# Patient Record
Sex: Female | Born: 1935 | Race: Black or African American | Hispanic: No | Smoking: Former smoker
Health system: Southern US, Community
[De-identification: ages and names within clinical notes are randomized; demographics above are authoritative.]

## PROBLEM LIST (undated history)

## (undated) DIAGNOSIS — K8050 Calculus of bile duct without cholangitis or cholecystitis without obstruction: Secondary | ICD-10-CM

## (undated) DIAGNOSIS — M1990 Unspecified osteoarthritis, unspecified site: Secondary | ICD-10-CM

## (undated) DIAGNOSIS — G8929 Other chronic pain: Secondary | ICD-10-CM

## (undated) DIAGNOSIS — M25561 Pain in right knee: Secondary | ICD-10-CM

## (undated) DIAGNOSIS — N189 Chronic kidney disease, unspecified: Secondary | ICD-10-CM

## (undated) DIAGNOSIS — R252 Cramp and spasm: Secondary | ICD-10-CM

## (undated) DIAGNOSIS — M109 Gout, unspecified: Secondary | ICD-10-CM

## (undated) DIAGNOSIS — M549 Dorsalgia, unspecified: Secondary | ICD-10-CM

## (undated) DIAGNOSIS — K219 Gastro-esophageal reflux disease without esophagitis: Secondary | ICD-10-CM

## (undated) DIAGNOSIS — M4800 Spinal stenosis, site unspecified: Secondary | ICD-10-CM

## (undated) DIAGNOSIS — M25562 Pain in left knee: Secondary | ICD-10-CM

## (undated) DIAGNOSIS — G5603 Carpal tunnel syndrome, bilateral upper limbs: Secondary | ICD-10-CM

## (undated) DIAGNOSIS — I10 Essential (primary) hypertension: Secondary | ICD-10-CM

## (undated) HISTORY — PX: CARPAL TUNNEL RELEASE: SHX101

## (undated) HISTORY — DX: Chronic kidney disease, unspecified: N18.9

## (undated) HISTORY — PX: OTHER SURGICAL HISTORY: SHX169

## (undated) HISTORY — PX: WRIST ARTHROPLASTY: SHX1088

## (undated) HISTORY — PX: CHOLECYSTECTOMY: SHX55

## (undated) HISTORY — DX: Spinal stenosis, site unspecified: M48.00

## (undated) HISTORY — PX: CATARACT EXTRACTION: SUR2

## (undated) HISTORY — DX: Essential (primary) hypertension: I10

## (undated) HISTORY — PX: APPENDECTOMY: SHX54

## (undated) HISTORY — DX: Gastro-esophageal reflux disease without esophagitis: K21.9

## (undated) HISTORY — PX: BREAST BIOPSY: SHX20

---

## 2000-06-14 ENCOUNTER — Inpatient Hospital Stay (HOSPITAL_COMMUNITY): Admission: AD | Admit: 2000-06-14 | Discharge: 2000-06-20 | Payer: Self-pay | Admitting: Family Medicine

## 2000-06-14 ENCOUNTER — Encounter: Payer: Self-pay | Admitting: Family Medicine

## 2000-06-17 ENCOUNTER — Encounter: Payer: Self-pay | Admitting: Family Medicine

## 2000-06-17 ENCOUNTER — Encounter: Payer: Self-pay | Admitting: General Surgery

## 2000-09-29 ENCOUNTER — Inpatient Hospital Stay (HOSPITAL_COMMUNITY): Admission: EM | Admit: 2000-09-29 | Discharge: 2000-10-09 | Payer: Self-pay | Admitting: Emergency Medicine

## 2000-09-29 ENCOUNTER — Encounter: Payer: Self-pay | Admitting: *Deleted

## 2000-10-01 ENCOUNTER — Encounter: Payer: Self-pay | Admitting: Pulmonary Disease

## 2000-10-02 ENCOUNTER — Encounter: Payer: Self-pay | Admitting: *Deleted

## 2000-10-03 ENCOUNTER — Encounter: Payer: Self-pay | Admitting: Orthopedic Surgery

## 2000-10-09 ENCOUNTER — Inpatient Hospital Stay (HOSPITAL_COMMUNITY)
Admission: RE | Admit: 2000-10-09 | Discharge: 2000-10-15 | Payer: Self-pay | Admitting: Physical Medicine & Rehabilitation

## 2002-04-02 ENCOUNTER — Emergency Department (HOSPITAL_COMMUNITY): Admission: EM | Admit: 2002-04-02 | Discharge: 2002-04-03 | Payer: Self-pay | Admitting: Emergency Medicine

## 2002-04-14 ENCOUNTER — Ambulatory Visit (HOSPITAL_COMMUNITY): Admission: RE | Admit: 2002-04-14 | Discharge: 2002-04-14 | Payer: Self-pay | Admitting: Family Medicine

## 2002-04-14 ENCOUNTER — Encounter: Payer: Self-pay | Admitting: Family Medicine

## 2002-09-17 ENCOUNTER — Encounter: Payer: Self-pay | Admitting: Rheumatology

## 2002-09-17 ENCOUNTER — Encounter (HOSPITAL_COMMUNITY): Admission: RE | Admit: 2002-09-17 | Discharge: 2002-10-17 | Payer: Self-pay | Admitting: Rheumatology

## 2002-12-07 ENCOUNTER — Encounter: Payer: Self-pay | Admitting: Family Medicine

## 2002-12-07 ENCOUNTER — Ambulatory Visit (HOSPITAL_COMMUNITY): Admission: RE | Admit: 2002-12-07 | Discharge: 2002-12-07 | Payer: Self-pay | Admitting: Family Medicine

## 2003-06-07 ENCOUNTER — Ambulatory Visit (HOSPITAL_COMMUNITY): Admission: RE | Admit: 2003-06-07 | Discharge: 2003-06-07 | Payer: Self-pay | Admitting: Family Medicine

## 2003-06-22 ENCOUNTER — Ambulatory Visit (HOSPITAL_COMMUNITY): Admission: RE | Admit: 2003-06-22 | Discharge: 2003-06-22 | Payer: Self-pay | Admitting: Obstetrics & Gynecology

## 2003-07-22 ENCOUNTER — Ambulatory Visit (HOSPITAL_COMMUNITY): Admission: RE | Admit: 2003-07-22 | Discharge: 2003-07-22 | Payer: Self-pay | Admitting: General Surgery

## 2003-12-14 ENCOUNTER — Ambulatory Visit (HOSPITAL_COMMUNITY): Admission: RE | Admit: 2003-12-14 | Discharge: 2003-12-14 | Payer: Self-pay | Admitting: Ophthalmology

## 2004-03-13 ENCOUNTER — Ambulatory Visit (HOSPITAL_COMMUNITY): Admission: RE | Admit: 2004-03-13 | Discharge: 2004-03-13 | Payer: Self-pay | Admitting: Family Medicine

## 2005-02-02 ENCOUNTER — Ambulatory Visit (HOSPITAL_COMMUNITY): Admission: RE | Admit: 2005-02-02 | Discharge: 2005-02-02 | Payer: Self-pay | Admitting: Family Medicine

## 2005-08-28 ENCOUNTER — Encounter (INDEPENDENT_AMBULATORY_CARE_PROVIDER_SITE_OTHER): Payer: Self-pay | Admitting: Internal Medicine

## 2005-08-29 ENCOUNTER — Encounter (INDEPENDENT_AMBULATORY_CARE_PROVIDER_SITE_OTHER): Payer: Self-pay | Admitting: Internal Medicine

## 2005-10-29 ENCOUNTER — Ambulatory Visit: Payer: Self-pay | Admitting: Internal Medicine

## 2005-10-31 ENCOUNTER — Ambulatory Visit (HOSPITAL_COMMUNITY): Admission: RE | Admit: 2005-10-31 | Discharge: 2005-10-31 | Payer: Self-pay | Admitting: Internal Medicine

## 2005-10-31 ENCOUNTER — Ambulatory Visit (HOSPITAL_COMMUNITY): Admission: RE | Admit: 2005-10-31 | Discharge: 2005-10-31 | Payer: Self-pay | Admitting: Family Medicine

## 2005-11-07 ENCOUNTER — Ambulatory Visit: Payer: Self-pay | Admitting: Internal Medicine

## 2005-11-21 ENCOUNTER — Ambulatory Visit: Payer: Self-pay | Admitting: Internal Medicine

## 2005-12-04 ENCOUNTER — Ambulatory Visit: Payer: Self-pay | Admitting: Internal Medicine

## 2005-12-06 ENCOUNTER — Encounter (INDEPENDENT_AMBULATORY_CARE_PROVIDER_SITE_OTHER): Payer: Self-pay | Admitting: Internal Medicine

## 2005-12-10 ENCOUNTER — Ambulatory Visit: Payer: Self-pay | Admitting: Internal Medicine

## 2005-12-18 ENCOUNTER — Ambulatory Visit: Payer: Self-pay | Admitting: Internal Medicine

## 2006-01-04 ENCOUNTER — Encounter: Payer: Self-pay | Admitting: Internal Medicine

## 2006-01-04 DIAGNOSIS — M4800 Spinal stenosis, site unspecified: Secondary | ICD-10-CM

## 2006-01-04 DIAGNOSIS — M129 Arthropathy, unspecified: Secondary | ICD-10-CM

## 2006-01-04 DIAGNOSIS — E785 Hyperlipidemia, unspecified: Secondary | ICD-10-CM

## 2006-01-04 DIAGNOSIS — M810 Age-related osteoporosis without current pathological fracture: Secondary | ICD-10-CM | POA: Insufficient documentation

## 2006-01-04 DIAGNOSIS — M109 Gout, unspecified: Secondary | ICD-10-CM | POA: Insufficient documentation

## 2006-01-04 DIAGNOSIS — J40 Bronchitis, not specified as acute or chronic: Secondary | ICD-10-CM | POA: Insufficient documentation

## 2006-01-04 DIAGNOSIS — I10 Essential (primary) hypertension: Secondary | ICD-10-CM | POA: Insufficient documentation

## 2006-01-04 DIAGNOSIS — G5600 Carpal tunnel syndrome, unspecified upper limb: Secondary | ICD-10-CM

## 2006-01-04 DIAGNOSIS — H269 Unspecified cataract: Secondary | ICD-10-CM

## 2006-01-17 ENCOUNTER — Ambulatory Visit: Payer: Self-pay | Admitting: Internal Medicine

## 2006-02-12 ENCOUNTER — Ambulatory Visit (HOSPITAL_COMMUNITY): Admission: RE | Admit: 2006-02-12 | Discharge: 2006-02-12 | Payer: Self-pay | Admitting: Internal Medicine

## 2006-02-12 ENCOUNTER — Ambulatory Visit: Payer: Self-pay | Admitting: Internal Medicine

## 2006-03-20 ENCOUNTER — Ambulatory Visit: Payer: Self-pay | Admitting: Internal Medicine

## 2006-04-17 ENCOUNTER — Ambulatory Visit: Payer: Self-pay | Admitting: Internal Medicine

## 2006-04-17 DIAGNOSIS — R0902 Hypoxemia: Secondary | ICD-10-CM

## 2006-04-22 LAB — CONVERTED CEMR LAB
Albumin: 4.1 g/dL (ref 3.5–5.2)
Alkaline Phosphatase: 126 units/L — ABNORMAL HIGH (ref 39–117)
BUN: 11 mg/dL (ref 6–23)
CO2: 31 meq/L (ref 19–32)
Calcium: 9.2 mg/dL (ref 8.4–10.5)
Cholesterol: 196 mg/dL (ref 0–200)
Glucose, Bld: 98 mg/dL (ref 70–99)
HDL: 73 mg/dL (ref >39)
Sodium: 145 meq/L (ref 135–145)
Total CHOL/HDL Ratio: 2.7
Total Protein: 7.3 g/dL (ref 6.0–8.3)
Triglycerides: 139 mg/dL (ref <150)
VLDL: 28 mg/dL (ref 0–40)

## 2006-05-16 ENCOUNTER — Ambulatory Visit: Payer: Self-pay | Admitting: Internal Medicine

## 2006-05-16 DIAGNOSIS — R209 Unspecified disturbances of skin sensation: Secondary | ICD-10-CM

## 2006-05-16 DIAGNOSIS — K5900 Constipation, unspecified: Secondary | ICD-10-CM | POA: Insufficient documentation

## 2006-05-20 ENCOUNTER — Encounter (INDEPENDENT_AMBULATORY_CARE_PROVIDER_SITE_OTHER): Payer: Self-pay | Admitting: Internal Medicine

## 2006-05-20 ENCOUNTER — Ambulatory Visit (HOSPITAL_COMMUNITY): Admission: RE | Admit: 2006-05-20 | Discharge: 2006-05-20 | Payer: Self-pay | Admitting: Internal Medicine

## 2006-05-30 ENCOUNTER — Encounter (INDEPENDENT_AMBULATORY_CARE_PROVIDER_SITE_OTHER): Payer: Self-pay | Admitting: Internal Medicine

## 2006-06-14 ENCOUNTER — Ambulatory Visit: Payer: Self-pay | Admitting: Internal Medicine

## 2006-06-14 DIAGNOSIS — E876 Hypokalemia: Secondary | ICD-10-CM

## 2006-06-18 ENCOUNTER — Encounter (INDEPENDENT_AMBULATORY_CARE_PROVIDER_SITE_OTHER): Payer: Self-pay | Admitting: Internal Medicine

## 2006-06-18 LAB — CONVERTED CEMR LAB
CO2: 30 meq/L (ref 19–32)
Chloride: 97 meq/L (ref 96–112)
Creatinine, Ser: 1.15 mg/dL (ref 0.40–1.20)
TSH: 3.744 microintl units/mL (ref 0.350–5.50)

## 2006-06-25 ENCOUNTER — Encounter (INDEPENDENT_AMBULATORY_CARE_PROVIDER_SITE_OTHER): Payer: Self-pay | Admitting: Specialist

## 2006-06-25 ENCOUNTER — Ambulatory Visit (HOSPITAL_COMMUNITY): Admission: RE | Admit: 2006-06-25 | Discharge: 2006-06-25 | Payer: Self-pay | Admitting: Orthopaedic Surgery

## 2006-07-12 ENCOUNTER — Ambulatory Visit: Payer: Self-pay | Admitting: Internal Medicine

## 2006-08-12 ENCOUNTER — Ambulatory Visit: Payer: Self-pay | Admitting: Internal Medicine

## 2006-09-03 ENCOUNTER — Ambulatory Visit: Payer: Self-pay | Admitting: Internal Medicine

## 2006-09-03 ENCOUNTER — Ambulatory Visit (HOSPITAL_COMMUNITY): Admission: RE | Admit: 2006-09-03 | Discharge: 2006-09-03 | Payer: Self-pay | Admitting: Internal Medicine

## 2006-09-03 DIAGNOSIS — R945 Abnormal results of liver function studies: Secondary | ICD-10-CM

## 2006-09-03 DIAGNOSIS — R091 Pleurisy: Secondary | ICD-10-CM | POA: Insufficient documentation

## 2006-09-03 LAB — CONVERTED CEMR LAB
ALT: 89 units/L — ABNORMAL HIGH (ref 0–35)
AST: 56 units/L — ABNORMAL HIGH (ref 0–37)
Alkaline Phosphatase: 131 units/L — ABNORMAL HIGH (ref 39–117)
BUN: 7 mg/dL (ref 6–23)
Basophils Absolute: 0 10*3/uL (ref 0.0–0.1)
Bilirubin Urine: NEGATIVE
Blood in Urine, dipstick: NEGATIVE
Eosinophils Absolute: 0.1 10*3/uL (ref 0.0–0.7)
Eosinophils Relative: 1 % (ref 0–5)
Glucose, Bld: 101 mg/dL — ABNORMAL HIGH (ref 70–99)
HCT: 43.7 % (ref 36.0–46.0)
Lymphocytes Relative: 17 % (ref 12–46)
MCHC: 33.6 g/dL (ref 30.0–36.0)
MCV: 82.3 fL (ref 78.0–100.0)
Monocytes Absolute: 1 10*3/uL — ABNORMAL HIGH (ref 0.2–0.7)
Nitrite: NEGATIVE
Platelets: 246 10*3/uL (ref 150–400)
Protein, U semiquant: NEGATIVE
RBC: 5.32 M/uL — ABNORMAL HIGH (ref 3.87–5.11)
RDW: 16.1 % — ABNORMAL HIGH (ref 11.5–14.0)
Sodium: 140 meq/L (ref 135–145)
Specific Gravity, Urine: 1.01
Total Protein: 7.6 g/dL (ref 6.0–8.3)
Troponin I: 0.02 ng/mL (ref <0.06)
Urobilinogen, UA: 0.2

## 2006-09-04 ENCOUNTER — Encounter (INDEPENDENT_AMBULATORY_CARE_PROVIDER_SITE_OTHER): Payer: Self-pay | Admitting: Internal Medicine

## 2006-09-11 ENCOUNTER — Telehealth (INDEPENDENT_AMBULATORY_CARE_PROVIDER_SITE_OTHER): Payer: Self-pay | Admitting: *Deleted

## 2006-10-08 ENCOUNTER — Ambulatory Visit: Payer: Self-pay | Admitting: Internal Medicine

## 2006-11-12 ENCOUNTER — Telehealth (INDEPENDENT_AMBULATORY_CARE_PROVIDER_SITE_OTHER): Payer: Self-pay | Admitting: *Deleted

## 2006-11-21 ENCOUNTER — Encounter (INDEPENDENT_AMBULATORY_CARE_PROVIDER_SITE_OTHER): Payer: Self-pay | Admitting: Internal Medicine

## 2006-12-10 ENCOUNTER — Ambulatory Visit: Payer: Self-pay | Admitting: Internal Medicine

## 2006-12-11 ENCOUNTER — Telehealth (INDEPENDENT_AMBULATORY_CARE_PROVIDER_SITE_OTHER): Payer: Self-pay | Admitting: *Deleted

## 2006-12-11 LAB — CONVERTED CEMR LAB
AST: 22 units/L (ref 0–37)
Albumin: 4.1 g/dL (ref 3.5–5.2)
Alkaline Phosphatase: 110 units/L (ref 39–117)
Total Protein: 7.3 g/dL (ref 6.0–8.3)

## 2006-12-17 ENCOUNTER — Telehealth (INDEPENDENT_AMBULATORY_CARE_PROVIDER_SITE_OTHER): Payer: Self-pay | Admitting: Internal Medicine

## 2007-01-13 ENCOUNTER — Telehealth (INDEPENDENT_AMBULATORY_CARE_PROVIDER_SITE_OTHER): Payer: Self-pay | Admitting: Internal Medicine

## 2007-02-10 ENCOUNTER — Ambulatory Visit: Payer: Self-pay | Admitting: Internal Medicine

## 2007-03-13 ENCOUNTER — Telehealth (INDEPENDENT_AMBULATORY_CARE_PROVIDER_SITE_OTHER): Payer: Self-pay | Admitting: Internal Medicine

## 2007-04-14 ENCOUNTER — Ambulatory Visit: Payer: Self-pay | Admitting: Internal Medicine

## 2007-04-15 ENCOUNTER — Telehealth (INDEPENDENT_AMBULATORY_CARE_PROVIDER_SITE_OTHER): Payer: Self-pay | Admitting: *Deleted

## 2007-04-15 ENCOUNTER — Telehealth (INDEPENDENT_AMBULATORY_CARE_PROVIDER_SITE_OTHER): Payer: Self-pay | Admitting: Internal Medicine

## 2007-04-15 ENCOUNTER — Encounter (INDEPENDENT_AMBULATORY_CARE_PROVIDER_SITE_OTHER): Payer: Self-pay | Admitting: Internal Medicine

## 2007-04-15 LAB — CONVERTED CEMR LAB
ALT: 11 units/L (ref 0–35)
AST: 20 units/L (ref 0–37)
Albumin: 3.9 g/dL (ref 3.5–5.2)
CO2: 28 meq/L (ref 19–32)
Chloride: 102 meq/L (ref 96–112)
Cholesterol: 187 mg/dL (ref 0–200)
Glucose, Bld: 96 mg/dL (ref 70–99)
Sodium: 145 meq/L (ref 135–145)
Triglycerides: 181 mg/dL — ABNORMAL HIGH (ref <150)

## 2007-05-12 ENCOUNTER — Telehealth (INDEPENDENT_AMBULATORY_CARE_PROVIDER_SITE_OTHER): Payer: Self-pay | Admitting: Internal Medicine

## 2007-06-04 ENCOUNTER — Encounter (INDEPENDENT_AMBULATORY_CARE_PROVIDER_SITE_OTHER): Payer: Self-pay | Admitting: Internal Medicine

## 2007-06-09 ENCOUNTER — Ambulatory Visit: Payer: Self-pay | Admitting: Internal Medicine

## 2007-06-09 DIAGNOSIS — G8929 Other chronic pain: Secondary | ICD-10-CM

## 2007-06-09 DIAGNOSIS — M25562 Pain in left knee: Secondary | ICD-10-CM

## 2007-07-07 ENCOUNTER — Ambulatory Visit: Payer: Self-pay | Admitting: Internal Medicine

## 2007-08-04 ENCOUNTER — Ambulatory Visit: Payer: Self-pay | Admitting: Internal Medicine

## 2007-08-04 LAB — CONVERTED CEMR LAB
Blood in Urine, dipstick: NEGATIVE
Ketones, urine, test strip: NEGATIVE
Nitrite: NEGATIVE
Urobilinogen, UA: 0.2
WBC Urine, dipstick: NEGATIVE

## 2007-08-05 ENCOUNTER — Telehealth (INDEPENDENT_AMBULATORY_CARE_PROVIDER_SITE_OTHER): Payer: Self-pay | Admitting: Internal Medicine

## 2007-08-07 ENCOUNTER — Emergency Department (HOSPITAL_COMMUNITY): Admission: EM | Admit: 2007-08-07 | Discharge: 2007-08-07 | Payer: Self-pay | Admitting: Emergency Medicine

## 2007-08-07 ENCOUNTER — Telehealth (INDEPENDENT_AMBULATORY_CARE_PROVIDER_SITE_OTHER): Payer: Self-pay | Admitting: Internal Medicine

## 2007-09-02 ENCOUNTER — Ambulatory Visit: Payer: Self-pay | Admitting: Internal Medicine

## 2007-09-16 ENCOUNTER — Encounter (INDEPENDENT_AMBULATORY_CARE_PROVIDER_SITE_OTHER): Payer: Self-pay | Admitting: Internal Medicine

## 2007-09-26 ENCOUNTER — Emergency Department (HOSPITAL_COMMUNITY): Admission: EM | Admit: 2007-09-26 | Discharge: 2007-09-26 | Payer: Self-pay | Admitting: Emergency Medicine

## 2007-10-01 ENCOUNTER — Ambulatory Visit: Payer: Self-pay | Admitting: Internal Medicine

## 2007-10-03 ENCOUNTER — Telehealth (INDEPENDENT_AMBULATORY_CARE_PROVIDER_SITE_OTHER): Payer: Self-pay | Admitting: Internal Medicine

## 2007-10-20 ENCOUNTER — Ambulatory Visit: Payer: Self-pay | Admitting: Internal Medicine

## 2007-11-03 ENCOUNTER — Ambulatory Visit: Payer: Self-pay | Admitting: Internal Medicine

## 2007-11-25 ENCOUNTER — Ambulatory Visit: Payer: Self-pay | Admitting: Internal Medicine

## 2007-12-09 ENCOUNTER — Ambulatory Visit: Payer: Self-pay | Admitting: Internal Medicine

## 2007-12-30 ENCOUNTER — Ambulatory Visit: Payer: Self-pay | Admitting: Internal Medicine

## 2007-12-31 ENCOUNTER — Encounter (INDEPENDENT_AMBULATORY_CARE_PROVIDER_SITE_OTHER): Payer: Self-pay | Admitting: Internal Medicine

## 2007-12-31 ENCOUNTER — Ambulatory Visit (HOSPITAL_COMMUNITY): Admission: RE | Admit: 2007-12-31 | Discharge: 2007-12-31 | Payer: Self-pay | Admitting: Internal Medicine

## 2008-01-13 ENCOUNTER — Ambulatory Visit: Payer: Self-pay | Admitting: Internal Medicine

## 2008-02-03 ENCOUNTER — Ambulatory Visit: Payer: Self-pay | Admitting: Internal Medicine

## 2008-02-17 ENCOUNTER — Ambulatory Visit: Payer: Self-pay | Admitting: Internal Medicine

## 2008-02-17 DIAGNOSIS — J069 Acute upper respiratory infection, unspecified: Secondary | ICD-10-CM | POA: Insufficient documentation

## 2008-03-09 ENCOUNTER — Ambulatory Visit: Payer: Self-pay | Admitting: Internal Medicine

## 2008-04-12 ENCOUNTER — Telehealth (INDEPENDENT_AMBULATORY_CARE_PROVIDER_SITE_OTHER): Payer: Self-pay | Admitting: Internal Medicine

## 2008-04-28 ENCOUNTER — Ambulatory Visit: Payer: Self-pay | Admitting: Internal Medicine

## 2008-04-29 ENCOUNTER — Encounter (INDEPENDENT_AMBULATORY_CARE_PROVIDER_SITE_OTHER): Payer: Self-pay | Admitting: Internal Medicine

## 2008-04-29 LAB — CONVERTED CEMR LAB
Alkaline Phosphatase: 96 units/L (ref 39–117)
Basophils Absolute: 0 10*3/uL (ref 0.0–0.1)
Basophils Relative: 0 % (ref 0–1)
CO2: 24 meq/L (ref 19–32)
Calcium: 9.2 mg/dL (ref 8.4–10.5)
Chloride: 101 meq/L (ref 96–112)
Cholesterol: 211 mg/dL — ABNORMAL HIGH (ref 0–200)
Creatinine,U: 94.1 mg/dL
Eosinophils Absolute: 0.2 10*3/uL (ref 0.0–0.7)
Eosinophils Relative: 3 % (ref 0–5)
Ethyl Alcohol: <10 mg/dL (ref <10)
HCT: 42 % (ref 36.0–46.0)
Hemoglobin: 14 g/dL (ref 12.0–15.0)
LDL Cholesterol: 110 mg/dL — ABNORMAL HIGH (ref 0–99)
Lymphocytes Relative: 21 % (ref 12–46)
MCHC: 33.3 g/dL (ref 30.0–36.0)
MCV: 82.8 fL (ref 78.0–100.0)
Marijuana Metabolite: NEGATIVE
Monocytes Absolute: 1.1 10*3/uL — ABNORMAL HIGH (ref 0.1–1.0)
Neutro Abs: 6.1 10*3/uL (ref 1.7–7.7)
Opiate Screen, Urine: POSITIVE — AB
Potassium: 3.5 meq/L (ref 3.5–5.3)
Propoxyphene: NEGATIVE
RBC: 5.07 M/uL (ref 3.87–5.11)
Total Bilirubin: 0.4 mg/dL (ref 0.3–1.2)
Triglycerides: 133 mg/dL (ref <150)
VLDL: 27 mg/dL (ref 0–40)

## 2008-05-07 ENCOUNTER — Telehealth (INDEPENDENT_AMBULATORY_CARE_PROVIDER_SITE_OTHER): Payer: Self-pay | Admitting: Internal Medicine

## 2008-06-10 ENCOUNTER — Ambulatory Visit: Payer: Self-pay | Admitting: Internal Medicine

## 2008-08-12 ENCOUNTER — Ambulatory Visit: Payer: Self-pay | Admitting: Internal Medicine

## 2008-09-10 ENCOUNTER — Telehealth (INDEPENDENT_AMBULATORY_CARE_PROVIDER_SITE_OTHER): Payer: Self-pay | Admitting: *Deleted

## 2008-10-12 ENCOUNTER — Telehealth (INDEPENDENT_AMBULATORY_CARE_PROVIDER_SITE_OTHER): Payer: Self-pay | Admitting: Internal Medicine

## 2008-11-01 ENCOUNTER — Ambulatory Visit: Payer: Self-pay | Admitting: Internal Medicine

## 2008-11-01 LAB — CONVERTED CEMR LAB
Amphetamine Screen, Ur: NEGATIVE
Benzodiazepines.: NEGATIVE
Methadone: NEGATIVE
Opiates: NEGATIVE
Propoxyphene: NEGATIVE

## 2008-11-02 ENCOUNTER — Encounter (INDEPENDENT_AMBULATORY_CARE_PROVIDER_SITE_OTHER): Payer: Self-pay | Admitting: Internal Medicine

## 2008-11-02 LAB — CONVERTED CEMR LAB
ALT: 15 units/L (ref 0–35)
BUN: 17 mg/dL (ref 6–23)
Cholesterol: 239 mg/dL — ABNORMAL HIGH (ref 0–200)
Creatinine, Ser: 1.18 mg/dL (ref 0.40–1.20)
Potassium: 4 meq/L (ref 3.5–5.3)
Total Protein: 7.8 g/dL (ref 6.0–8.3)
Triglycerides: 122 mg/dL (ref <150)

## 2008-12-06 ENCOUNTER — Encounter (INDEPENDENT_AMBULATORY_CARE_PROVIDER_SITE_OTHER): Payer: Self-pay | Admitting: Internal Medicine

## 2008-12-31 ENCOUNTER — Ambulatory Visit (HOSPITAL_COMMUNITY): Admission: RE | Admit: 2008-12-31 | Discharge: 2008-12-31 | Payer: Self-pay | Admitting: Internal Medicine

## 2009-04-20 ENCOUNTER — Ambulatory Visit (HOSPITAL_COMMUNITY): Admission: RE | Admit: 2009-04-20 | Discharge: 2009-04-20 | Payer: Self-pay | Admitting: Internal Medicine

## 2009-08-09 ENCOUNTER — Ambulatory Visit (HOSPITAL_COMMUNITY): Admission: RE | Admit: 2009-08-09 | Discharge: 2009-08-09 | Payer: Self-pay | Admitting: Internal Medicine

## 2010-02-10 ENCOUNTER — Encounter: Payer: Self-pay | Admitting: Family Medicine

## 2010-03-26 ENCOUNTER — Encounter: Payer: Self-pay | Admitting: Orthopaedic Surgery

## 2010-04-02 ENCOUNTER — Emergency Department (HOSPITAL_COMMUNITY)
Admission: EM | Admit: 2010-04-02 | Discharge: 2010-04-02 | Payer: Self-pay | Source: Home / Self Care | Admitting: Emergency Medicine

## 2010-04-04 NOTE — Letter (Signed)
Summary: chart review  chart review   Imported By: Lind Guest 02/10/2010 14:43:34  _____________________________________________________________________  External Attachment:    Type:   Image     Comment:   External Document

## 2010-05-22 LAB — BLOOD GAS, ARTERIAL
Acid-Base Excess: 5.1 mmol/L — ABNORMAL HIGH (ref 0.0–2.0)
Bicarbonate: 29.6 mEq/L — ABNORMAL HIGH (ref 20.0–24.0)
FIO2: 21 %
O2 Saturation: 90.9 %
Patient temperature: 37
TCO2: 25.9 mmol/L (ref 0–100)
pCO2 arterial: 47.8 mmHg — ABNORMAL HIGH (ref 35.0–45.0)
pH, Arterial: 7.408 — ABNORMAL HIGH (ref 7.350–7.400)
pO2, Arterial: 61 mmHg — ABNORMAL LOW (ref 80.0–100.0)

## 2010-06-08 ENCOUNTER — Inpatient Hospital Stay (HOSPITAL_COMMUNITY)
Admission: EM | Admit: 2010-06-08 | Discharge: 2010-06-14 | DRG: 552 | Disposition: A | Discharge status: Home Health | Payer: Medicare Other | Source: Ambulatory Visit | Attending: Internal Medicine | Admitting: Internal Medicine

## 2010-06-08 ENCOUNTER — Emergency Department (HOSPITAL_COMMUNITY): Payer: Medicare Other

## 2010-06-08 DIAGNOSIS — M109 Gout, unspecified: Secondary | ICD-10-CM | POA: Diagnosis present

## 2010-06-08 DIAGNOSIS — S32009A Unspecified fracture of unspecified lumbar vertebra, initial encounter for closed fracture: Principal | ICD-10-CM | POA: Diagnosis present

## 2010-06-08 DIAGNOSIS — M810 Age-related osteoporosis without current pathological fracture: Secondary | ICD-10-CM | POA: Diagnosis present

## 2010-06-08 DIAGNOSIS — W19XXXA Unspecified fall, initial encounter: Secondary | ICD-10-CM | POA: Diagnosis present

## 2010-06-08 DIAGNOSIS — I10 Essential (primary) hypertension: Secondary | ICD-10-CM | POA: Diagnosis present

## 2010-06-08 DIAGNOSIS — G894 Chronic pain syndrome: Secondary | ICD-10-CM | POA: Diagnosis present

## 2010-06-08 LAB — BLOOD GAS, ARTERIAL
FIO2: 0.21 %
O2 Saturation: 92.6 %
pCO2 arterial: 45.8 mmHg — ABNORMAL HIGH (ref 35.0–45.0)

## 2010-06-08 LAB — POCT I-STAT, CHEM 8
Chloride: 104 mEq/L (ref 96–112)
Potassium: 4.4 mEq/L (ref 3.5–5.1)
TCO2: 28 mmol/L (ref 0–100)

## 2010-06-08 MED ORDER — IOHEXOL 300 MG/ML  SOLN
100.0000 mL | Freq: Once | INTRAMUSCULAR | Status: AC | PRN
  Administered 2010-06-08: 100 mL via INTRAVENOUS

## 2010-06-09 LAB — DIFFERENTIAL
Basophils Relative: 0 % (ref 0–1)
Eosinophils Absolute: 0.3 10*3/uL (ref 0.0–0.7)
Eosinophils Relative: 3 % (ref 0–5)
Lymphocytes Relative: 18 % (ref 12–46)
Lymphs Abs: 1.8 10*3/uL (ref 0.7–4.0)
Monocytes Relative: 14 % — ABNORMAL HIGH (ref 3–12)
Neutro Abs: 6.2 10*3/uL (ref 1.7–7.7)

## 2010-06-09 LAB — COMPREHENSIVE METABOLIC PANEL
Albumin: 3.2 g/dL — ABNORMAL LOW (ref 3.5–5.2)
Alkaline Phosphatase: 74 U/L (ref 39–117)
BUN: 5 mg/dL — ABNORMAL LOW (ref 6–23)
Calcium: 8.7 mg/dL (ref 8.4–10.5)
Potassium: 4.1 mEq/L (ref 3.5–5.1)
Sodium: 143 mEq/L (ref 135–145)
Total Bilirubin: 0.4 mg/dL (ref 0.3–1.2)
Total Protein: 6 g/dL (ref 6.0–8.3)

## 2010-06-09 LAB — CBC
Hemoglobin: 12.4 g/dL (ref 12.0–15.0)
MCH: 27.9 pg (ref 26.0–34.0)
MCHC: 33 g/dL (ref 30.0–36.0)
MCV: 84.7 fL (ref 78.0–100.0)
Platelets: 222 10*3/uL (ref 150–400)

## 2010-06-11 NOTE — Progress Notes (Signed)
  NAME:  Lori Bishop, Lori Bishop                ACCOUNT NO.:  [id number]  MEDICAL RECORD NO.:  [id number]           PATIENT TYPE:  I  LOCATION:  A315                          FACILITY:  [facility]  PHYSICIAN:  Kingsley Callander. Ouida Sills, MD       DATE OF BIRTH:  12-10-1920  DATE OF PROCEDURE:  06/10/2010 DATE OF DISCHARGE:                                PROGRESS NOTE   HISTORY OF PRESENT ILLNESS:  Lori Bishop was admitted to Dr. Letitia Neri service on the 5th after falling at home in her kitchen.  She fell and hit a paint can.  She suffered fractures of the left L1, L2, and L3 transverse processes.  She also had an abnormal appearance of her T11 vertebra.  She has been seen by Dr. Hilda Lias, an orthopedic consultation. The cast brace has been ordered.  Physical therapist has arrived this morning to assist her with this.  She rates her pain at a 7-8 now.  It has been a 9 earlier.  She has a Duragesic patch and has received morphine p.r.n.  PHYSICAL EXAMINATION:  VITAL SIGNS:  Her vital signs are normal.  Her temperature is 97.6, pulse 60, respirations 16, blood pressure 128/57, and oxygen saturation 92%. GENERAL:  She is alert and comfortable appearing. LUNGS:  Clear. HEART:  Regular with no murmurs. ABDOMEN:  Nontender. BACK:  A large bruise on the left flank. EXTREMITIES:  She has good movement and strength in her legs and feet.  IMPRESSION/PLAN:  Lumbar fractures.  Continue physical therapy.  She will use air cast brace.  Continue fentanyl and morphine.  She is on DVT prophylaxis with Lovenox.     Kingsley Callander. Ouida Sills, MD     ROF/MEDQ  D:  06/10/2010  T:  06/10/2010  Job:  [id number]  Electronically Signed by Carylon Perches MD on 06/11/2010 09:35:41 AM

## 2010-06-15 NOTE — Progress Notes (Signed)
  NAME:  Lori Bishop, WAYNE NO.:  [id number]  MEDICAL RECORD NO.:  [id number]           PATIENT TYPE:  LOCATION:                                 FACILITY:  PHYSICIAN:  Kingsley Callander. Ouida Sills, MD       DATE OF BIRTH:  [birth year]  DATE OF PROCEDURE: DATE OF DISCHARGE:                                PROGRESS NOTE   HISTORY OF PRESENT ILLNESS:  [name] complains of her pain medication not lasting for 6 hours.  She would like to receive it approximately every 4 hours.  She was able to walk with some assistance yesterday. She has had some discomfort overnight.  Fentanyl patches in place.  PHYSICAL EXAMINATION:  VITAL SIGNS:  Temperature 98.3, pulse 59, and blood pressure 166/74 last night. LUNGS:  Clear. HEART:  Regular with no murmurs. ABDOMEN:  Nontender.  No hepatosplenomegaly. EXTREMITIES:  Good strength in her legs.  IMPRESSION/PLAN: 1. L1, L2, and L3 transverse process fractures.  Continue physical     therapy.  Increase Vicodin every 4 hours as needed. 2. Abnormal appearance of T11.  Further workup per Dr. Felecia Shelling. 3. Hypertension. 4. History of gout.     Kingsley Callander. Ouida Sills, MD     ROF/MEDQ  D:  06/12/2010  T:  06/12/2010  Job:  [id number]  Electronically Signed by Carylon Perches MD on 06/15/2010 07:48:48 AM

## 2010-06-15 NOTE — Progress Notes (Signed)
  NAME:  AMYRIE, ILLINGWORTH NO.:  [id number]  MEDICAL RECORD NO.:  [id number]          PATIENT TYPE:  LOCATION:                                 FACILITY:  PHYSICIAN:  Kingsley Callander. Ouida Sills, MD       DATE OF BIRTH:  Jun 24, 1920  DATE OF PROCEDURE:  06/11/2010 DATE OF DISCHARGE:                                PROGRESS NOTE   Ms. Nesbitt is beginning to feel more comfortable.  She was able to move much better with physical therapy yesterday.  She has had a dose of Vicodin this morning.  Vital signs revealed temperature of 98.0, pulse 65, blood pressure 154/50, respirations of 20. Lungs clear.  Heart regular with no murmurs.  She has good movement and good strength in her legs.  IMPRESSION/PLAN:  Lumbar fractures.  Continue physical therapy. Continue cast brace.  Continue p.r.n. Vicodin.     Kingsley Callander. Ouida Sills, MD     ROF/MEDQ  D:  06/11/2010  T:  06/11/2010  Job:  [id number]  Electronically Signed by Carylon Perches MD on 06/15/2010 07:48:47 AM

## 2010-06-21 NOTE — H&P (Signed)
Lori Bishop, Lori Bishop                ACCOUNT NO.:  [id number]  MEDICAL RECORD NO.:  [id number]           PATIENT TYPE:  I  LOCATION:  A315                          FACILITY:  [facility]  PHYSICIAN:  Zaiya Annunziato, MD DATE OF BIRTH:  Apr 07, 1920  DATE OF ADMISSION:  06/08/2010 DATE OF DISCHARGE:  LH                             HISTORY & PHYSICAL   CHIEF COMPLAINT:  Fall.  HISTORY OF PRESENT ILLNESS:  This is an 77 year old African American female with a medical history of significant for hypertension, ascites, chronic back pain, gouty arthritis, and osteoarthritis, admitted secondary to a fall at her kitchen today at 3:30 p.m., which resulted in fracture of vertebral processes of L1, L2, and L3 based on imaging study obtained at ER with CT of pelvis and abdomen.  The patient was trying to get some water in her kitchen.  At which time, she says her knees gave up and she fell on her left side, which started to hurt immediately.Denied any head injury.  No consciousness.  No open wound.  No bleeding. Now, she complained some bruise and abrasion at the area.  The patient uses normally a walker to get around.  Today, she was not using her walker at home.  There were no rugs or threatening objects and she says there was adequate light at home.  Her CT scan was reportedly reviewed by Orthopedic surgeon, who recommended according to the ER physician nonsurgical therapy at this point.  The patient is being admitted for pain control and stabilization.  PAST MEDICAL HISTORY:  As above, hypertension, gouty arthritis, chronic back pain.  PAST SURGICAL HISTORY:  Appendectomy, cholecystectomy, repair of right hip fracture.  SOCIAL HISTORY:  Negative for smoking, alcohol, or drug use.  HOME MEDICATIONS:  Include, allopurinol, Lasix, and fentanyl patch.  FAMILY HISTORY:  Noncontributory.  REVIEW OF SYMPTOMS:  No chest pain.  No dizziness.  No headache.  No loss of consciousness.  All the rest of  systems have been reviewed and negative.  PHYSICAL EXAMINATION:  GENERAL:  She is alert and oriented x3, in no acute distress. VITAL SIGNS:  Showed blood pressure 134/72, pulse rate 81, respiratory rate 18, SPO2 95% on room air. HEENT:  No pallor.  No icterus.  No head injury. NECK:  Negative for goiter or thyromegaly. CHEST:  Clear to auscultation bilaterally. CARDIOVASCULAR:  Normal S1 and S2.  No murmur.  No gallop. ABDOMEN:  Soft and nontender.  Bruits over left flank area, which is slightly swollen.  No open wound.  Moderately tender on reported vertebral transverse process fracture area. EXTREMITIES:  She moves extremities very well. SKIN:  Normal warms and texture.  Her imaging study involving CT of abdomen and pelvis showed fracture of left L1, L2, and L3 transverse process, abnormal transverse T11 vertebra.  Incidental finding of several common bile duct stones and mild prominence of pancreatic duct with dilated intrahepatic and extrahepatic bile ducts.  ASSESSMENT AND PLAN:  Fracture of left vertebral L1, L2, and L3 transverse process.  Plan is to admit patient for pain control and stabilization.  Fall precaution.  Continue home medications.  Consult Orthopedics.  DVT prophylaxis with Lovenox 40 mg subcu daily.  Obtain CBC and CMP in a.m.  Further care to be resumed by Dr. Felecia Shelling on June 09, 2010.                                           ______________________________ Purcell Nails, MD     GN/MEDQ  D:  06/08/2010  T:  06/09/2010  Job:  [id number]  Electronically Signed by Purcell Nails MD on 06/21/2010 11:21:01 AM

## 2010-06-28 NOTE — Discharge Summary (Signed)
  NAME:  Lori Bishop, Lori Bishop                ACCOUNT NO.:  [id number]  MEDICAL RECORD NO.:  [id number]           PATIENT TYPE:  I  LOCATION:  A315                          FACILITY:  [facility]  PHYSICIAN:  Aundray Cartlidge D. Felecia Shelling, MD   DATE OF BIRTH:  15-Dec-1920  DATE OF ADMISSION:  06/08/2010 DATE OF DISCHARGE:  04/11/2012LH                              DISCHARGE SUMMARY   DISCHARGE DIAGNOSES: 1. Accidental fall. 2. Fracture of L1, L2, L3 transverse vertebrae. 3. History of osteoporosis. 4. Hypertension. 5. Chronic pain syndrome. 6. Gouty arthritis.  DISCHARGE MEDICATIONS: 1. Vicodin 5/325 1 tablet p.o. q.6 h. p.r.n. 2. Allopurinol 100 mg p.o. daily. 3. Allegra 10 mg daily. 4. Demadex 20 mg p.o. daily. 5. Fentanyl patch 100 mcg per hour, change q.72 h.  DISPOSITION:  The patient will be discharged to home in stable condition.  DISCHARGE INSTRUCTIONS:  The patient is advised to continue her medications.  Home health will monitor the patient and sees her daily living activity.  The patient will be followed in the office in 1 week.  HOSPITAL COURSE:  This is an 77 year old female patient with history of hypertension, chronic back pain, and gouty arthritis, came to emergency room after she had an accidental fall.  The patient sustained a L1, L2, and L3 transverse process fracture.  She was admitted for pain control and further evaluation.  The patient was seen by orthopedist.  She continued on pain management.  Physical therapy also evaluated and treated the patient.  Currently, the patient has improved.  She is able to ambulate with walker.  Home health is arranged and the patient will be discharged to home in stable condition.    Airyana Sprunger D. Felecia Shelling, MD    TDF/MEDQ  D:  06/14/2010  T:  06/14/2010  Job:  [id number]  Electronically Signed by Avon Gully MD on 06/28/2010 08:33:34 AM

## 2010-07-13 NOTE — Consult Note (Signed)
  NAME:  Lori Bishop, Lori Bishop                ACCOUNT NO.:  [id number]  MEDICAL RECORD NO.:  [id number]          PATIENT TYPE:  LOCATION:                                 FACILITY:  PHYSICIAN:  J. Darreld Mclean, M.D. DATE OF BIRTH:  December 08, 1920  DATE OF CONSULTATION: DATE OF DISCHARGE:                                CONSULTATION   The patient is requested by Dr. Felecia Shelling.  The patient is an 77 year old female who fell and injured her lower back.  X-rays revealed transverse fractures on the left side of lumbovertebral L1, L2, and L3.  The patient has contusion on that side of her flank posterolateral.  She is having difficulty passing on water. She is in pain and has difficulty standing.  Neurovascular is intact. Reflexes are normal.  IMPRESSION:  Multiple transverse process fractures at L1, L2, L3, on the left.  PLAN: 1. I will have Physical Therapy see her.  Obtain a cast brace.  Wear     the cast brace when out of bed and ambulate as tolerated. 2. She will need to continue analgesia and I will follow with you.          ______________________________ J. Darreld Mclean, M.D.     JWK/MEDQ  D:  06/09/2010  T:  06/10/2010  Job:  [id number]  Electronically Signed by Darreld Mclean M.D. on 07/13/2010 08:18:03 AM

## 2010-07-21 NOTE — Discharge Summary (Signed)
[place]  Patient:    Lori Bishop, Lori Bishop Visit Number: [id number] MRN: [id number]          Service Type: [facility] Location: [id number] Attending Physician:  Herold Harms Dictated by:   Dian Situ, PA Admit Date:  10/09/2000 Discharge Date: 10/15/2000   CC:         Dr. Mirna Mires   Discharge Summary  DISCHARGE DIAGNOSES: 1. Status post right hip hemiarthroplasty secondary to fracture. 2. Postoperative anemia, improving. 3. Proteus urinary tract infection. 4. Pain control improved.  HISTORY OF PRESENT ILLNESS:  Lori Bishop is a 77 year old female with history of OA and hypertension who fell at home on July 27.  On a.m. of July 28 patient was unable to walk and presented to [facility] ED where x-rays done revealed subcapital fracture of right hip.  Cardiac enzymes also noted to be elevated and [facility] was consulted for workup.  She was cleared for surgery and underwent right hip bipolar hemiarthroplasty August 1 by ______. Postoperative, pain control has been an issue as well as leukocytosis with some elevated temperatures.  This has been improving and patient has been able to participate therapy.  Currently she is at minimum assist for transfers, standby to minimum assistance ambulatory to 40 feet with a rolling walker.  PAST MEDICAL HISTORY:  Significant for hypertension, OA, cholecystectomy, scoliosis of lumbar spine, appendectomy, and excision of left cataract with intraocular implants.  ALLERGIES:  No known drug allergies.  SOCIAL HISTORY:  Patient is married, lives in a one-level home with one step into entry.  Was independent prior to admission.  Does not use any tobacco or alcohol.  HOSPITAL COURSE:  Lori Bishop was admitted to rehab on October 09, 2000 for inpatient therapies to consist of PT/OT daily.  Past admission she was maintained on subcu Lovenox for DVT prophylaxis.  Bilateral lower  extremity duplex was done showing no evidence of DVT.  She was maintained on this throughout her stay.  She had multiple complaints regarding pain control and was started on OxyContin CR at 20 mg b.i.d. with scheduled Ultram and p.r.n. Tylenol to be used to provide relief.  This has worked very well and she was OxyContin CR was decreased to 10 mg b.i.d. home dose prior to discharge.  Her hip incision was noted to be healing well without any signs or symptoms of infection, no drainage, no erythema noted.  Labs done at admission showed hemoglobin 11.6, hematocrit 33.6, white count 15.4, platelets 485.  Sodium 142, potassium 3.9, chloride 105, CO2 29, BUN 8, creatinine 1.0.  LFTs slightly elevated with SGOT 63, SGPT 57.  Her urine culture grew out 20 per 1000 colonies of Proteus and she was started on amoxicillin for this. Follow-up labs showed improvement near resolution of her leukocytosis with white count at 10.8, hemoglobin 11.1, hematocrit 32.6.  Check of LFTs of August 13 shows abnormal LFTs to have resolved with AST 27, ALT 34, alkaline phos 114, T bili 0.3, direct bili 0.2.  Patient was well motivated and has participated well with her therapies.  She progressed to being modified independent for transfers, modified independent for ambulating 100 feet with a rolling walker, able to navigate one step with supervision.  She was independent on the unit without difficulty prior to discharge home.  She was modified independent for ADL needs including functional living skills. Further follow-up therapies to include home PT/OT which was set up through [facility].  On October 15, 2000 patient was discharged to home.  DISCHARGE MEDICATIONS: 1. Ultram 50 mg q.i.d. p.r.n. 2. Amoxicillin 250 mg t.i.d. x 4 days. 3. Os-Cal one p.o. b.i.d. 4. Zestoretic 20/25 one per day. 5. Oxycodone 5 or 10 mg q.4h. p.r.n. pain.  ACTIVITY:  Right total hip precautions.  To use walker.  DIET:   Regular.  WOUND CARE:  Keep area clean and dry.  SPECIAL INSTRUCTIONS:  No alcohol, no driving.  FOLLOW-UP:  Patient is to follow up with ______ in two weeks.  Follow-up with Dr. Lamar Benes as needed. Dictated by:   Dian Situ, PA Attending Physician:  Herold Harms DD:  11/20/00 TD:  11/20/00 Job: [id number]

## 2010-07-21 NOTE — Consult Note (Signed)
NAME:  Lori Bishop, Lori Bishop                          ACCOUNT NO.:  [id number]   MEDICAL RECORD NO.:  [id number]                   PATIENT TYPE:  OUT   LOCATION:  RAD                                  FACILITY:  [facility]   PHYSICIAN:  Aundra Dubin, M.D.            DATE OF BIRTH:  01/06/1921   DATE OF CONSULTATION:  09/17/2002  DATE OF DISCHARGE:                                   CONSULTATION   CHIEF COMPLAINT:  Back and joint pain, fingers numb.   HISTORY OF PRESENT ILLNESS:  She is a 76 year old black female who has had  many months of pain and aching in her hands.  She feels that the fingers are  numb out on the tip.  She fells that the numbness is more to the third,  fourth and fifth fingers.  There is mild swelling to the MCPs.  She also  aches and hurts in her knees.  Her feet have been swollen, but she feels  that this is fluid.  Her ankles hurt.  She says her elbows and shoulders do  not bother her.   Another problem is her hack hurts in a discrete area to the right side of  her low back.  This is a continuous pain and standing makes this  significantly worse.  She is limited to about 10-15 minutes of standing  before this worsens.  There can be occasional radiating pain that is sharp,  but usually, the pain is just a bad ache.  In the past, she has been on  Celebrex and Vioxx and she says these have not helped very much.   REVIEW OF SYSTEMS:  On review of systems, she says that her energy level if  low and not good.  This is because of the pain in her back and knees.  She  is stiff in the mornings for about an hour.  She denies fevers, insomnia,  rashes, chest pain, shortness of breath, headaches, acute vision changes or  jaw claudication.  The right eye has a known cataract and is somewhat  blurry.  There has been no pleurisy or Raynaud's, diarrhea, blood or mucous  to the bowel movement.  She has constipation.  Further review of systems was  negative.   PAST MEDICAL  HISTORY:  1. Hypertension.  2. Hysterectomy of kidney infection.  No history of kidney stones.  3. History of increased uric acid, and she is on Allopurinol.  4. Appendectomy 1948.  5. Cholecystectomy.  6. Right breast biopsy.  7. Right total hip replacement 2002.  8. Osteoporosis.   MEDICATIONS:  1. Miacalcin spray daily.  2. Actonel 35 mg p.o. q.weekly.  3. OxyContin 40 mg b.i.d.  4. Ultram 50 mg b.i.d.  5. Lasix 20 mg daily.  6. Sular 10 mg daily.  7. Allopurinol 100 mg daily.  8. Colace.  9. Calcium with vitamin D b.i.d.   ALLERGIES:  Drug intolerance is none.   FAMILY HISTORY:  Her father died after a stroke, and her mother had heart  disease.   SOCIAL HISTORY:  She is originally from [place] and lived in [place]  since the late 1940s.  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  She has one son  living in [place].  She is married and lives with her husband.  She  smoked for many years about two packs over a weeks time and stopped five  years ago.  Rare alcohol.   PHYSICAL EXAMINATION:  VITAL SIGNS:  Weight 175 pounds, blood pressure  130/70, respirations 16, pulse 68.  GENERAL APPEARANCE:  She is moderately overweight and is in no distress.  SKIN:  She has some hyperpigmented patched under the eyes on the cheek.  HEENT:  Fan hair pattern.  Eyes:  EOMI.  The right lens was moderately  dense.  The left lens was clear.  Mouth:  No obvious ulcers were petechiae.  NECK:  Negative JVD, normal thyroid.  LUNGS:  Clear.  HEART:  Regular, no murmur.  ABDOMEN:  Negative HSM, nontender.  MUSCULOSKELETAL:  The hands were quite tender with palpation across several  DIP's, PIP's and also the MCP's.  The MCP's appeared to have mild swelling.  The PIP's were the most tender.  Wrists were nonswollen and nontender.  Elbows and shoulders had good range of motion.  Neck was mildly stiff but  nontender.  Trigger points around the shoulder, neck, occiput, anterior  chest and upper  paraspinous muscles were nontender.  Low back had tenderness  over the sacrum, and I could not distinguish that this worsened more to the  right or left.  The right hip had a good range of motion and was nontender.  The left hip had a nontender range of motion.  The knees were cool and had  slight tenderness with flexion to 130 degrees.  The ankles had trace edema  and were nontender.  She had mild to moderate bunion formation bilaterally,  but the big toes were cool and nontender.  The entire foot was nontender.  NEUROLOGICAL:  Strength was 5/5, DTR's 2+ throughout, negative SR's.   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  She has tenderness to the  fingers including some to the MCP's.  We will have the hands x-rayed.  In  addition, her knees are quite achy, but clinically they are cool and were  not greatly tender.  I think it is best to x-ray these to evaluate for the  degree of arthritis.  Dr. Adaline Sill notes indicate that she has been on short  courses of prednisone.  [name] tells me that this breaks the cycle and  makes her feel better.  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  I will check an RF, ESR, CBC and C-MET.  We will place her on fairly low  dose prednisone to begin with 30 mg daily for three days then lowering to 20  mg and 15 mg each for one week intervals, and she will continue on 10 mg  daily.  She is cautioned that prednisone can increase her appetite and be  associated with weight gain.  She will snack on whole fruits.  She will  avoid weight gain.  1. Back pain.  I believe this is a separate issue.  The pain seems to be     reproducible over the sacrum.  We will have the low back x-rayed.  I  could not get a clear history that was truly radiating pain.  However,     when she stands, she worsens with the pain.  It could be some degree of     spinal stenosis to her back. 2. Osteoporosis.  She is on good medications to protect her bones.  3.  Questionable history of gout.  She remembers no acutely swollen joints.     She will continue on Allopurinol.  4. Right total hip replacement.   Thank you for allowing me to help in Ms. Kettering's care.  I await the results  of the x-rays and lab work.  Also, I await the results of her trial of  prednisone.  This may be degenerative type arthritis, but I will need to  work with her over  some months to feel comfortable as to what is going on.  Clearly the back is  a separate issue.  I suspect there is some degenerative arthritis to the  back that we will find.  I may want to consider having her go for a lumbar  MRI in the future.  She will return in about 4-5 weeks.                                               Aundra Dubin, M.D.    WWT/MEDQ  D:  09/17/2002  T:  09/18/2002  Job:  [id number]

## 2010-07-21 NOTE — Op Note (Signed)
NAME:  Lori Bishop, PULSE                          ACCOUNT NO.:  [id number]   MEDICAL RECORD NO.:  [id number]                   PATIENT TYPE:  AMB   LOCATION:  DAY                                  FACILITY:  [facility]   PHYSICIAN:  [name], M.D.                DATE OF BIRTH:  [birth year]   DATE OF PROCEDURE:  06/22/2003  DATE OF DISCHARGE:                                 OPERATIVE REPORT   PREOPERATIVE DIAGNOSES:  1. Postmenopausal bleeding.  2. Wide endometrial stripe by ultrasound.   POSTOPERATIVE DIAGNOSES:  1. Postmenopausal bleeding.  2. Wide endometrial stripe by ultrasound.  3. Endometrial polyp, appears benign.   PROCEDURE:  Hysteroscopy dilatation and curettage with curettage and removal  of endometrial polyp.   SURGEON:  [name], M.D.   ANESTHESIA:  Spinal.   BLOOD LOSS:  Minimal.   SPECIMEN:  Went to lab.   FINDINGS:  The patient was referred because of postmenopausal bleeding at 77  years old. I did an ultrasound at the office which revealed a thickened  endometrial stripe of 14 mm.  Hysteroscopy, today, revealed a benign  appearing polyp and endometrium and the rest of the endometrium was  completely benign.   DESCRIPTION OF OPERATION:  The patient was taken to the operating room and  placed in the supine position where she underwent spinal anesthetic.  She  was then placed in the dorsal lithotomy position and prepped and draped in  the usual sterile fashion.  Her bladder was drained.  A Graves speculum was  placed.  The cervix was grasped with a single-tooth tenaculum and a  paracervical block using a total of 10 cc of 1/2% Marcaine with 1:200,000  epinephrine was placed.   The cervix was dilated serially to allow passage of the hysteroscope  Hysteroscopy was performed and normal saline was used as distending media.  There was an endometrial polyp that was visible.  The rest of the  endometrium was normal.  Endometrial curettage was performed.  The  endometrial polyp was removed separately.  The rest of the  curettings were sent to the lab as well.  There was good hemostasis.  Monsel's was placed on the cervix where the single-tooth tenaculum was. The  patient tolerated the procedure well.  She was taken to the recovery room in  good stable condition doing well.  We will see her back in the office in a  week.      ___________________________________________                                            [name], M.D.   LHE/MEDQ  D:  06/22/2003  T:  06/22/2003  Job:  [id number]   cc:   Annia Friendly. Loleta Chance, M.D.  [id number]  [place]  [place] [address]  Fax: [phone]

## 2010-07-21 NOTE — Consult Note (Signed)
NAME:  Ashe, Graybeal A.                         ACCOUNT NO.:  [id number]   MEDICAL RECORD NO.:  [id number]                   PATIENT TYPE:   LOCATION:                                       FACILITY:   PHYSICIAN:  Aundra Dubin, M.D.            DATE OF BIRTH:   DATE OF CONSULTATION:  10/15/2002  DATE OF DISCHARGE:                                   CONSULTATION   CHIEF COMPLAINT:  Back and fingers numb.   Ms. Lori Bishop is a 77 year old woman who returns for follow-up concerning a set  of arthralgias.  She has been using the prednisone and her weight is up six  pounds.  She says that her back pain is some better but this has not helped  her hands, knees, and feet.  I have reviewed her hand x-rays from September 17, 2002 which show essentially normal hands with minor degenerative arthritis.  Her knee x-rays also are essentially normal with very minor degenerative-  type changes.  Her lumbar spine, however, is much worse showing severe  scoliosis, osteophyte formation throughout the right side of the vertebral  column, and areas of joint space narrowing which are moderately severe.  I  am sure that this is related to why her back is hurting.  There were no  compression fractures.  There has been no polyuria or polydipsia.  She  continues to have pain in her hands that she says is numbness.  Her feet  hurt more with some swelling.  She also aches above her knees and in the  thigh areas.   PAST MEDICAL HISTORY:  Is reviewed from the September 17, 2002 note.   MEDICINES:  Prednisone 10 mg daily.  Other medicines are unchanged from September 17, 2002.   PHYSICAL EXAMINATION:  VITAL SIGNS:  Weight 181 pounds, blood pressure  158/76, respirations 16.  GENERAL:  She walks with a cane and there is some waddling and slight  limping.  As I had her walk again during the interview this seemed to smooth  out and there was no significant aching.  She says she hurt in her back.  LUNGS:  Clear.  HEART:   Regular.  NECK:  Negative JVD.  BACK:  Mild tenderness to the low back.  MUSCULOSKELETAL:  The hands and wrists do not have active swelling and were  nontender.  She had a negative Tinel's sign bilaterally.  Wrists, elbows,  shoulders, and neck good range of motion with mild stiffness.  The knees are  cool.  They flexed easily to 130 degrees and there was no wincing.  She had  no joint line tenderness.  The ankles had trace edema and were slightly  tender.  The feet were nontender.   ASSESSMENT AND PLAN:  1. Back pain.  This is clearly related to the lumbar degenerative changes     seen on the x-ray.  She is on  OxyContin and Ultram which are good strong     medicines for this.  2. Hand pain and numbness.  This is clearly not arthritis.  The labs     obtained from September 18, 2002 did show a RF of 22 (0-20).  However,     clinically this does not match up to rheumatoid arthritis.  Her ESR was     slightly elevated at 35.  She had no anemia.  Her platelets were 264 and     wbc 9.6.  She is on 10 mg of prednisone a day.  She believes she has     about 10 tablets left.  I have asked her to lower to 5 mg a day and use     these until they run out.  3. Elevated AST.  On the labs checked her AST was 48 and ALT was 37.  Her     albumin was 3.3.   With the numbness in her hands, this is clearly not arthritis.  She tells me  that she has had a normal nerve conduction study.  Possibly she might  benefit from a trial of Neurontin but at this point I will return her back  to Dr. Loleta Chance.  I will be glad to reevaluate her on a p.r.n. basis.                                               Aundra Dubin, M.D.    WWT/MEDQ  D:  10/15/2002  T:  10/15/2002  Job:  [id number]   cc:   Annia Friendly. Loleta Chance, M.D.  [address]  [place]  [place] [address]  Fax: [phone]

## 2010-07-21 NOTE — Op Note (Signed)
NAME:  Lori Bishop, Lori Bishop                ACCOUNT NO.:  [id number]   MEDICAL RECORD NO.:  [id number]          PATIENT TYPE:  AMB   LOCATION:  DAY                           FACILITY:  [facility]   PHYSICIAN:  J. Darreld Mclean, M.D. DATE OF BIRTH:  10/26/1920   DATE OF PROCEDURE:  06/25/2006  DATE OF DISCHARGE:                               OPERATIVE REPORT   PREOPERATIVE DIAGNOSIS:  Carpal tunnel syndrome, right.   POSTOPERATIVE DIAGNOSIS:  Carpal tunnel syndrome, right.   OPERATIONS:  Release volar carpal ligament, saline neurolysis,  epineurotomy right median nerve.   SURGEON:  J. Darreld Mclean, M.D.   ANESTHESIA:  Bier block.   DRAINS:  None.  Volar plaster applied at the end of the procedure.   INDICATIONS FOR PROCEDURE:  The patient is an 77 year old female with  pain and tenderness in the right and left hands.  She has carpal tunnel  syndrome clinically with significant and severe muscle wasting over the  thenar eminence in the hand.  This is present on both sides.  Carpal  tunnel nerve conduction velocity studies were done by EMGs.  These  showed significant and severe carpal tunnel syndrome bilaterally.  She  complains of more pain on the right hand.  She has more atrophy on the  left hand.  I elected to do surgery on the right hand since that bothers  her the most.  She is right hand dominant.  I have explained with her  the risks and imponderables of the procedure.  This has been present for  some time and she may not have complete return of all nerve function.  She understands.  This should help with her pain.  She has failed  conservative treatment.   DESCRIPTION OF PROCEDURE:  The patient was seen in the holding area and  she identified the right hand as the correct surgical site and I placed  a mark on the right hand.  I talked to her family and then she was  brought back to the operating room.  She was given Bier block anesthesia  with spinal on the operating room table.   She was prepped and draped in  the usual manner.  We had a time out identifying [name] as the  patient and the right hand as the correct surgical site.  With careful  dissection the median nerve was identified proximally.  Vessel loop  placed around the nerve.  A grooved direct placed in the carpal tunnel  space and the volar carpal ligament was incised.  The nerve was markedly  compressed, really, really badly compressed.  Specimen of volar carpal  ligament sent to pathology.  Saline neurolysis and epineurotomy carried  out.  Retinaculum cut proximally.  The wounds were reapproximated with 3-  0 nylon interrupted vertical mattress manner.  Sterile dressing applied.  Bulky dressing applied and sheet cut and applied.  Sheet cut and cut  dorsally.  Volar plaster splint applied.  Ace bandage applied loosely.  The patient tolerated the procedure well and went to recovery in good  condition.  Prescription for  Darvocet-N 100 given for pain.  She is to resume all of her other  medicines that are on the medical record reconciliation sheet.  I will  see her back in the office in approximately 10 days to 2 weeks.  If she  has any difficulty, she is to contact me through the office or hospital.  These numbers have been provided.           ______________________________  Shela Commons. Darreld Mclean, M.D.     JWK/MEDQ  D:  06/25/2006  T:  06/25/2006  Job:  [id number]

## 2010-07-21 NOTE — H&P (Signed)
NAME:  Lori Bishop, Lori Bishop                ACCOUNT NO.:  [id number]   MEDICAL RECORD NO.:  [id number]          PATIENT TYPE:  AMB   LOCATION:  DAY                           FACILITY:  [facility]   PHYSICIAN:  J. Darreld Mclean, M.D. DATE OF BIRTH:  1920/08/13   DATE OF ADMISSION:  DATE OF DISCHARGE:  LH                              HISTORY & PHYSICAL   CHIEF COMPLAINT:  My hands hurt.   The patient is an 77 year old female with pain and tenderness in both  hands, more on the right than on the left.  She has had carpal tunnel  syndrome for several years.  She had nerve conduction velocities done  recently by neurological services at the hospital showing severe  bilateral carpal tunnel syndrome.  The patient has muscle wasting and  atrophy.  She has pain and numbness in her fingers that are getting  progressively worse.  She hardly has any feeling at all in the tips of  the fingers.  She denies any trauma.  She has been counseled by Dr. Jen Mow  and myself about carpal tunnel surgery.  I have gone over the risk and  imponderables, she appears to understand and agreed with the procedure  as outlined.   PREVIOUS SURGERY:  1. Cholecystectomy.  2. Appendectomy.  3. Right bipolar hip.   CURRENT MEDICATIONS:  1. Naprosyn 500.  2. Darvocet-N 100.  3. Actonel 35 mg once a week.  4. Ultram occasionally.   THE PATIENT DENIES ANY ALLERGIES.   She has a history of:  1. Bronchitis.  2. Hypertension.  3. Osteoarthritis.  4. Degenerative type scoliosis.  5. Chronic back pain.   Dr. Jen Mow is her family physician.  Previously, it had been Dr. Loleta Chance.   The patient is married and lives here in [place], and is retired.   PHYSICAL EXAMINATION:  VITAL SIGNS:  Within normal limits.  CNS:  Intact.  SKIN:  Intact.  MENTAL STATUS:  The patient is alert, cooperative, oriented.  NECK:  Supple.  LUNGS:  Clear to P&A.  HEART:  Regular without murmur heard.  ABDOMEN:  Soft and without masses.  EXTREMITIES:   The patient has marked muscle wasting in both thenar  eminences, much worse on the __________.  Both hands have marked  decreased sensation in the median nerve distribution.  Grip strength are  bilaterally decreased.  She has mild DJD of the fingers.  CNS:  Otherwise intact.   IMPRESSION:  Significant severe carpal tunnel syndrome bilaterally with  thenar muscle wasting, worse on the right than the left with history of  pain.   PLAN:  Open carpal tunnel release on the right.  I discussed with her  the planned procedure, risks, and imponderables.  She appears to  understand and agrees to proceed as previously outlined.  Labs are  pending.  She understands she will probably need the other side done.  She understands she may not get complete motor return.  ______________________________  Shela Commons. Darreld Mclean, M.D.     JWK/MEDQ  D:  06/24/2006  T:  06/24/2006  Job:  [id number]

## 2010-07-21 NOTE — H&P (Signed)
NAME:  Lori Bishop, Lori Bishop                          ACCOUNT NO.:  [id number]   MEDICAL RECORD NO.:  [id number]                   PATIENT TYPE:  AMB   LOCATION:  DAY                                  FACILITY:  [facility]   PHYSICIAN:  Jerolyn Shin C. Katrinka Blazing, M.D.                DATE OF BIRTH:  1920-10-12   DATE OF ADMISSION:  DATE OF DISCHARGE:                                HISTORY & PHYSICAL   HISTORY OF PRESENT ILLNESS:  [age]-year-old female referred for a  colonoscopy.  The patient has had problems with progressive constipation.  There has not been any rectal bleeding or family history of colon cancer or  polyp.  She will have a screening colonoscopy.  She has not ever had a  colonoscopy.   PAST HISTORY:  Past history is positive for:  1. Osteoarthritis.  2. Hypertension.  3. Gout.   MEDICATIONS:  1. Duragesic 50 mcg every 3 days.  2. Demadex 20 mg b.i.d.  3. Zyloprim 100 mg daily.  4. Sular 80 mg daily.  5. Kay Ciel 10 mEq b.i.d.  6. Ultram 50 mg b.i.d.   SURGERY:  1. Appendectomy.  2. Cholecystectomy.  3. Left breast biopsy.  4. Right hip arthroplasty.  5. Cataract extraction, OS.  6. D&C.   PHYSICAL EXAMINATION:  GENERAL:  On examination, blood pressure 140/80,  pulse 88, respirations 20, weight 176 pounds.  HEENT:  Unremarkable.  NECK:  Neck is supple without JVD or bruit.  CHEST:  Chest clear to auscultation.  HEART:  Regular rate and rhythm without murmur, gallop or rub.  ABDOMEN:  Abdomen soft, nontender.  No masses.  RECTAL:  Normal.  Stool guaiac negative.  EXTREMITIES:  No cyanosis, clubbing or edema.  NEUROLOGIC:  No focal motor, sensory or cerebellar deficit.   IMPRESSION:  1. Chronic constipation with need for screening colonoscopy.  2. Hypertension.  3. Osteoarthritis.   PLAN:  Screening colonoscopy.     ___________________________________________                                         Dirk Dress. Katrinka Blazing, M.D.   LCS/MEDQ  D:  07/21/2003  T:  07/22/2003   Job:  [id number]

## 2010-11-30 LAB — POCT CARDIAC MARKERS
CKMB, poc: 2.6
CKMB, poc: 2.9
Myoglobin, poc: 95.2
Troponin i, poc: <0.05

## 2010-11-30 LAB — DIFFERENTIAL
Basophils Relative: 0
Eosinophils Relative: 0
Lymphocytes Relative: 15
Lymphs Abs: 2.4
Monocytes Absolute: 0.7
Monocytes Relative: 4
Neutro Abs: 12.5 — ABNORMAL HIGH

## 2010-11-30 LAB — BASIC METABOLIC PANEL
BUN: 12
CO2: 35 — ABNORMAL HIGH
Calcium: 9.3
Glucose, Bld: 141 — ABNORMAL HIGH
Potassium: 2.5 — CL

## 2010-11-30 LAB — CBC
Hemoglobin: 14.8
Platelets: 223
RDW: 15.6 — ABNORMAL HIGH
WBC: 15.7 — ABNORMAL HIGH

## 2010-12-01 LAB — DIFFERENTIAL
Basophils Absolute: 0
Basophils Relative: 0
Eosinophils Relative: 0
Lymphocytes Relative: 5 — ABNORMAL LOW
Monocytes Absolute: 0.1

## 2010-12-01 LAB — CBC
MCHC: 33.1
RDW: 15.1

## 2010-12-01 LAB — URINALYSIS, ROUTINE W REFLEX MICROSCOPIC
Bilirubin Urine: NEGATIVE
Hgb urine dipstick: NEGATIVE
Protein, ur: NEGATIVE
Urobilinogen, UA: 0.2

## 2010-12-01 LAB — BASIC METABOLIC PANEL
CO2: 32
Calcium: 8.9
Creatinine, Ser: 0.99
Glucose, Bld: 196 — ABNORMAL HIGH

## 2010-12-01 LAB — CK TOTAL AND CKMB (NOT AT ARMC)
Relative Index: INVALID
Total CK: 60

## 2011-06-27 ENCOUNTER — Other Ambulatory Visit (HOSPITAL_COMMUNITY): Payer: Self-pay | Admitting: Internal Medicine

## 2011-06-27 ENCOUNTER — Ambulatory Visit (HOSPITAL_COMMUNITY)
Admission: RE | Admit: 2011-06-27 | Discharge: 2011-06-27 | Disposition: A | Discharge status: Home / Self Care | Payer: Medicare Other | Source: Ambulatory Visit | Attending: Internal Medicine | Admitting: Internal Medicine

## 2011-06-27 DIAGNOSIS — M545 Low back pain, unspecified: Secondary | ICD-10-CM | POA: Insufficient documentation

## 2011-06-27 DIAGNOSIS — M4120 Other idiopathic scoliosis, site unspecified: Secondary | ICD-10-CM | POA: Insufficient documentation

## 2011-06-27 DIAGNOSIS — M549 Dorsalgia, unspecified: Secondary | ICD-10-CM

## 2011-06-27 DIAGNOSIS — M51379 Other intervertebral disc degeneration, lumbosacral region without mention of lumbar back pain or lower extremity pain: Secondary | ICD-10-CM | POA: Insufficient documentation

## 2011-06-27 DIAGNOSIS — M5137 Other intervertebral disc degeneration, lumbosacral region: Secondary | ICD-10-CM | POA: Insufficient documentation

## 2012-05-23 ENCOUNTER — Encounter (HOSPITAL_COMMUNITY)
Admission: EM | Disposition: A | Discharge status: Home / Self Care | Payer: Self-pay | Source: Home / Self Care | Attending: Internal Medicine

## 2012-05-23 ENCOUNTER — Encounter (HOSPITAL_COMMUNITY): Payer: Self-pay | Admitting: Anesthesiology

## 2012-05-23 ENCOUNTER — Inpatient Hospital Stay (HOSPITAL_COMMUNITY)
Admission: EM | Admit: 2012-05-23 | Discharge: 2012-05-27 | DRG: 445 | Disposition: A | Discharge status: Home / Self Care | Payer: Medicare Other | Attending: Internal Medicine | Admitting: Internal Medicine

## 2012-05-23 ENCOUNTER — Inpatient Hospital Stay (HOSPITAL_COMMUNITY): Payer: Medicare Other

## 2012-05-23 ENCOUNTER — Emergency Department (HOSPITAL_COMMUNITY): Payer: Medicare Other

## 2012-05-23 ENCOUNTER — Encounter (HOSPITAL_COMMUNITY): Payer: Self-pay

## 2012-05-23 ENCOUNTER — Inpatient Hospital Stay (HOSPITAL_COMMUNITY): Payer: Medicare Other | Admitting: Anesthesiology

## 2012-05-23 DIAGNOSIS — K8051 Calculus of bile duct without cholangitis or cholecystitis with obstruction: Principal | ICD-10-CM | POA: Diagnosis present

## 2012-05-23 DIAGNOSIS — M1990 Unspecified osteoarthritis, unspecified site: Secondary | ICD-10-CM | POA: Diagnosis present

## 2012-05-23 DIAGNOSIS — Z8249 Family history of ischemic heart disease and other diseases of the circulatory system: Secondary | ICD-10-CM

## 2012-05-23 DIAGNOSIS — Z823 Family history of stroke: Secondary | ICD-10-CM

## 2012-05-23 DIAGNOSIS — R252 Cramp and spasm: Secondary | ICD-10-CM | POA: Diagnosis present

## 2012-05-23 DIAGNOSIS — M109 Gout, unspecified: Secondary | ICD-10-CM | POA: Diagnosis present

## 2012-05-23 DIAGNOSIS — K8050 Calculus of bile duct without cholangitis or cholecystitis without obstruction: Secondary | ICD-10-CM

## 2012-05-23 DIAGNOSIS — K5710 Diverticulosis of small intestine without perforation or abscess without bleeding: Secondary | ICD-10-CM

## 2012-05-23 DIAGNOSIS — Z809 Family history of malignant neoplasm, unspecified: Secondary | ICD-10-CM

## 2012-05-23 DIAGNOSIS — K8309 Other cholangitis: Secondary | ICD-10-CM | POA: Diagnosis present

## 2012-05-23 DIAGNOSIS — K5900 Constipation, unspecified: Secondary | ICD-10-CM | POA: Diagnosis present

## 2012-05-23 DIAGNOSIS — Z87891 Personal history of nicotine dependence: Secondary | ICD-10-CM

## 2012-05-23 HISTORY — DX: Gout, unspecified: M10.9

## 2012-05-23 HISTORY — DX: Unspecified osteoarthritis, unspecified site: M19.90

## 2012-05-23 HISTORY — PX: ERCP: SHX5425

## 2012-05-23 HISTORY — PX: REMOVAL OF STONES: SHX5545

## 2012-05-23 HISTORY — DX: Cramp and spasm: R25.2

## 2012-05-23 HISTORY — PX: SPHINCTEROTOMY: SHX5544

## 2012-05-23 HISTORY — PX: BALLOON DILATION: SHX5330

## 2012-05-23 HISTORY — PX: BILIARY STENT PLACEMENT: SHX5538

## 2012-05-23 LAB — CBC WITH DIFFERENTIAL/PLATELET
Basophils Relative: 0 % (ref 0–1)
Eosinophils Absolute: 0 10*3/uL (ref 0.0–0.7)
Eosinophils Relative: 0 % (ref 0–5)
Hemoglobin: 14.4 g/dL (ref 12.0–15.0)
MCH: 29 pg (ref 26.0–34.0)
MCHC: 34.9 g/dL (ref 30.0–36.0)
Monocytes Relative: 4 % (ref 3–12)
Neutrophils Relative %: 93 % — ABNORMAL HIGH (ref 43–77)

## 2012-05-23 LAB — TROPONIN I: Troponin I: <0.3 ng/mL (ref <0.30)

## 2012-05-23 LAB — COMPREHENSIVE METABOLIC PANEL
Albumin: 3.9 g/dL (ref 3.5–5.2)
BUN: 9 mg/dL (ref 6–23)
Calcium: 9.7 mg/dL (ref 8.4–10.5)
Creatinine, Ser: 0.81 mg/dL (ref 0.50–1.10)
Potassium: 3.5 mEq/L (ref 3.5–5.1)
Total Protein: 7.9 g/dL (ref 6.0–8.3)

## 2012-05-23 LAB — URINALYSIS, ROUTINE W REFLEX MICROSCOPIC
Nitrite: NEGATIVE
Protein, ur: NEGATIVE mg/dL
Specific Gravity, Urine: 1.015 (ref 1.005–1.030)
Urobilinogen, UA: 0.2 mg/dL (ref 0.0–1.0)

## 2012-05-23 LAB — LIPASE, BLOOD: Lipase: 24 U/L (ref 11–59)

## 2012-05-23 SURGERY — ERCP, WITH INTERVENTION IF INDICATED
Anesthesia: General

## 2012-05-23 MED ORDER — ONDANSETRON HCL 4 MG/2ML IJ SOLN
4.0000 mg | Freq: Once | INTRAMUSCULAR | Status: AC
  Administered 2012-05-23: 4 mg via INTRAVENOUS
  Filled 2012-05-23: qty 2

## 2012-05-23 MED ORDER — SODIUM CHLORIDE 0.9 % IV SOLN
INTRAVENOUS | Status: DC
  Administered 2012-05-23 – 2012-05-25 (×5): via INTRAVENOUS

## 2012-05-23 MED ORDER — GLYCOPYRROLATE 0.2 MG/ML IJ SOLN
INTRAMUSCULAR | Status: DC | PRN
  Administered 2012-05-23: 0.2 mg via INTRAVENOUS
  Administered 2012-05-23: 0.4 mg via INTRAVENOUS

## 2012-05-23 MED ORDER — HYDROMORPHONE HCL PF 1 MG/ML IJ SOLN
0.5000 mg | Freq: Once | INTRAMUSCULAR | Status: AC
  Administered 2012-05-23: 0.5 mg via INTRAVENOUS
  Filled 2012-05-23: qty 1

## 2012-05-23 MED ORDER — ROCURONIUM BROMIDE 50 MG/5ML IV SOLN
INTRAVENOUS | Status: AC
  Filled 2012-05-23: qty 1

## 2012-05-23 MED ORDER — ALLOPURINOL 100 MG PO TABS
100.0000 mg | ORAL_TABLET | Freq: Every day | ORAL | Status: DC
  Administered 2012-05-24 – 2012-05-27 (×4): 100 mg via ORAL
  Filled 2012-05-23 (×4): qty 1

## 2012-05-23 MED ORDER — SODIUM CHLORIDE 0.9 % IV SOLN
3.0000 g | Freq: Once | INTRAVENOUS | Status: AC
  Administered 2012-05-23: 3 g via INTRAVENOUS
  Filled 2012-05-23: qty 3

## 2012-05-23 MED ORDER — NEOSTIGMINE METHYLSULFATE 1 MG/ML IJ SOLN
INTRAMUSCULAR | Status: DC | PRN
  Administered 2012-05-23: 2 mg via INTRAVENOUS

## 2012-05-23 MED ORDER — CALCIUM CARBONATE-VITAMIN D 500-200 MG-UNIT PO TABS
1.0000 | ORAL_TABLET | Freq: Two times a day (BID) | ORAL | Status: DC
  Administered 2012-05-23 – 2012-05-27 (×8): 1 via ORAL
  Filled 2012-05-23 (×8): qty 1

## 2012-05-23 MED ORDER — ROCURONIUM BROMIDE 100 MG/10ML IV SOLN
INTRAVENOUS | Status: DC | PRN
  Administered 2012-05-23: 5 mg via INTRAVENOUS
  Administered 2012-05-23: 25 mg via INTRAVENOUS

## 2012-05-23 MED ORDER — FENTANYL CITRATE 0.05 MG/ML IJ SOLN
50.0000 ug | Freq: Once | INTRAMUSCULAR | Status: AC
  Administered 2012-05-23: 50 ug via INTRAVENOUS
  Filled 2012-05-23: qty 2

## 2012-05-23 MED ORDER — EPHEDRINE SULFATE 50 MG/ML IJ SOLN
INTRAMUSCULAR | Status: DC | PRN
  Administered 2012-05-23: 5 mg via INTRAVENOUS

## 2012-05-23 MED ORDER — FENTANYL 100 MCG/HR TD PT72
100.0000 ug | MEDICATED_PATCH | TRANSDERMAL | Status: DC
  Administered 2012-05-23 – 2012-05-26 (×2): 100 ug via TRANSDERMAL
  Filled 2012-05-23 (×2): qty 1

## 2012-05-23 MED ORDER — SODIUM CHLORIDE 0.9 % IV SOLN: 3.0000 g | Freq: Four times a day (QID) | INTRAVENOUS | Status: DC

## 2012-05-23 MED ORDER — DEXTROSE-NACL 5-0.45 % IV SOLN
INTRAVENOUS | Status: AC
  Administered 2012-05-23: 19:00:00 via INTRAVENOUS

## 2012-05-23 MED ORDER — GLYCOPYRROLATE 0.2 MG/ML IJ SOLN
INTRAMUSCULAR | Status: AC
  Filled 2012-05-23: qty 2

## 2012-05-23 MED ORDER — GLYCOPYRROLATE 0.2 MG/ML IJ SOLN
INTRAMUSCULAR | Status: AC
  Filled 2012-05-23: qty 1

## 2012-05-23 MED ORDER — FENTANYL CITRATE 0.05 MG/ML IJ SOLN
INTRAMUSCULAR | Status: AC
  Filled 2012-05-23: qty 2

## 2012-05-23 MED ORDER — SODIUM CHLORIDE 0.9 % IV SOLN
3.0000 g | Freq: Four times a day (QID) | INTRAVENOUS | Status: DC
  Administered 2012-05-23 – 2012-05-27 (×15): 3 g via INTRAVENOUS
  Filled 2012-05-23 (×20): qty 3

## 2012-05-23 MED ORDER — STERILE WATER FOR IRRIGATION IR SOLN
Status: DC | PRN
  Administered 2012-05-23: 15:00:00

## 2012-05-23 MED ORDER — SODIUM CHLORIDE 0.9 % IV SOLN
INTRAVENOUS | Status: DC | PRN
  Administered 2012-05-23: 15:00:00

## 2012-05-23 MED ORDER — HYDROMORPHONE HCL PF 1 MG/ML IJ SOLN
0.5000 mg | INTRAMUSCULAR | Status: AC | PRN
  Administered 2012-05-23 – 2012-05-24 (×3): 0.5 mg via INTRAVENOUS
  Filled 2012-05-23 (×3): qty 1

## 2012-05-23 MED ORDER — ONDANSETRON HCL 4 MG/2ML IJ SOLN: 4.0000 mg | Freq: Three times a day (TID) | INTRAMUSCULAR | Status: AC | PRN

## 2012-05-23 MED ORDER — LACTATED RINGERS IV SOLN
INTRAVENOUS | Status: DC | PRN
  Administered 2012-05-23: 15:00:00 via INTRAVENOUS

## 2012-05-23 MED ORDER — ETOMIDATE 2 MG/ML IV SOLN
INTRAVENOUS | Status: AC
  Filled 2012-05-23: qty 10

## 2012-05-23 MED ORDER — EPHEDRINE SULFATE 50 MG/ML IJ SOLN
INTRAMUSCULAR | Status: AC
  Filled 2012-05-23: qty 1

## 2012-05-23 MED ORDER — ETOMIDATE 2 MG/ML IV SOLN
INTRAVENOUS | Status: DC | PRN
  Administered 2012-05-23: 14 mg via INTRAVENOUS

## 2012-05-23 MED ORDER — POTASSIUM CHLORIDE CRYS ER 20 MEQ PO TBCR
40.0000 meq | EXTENDED_RELEASE_TABLET | Freq: Two times a day (BID) | ORAL | Status: DC
  Administered 2012-05-23 – 2012-05-27 (×8): 40 meq via ORAL
  Filled 2012-05-23 (×2): qty 2
  Filled 2012-05-23: qty 1
  Filled 2012-05-23 (×5): qty 2

## 2012-05-23 MED ORDER — MIDAZOLAM HCL 2 MG/2ML IJ SOLN
INTRAMUSCULAR | Status: AC
  Filled 2012-05-23: qty 2

## 2012-05-23 MED ORDER — FELODIPINE ER 5 MG PO TB24
10.0000 mg | ORAL_TABLET | Freq: Every day | ORAL | Status: DC
  Administered 2012-05-24 – 2012-05-27 (×4): 10 mg via ORAL
  Filled 2012-05-23 (×3): qty 2

## 2012-05-23 MED ORDER — TORSEMIDE 20 MG PO TABS
20.0000 mg | ORAL_TABLET | Freq: Every day | ORAL | Status: DC
  Administered 2012-05-24 – 2012-05-27 (×4): 20 mg via ORAL
  Filled 2012-05-23 (×4): qty 1

## 2012-05-23 MED ORDER — MIDAZOLAM HCL 5 MG/5ML IJ SOLN
INTRAMUSCULAR | Status: DC | PRN
  Administered 2012-05-23: 1 mg via INTRAVENOUS

## 2012-05-23 MED ORDER — CALTRATE 600+D PLUS MINERALS 600-800 MG-UNIT PO TABS: 1.0000 | ORAL_TABLET | Freq: Two times a day (BID) | ORAL | Status: DC

## 2012-05-23 MED ORDER — FENTANYL CITRATE 0.05 MG/ML IJ SOLN
INTRAMUSCULAR | Status: DC | PRN
  Administered 2012-05-23 (×2): 25 ug via INTRAVENOUS

## 2012-05-23 SURGICAL SUPPLY — 29 items
BALLN DILATOR CRE WIREGUIDE (BALLOONS) ×2
BALLN RETRIEVAL 12X15 (BALLOONS) ×1 IMPLANT
BALLOON DILATOR CRE WIREGUIDE (BALLOONS) IMPLANT
BALN RTRVL 200 6-7FR 12-15 (BALLOONS) ×1
BASKET TRAPEZOID 3X6 (MISCELLANEOUS) ×1 IMPLANT
BASKET TRAPEZOID LITHO 2.5X5 (MISCELLANEOUS) IMPLANT
BLOCK BITE 60FR ADLT L/F BLUE (MISCELLANEOUS) IMPLANT
BSKT STON RTRVL TRAPEZOID 3X6 (MISCELLANEOUS) ×1
DEVICE INFLATION ENCORE 26 (MISCELLANEOUS) ×1 IMPLANT
DEVICE LOCKING W-BIOPSY CAP (MISCELLANEOUS) ×2 IMPLANT
FLOOR PAD 36X40 (MISCELLANEOUS)
GUIDEWIRE HYDRA JAGWIRE .35 (WIRE) IMPLANT
GUIDEWIRE JAG HINI 025X260CM (WIRE) IMPLANT
NDL HYPO 18GX1.5 BLUNT FILL (NEEDLE) IMPLANT
NEEDLE HYPO 18GX1.5 BLUNT FILL (NEEDLE) IMPLANT
PAD FLOOR 36X40 (MISCELLANEOUS) IMPLANT
SNARE ROTATE MED OVAL 20MM (MISCELLANEOUS) IMPLANT
SNARE SHORT THROW 27M MED OVAL (MISCELLANEOUS) IMPLANT
SPHINCTEROTOME AUTOTOME .25 (MISCELLANEOUS) IMPLANT
SPHINCTEROTOME HYDRATOME 44 (MISCELLANEOUS) ×3 IMPLANT
SPONGE GAUZE 4X4 12PLY (GAUZE/BANDAGES/DRESSINGS) IMPLANT
SYR 20CC LL (SYRINGE) IMPLANT
SYR 3ML LL SCALE MARK (SYRINGE) IMPLANT
SYR 50ML LL SCALE MARK (SYRINGE) ×1 IMPLANT
SYSTEM CONTINUOUS INJECTION (MISCELLANEOUS) ×2 IMPLANT
TUBING ENDO SMARTCAP PENTAX (MISCELLANEOUS) IMPLANT
WALLSTENT METAL COVERED 10X60 (STENTS) IMPLANT
WALLSTENT METAL COVERED 10X80 (STENTS) IMPLANT
WATER STERILE IRR 1000ML POUR (IV SOLUTION) ×2 IMPLANT

## 2012-05-23 NOTE — Transfer of Care (Addendum)
Immediate Anesthesia Transfer of Care Note  Patient: Lori Bishop  Procedure(s) Performed: Procedure(s): ENDOSCOPIC RETROGRADE CHOLANGIOPANCREATOGRAPHY (ERCP) BALLOON AND BASKET REMOVAL OF STONES SPHINCTEROTOMY BILIARY STENT PLACEMENT AMPULLARY BALLOON DILATION  Patient Location: PACU  Anesthesia Type: General  Level of Consciousness: awake  Airway & Oxygen Therapy: Patient Spontanous Breathing and non-rebreather face mask  Post-op Assessment: Report given to PACU RN, Post -op Vital signs reviewed and stable and Patient moving all extremities  Post vital signs: Reviewed and stable  Complications: No apparent anesthesia complications

## 2012-05-23 NOTE — ED Notes (Signed)
Her for eval of n/v and chills since Wednesday. Pt also complain of pain under right breast that goes into back. Pt was given zofran 4 mg IV prior to arrival

## 2012-05-23 NOTE — ED Notes (Signed)
Patient is resting comfortably. 

## 2012-05-23 NOTE — Anesthesia Preprocedure Evaluation (Signed)
Anesthesia Evaluation  Patient identified by MRN, date of birth, ID band Patient awake    Reviewed: Allergy & Precautions, H&P , NPO status , Patient's Chart, lab work & pertinent test results  Airway Mallampati: II TM Distance: >3 FB Neck ROM: Full    Dental  (+) Edentulous Upper and Edentulous Lower   Pulmonary    Pulmonary exam normal       Cardiovascular Exercise Tolerance: Poor hypertension, Pt. on medications Rate:Normal     Neuro/Psych Carpal tunnel numbness right hand; post plating right femur  Neuromuscular disease    GI/Hepatic Enzymes elevated   Endo/Other    Renal/GU      Musculoskeletal  (+) Arthritis -, Osteoarthritis,    Abdominal Normal abdominal exam  (+)   Peds  Hematology   Anesthesia Other Findings   Reproductive/Obstetrics                           Anesthesia Physical Anesthesia Plan  ASA: II  Anesthesia Plan: General   Post-op Pain Management:    Induction: Rapid sequence and Cricoid pressure planned  Airway Management Planned: Oral ETT  Additional Equipment:   Intra-op Plan:   Post-operative Plan: Extubation in OR  Informed Consent: I have reviewed the patients History and Physical, chart, labs and discussed the procedure including the risks, benefits and alternatives for the proposed anesthesia with the patient or authorized representative who has indicated his/her understanding and acceptance.     Plan Discussed with: Anesthesiologist  Anesthesia Plan Comments:         Anesthesia Quick Evaluation

## 2012-05-23 NOTE — Anesthesia Postprocedure Evaluation (Signed)
Anesthesia Post Note  Patient: Lori Bishop  Procedure(s) Performed: Procedure(s): ENDOSCOPIC RETROGRADE CHOLANGIOPANCREATOGRAPHY (ERCP) BALLOON AND BASKET REMOVAL OF STONES SPHINCTEROTOMY BILIARY STENT PLACEMENT AMPULLARY BALLOON DILATION  Anesthesia type: General  Patient location: PACU  Post pain: Pain level controlled  Post assessment: Post-op Vital signs reviewed, Patient's Cardiovascular Status Stable, Respiratory Function Stable, Patent Airway, No signs of Nausea or vomiting and Pain level controlled  Last Vitals:  Filed Vitals:   05/23/12 1557  BP: 134/57  Pulse: 88  Temp: 36.9 C  Resp: 20    Post vital signs: Reviewed and stable  Level of consciousness: awake and alert   Complications: No apparent anesthesia complications

## 2012-05-23 NOTE — ED Provider Notes (Signed)
History    This chart was scribed for Lori Horn, MD by Charolett Bumpers, ED Scribe. The patient was seen in room [facility]11[facility]. Patient's care was started at 0850.   CSN: [id number]  Arrival date & time 05/23/12  9562   First MD Initiated Contact with Patient 05/23/12 [phone]      Chief Complaint  Patient presents with  . Abdominal Pain  . Nausea  . Emesis    The history is provided by the patient. No language interpreter was used.   Lori Bishop is a 79 y.o. female who has a h/o chronic back pain and HTN presents to the Emergency Department via EMS complaining of abdominal pain that started earlier today. Family reports the pt had chills 2 days ago for a couple of hours but was normal yesterday. She states that around 5 am this morning, she woke up with RUQ abdominal pain that goes into her right flank, nausea and chills. She had one episode of non-bloody emesis at 7 am. The abdominal pain has been constant since waking, is unchanged and is aggravated with movement and palpation. She describes the pain as a squeezing pain. She denies any hematemesis, diarrhea, chest pain, fevers, cough, confusion, SOB, rashes. She believes she has had a cholecystectomy and appendectomy but is not sure. She denies taking an anticoagulants. She is on Fentanyl patches for her chronic back pain. She lives alone and ambulates with a walker at baseline.    Past Medical History  Diagnosis Date  . Gout   . Osteoarthritis   . Leg cramps     Past Surgical History  Procedure Laterality Date  . Cholecystectomy    . Appendectomy    . Cataract extraction Bilateral   . Wrist arthroplasty Right   . Right femur      metal plate  . Breast biopsy Left     Family History  Problem Relation Age of Onset  . Stroke Father   . CAD Mother   . Cancer Son     ?metastatic    History  Substance Use Topics  . Smoking status: Former Games developer  . Smokeless tobacco: Not on file  . Alcohol Use: No    OB  History   Grav Para Term Preterm Abortions TAB SAB Ect Mult Living                  Review of Systems A complete 10 system review of systems was obtained and all systems are negative except as noted in the HPI and PMH.   Allergies  Review of patient's allergies indicates no known allergies.  Home Medications  No current outpatient prescriptions on file.  BP 123/57  Pulse 80  Temp(Src) 98.3 F (36.8 C) (Oral)  Resp 16  Ht 5' (1.524 m)  Wt 142 lb (64.411 kg)  BMI 27.73 kg/m2  SpO2 97%  Physical Exam  Nursing note and vitals reviewed. Constitutional:  Awake, alert, nontoxic appearance.  HENT:  Head: Atraumatic.  Eyes: Right eye exhibits no discharge. Left eye exhibits no discharge.  Neck: Neck supple.  Cardiovascular: Normal rate and regular rhythm.   Murmur heard.  Systolic murmur is present  Soft systolic murmur.   Pulmonary/Chest: Effort normal and breath sounds normal. No respiratory distress. She has no wheezes. She has no rales. She exhibits no tenderness.  Abdominal: Soft. Bowel sounds are normal. There is tenderness. There is no rebound and no CVA tenderness.  Moderate RUQ abdominal tenderness, rest of abdomen  is non-tender. No CVA tenderness.   Musculoskeletal: She exhibits no tenderness.  Baseline ROM, no obvious new focal weakness.  Neurological:  Mental status and motor strength appears baseline for patient and situation.  Skin: No rash noted.  Psychiatric: She has a normal mood and affect.    ED Course  Procedures (including critical care time) ECG: Sinus rhythm, premature atrial complexes, artifact, left axis deviation, ventricular rate 95, no acute ischemic changes noted, no significant change noted compared with June 2009 DIAGNOSTIC STUDIES: Oxygen Saturation is 98% on 2 L [place], adequate by my interpretation.    COORDINATION OF CARE:  9:02 AM-Patient / Family / Caregiver understand and agree with initial ED impression and plan with expectations set  for ED visit. Will start with ultrasound of RUQ, UA and blood work. Will start IV fluids, pain and nausea medication, NPO.   09:15-Medication Orders: Fentanyl (Sublimaze) injection 50 mcg-once; Ondansetron (Zofran) injection 4 mg-once; 0.9% sodium chloride infusion-continuous.   11:38 AM-Pt stable in ED with no significant deterioration in condition. She reports ongoing pain, repeat exam is unchanged. Will consult with Dr. Felecia Shelling.   11:57 AM-Consult complete with Dr. Felecia Shelling, Internal medicine. Patient case explained and discussed. [facility] agrees to admit patient for further evaluation and treatment as inpatient. Holding orders to be placed and GI to be paged. Call ended at 12:02 PM.  12:07 PM-Consult complete with Dr. Jena Gauss, GI. Patient case explained and discussed. Call ended at 12:08 PM   Results for orders placed during the hospital encounter of 05/23/12  CBC WITH DIFFERENTIAL      Result Value Range   WBC 16.4 (*) 4.0 - 10.5 K/uL   RBC 4.97  3.87 - 5.11 MIL/uL   Hemoglobin 14.4  12.0 - 15.0 g/dL   HCT 13.0  86.5 - 78.4 %   MCV 83.1  78.0 - 100.0 fL   MCH 29.0  26.0 - 34.0 pg   MCHC 34.9  30.0 - 36.0 g/dL   RDW 69.6  29.5 - 28.4 %   Platelets 195  150 - 400 K/uL   Neutrophils Relative 93 (*) 43 - 77 %   Neutro Abs 15.2 (*) 1.7 - 7.7 K/uL   Lymphocytes Relative 3 (*) 12 - 46 %   Lymphs Abs 0.5 (*) 0.7 - 4.0 K/uL   Monocytes Relative 4  3 - 12 %   Monocytes Absolute 0.7  0.1 - 1.0 K/uL   Eosinophils Relative 0  0 - 5 %   Eosinophils Absolute 0.0  0.0 - 0.7 K/uL   Basophils Relative 0  0 - 1 %   Basophils Absolute 0.0  0.0 - 0.1 K/uL  COMPREHENSIVE METABOLIC PANEL      Result Value Range   Sodium 138  135 - 145 mEq/L   Potassium 3.5  3.5 - 5.1 mEq/L   Chloride 100  96 - 112 mEq/L   CO2 29  19 - 32 mEq/L   Glucose, Bld 115 (*) 70 - 99 mg/dL   BUN 9  6 - 23 mg/dL   Creatinine, Ser 1.32  0.50 - 1.10 mg/dL   Calcium 9.7  8.4 - 44.0 mg/dL   Total Protein 7.9  6.0 - 8.3 g/dL    Albumin 3.9  3.5 - 5.2 g/dL   AST 102 (*) 0 - 37 U/L   ALT 447 (*) 0 - 35 U/L   Alkaline Phosphatase 193 (*) 39 - 117 U/L   Total Bilirubin 2.2 (*) 0.3 - 1.2 mg/dL  GFR calc non Af Amer 63 (*) >90 mL/min   GFR calc Af Amer 72 (*) >90 mL/min  LIPASE, BLOOD      Result Value Range   Lipase 24  11 - 59 U/L  TROPONIN I      Result Value Range   Troponin I <0.30  <0.30 ng/mL  URINALYSIS, ROUTINE W REFLEX MICROSCOPIC      Result Value Range   Color, Urine YELLOW  YELLOW   APPearance CLEAR  CLEAR   Specific Gravity, Urine 1.015  1.005 - 1.030   pH 7.5  5.0 - 8.0   Glucose, UA NEGATIVE  NEGATIVE mg/dL   Hgb urine dipstick NEGATIVE  NEGATIVE   Bilirubin Urine NEGATIVE  NEGATIVE   Ketones, ur NEGATIVE  NEGATIVE mg/dL   Protein, ur NEGATIVE  NEGATIVE mg/dL   Urobilinogen, UA 0.2  0.0 - 1.0 mg/dL   Nitrite NEGATIVE  NEGATIVE   Leukocytes, UA NEGATIVE  NEGATIVE    Dg Ercp Biliary & Pancreatic Ducts  05/23/2012  *RADIOLOGY REPORT*  Clinical Data: Common bile duct stones  ERCP with sphincterotomy and stent placement  Comparison:  CT 07/07/2010  Technique:  Multiple spot images obtained with the fluoroscopic device and submitted for interpretation post-procedure.  ERCP was performed by Dr. Jena Gauss.  Fluoroscopy time equal 5 minutes 55 seconds  Findings: Nine fluoroscopic spot images are provided.  Initial image demonstrates endoscope in the duodenum.  There is sphincterotomy and cannulation of the common bile duct which is dilated.  Balloon extraction produces stones.  Common bile duct stent placed.  IMPRESSION: ERCP with sphincterotomy, balloon dilatation, stone extraction, and stent placement.  These images were submitted for radiologic interpretation only. Please see the procedural report for the amount of contrast and the fluoroscopy time utilized.   Original Report Authenticated By: Genevive Bi, M.D.    US Abdomen Limited Ruq  05/23/2012  *RADIOLOGY REPORT*  Clinical Data:  79 year old  female with right upper quadrant pain. Unknown surgical history.  LIMITED ABDOMINAL ULTRASOUND - RIGHT UPPER QUADRANT  Comparison:  CT abdomen and pelvis 06/08/2010.  Findings:  Gallbladder:  Appear to be surgically absent as on the 2012 comparison.  Common bile duct:  Dilated and irregular.  Up to 16 mm diameter. Bulky internal echogenic stones or debris (image 67) up to 8 mm diameter individually.  Liver:  Chronic intrahepatic moderate to severe biliary ductal dilatation, similar to the prior study.  Parenchymal echotexture at the upper limits of normal to mildly increased.  No discrete parenchymal liver lesion identified.  IMPRESSION: Gallbladder appears to be surgically absent and there is chronic severe intra- and extrahepatic biliary ductal dilatation (CBD up to 16 mm diameter) with bulky CBD stones and/or debris. See also CT abdomen and pelvis report 06/08/2010.                    Original Report Authenticated By: Erskine Speed, M.D.      1. Choledochitis   2. Choledocholithiasis with obstruction       MDM  I personally performed the services described in this documentation, which was scribed in my presence. The recorded information has been reviewed and is accurate.  The patient appears reasonably stabilized for admission considering the current resources, flow, and capabilities available in the ED at this time, and I doubt any other [facility] requiring further screening and/or treatment in the ED prior to admission.     Lori Horn, MD 05/23/12 2150

## 2012-05-23 NOTE — Op Note (Signed)
NAME:  Lori Bishop, Lori Bishop                ACCOUNT NO.:  [id number]  MEDICAL RECORD NO.:  [id number]  LOCATION:  [facility]                          FACILITY:  [facility]  PHYSICIAN:  R. Roetta Sessions, MD FACP FACGDATE OF BIRTH:  [birth year]  DATE OF PROCEDURE:  05/23/2012 DATE OF DISCHARGE:                              OPERATIVE REPORT   PROCEDURE:  ERCP with biliary sphincterotomy, sphincterotomy balloon dilation, balloon and basket stone extraction, followed by biliary stent placement.  INDICATIONS FOR PROCEDURE:  An 79 year old lady admitted to hospital with epigastric right upper quadrant abdominal pain.  Ultrasound demonstrated marked dilation of the biliary tree.  It was also suspicious for choledocholithiasis.  Aminotransferases and bilirubin notably elevated.  CT done for trauma 2 years ago also suggested dilated biliary tree and common duct stones.  She is status post cholecystectomy in the very distant past.  ERCP now being done urgently to decompress the biliary tree.  This approach has discussed with the patient, patient's caregiver/family member at length.  Risks, benefits, limitations, alternatives, imponderables have been discussed.  Potential for stent placement which would necessitate subsequent procedure also reviewed.  One in ten chance of pancreatitis also reviewed.  Risk of perforation, bleeding also discussed.  PROCEDURE NOTE:  General endotracheal anesthesia was induced by Dennison Mascot, CRNA.  The patient was placed in the semiprone position on the OR table.  INSTRUMENTATION:  Pentax video chip system.  FINDINGS:  Cursory examination of the distal esophagus and stomach revealed some retained food debris in the stomach.  Otherwise, gastric mucosa appeared grossly normal as did the distal esophageal mucosa .  Pylorus was patent and easily traversed.  Examination of the bulb and second portion revealed a bulging ampulla of Vater straddling on the rim of the duodenum  diverticula.  Please see photos.  I attempted to pull the scope back to the short position about 55 cm from incisors, but I kept falling back.  Subsequently had to stay in the semi-long position for the procedure.  Scout film was taken.  I utilized the [employer].  Ampullary orifice was approached.  Using guidewire palpation,I almost immediately achieved deep biliary cannulation.  Sphincterotome followed up.  A cholangiogram was performed.  There was marked dilation of the intra and extrahepatic segments with multiple filling defects seen.  I did not see a stricture. The pancreatic duct was not injected.  I placed a safety wire deep in the biliary tree and pulled the sphincterotome across the ampullary orifice at the 12 o'clock position.  I performed a 10 mm sphincterotomy.  With this maneuver, there were small cholesterol and pigment stones delivered through the ampullary orifice spontaneously.  I railed a pyloric channel dilating balloon across the ampullary orifice and inflated it to 10 mm and kept there for 1 minute and then took it down.  This was associated with recovery of additional stones spontaneously.  Subsequently, I railed the graduated extractor balloon to the confluence and performed cholangiogram 2 times with recovery of multiple pigment and cholesterol-appearing stones.  I then took the balloon off and placed a 3 wire basket up to the confluence trawled 3 times with recovery of additional stones; then went  back to the balloon and passed it 2 more times.  Each time, multiple stone fragments and sludge recovered through the ampullary orifice.  Stone material kept coming out.  Finally, I placed a 10-French, 5 cm plastic stent into the bile duct. Over the guiding catheter over the safety wire under fluoroscopic Control; it was deployed in good position.  Both the duodenal side and distal ports drained very well after stent deployment.  The patient tolerated the  procedure well and was taken to PACU in stable condition.  IMPRESSION: 1. Juxta-ampullary duodenal diverticulum. 2. Markedly dilated biliary tree with choledocholithiasis, status post     biliary sphincterotomy with biliary sphincterotomy balloon     dilation, balloon and basket stone extraction, and stent placement.  RECOMMENDATIONS: 1. Clear liquid diet.  Overnight recheck LFTs and serum lipase     tomorrow morning. 2. The patient will need a subsequent ERCP with stent change/removal     about 8 weeks from now.  Dr. Karilyn Cota will round on her as needed over the weekend.     Jonathon Bellows, MD Cale[facility] Essex     RMR/MEDQ  D:  05/23/2012  T:  05/23/2012  Job:  [id number]

## 2012-05-23 NOTE — Consult Note (Signed)
Referring Provider: Felecia Shelling Primary Care Physician:  Avon Gully, MD Primary Gastroenterologist:  Dr. Jena Gauss  Reason for Consultation:  Choledocholithiasis  HPI: Lori Bishop is a 79 y.o. female who presented to ER with severe RUQ pain that started at 5am.  C/o nausea & vomiting.  Denies fever.  +chills.  Denies jaundice.  +pruritis.  Weight stable, appetite fair.  Occasionally takes OTC TUMS for heartburn.  C/o chronic constipation & takes PRN Exlax.  Occasional small enemas PRN.  1st episode of pain 1 month ago, lasted 1 hour.  Hx remote cholecystectomy (Cannot remember date).  Bilirubin, AST, ALT & ALK Phos all elevated as below.  Lipase 24.  Gallbladder appears to be surgically absent and there is chronic severe intra- and extrahepatic biliary ductal dilatation (CBD up to 16 mm diameter) with bulky CBD stones and/or debris.  On fentanyl patch for osteoarthritis.    AST 371*  ALT 447*  ALKPHOS 193*  BILITOT 2.2*   Past Medical History  Diagnosis Date  . Gout   . Osteoarthritis   . Leg cramps     Past Surgical History  Procedure Laterality Date  . Cholecystectomy    . Appendectomy    . Cataract extraction Bilateral   . Wrist arthroplasty Right   . Right femur      metal plate  . Breast biopsy Left     Prior to Admission medications   Medication Sig Start Date End Date Taking? Authorizing Provider  allopurinol (ZYLOPRIM) 100 MG tablet Take 100 mg by mouth daily.   Yes Historical Provider, MD  Calcium Carbonate-Vit D-Min (CALTRATE 600+D PLUS MINERALS) 600-800 MG-UNIT TABS Take 1 tablet by mouth 2 (two) times daily.   Yes Historical Provider, MD  felodipine (PLENDIL) 10 MG 24 hr tablet Take 10 mg by mouth daily.   Yes Historical Provider, MD  fentaNYL (DURAGESIC - DOSED MCG/HR) 100 MCG/HR Place 1 patch onto the skin every 3 (three) days.   Yes Historical Provider, MD  Ibuprofen-Diphenhydramine Cit (ADVIL PM) 200-38 MG TABS Take 1 tablet by mouth at bedtime as needed (sleep/pain).    Yes Historical Provider, MD  potassium chloride SA (K-DUR,KLOR-CON) 20 MEQ tablet Take 40 mEq by mouth 2 (two) times daily.   Yes Historical Provider, MD  torsemide (DEMADEX) 20 MG tablet Take 20 mg by mouth daily.   Yes Historical Provider, MD  traMADol-acetaminophen (ULTRACET) 37.5-325 MG per tablet Take 1 tablet by mouth 3 (three) times daily as needed for pain.   Yes Historical Provider, MD    Current Facility-Administered Medications  Medication Dose Route Frequency Provider Last Rate Last Dose  . 0.9 %  sodium chloride infusion   Intravenous Continuous [name], MD 125 mL/hr at 05/23/12 2130     Current Outpatient Prescriptions  Medication Sig Dispense Refill  . allopurinol (ZYLOPRIM) 100 MG tablet Take 100 mg by mouth daily.      . Calcium Carbonate-Vit D-Min (CALTRATE 600+D PLUS MINERALS) 600-800 MG-UNIT TABS Take 1 tablet by mouth 2 (two) times daily.      . felodipine (PLENDIL) 10 MG 24 hr tablet Take 10 mg by mouth daily.      . fentaNYL (DURAGESIC - DOSED MCG/HR) 100 MCG/HR Place 1 patch onto the skin every 3 (three) days.      . Ibuprofen-Diphenhydramine Cit (ADVIL PM) 200-38 MG TABS Take 1 tablet by mouth at bedtime as needed (sleep/pain).      . potassium chloride SA (K-DUR,KLOR-CON) 20 MEQ tablet Take 40 mEq  by mouth 2 (two) times daily.      Marland Kitchen torsemide (DEMADEX) 20 MG tablet Take 20 mg by mouth daily.      . traMADol-acetaminophen (ULTRACET) 37.5-325 MG per tablet Take 1 tablet by mouth 3 (three) times daily as needed for pain.        Allergies as of 05/23/2012  . (No Known Allergies)    Family History:There is no known family history of colorectal carcinoma , liver disease, or inflammatory bowel disease.   Problem Relation Age of Onset  . Stroke Father   . CAD Mother   . Cancer Son     ?metastatic    History   Social History  . Marital Status: Widowed    Spouse Name: N/A    Number of Children: 1  . Years of Education: N/A   Occupational History  .  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 Smoking status: Former Games developer  . Smokeless tobacco: Not on file  . Alcohol Use: No  . Drug Use: No  . Sexually Active: Not on file   Other Topics Concern  . Not on file   Social History Narrative   Lives alone   Lori Bishop, niece & caregiver, as well as POA          Review of Systems: Gen: see HPI CV: Denies chest pain, angina, palpitations, syncope, orthopnea, PND, peripheral edema, and claudication. Resp: Denies dyspnea at rest, dyspnea with exercise, cough, sputum, wheezing, coughing up blood, and pleurisy. GI: SEE HPI GU : Denies urinary burning, blood in urine, urinary frequency, urinary hesitancy, nocturnal urination, and urinary incontinence. MS: Chronic joint pain with OA Derm: Denies rash Psych: Denies depression, anxiety, memory loss, suicidal ideation, hallucinations, paranoia, and confusion. Heme: Denies bruising, bleeding, and enlarged lymph nodes. Neuro:  Denies any headaches, dizziness, paresthesias. Endo:  Denies any problems with DM, thyroid, adrenal function.  Physical Exam: Vital signs in last 24 hours: Temp:  [98.4 F (36.9 C)-99 F (37.2 C)] 99 F (37.2 C) (03/21 1316) Pulse Rate:  [85-98] 95 (03/21 1316) Resp:  [18-22] 20 (03/21 1316) BP: (103-127)/(50-63) 123/63 mmHg (03/21 1316) SpO2:  [98 %-99 %] 98 % (03/21 1316)   No LMP recorded. General:   Alert,  Well-developed, well-nourished, pleasant and cooperative in NAD.  Niece (POA) at bedside. Head:  Normocephalic and atraumatic. Eyes:  Sclera clear, no icterus.   Conjunctiva pink. Ears:  Normal auditory acuity. Nose:  No deformity, discharge, or lesions. Mouth:  Oropharynx pink & moist. Neck:  Supple; no masses or thyromegaly. Lungs:  Clear throughout to auscultation.   No wheezes, crackles, or rhonchi. No acute distress. Heart:  Regular rate and rhythm; no murmurs, clicks, rubs,  or gallops. Abdomen:  Normal bowel sounds.  No bruits.  Soft,  non-distended.  +TTP RUQ/epigastric area.  No masses, hepatosplenomegaly or hernias noted.   Rectal:  Deferred. Msk:  Symmetrical without gross deformities. Pulses:  Normal pulses noted. Extremities:  No clubbing or edema. Neurologic:  Alert and oriented x4;  grossly normal neurologically. Skin:  Intact without significant lesions or rashes. Lymph Nodes:  No significant cervical adenopathy. Psych:  Alert and cooperative. Normal mood and affect.  Intake/Output from previous day:   Intake/Output this shift:    Lab Results:  Recent Labs  05/23/12 0904  WBC 16.4*  HGB 14.4  HCT 41.3  PLT 195   BMET  Recent Labs  05/23/12 0904  NA 138  K 3.5  CL 100  CO2 29  GLUCOSE 115*  BUN 9  CREATININE 0.81  CALCIUM 9.7   LFT  Recent Labs  05/23/12 0904  PROT 7.9  ALBUMIN 3.9  AST 371*  ALT 447*  ALKPHOS 193*  BILITOT 2.2*  LIPASE 24    Studies/Results: US Abdomen Limited Ruq  05/23/2012  *RADIOLOGY REPORT*  Clinical Data:  79 year old female with right upper quadrant pain. Unknown surgical history.  LIMITED ABDOMINAL ULTRASOUND - RIGHT UPPER QUADRANT  Comparison:  CT abdomen and pelvis 06/08/2010.  Findings:  Gallbladder:  Appear to be surgically absent as on the 2012 comparison.  Common bile duct:  Dilated and irregular.  Up to 16 mm diameter. Bulky internal echogenic stones or debris (image 67) up to 8 mm diameter individually.  Liver:  Chronic intrahepatic moderate to severe biliary ductal dilatation, similar to the prior study.  Parenchymal echotexture at the upper limits of normal to mildly increased.  No discrete parenchymal liver lesion identified.  IMPRESSION: Gallbladder appears to be surgically absent and there is chronic severe intra- and extrahepatic biliary ductal dilatation (CBD up to 16 mm diameter) with bulky CBD stones and/or debris. See also CT abdomen and pelvis report 06/08/2010.                    Original Report Authenticated By: Erskine Speed, M.D.      Impression: Lori Bishop is a pleasant 79 y.o. female with acute RUQ pain, nausea & vomiting.  Ultrasound shows severe intra &extra-hepatic biliary dilaiton (CBD 16mm).  Choledocholithiasis &/or debris seen on ultrasound.  LFTS elevated in obstructive pattern.  No evidence of pancreatitis.  Pt has been NPO.  She will need ERCP with stone extraction, possible sphincterotomy or stent placement.  The risks, benefits, limitations, alternatives, and imponderables have been reviewed with the patient & her niece. I specifically discussed a1 in 10 chance of pancreatitis, reaction to medications, bleeding, perforation and the possibility of a failed ERCP. Potential for sphincterotomy and stent placement also reviewed. Questions have been answered. All parties agreeable.  Plan: 1. Agree with Unasyn, received 1st dose.  2. ERCP as above today w/ Dr Jena Gauss 3. Pt is NPO 4. Continue supportive measures for nausea & pain control  LOS: 0 days   Lorenza Burton  05/23/2012, 1:23 PM [facility]  Patient seen in short stay. Discussed ERCP with sphincterotomy stent placement and potential for subsequent procedure. We talked about the risk of procedure including pancreatitis. We talked about the alternatives including biliary bypass. Questions have been answered. All parties agreeable.

## 2012-05-23 NOTE — Anesthesia Procedure Notes (Signed)
Procedure Name: Intubation Date/Time: 05/23/2012 2:34 PM Performed by: Franco Nones Pre-anesthesia Checklist: Patient identified, Patient being monitored, Timeout performed, Emergency Drugs available and Suction available Patient Re-evaluated:Patient Re-evaluated prior to inductionOxygen Delivery Method: Circle System Utilized Preoxygenation: Pre-oxygenation with 100% oxygen Intubation Type: IV induction, Rapid sequence and Cricoid Pressure applied Laryngoscope Size: Miller and 2 Grade View: Grade I Tube type: Oral Tube size: 7.0 mm Number of attempts: 1 Airway Equipment and Method: stylet Placement Confirmation: ETT inserted through vocal cords under direct vision,  positive ETCO2 and breath sounds checked- equal and bilateral Secured at: 21 cm Tube secured with: Tape Dental Injury: Teeth and Oropharynx as per pre-operative assessment

## 2012-05-23 NOTE — ED Notes (Signed)
Patient being transported to OR. Caregiver with patient.

## 2012-05-24 DIAGNOSIS — K8001 Calculus of gallbladder with acute cholecystitis with obstruction: Secondary | ICD-10-CM

## 2012-05-24 DIAGNOSIS — K8309 Other cholangitis: Secondary | ICD-10-CM

## 2012-05-24 LAB — HEPATIC FUNCTION PANEL
AST: 148 U/L — ABNORMAL HIGH (ref 0–37)
Albumin: 2.9 g/dL — ABNORMAL LOW (ref 3.5–5.2)
Total Bilirubin: 1.9 mg/dL — ABNORMAL HIGH (ref 0.3–1.2)
Total Protein: 6.7 g/dL (ref 6.0–8.3)

## 2012-05-24 MED ORDER — POLYETHYLENE GLYCOL 3350 17 G PO PACK
17.0000 g | PACK | Freq: Every day | ORAL | Status: AC
  Administered 2012-05-24 – 2012-05-25 (×2): 17 g via ORAL
  Filled 2012-05-24 (×2): qty 1

## 2012-05-24 MED ORDER — HYDROMORPHONE HCL PF 1 MG/ML IJ SOLN
0.5000 mg | INTRAMUSCULAR | Status: DC | PRN
  Administered 2012-05-24 – 2012-05-27 (×20): 0.5 mg via INTRAVENOUS
  Filled 2012-05-24 (×20): qty 1

## 2012-05-24 MED ORDER — MENTHOL 3 MG MT LOZG
1.0000 | LOZENGE | OROMUCOSAL | Status: DC | PRN
Start: 2012-05-24 — End: 2012-05-27
  Administered 2012-05-24 (×2): 3 mg via ORAL
  Filled 2012-05-24 (×2): qty 9

## 2012-05-24 NOTE — Progress Notes (Signed)
Subjective: Patient was admitted yesterday due nausea, vomiting and abdominal pain. She was found to have dilated CBD with multiple stones. Patient underwent ERCP with biliary sphincterotomy, stone extraction and stent placement.   Objective: Vital signs in last 24 hours: Temp:  [97.7 F (36.5 C)-99.8 F (37.7 C)] 98.1 F (36.7 C) (03/22 0500) Pulse Rate:  [67-98] 67 (03/22 0500) Resp:  [13-25] 15 (03/22 0500) BP: (111-149)/(56-80) 121/56 mmHg (03/22 0500) SpO2:  [93 %-100 %] 98 % (03/22 0500) Weight:  [64.411 kg (142 lb)] 64.411 kg (142 lb) (03/21 1356) Weight change:  Last BM Date: 05/21/12  Intake/Output from previous day: 03/21 0701 - 03/22 0700 In: 1300 [I.V.:1300] Out: 650 [Urine:650]  PHYSICAL EXAM General appearance: alert and no distress Resp: clear to auscultation bilaterally Cardio: S1, S2 normal GI: tenderness of RUQ, bowel sound + Extremities: extremities normal, atraumatic, no cyanosis or edema  Lab Results:    @labtest @ [facility] No results found for this basename: PHART, PCO2, PO2ART, TCO2, HCO3,  in the last 72 hours CULTURES No results found for this or any previous visit (from the past 240 hour(s)). Studies/Results: Dg Ercp Biliary & Pancreatic Ducts  05/23/2012  *RADIOLOGY REPORT*  Clinical Data: Common bile duct stones  ERCP with sphincterotomy and stent placement  Comparison:  CT 07/07/2010  Technique:  Multiple spot images obtained with the fluoroscopic device and submitted for interpretation post-procedure.  ERCP was performed by Dr. Jena Gauss.  Fluoroscopy time equal 5 minutes 55 seconds  Findings: Nine fluoroscopic spot images are provided.  Initial image demonstrates endoscope in the duodenum.  There is sphincterotomy and cannulation of the common bile duct which is dilated.  Balloon extraction produces stones.  Common bile duct stent placed.  IMPRESSION: ERCP with sphincterotomy, balloon dilatation, stone extraction, and stent placement.  These images were  submitted for radiologic interpretation only. Please see the procedural report for the amount of contrast and the fluoroscopy time utilized.   Original Report Authenticated By: Genevive Bi, M.D.    US Abdomen Limited Ruq  05/23/2012  *RADIOLOGY REPORT*  Clinical Data:  79 year old female with right upper quadrant pain. Unknown surgical history.  LIMITED ABDOMINAL ULTRASOUND - RIGHT UPPER QUADRANT  Comparison:  CT abdomen and pelvis 06/08/2010.  Findings:  Gallbladder:  Appear to be surgically absent as on the 2012 comparison.  Common bile duct:  Dilated and irregular.  Up to 16 mm diameter. Bulky internal echogenic stones or debris (image 67) up to 8 mm diameter individually.  Liver:  Chronic intrahepatic moderate to severe biliary ductal dilatation, similar to the prior study.  Parenchymal echotexture at the upper limits of normal to mildly increased.  No discrete parenchymal liver lesion identified.  IMPRESSION: Gallbladder appears to be surgically absent and there is chronic severe intra- and extrahepatic biliary ductal dilatation (CBD up to 16 mm diameter) with bulky CBD stones and/or debris. See also CT abdomen and pelvis report 06/08/2010.                    Original Report Authenticated By: Erskine Speed, M.D.     Medications: I have reviewed the patient's current medications.  Assesment: 1. Choledocholithiasis 2.elevated LFT secondary to the above 3. S/P ERCP with biliary sphincterotomy, stone extraction and stent palcement Active Problems:   * No active hospital problems. *    Plan: Continue Iv antibiotics As per GI recommendations.    LOS: 1 day   Lori Bishop 05/24/2012, 10:47 AM

## 2012-05-24 NOTE — Progress Notes (Signed)
Subjective; Patient continues to complain of right upper quadrant pain radiating posteriorly. She states pain is not as intense as it was yesterday. Pain medications helping. Last dose was about 3 hours ago. She denies shortness of breath chest pain nausea or vomiting. She feels constipated. Objective; BP 121/56  Pulse 67  Temp(Src) 98.1 F (36.7 C) (Oral)  Resp 15  Ht 5' (1.524 m)  Wt 142 lb (64.411 kg)  BMI 27.73 kg/m2  SpO2 98% Patient is alert and appears comfortable. Cardiac exam with regular rhythm normal S1 and S2. No murmur noted. Lungs clear to auscultation. Abdomen is full. Bowel sounds are normal. Soft abdomen with mild to moderate tenderness below right costal margin. No organomegaly or masses. No LE edema. Lab data; Bilirubin 1.9, AP 156, AST 148, ALT 265 and albumin 2.9 Assessment; Acute cholangitis secondary to choledocholithiasis. Status post ERCP with sphincterotomy stone extraction and biliary stent placement by Dr. Jena Gauss yesterday. Significant drop in transaminases. Bilirubin and AP are also down. Recommendations. Will check serum amylase. Clear liquids. CBC and LFTs in a.m. MiraLax 2 doses for constipation

## 2012-05-24 NOTE — H&P (Signed)
Lori Bishop MRN: [id number] DOB/AGE: 1920/07/23 79 y.o. Primary Care Physician:Coren Sagan, MD Admit date: 05/23/2012 Chief Complaint:  Abdominal pain, nausea and vomiting HPI:  This is an 79 years old female patient with history of multiple medical illnesses came with the above complaint. She had cholecystectomy several ago. Patient had occasional nausea and vomiting. However, she had severe pain, nausea and vomiting on the day of admission. She had CT Scan of the abdomen which showed dilatation of CBD with multiple stone. Patient was admitted and under went ERCP. Biliary sphincterotomy, stone extraction and stent placement was done.  No fever, chills, cough, chest pain, SOB, dysuria, urgency or frequency.  Past Medical History  Diagnosis Date  . Gout   . Osteoarthritis   . Leg cramps    Past Surgical History  Procedure Laterality Date  . Cholecystectomy    . Appendectomy    . Cataract extraction Bilateral   . Wrist arthroplasty Right   . Right femur      metal plate  . Breast biopsy Left         Family History  Problem Relation Age of Onset  . Stroke Father   . CAD Mother   . Cancer Son     ?metastatic    Social History:  reports that she has quit smoking. She does not have any smokeless tobacco history on file. She reports that she does not drink alcohol or use illicit drugs.   Allergies: No Known Allergies  Medications Prior to Admission  Medication Sig Dispense Refill  . allopurinol (ZYLOPRIM) 100 MG tablet Take 100 mg by mouth daily.      . Calcium Carbonate-Vit D-Min (CALTRATE 600+D PLUS MINERALS) 600-800 MG-UNIT TABS Take 1 tablet by mouth 2 (two) times daily.      . felodipine (PLENDIL) 10 MG 24 hr tablet Take 10 mg by mouth daily.      . fentaNYL (DURAGESIC - DOSED MCG/HR) 100 MCG/HR Place 1 patch onto the skin every 3 (three) days.      . potassium chloride SA (K-DUR,KLOR-CON) 20 MEQ tablet Take 40 mEq by mouth 2 (two) times daily.      Marland Kitchen torsemide  (DEMADEX) 20 MG tablet Take 20 mg by mouth daily.      . traMADol-acetaminophen (ULTRACET) 37.5-325 MG per tablet Take 1 tablet by mouth 3 (three) times daily as needed for pain.      . Ibuprofen-Diphenhydramine Cit (ADVIL PM) 200-38 MG TABS Take 1 tablet by mouth at bedtime as needed (sleep/pain).           WUJ:WJXBJ from the symptoms mentioned above,there are no other symptoms referable to all systems reviewed.  Physical Exam: Blood pressure 113/48, pulse 72, temperature 98.8 F (37.1 C), temperature source Oral, resp. rate 16, height 5' (1.524 m), weight 64.411 kg (142 lb), SpO2 96.00%. General Condition- alert,awake but acutely sicklooking HE ENT- pupils equal and reactive, neck supple Respiratory- clear lung field CVS- s1 and s2 heard, regular ABD- RUQ moderate tenderness, bowel sound++ EXT- no leg edema     Recent Labs  05/23/12 0904  WBC 16.4*  NEUTROABS 15.2*  HGB 14.4  HCT 41.3  MCV 83.1  PLT 195    Recent Labs  05/23/12 0904  NA 138  K 3.5  CL 100  CO2 29  GLUCOSE 115*  BUN 9  CREATININE 0.81  CALCIUM 9.7  lablast2(ast:2,ALT:2,alkphos:2,bilitot:2,prot:2,albumin:2)@    No results found for this or any previous visit (from the past 240 hour(s)).  Dg Ercp Biliary & Pancreatic Ducts  05/23/2012  *RADIOLOGY REPORT*  Clinical Data: Common bile duct stones  ERCP with sphincterotomy and stent placement  Comparison:  CT 07/07/2010  Technique:  Multiple spot images obtained with the fluoroscopic device and submitted for interpretation post-procedure.  ERCP was performed by Dr. Jena Gauss.  Fluoroscopy time equal 5 minutes 55 seconds  Findings: Nine fluoroscopic spot images are provided.  Initial image demonstrates endoscope in the duodenum.  There is sphincterotomy and cannulation of the common bile duct which is dilated.  Balloon extraction produces stones.  Common bile duct stent placed.  IMPRESSION: ERCP with sphincterotomy, balloon dilatation, stone extraction, and  stent placement.  These images were submitted for radiologic interpretation only. Please see the procedural report for the amount of contrast and the fluoroscopy time utilized.   Original Report Authenticated By: Genevive Bi, M.D.    US Abdomen Limited Ruq  05/23/2012  *RADIOLOGY REPORT*  Clinical Data:  79 year old female with right upper quadrant pain. Unknown surgical history.  LIMITED ABDOMINAL ULTRASOUND - RIGHT UPPER QUADRANT  Comparison:  CT abdomen and pelvis 06/08/2010.  Findings:  Gallbladder:  Appear to be surgically absent as on the 2012 comparison.  Common bile duct:  Dilated and irregular.  Up to 16 mm diameter. Bulky internal echogenic stones or debris (image 67) up to 8 mm diameter individually.  Liver:  Chronic intrahepatic moderate to severe biliary ductal dilatation, similar to the prior study.  Parenchymal echotexture at the upper limits of normal to mildly increased.  No discrete parenchymal liver lesion identified.  IMPRESSION: Gallbladder appears to be surgically absent and there is chronic severe intra- and extrahepatic biliary ductal dilatation (CBD up to 16 mm diameter) with bulky CBD stones and/or debris. See also CT abdomen and pelvis report 06/08/2010.                    Original Report Authenticated By: Erskine Speed, M.D.    Impression: 1.Acute Cholangitis with choledocholithiasis  2.elevated LFT secondary to the above  3. S/P ERCP with biliary sphincterotomy, stone extraction and stent palcement   Active Problems:   * No active hospital problems. *     Plan: GI cpnsult appreciated Continue iv antibiotics Continue pain management Continue supportive care      [facility] Pager [phone]  05/24/2012, 10:05 PM

## 2012-05-25 LAB — COMPREHENSIVE METABOLIC PANEL
AST: 64 U/L — ABNORMAL HIGH (ref 0–37)
BUN: 5 mg/dL — ABNORMAL LOW (ref 6–23)
CO2: 32 mEq/L (ref 19–32)
Calcium: 8.8 mg/dL (ref 8.4–10.5)
Chloride: 102 mEq/L (ref 96–112)
Creatinine, Ser: 0.87 mg/dL (ref 0.50–1.10)
GFR calc Af Amer: 66 mL/min — ABNORMAL LOW (ref >90)
GFR calc non Af Amer: 57 mL/min — ABNORMAL LOW (ref >90)
Glucose, Bld: 96 mg/dL (ref 70–99)
Total Bilirubin: 0.8 mg/dL (ref 0.3–1.2)

## 2012-05-25 LAB — CBC
Hemoglobin: 13 g/dL (ref 12.0–15.0)
MCH: 28.3 pg (ref 26.0–34.0)
Platelets: 208 10*3/uL (ref 150–400)
RBC: 4.59 MIL/uL (ref 3.87–5.11)

## 2012-05-25 MED ORDER — FLEET ENEMA 7-19 GM/118ML RE ENEM
1.0000 | ENEMA | Freq: Once | RECTAL | Status: AC
  Administered 2012-05-25: 1 via RECTAL

## 2012-05-25 MED ORDER — BISACODYL 10 MG RE SUPP
10.0000 mg | Freq: Once | RECTAL | Status: AC
  Administered 2012-05-25: 10 mg via RECTAL
  Filled 2012-05-25: qty 1

## 2012-05-25 NOTE — Progress Notes (Signed)
Subjective; Patient continues to complain of pain under the right rib cage. She now rates his pain as 8. She says is slightly decreased since yesterday. She denies nausea vomiting or epigastric pain. She still hasn't had a bowel movement. She is drinking MiraLax. Objective; BP 146/66  Pulse 71  Temp(Src) 98 F (36.7 C) (Oral)  Resp 18  Ht 5' (1.524 m)  Wt 142 lb (64.411 kg)  BMI 27.73 kg/m2  SpO2 92% Patient is alert and in no acute distress. She is sitting in a reclining chair. Cardiac exam with regular rhythm normal S1 and S2. No murmur or gallop noted. Lungs are clear to auscultation. Abdomen with normal bowel sounds. Soft abdomen with mild tenderness below right costal margin. No organomegaly or masses. No LE edema noted. Lab data; Serum amylase 51 WBC 15.9, H&H 13 and 38.8 and platelet count 208 K. Electrolytes within normal limits. Glucose 96, BUN 5, creatinine oh 0.87. Bilirubin oh 0.8, AP 140, AST 64, ALT 170, albumin 2.8 and calcium 8.8. Assessment; #1. Acute cholangitis secondary to multiple stones in bile duct. Status post ERCP with sphincterotomy stone extraction and stent placement 2 days ago. Continued improvement in transaminases. Leukocytosis improving. #2. Right upper quadrant pain appears to be referred pain from her back rather than biliary colic. #3. Constipation. Recommendations; Decrease IV fluid rate. Advance diet. CBC and LFTs in a.m.. MiraLax 17 g by mouth daily. If she has no difficulty with diet will switch her to by mouth antibiotic later today.

## 2012-05-25 NOTE — Progress Notes (Signed)
Subjective: Patient feels better. She is started on solid food. Her abdominal pain is less. No nausea or vomiting.  Objective: Vital signs in last 24 hours: Temp:  [97.9 F (36.6 C)-98.8 F (37.1 C)] 97.9 F (36.6 C) (03/23 1300) Pulse Rate:  [70-72] 72 (03/23 1300) Resp:  [16-18] 18 (03/23 1300) BP: (113-146)/(48-66) 125/65 mmHg (03/23 1300) SpO2:  [92 %-100 %] 97 % (03/23 1300) Weight change:  Last BM Date: 05/21/12  Intake/Output from previous day:    PHYSICAL EXAM General appearance: alert and no distress Resp: clear to auscultation bilaterally Cardio: S1, S2 normal GI: tenderness of RUQ, bowel sound + Extremities: extremities normal, atraumatic, no cyanosis or edema  Lab Results:    @labtest @ [facility] No results found for this basename: PHART, PCO2, PO2ART, TCO2, HCO3,  in the last 72 hours CULTURES No results found for this or any previous visit (from the past 240 hour(s)). Studies/Results: Dg Ercp Biliary & Pancreatic Ducts  05/23/2012  *RADIOLOGY REPORT*  Clinical Data: Common bile duct stones  ERCP with sphincterotomy and stent placement  Comparison:  CT 07/07/2010  Technique:  Multiple spot images obtained with the fluoroscopic device and submitted for interpretation post-procedure.  ERCP was performed by Dr. Jena Gauss.  Fluoroscopy time equal 5 minutes 55 seconds  Findings: Nine fluoroscopic spot images are provided.  Initial image demonstrates endoscope in the duodenum.  There is sphincterotomy and cannulation of the common bile duct which is dilated.  Balloon extraction produces stones.  Common bile duct stent placed.  IMPRESSION: ERCP with sphincterotomy, balloon dilatation, stone extraction, and stent placement.  These images were submitted for radiologic interpretation only. Please see the procedural report for the amount of contrast and the fluoroscopy time utilized.   Original Report Authenticated By: Genevive Bi, M.D.     Medications: I have reviewed the  patient's current medications.  Assesment: 1.Acule Cholangitis with Choledocholithiasis 2.elevated LFT secondary to the above 3. S/P ERCP with biliary sphincterotomy, stone extraction and stent palcement Active Problems:   * No active hospital problems. *    Plan: Continue Iv antibiotics As per GI recommendations.    LOS: 2 days   Cerrone Debold 05/25/2012, 2:12 PM

## 2012-05-25 NOTE — Progress Notes (Signed)
Gave patient ordered fleets enema with no results. Informed patient that it could be because she hasn't been eating solid foods for the last few days. Today is her first day on solid foods since procedure so will continue to monitor bm status.

## 2012-05-25 NOTE — Anesthesia Postprocedure Evaluation (Signed)
  Anesthesia Post-op Note  Patient: Lori Bishop  Procedure(s) Performed: Procedure(s): ENDOSCOPIC RETROGRADE CHOLANGIOPANCREATOGRAPHY (ERCP) BALLOON AND BASKET REMOVAL OF STONES SPHINCTEROTOMY BILIARY STENT PLACEMENT AMPULLARY BALLOON DILATION  Patient Location: Nursing Unit  Anesthesia Type:General  Level of Consciousness: awake, alert , oriented and patient cooperative  Airway and Oxygen Therapy: Patient Spontanous Breathing  Post-op Pain: moderate  Post-op Assessment: Post-op Vital signs reviewed, Patient's Cardiovascular Status Stable, Respiratory Function Stable and No signs of Nausea or vomiting  Post-op Vital Signs: Reviewed and stable  Complications: No apparent anesthesia complications C/O back pain. Increasing po intake today.

## 2012-05-26 ENCOUNTER — Encounter (HOSPITAL_COMMUNITY): Payer: Self-pay | Admitting: Internal Medicine

## 2012-05-26 DIAGNOSIS — K8050 Calculus of bile duct without cholangitis or cholecystitis without obstruction: Secondary | ICD-10-CM

## 2012-05-26 LAB — HEPATIC FUNCTION PANEL
ALT: 115 U/L — ABNORMAL HIGH (ref 0–35)
AST: 36 U/L (ref 0–37)
Albumin: 2.8 g/dL — ABNORMAL LOW (ref 3.5–5.2)
Total Bilirubin: 0.7 mg/dL (ref 0.3–1.2)

## 2012-05-26 LAB — CBC
HCT: 38 % (ref 36.0–46.0)
MCV: 83.7 fL (ref 78.0–100.0)
Platelets: 212 10*3/uL (ref 150–400)
RBC: 4.54 MIL/uL (ref 3.87–5.11)
RDW: 15.1 % (ref 11.5–15.5)
WBC: 12.3 10*3/uL — ABNORMAL HIGH (ref 4.0–10.5)

## 2012-05-26 MED ORDER — POLYETHYLENE GLYCOL 3350 17 G PO PACK
17.0000 g | PACK | Freq: Every day | ORAL | Status: DC
  Administered 2012-05-26 – 2012-05-27 (×2): 17 g via ORAL
  Filled 2012-05-26 (×2): qty 1

## 2012-05-26 NOTE — Progress Notes (Addendum)
Subjective: Continued RUQ discomfort but improving. Tolerating breakfast. Had large BM this morning. States still has decreased appetite.   Objective: Vital signs in last 24 hours: Temp:  [97.5 F (36.4 C)-98 F (36.7 C)] 97.5 F (36.4 C) (03/24 0500) Pulse Rate:  [61-73] 61 (03/24 0500) Resp:  [16-20] 16 (03/24 0500) BP: (110-125)/(44-65) 121/54 mmHg (03/24 0500) SpO2:  [92 %-97 %] 97 % (03/24 0729) Last BM Date: 05/25/12 General:   Alert and oriented, pleasant Head:  Normocephalic and atraumatic. Eyes:  No icterus, sclera clear. Conjuctiva pink.  Mouth:  Without lesions, mucosa pink and moist.  Heart:  S1, S2 present, no murmurs noted.  Lungs: Clear to auscultation bilaterally, without wheezing, rales, or rhonchi.  Abdomen:  Bowel sounds present, soft, TTP RUQ, non-distended. No HSM or hernias noted. No rebound or guarding. No masses appreciated  Extremities:  Without clubbing or edema. Neurologic:  Alert and  oriented x4;  grossly normal neurologically. Skin:  Warm and dry, intact without significant lesions.  Psych:  Alert and cooperative. Normal mood and affect.  Intake/Output from previous day: 03/23 0701 - 03/24 0700 In: 720 [P.O.:720] Out: -  Intake/Output this shift:    Lab Results:  Recent Labs  05/23/12 0904 05/25/12 0630 05/26/12 0453  WBC 16.4* 15.9* 12.3*  HGB 14.4 13.0 12.8  HCT 41.3 38.8 38.0  PLT 195 208 212   BMET  Recent Labs  05/23/12 0904 05/25/12 0630  NA 138 140  K 3.5 4.2  CL 100 102  CO2 29 32  GLUCOSE 115* 96  BUN 9 5*  CREATININE 0.81 0.87  CALCIUM 9.7 8.8   LFT  Recent Labs  05/24/12 0615 05/25/12 0630 05/26/12 0453  PROT 6.7 6.4 6.5  ALBUMIN 2.9* 2.8* 2.8*  AST 148* 64* 36  ALT 265* 170* 115*  ALKPHOS 156* 140* 127*  BILITOT 1.9* 0.8 0.7  BILIDIR 1.2*  --  0.3  IBILI 0.7  --  0.4    Assessment: 79 year old female admitted with cholangitis secondary to choledocholithiasis, s/p ERCP with sphincterotomy, stone  extraction, stent placement. LFTs continue to improve, leukocytosis improving. Patient with continued but lessened RUQ discomfort but tolerating diet. BM yesterday.     Plan: Outpatient follow-up in our office in 6 weeks ERCP with stent change/removal in about 8 weeks Miralax prn daily for constipation Supportive measures Hopeful d/c in near future   LOS: 3 days    05/26/2012, 7:52 AM  Attending note  She looks much better than when I saw her 3 days ago. Her RUQ pain has steadily improved (I agree with Dr. Karilyn Cota, there is a musculoskeletal component ); LFT's normalizing.  I anticipate hospital discharge within the next 24 hours per Dr. Felecia Shelling. I would also recommend antibiotics be discontinued by discharge. I'll arrange a followup appointment with [place] in 6 weeks.

## 2012-05-26 NOTE — Progress Notes (Signed)
Subjective: Patient complains of constipation. Her abdominal pain is improving. No fever  Or chills. She is tolerating regular diet.  Objective: Vital signs in last 24 hours: Temp:  [97.5 F (36.4 C)-98 F (36.7 C)] 97.5 F (36.4 C) (03/24 0500) Pulse Rate:  [61-73] 61 (03/24 0500) Resp:  [16-20] 16 (03/24 0500) BP: (110-125)/(44-65) 121/54 mmHg (03/24 0500) SpO2:  [92 %-97 %] 97 % (03/24 0729) Weight change:  Last BM Date: 05/25/12  Intake/Output from previous day: 03/23 0701 - 03/24 0700 In: 720 [P.O.:720] Out: -   PHYSICAL EXAM General appearance: alert and no distress Resp: clear to auscultation bilaterally Cardio: S1, S2 normal GI: tenderness of RUQ, bowel sound + Extremities: extremities normal, atraumatic, no cyanosis or edema  Lab Results:    @labtest @ [facility] No results found for this basename: PHART, PCO2, PO2ART, TCO2, HCO3,  in the last 72 hours CULTURES No results found for this or any previous visit (from the past 240 hour(s)). Studies/Results: No results found.  Medications: I have reviewed the patient's current medications.  Assesment: 1.Acule Cholangitis with Choledocholithiasis 2.elevated LFT secondary to the above 3. S/P ERCP with biliary sphincterotomy, stone extraction and stent palcement Active Problems:   * No active hospital problems. *    Plan: Continue Iv antibiotics Will start on laxatives As per GI recommendations.    LOS: 3 days   Raychell Holcomb 05/26/2012, 7:55 AM

## 2012-05-26 NOTE — Care Management Note (Signed)
    Page 1 of 1   05/27/2012     10:28:18 AM   CARE MANAGEMENT NOTE 05/27/2012  Patient:  Lori Bishop, Lori Bishop   Account Number:  [id number]  Date Initiated:  05/26/2012  Documentation initiated by:  Sharrie Rothman  Subjective/Objective Assessment:   Pt admitted from home with abd pain and nausea and vomiting. Pt currently lives alone but has Bishop niece that is very active in the care of the pt. Pt has Bishop walker for home use.     Action/Plan:   No CM needs noted. Pts niece is going to come stay with pt for Bishop while at discharge.   Anticipated DC Date:  05/28/2012   Anticipated DC Plan:  HOME/SELF CARE      DC Planning Services  CM consult      Choice offered to / List presented to:             Status of service:  Completed, signed off Medicare Important Message given?  YES (If response is "NO", the following Medicare IM given date fields will be blank) Date Medicare IM given:  05/27/2012 Date Additional Medicare IM given:    Discharge Disposition:  HOME/SELF CARE  Per UR Regulation:    If discussed at Long Length of Stay Meetings, dates discussed:    Comments:  05/27/12 1030 Arlyss Queen, RN BSN CM Pt discharged home today. No CM needs noted.  05/26/12 1515 Arlyss Queen, RN BSN CM

## 2012-05-26 NOTE — Progress Notes (Signed)
Patient does not have VTE, Dr. Felecia Shelling paged x2, no call back. Oncoming nurse made aware.

## 2012-05-27 ENCOUNTER — Telehealth: Payer: Self-pay | Admitting: Gastroenterology

## 2012-05-27 LAB — BASIC METABOLIC PANEL
BUN: 7 mg/dL (ref 6–23)
CO2: 34 mEq/L — ABNORMAL HIGH (ref 19–32)
Calcium: 9.8 mg/dL (ref 8.4–10.5)
Creatinine, Ser: 0.9 mg/dL (ref 0.50–1.10)
GFR calc non Af Amer: 55 mL/min — ABNORMAL LOW (ref >90)
Glucose, Bld: 101 mg/dL — ABNORMAL HIGH (ref 70–99)

## 2012-05-27 LAB — CBC
HCT: 39.9 % (ref 36.0–46.0)
Hemoglobin: 13.5 g/dL (ref 12.0–15.0)
MCH: 28.2 pg (ref 26.0–34.0)
MCHC: 33.8 g/dL (ref 30.0–36.0)
MCV: 83.5 fL (ref 78.0–100.0)
Platelets: 232 K/uL (ref 150–400)
RBC: 4.78 MIL/uL (ref 3.87–5.11)
RDW: 14.8 % (ref 11.5–15.5)
WBC: 10.5 K/uL (ref 4.0–10.5)

## 2012-05-27 NOTE — Progress Notes (Signed)
Pt and her niece verbalize understanding of d/c instructions and follow up appts. All questions answered. IV d/c.Pt d/c via wheelchair by NT, accompanied by her niece and nephew. Sheryn Bison

## 2012-05-27 NOTE — Progress Notes (Signed)
Physician Discharge Summary  Patient ID: Lori Bishop MRN: [id number] DOB/AGE: [birth year] 79 y.o. Primary Care Physician:Amarius Toto, MD Admit date: 05/23/2012 Discharge date: 05/27/2012    Discharge Diagnoses:  1.Acule Cholangitis with Choledocholithiasis  2.elevated LFT secondary to the above  3. S/P ERCP with biliary sphincterotomy, stone extraction and stent palcement   Active Problems:   * No active hospital problems. *     Medication List    TAKE these medications       ADVIL PM 200-38 MG Tabs  Generic drug:  Ibuprofen-Diphenhydramine Cit  Take 1 tablet by mouth at bedtime as needed (sleep/pain).     allopurinol 100 MG tablet  Commonly known as:  ZYLOPRIM  Take 100 mg by mouth daily.     CALTRATE 600+D PLUS MINERALS 600-800 MG-UNIT Tabs  Take 1 tablet by mouth 2 (two) times daily.     felodipine 10 MG 24 hr tablet  Commonly known as:  PLENDIL  Take 10 mg by mouth daily.     fentaNYL 100 MCG/HR  Commonly known as:  DURAGESIC - dosed mcg/hr  Place 1 patch onto the skin every 3 (three) days.     potassium chloride SA 20 MEQ tablet  Commonly known as:  K-DUR,KLOR-CON  Take 40 mEq by mouth 2 (two) times daily.     torsemide 20 MG tablet  Commonly known as:  DEMADEX  Take 20 mg by mouth daily.     traMADol-acetaminophen 37.5-325 MG per tablet  Commonly known as:  ULTRACET  Take 1 tablet by mouth 3 (three) times daily as needed for pain.        Discharged Condition: improved    Consults: GI  Significant Diagnostic Studies: Dg Ercp Biliary & Pancreatic Ducts  05/23/2012  *RADIOLOGY REPORT*  Clinical Data: Common bile duct stones  ERCP with sphincterotomy and stent placement  Comparison:  CT 07/07/2010  Technique:  Multiple spot images obtained with the fluoroscopic device and submitted for interpretation post-procedure.  ERCP was performed by Dr. Jena Gauss.  Fluoroscopy time equal 5 minutes 55 seconds  Findings: Nine fluoroscopic spot images are  provided.  Initial image demonstrates endoscope in the duodenum.  There is sphincterotomy and cannulation of the common bile duct which is dilated.  Balloon extraction produces stones.  Common bile duct stent placed.  IMPRESSION: ERCP with sphincterotomy, balloon dilatation, stone extraction, and stent placement.  These images were submitted for radiologic interpretation only. Please see the procedural report for the amount of contrast and the fluoroscopy time utilized.   Original Report Authenticated By: Genevive Bi, M.D.    US Abdomen Limited Ruq  05/23/2012  *RADIOLOGY REPORT*  Clinical Data:  79 year old female with right upper quadrant pain. Unknown surgical history.  LIMITED ABDOMINAL ULTRASOUND - RIGHT UPPER QUADRANT  Comparison:  CT abdomen and pelvis 06/08/2010.  Findings:  Gallbladder:  Appear to be surgically absent as on the 2012 comparison.  Common bile duct:  Dilated and irregular.  Up to 16 mm diameter. Bulky internal echogenic stones or debris (image 67) up to 8 mm diameter individually.  Liver:  Chronic intrahepatic moderate to severe biliary ductal dilatation, similar to the prior study.  Parenchymal echotexture at the upper limits of normal to mildly increased.  No discrete parenchymal liver lesion identified.  IMPRESSION: Gallbladder appears to be surgically absent and there is chronic severe intra- and extrahepatic biliary ductal dilatation (CBD up to 16 mm diameter) with bulky CBD stones and/or debris. See also CT abdomen and pelvis report 06/08/2010.  Original Report Authenticated By: Erskine Speed, M.D.     Lab Results: Basic Metabolic Panel:  Recent Labs  16/10/96 0630 05/27/12 0531  NA 140 140  K 4.2 4.4  CL 102 101  CO2 32 34*  GLUCOSE 96 101*  BUN 5* 7  CREATININE 0.87 0.90  CALCIUM 8.8 9.8   Liver Function Tests:  Recent Labs  05/25/12 0630 05/26/12 0453  AST 64* 36  ALT 170* 115*  ALKPHOS 140* 127*  BILITOT 0.8 0.7  PROT 6.4 6.5   ALBUMIN 2.8* 2.8*     CBC:  Recent Labs  05/26/12 0453 05/27/12 0531  WBC 12.3* 10.5  HGB 12.8 13.5  HCT 38.0 39.9  MCV 83.7 83.5  PLT 212 232    No results found for this or any previous visit (from the past 240 hour(s)).   Hospital Course:  This is an 79 years old female patient with history of multiple medical illnesses was admitted due to acute cholangitis with choledocholithiasis. Patient under went ERCP with sphincterotomy and stent placement. Patient improved and she is going to be discharged in stable condition.  Discharge Exam: Blood pressure 115/63, pulse 16, temperature 98 F (36.7 C), temperature source Oral, resp. rate 18, height 5' (1.524 m), weight 64.411 kg (142 lb), SpO2 97.00%.    Disposition:  Home.        Follow-up Information   Follow up with [facility], [place] In 1 week.   Contact information:   [address] [place] [address] [phone]       Signed: Chace Bisch  05/27/2012, 8:25 AM

## 2012-05-27 NOTE — Telephone Encounter (Signed)
Patient is being discharged today. Needs appointment with [place] in 6 weeks to set up repeat ERCP.

## 2012-05-27 NOTE — Telephone Encounter (Signed)
Pt aware of OV ?

## 2012-06-02 ENCOUNTER — Other Ambulatory Visit: Payer: Self-pay | Admitting: Internal Medicine

## 2012-06-02 ENCOUNTER — Telehealth: Payer: Self-pay | Admitting: Internal Medicine

## 2012-06-02 ENCOUNTER — Other Ambulatory Visit: Payer: Self-pay

## 2012-06-02 DIAGNOSIS — R110 Nausea: Secondary | ICD-10-CM

## 2012-06-02 DIAGNOSIS — R109 Unspecified abdominal pain: Secondary | ICD-10-CM

## 2012-06-02 LAB — HEPATIC FUNCTION PANEL
ALT: 26 U/L (ref 0–35)
AST: 19 U/L (ref 0–37)
Albumin: 3.6 g/dL (ref 3.5–5.2)
Alkaline Phosphatase: 129 U/L — ABNORMAL HIGH (ref 39–117)
Total Protein: 7.4 g/dL (ref 6.0–8.3)

## 2012-06-02 MED ORDER — ONDANSETRON HCL 4 MG PO TABS: 4.0000 mg | ORAL_TABLET | Freq: Four times a day (QID) | ORAL | Status: DC | PRN

## 2012-06-02 NOTE — Telephone Encounter (Signed)
Spoke with pts caregiver- pt has been doing well since the procedure until yesterday she started to c/o upper abd discomfort. This morning she was c/o nausea and wouldn't eat breakfast. No fever and she is not taking anything for pain or nausea. Pt is already scheduled to come back in May. They want to know if there is anything we can recommend? Or is there anything else they need to do. Please advise.

## 2012-06-02 NOTE — Telephone Encounter (Signed)
pts caregiver is aware, lab order faxed to the lab and rx has been sent to the pharmacy.

## 2012-06-02 NOTE — Telephone Encounter (Signed)
Tried to call caregiver- [facility]

## 2012-06-02 NOTE — Telephone Encounter (Signed)
Let's back off  her diet to clear liquids today. Please call in prescription for Zofran 4 mg orally every 6 hours as needed for nausea. Dispense #20 tablets - no refills. Hepatic function profile today (stat). Further recommendations to follow.

## 2012-06-02 NOTE — Telephone Encounter (Signed)
Pt's caregiver Tonye Royalty) called with some concerns regarding patient still having discomfort and being nauseated. Pt had ERCP done recently. Please call her at [phone], [phone] or [phone]

## 2012-06-16 ENCOUNTER — Telehealth: Payer: Self-pay

## 2012-06-16 NOTE — Telephone Encounter (Signed)
pts caregiver- Bosie Helper- called- pt has appt on 07/07/12 with [facility] to schedule repeat ERCP. Pt still has some discomfort, but its not all the time. They are wanting to know if they need to move up her appt? Please advise.

## 2012-06-17 ENCOUNTER — Other Ambulatory Visit: Payer: Self-pay | Admitting: Gastroenterology

## 2012-06-17 ENCOUNTER — Other Ambulatory Visit: Payer: Self-pay

## 2012-06-17 DIAGNOSIS — R945 Abnormal results of liver function studies: Secondary | ICD-10-CM

## 2012-06-17 DIAGNOSIS — R109 Unspecified abdominal pain: Secondary | ICD-10-CM

## 2012-06-17 NOTE — Telephone Encounter (Signed)
Recheck [facility]FP. We will go from there.

## 2012-06-17 NOTE — Telephone Encounter (Signed)
pts caregiver is aware and lab order has been faxed to the lab.

## 2012-06-18 LAB — HEPATIC FUNCTION PANEL
Albumin: 4.3 g/dL (ref 3.5–5.2)
Alkaline Phosphatase: 94 U/L (ref 39–117)
Total Bilirubin: 0.5 mg/dL (ref 0.3–1.2)
Total Protein: 7.3 g/dL (ref 6.0–8.3)

## 2012-06-19 NOTE — Progress Notes (Signed)
Quick Note:  Please let patient know her LFTs are completely normalized now.  If she continues to have discomfort, let's get her in next week for further evaluation. ______

## 2012-07-04 ENCOUNTER — Telehealth: Payer: Self-pay

## 2012-07-04 NOTE — Telephone Encounter (Signed)
Spoke with patient's niece, Ms. Oretha Milch. Patient had some nausea this morning but better with Zofran. Intermittent RUQ pain a little worse this morning. No fever. She is taking Duragesic patch chronically and Tramadol prn.  I advised to monitor for fever, worsening ruq pain, vomiting. If she develops these sx then go to ER. Otherwise, keep appt for 07/07/12.

## 2012-07-04 NOTE — Telephone Encounter (Signed)
Pt is hurting under her breast where the stones are. Is there anything we could do for her over the weekend. Please advise

## 2012-07-07 ENCOUNTER — Other Ambulatory Visit: Payer: Self-pay | Admitting: Internal Medicine

## 2012-07-07 ENCOUNTER — Encounter: Payer: Self-pay | Admitting: Gastroenterology

## 2012-07-07 ENCOUNTER — Ambulatory Visit (INDEPENDENT_AMBULATORY_CARE_PROVIDER_SITE_OTHER): Payer: Medicare Other | Admitting: Gastroenterology

## 2012-07-07 VITALS — BP 159/77 | HR 83 | Temp 98.4°F | Ht 60.0 in | Wt 136.8 lb

## 2012-07-07 DIAGNOSIS — R1011 Right upper quadrant pain: Secondary | ICD-10-CM

## 2012-07-07 LAB — CREATININE, SERUM: Creat: 0.97 mg/dL (ref 0.50–1.10)

## 2012-07-07 NOTE — Progress Notes (Signed)
Referring Provider: Fanta, Tesfaye, MD Primary Care Physician:  FANTA,TESFAYE, MD Primary GI: Dr. Rourk   Chief Complaint  Patient presents with  . Follow-up    HPI:   79-year-old female who returns today to set up a repeat ERCP with stent change/removal. She was hospitalized at [facility] in March 2014 secondary to choledocholithiasis, s/p ERCP March 21 by Dr. Rourk with sphincterotomy, stone extraction, and stent placement. Her admitting LFTs were significantly elevated, with normalization of LFTs noted as of 4/16.  Notes RUQ pain, right under her ribs, wraps around to back. Intermittent but does not completely leave. Always present, about a 2-3 on pain scale, worsens to a 7-8 at times. Depends on what she eats. Leafy veggies, cabbage worsens. Fried foods worsen. Denies any lack of appetite, weight loss. Decreased oral intake. Nausea intermittent. States this pain has been going on for a few years. No constipation, takes Miralax. RUQ pain not related to bowel habits. It was thought this may be secondary to musculoskeletal etiology while inpatient.    Past Medical History  Diagnosis Date  . Gout   . Osteoarthritis   . Leg cramps     Past Surgical History  Procedure Laterality Date  . Cholecystectomy    . Appendectomy    . Cataract extraction Bilateral   . Wrist arthroplasty Right   . Right femur      metal plate  . Breast biopsy Left   . Ercp  05/23/2012    Procedure: ENDOSCOPIC RETROGRADE CHOLANGIOPANCREATOGRAPHY (ERCP);  Surgeon: Robert M Rourk, MD;  Location: [facility];  Service: Endoscopy;;  . Removal of stones  05/23/2012    Procedure: BALLOON AND BASKET REMOVAL OF STONES;  Surgeon: Robert M Rourk, MD;  Location: [facility];  Service: Endoscopy;;  . Sphincterotomy  05/23/2012    Procedure: SPHINCTEROTOMY;  Surgeon: Robert M Rourk, MD;  Location: [facility];  Service: Endoscopy;;  . Biliary stent placement  05/23/2012    Procedure: BILIARY STENT PLACEMENT;  Surgeon: Robert M Rourk, MD;  Location:  [facility];  Service: Endoscopy;;  . Balloon dilation  05/23/2012    Procedure: AMPULLARY BALLOON DILATION;  Surgeon: Robert M Rourk, MD;  Location: [facility];  Service: Endoscopy;;    Current Outpatient Prescriptions  Medication Sig Dispense Refill  . allopurinol (ZYLOPRIM) 100 MG tablet Take 100 mg by mouth daily.      . Calcium Carbonate-Vit D-Min (CALTRATE 600+D PLUS MINERALS) 600-800 MG-UNIT TABS Take 1 tablet by mouth 2 (two) times daily.      . felodipine (PLENDIL) 10 MG 24 hr tablet Take 10 mg by mouth daily.      . fentaNYL (DURAGESIC - DOSED MCG/HR) 100 MCG/HR Place 1 patch onto the skin every 3 (three) days.      . Ibuprofen-Diphenhydramine Cit (ADVIL PM) 200-38 MG TABS Take 1 tablet by mouth at bedtime as needed (sleep/pain).      . ondansetron (ZOFRAN) 4 MG tablet Take 1 tablet (4 mg total) by mouth every 6 (six) hours as needed for nausea.  20 tablet  0  . potassium chloride SA (K-DUR,KLOR-CON) 20 MEQ tablet Take 40 mEq by mouth 2 (two) times daily.      . torsemide (DEMADEX) 20 MG tablet Take 20 mg by mouth daily.      . traMADol-acetaminophen (ULTRACET) 37.5-325 MG per tablet Take 1 tablet by mouth 3 (three) times daily as needed for pain.       No current facility-administered medications for this visit.      Allergies as of 07/07/2012  . (No Known Allergies)    Family History  Problem Relation Age of Onset  . Stroke Father   . CAD Mother   . Cancer Son     ?metastatic    History   Social History  . Marital Status: Widowed    Spouse Name: N/A    Number of Children: 1  . Years of Education: N/A   Occupational History  . retired: RN [employer]    Social History Main Topics  . Smoking status: Former Smoker  . Smokeless tobacco: None  . Alcohol Use: No  . Drug Use: No  . Sexually Active: None   Other Topics Concern  . None   Social History Narrative   Lives alone   Gerdia Keesey, niece & caregiver, as well as POA          Review of Systems: Negative unless  mentioned in HPI.   Physical Exam: BP 159/77  Pulse 83  Temp(Src) 98.4 F (36.9 C) (Oral)  Ht 5' (1.524 m)  Wt 136 lb 12.8 oz (62.052 kg)  BMI 26.72 kg/m2 General:   Alert and oriented. No distress noted. Pleasant and cooperative.  Head:  Normocephalic and atraumatic. Eyes:  Conjuctiva clear without scleral icterus. Mouth:  Oral mucosa pink and moist.  Neck:  Supple, without mass or thyromegaly. Heart:  S1, S2 present without murmurs, rubs, or gallops.  Abdomen:  +BS, soft, significantly tender to palpation RUQ and non-distended. No rebound or guarding. No HSM or masses noted. Msk:  Symmetrical without gross deformities. Normal posture. Extremities:  Without edema. Neurologic:  Alert and  oriented x4;  grossly normal neurologically. Skin:  Intact without significant lesions or rashes. Cervical Nodes:  No significant cervical adenopathy. Psych:  Alert and cooperative. Normal mood and affect.  Lab Results  Component Value Date   ALT 10 06/18/2012   AST 18 06/18/2012   ALKPHOS 94 06/18/2012   BILITOT 0.5 06/18/2012     

## 2012-07-07 NOTE — Patient Instructions (Addendum)
We have scheduled you for a CT scan of your belly due to your continued pain and nausea.   Also, we have scheduled you for a repeat ERCP to remove/change the stent that you currently have.   We will be in touch regarding the results of the CT once they are available.

## 2012-07-08 ENCOUNTER — Other Ambulatory Visit: Payer: Self-pay | Admitting: Internal Medicine

## 2012-07-08 NOTE — Progress Notes (Signed)
CC PCP 

## 2012-07-08 NOTE — Assessment & Plan Note (Signed)
79 year old female with history of choledocholithiasis, s/p ERCP March 21 by Dr. Jena Gauss with sphincterotomy, stone extraction, and stent placement. Her admitting LFTs were significantly elevated, with normalization of LFTs noted as of 4/16. She continues to note persistent RUQ discomfort despite normalized LFTs. This was present in the hospital, and she was thought to have a musculoskeletal component. She is significantly tender on exam, and notes intermittent nausea and decreased po intake. Last abdominal imaging by CT was in 2012. Due to persistent pain and physical exam, I have ordered an updated CT abd/pelvis. If normal, proceed with ERCP with stent removal with Dr. Jena Gauss in near future. The risks and benefits were discussed with patient and her daughter at the time of visit.

## 2012-07-09 ENCOUNTER — Encounter (HOSPITAL_COMMUNITY): Payer: Self-pay | Admitting: Pharmacy Technician

## 2012-07-09 ENCOUNTER — Ambulatory Visit (HOSPITAL_COMMUNITY)
Admission: RE | Admit: 2012-07-09 | Discharge: 2012-07-09 | Disposition: A | Discharge status: Home / Self Care | Payer: Medicare Other | Source: Ambulatory Visit | Attending: Gastroenterology | Admitting: Gastroenterology

## 2012-07-09 DIAGNOSIS — K838 Other specified diseases of biliary tract: Secondary | ICD-10-CM | POA: Insufficient documentation

## 2012-07-09 DIAGNOSIS — R110 Nausea: Secondary | ICD-10-CM | POA: Insufficient documentation

## 2012-07-09 DIAGNOSIS — R1011 Right upper quadrant pain: Secondary | ICD-10-CM

## 2012-07-09 MED ORDER — IOHEXOL 300 MG/ML  SOLN
100.0000 mL | Freq: Once | INTRAMUSCULAR | Status: AC | PRN
  Administered 2012-07-09: 100 mL via INTRAVENOUS

## 2012-07-10 NOTE — Progress Notes (Signed)
Quick Note:  CT reviewed: Stent well-position, biliary dilation but stable.  Pancreas looks ok from CT perspective. I am reviewing with Dr. Jena Gauss. Recent LFTs normal. ______

## 2012-07-16 NOTE — Progress Notes (Signed)
Quick Note:  Tried to call pt- NA ______ 

## 2012-07-17 ENCOUNTER — Encounter (HOSPITAL_COMMUNITY): Payer: Self-pay

## 2012-07-17 ENCOUNTER — Encounter (HOSPITAL_COMMUNITY)
Admission: RE | Admit: 2012-07-17 | Discharge: 2012-07-17 | Disposition: A | Discharge status: Home / Self Care | Payer: Medicare Other | Source: Ambulatory Visit | Attending: Internal Medicine | Admitting: Internal Medicine

## 2012-07-17 LAB — BASIC METABOLIC PANEL
BUN: 15 mg/dL (ref 6–23)
Chloride: 98 mEq/L (ref 96–112)
Creatinine, Ser: 1.06 mg/dL (ref 0.50–1.10)
GFR calc Af Amer: 52 mL/min — ABNORMAL LOW (ref >90)
Glucose, Bld: 117 mg/dL — ABNORMAL HIGH (ref 70–99)
Potassium: 4.4 mEq/L (ref 3.5–5.1)

## 2012-07-17 NOTE — Patient Instructions (Addendum)
CARYSSA ELZEY  07/17/2012   Your procedure is scheduled on:  07/24/2012  Report to [facility] at  800  AM.  Call this number if you have problems the morning of surgery: [phone]   Remember:   Do not eat food or drink liquids after midnight.   Take these medicines the morning of surgery with A SIP OF WATER: allopurinol,zofran,ultracet   Do not wear jewelry, make-up or nail polish.  Do not wear lotions, powders, or perfumes.   Do not shave 48 hours prior to surgery. Men may shave face and neck.  Do not bring valuables to the hospital.  Contacts, dentures or bridgework may not be worn into surgery.  Leave suitcase in the car. After surgery it may be brought to your room.  For patients admitted to the hospital, checkout time is 11:00 AM the day of discharge.   Patients discharged the day of surgery will not be allowed to drive  home.  Name and phone number of your driver: family  Special Instructions: N/A   Please read over the following fact sheets that you were given: Pain Booklet, Coughing and Deep Breathing, Surgical Site Infection Prevention, Anesthesia Post-op Instructions and Care and Recovery After Surgery Endoscopic Retrograde Cholangiopancreatography This is a test used to evaluate people with jaundice (a condition in which the person's skin is turning yellow because of the high level of bilirubin in your blood). Bilirubin is a product of the blood which is elevated when an obstruction in the bile duct occurs. The bile ducts are the pipe-like system which carry the bile from the liver to the gallbladder and out into your small bowel. In this test, a fiber optic endoscope (a small pencil-sized telescope) is inserted and a catheter is put into ducts for examination. This test can reveal stones, strictures, cysts, tumors, and other irregularities within the pancreatic ducts and bile ducts. PREPARATION FOR TEST Do not eat or drink after midnight the day before the test.  NORMAL  FINDINGS Normal size of biliary and pancreatic ducts. No obstruction or filling defects within the biliary or pancreatic ducts. Ranges for normal findings may vary among different laboratories and hospitals. You should always check with your doctor after having lab work or other tests done to discuss the meaning of your test results and whether your values are considered within normal limits. MEANING OF TEST  Your caregiver will go over the test results with you and discuss the importance and meaning of your results, as well as treatment options and the need for additional tests if necessary. OBTAINING THE TEST RESULTS  It is your responsibility to obtain your test results. Ask the lab or department performing the test when and how you will get your results. Document Released: 06/15/2004 Document Revised: 11/01/2010 Document Reviewed: 01/30/2008 [facility] Patient Information 2012 [place], [place].PATIENT INSTRUCTIONS POST-ANESTHESIA  IMMEDIATELY FOLLOWING SURGERY:  Do not drive or operate machinery for the first twenty four hours after surgery.  Do not make any important decisions for twenty four hours after surgery or while taking narcotic pain medications or sedatives.  If you develop intractable nausea and vomiting or a severe headache please notify your doctor immediately.  FOLLOW-UP:  Please make an appointment with your surgeon as instructed. You do not need to follow up with anesthesia unless specifically instructed to do so.  WOUND CARE INSTRUCTIONS (if applicable):  Keep a dry clean dressing on the anesthesia/puncture wound site if there is drainage.  Once the  wound has quit draining you may leave it open to air.  Generally you should leave the bandage intact for twenty four hours unless there is drainage.  If the epidural site drains for more than 36-48 hours please call the anesthesia department.  QUESTIONS?:  Please feel free to call your physician or the hospital operator if you have any  questions, and they will be happy to assist you.

## 2012-07-17 NOTE — Progress Notes (Signed)
Quick Note:  I spoke with Dr. Jena Gauss regarding findings.  Need to continue with ERCP as planned.   ______

## 2012-07-24 ENCOUNTER — Encounter (HOSPITAL_COMMUNITY)
Admission: RE | Disposition: A | Discharge status: Home / Self Care | Payer: Self-pay | Source: Ambulatory Visit | Attending: Internal Medicine

## 2012-07-24 ENCOUNTER — Encounter (HOSPITAL_COMMUNITY): Payer: Self-pay | Admitting: Anesthesiology

## 2012-07-24 ENCOUNTER — Other Ambulatory Visit: Payer: Self-pay | Admitting: Internal Medicine

## 2012-07-24 ENCOUNTER — Encounter (HOSPITAL_COMMUNITY): Payer: Self-pay | Admitting: *Deleted

## 2012-07-24 ENCOUNTER — Ambulatory Visit (HOSPITAL_COMMUNITY)
Admission: RE | Admit: 2012-07-24 | Discharge: 2012-07-24 | Disposition: A | Discharge status: Home / Self Care | Payer: Medicare Other | Source: Ambulatory Visit | Attending: Internal Medicine | Admitting: Internal Medicine

## 2012-07-24 ENCOUNTER — Ambulatory Visit (HOSPITAL_COMMUNITY): Payer: Medicare Other

## 2012-07-24 ENCOUNTER — Other Ambulatory Visit: Payer: Self-pay

## 2012-07-24 ENCOUNTER — Ambulatory Visit (HOSPITAL_COMMUNITY): Payer: Medicare Other | Admitting: Anesthesiology

## 2012-07-24 DIAGNOSIS — K838 Other specified diseases of biliary tract: Secondary | ICD-10-CM | POA: Insufficient documentation

## 2012-07-24 DIAGNOSIS — R945 Abnormal results of liver function studies: Secondary | ICD-10-CM

## 2012-07-24 DIAGNOSIS — K8050 Calculus of bile duct without cholangitis or cholecystitis without obstruction: Secondary | ICD-10-CM | POA: Insufficient documentation

## 2012-07-24 DIAGNOSIS — Z01812 Encounter for preprocedural laboratory examination: Secondary | ICD-10-CM | POA: Insufficient documentation

## 2012-07-24 DIAGNOSIS — R1011 Right upper quadrant pain: Secondary | ICD-10-CM | POA: Insufficient documentation

## 2012-07-24 DIAGNOSIS — K5710 Diverticulosis of small intestine without perforation or abscess without bleeding: Secondary | ICD-10-CM | POA: Insufficient documentation

## 2012-07-24 HISTORY — PX: REMOVAL OF STONES: SHX5545

## 2012-07-24 HISTORY — PX: ERCP: SHX5425

## 2012-07-24 HISTORY — PX: SPYGLASS LITHOTRIPSY: SHX5537

## 2012-07-24 HISTORY — PX: BALLOON DILATION: SHX5330

## 2012-07-24 HISTORY — PX: SPYGLASS CHOLANGIOSCOPY: SHX5441

## 2012-07-24 HISTORY — PX: HOLMIUM LASER APPLICATION: SHX5852

## 2012-07-24 SURGERY — ERCP, WITH INTERVENTION IF INDICATED
Anesthesia: General

## 2012-07-24 MED ORDER — FENTANYL CITRATE 0.05 MG/ML IJ SOLN
INTRAMUSCULAR | Status: AC
  Filled 2012-07-24: qty 2

## 2012-07-24 MED ORDER — MIDAZOLAM HCL 2 MG/2ML IJ SOLN
INTRAMUSCULAR | Status: AC
  Filled 2012-07-24: qty 2

## 2012-07-24 MED ORDER — LACTATED RINGERS IV SOLN
INTRAVENOUS | Status: DC
  Administered 2012-07-24: 10:00:00 via INTRAVENOUS
  Administered 2012-07-24: 1000 mL via INTRAVENOUS

## 2012-07-24 MED ORDER — ACETYLCYSTEINE 20 % IN SOLN
RESPIRATORY_TRACT | Status: DC | PRN
  Administered 2012-07-24: 4 mL via ORAL

## 2012-07-24 MED ORDER — FENTANYL CITRATE 0.05 MG/ML IJ SOLN
INTRAMUSCULAR | Status: DC | PRN
  Administered 2012-07-24: 25 ug via INTRAVENOUS
  Administered 2012-07-24: 50 ug via INTRAVENOUS
  Administered 2012-07-24: 25 ug via INTRAVENOUS

## 2012-07-24 MED ORDER — ROCURONIUM BROMIDE 50 MG/5ML IV SOLN
INTRAVENOUS | Status: AC
  Filled 2012-07-24: qty 1

## 2012-07-24 MED ORDER — ONDANSETRON HCL 4 MG/2ML IJ SOLN
INTRAMUSCULAR | Status: AC
  Filled 2012-07-24: qty 2

## 2012-07-24 MED ORDER — MIDAZOLAM HCL 2 MG/2ML IJ SOLN
1.0000 mg | INTRAMUSCULAR | Status: DC | PRN
  Administered 2012-07-24: 2 mg via INTRAVENOUS

## 2012-07-24 MED ORDER — SODIUM CHLORIDE 0.9 % IV SOLN
INTRAVENOUS | Status: DC | PRN
  Administered 2012-07-24: 09:00:00

## 2012-07-24 MED ORDER — ACETYLCYSTEINE 20 % IN SOLN
RESPIRATORY_TRACT | Status: AC
  Filled 2012-07-24: qty 4

## 2012-07-24 MED ORDER — ROCURONIUM BROMIDE 100 MG/10ML IV SOLN
INTRAVENOUS | Status: DC | PRN
  Administered 2012-07-24: 15 mg via INTRAVENOUS

## 2012-07-24 MED ORDER — FENTANYL CITRATE 0.05 MG/ML IJ SOLN: 25.0000 ug | INTRAMUSCULAR | Status: DC | PRN

## 2012-07-24 MED ORDER — SUCCINYLCHOLINE CHLORIDE 20 MG/ML IJ SOLN
INTRAMUSCULAR | Status: AC
  Filled 2012-07-24: qty 1

## 2012-07-24 MED ORDER — GLYCOPYRROLATE 0.2 MG/ML IJ SOLN
INTRAMUSCULAR | Status: AC
  Filled 2012-07-24: qty 1

## 2012-07-24 MED ORDER — GLYCOPYRROLATE 0.2 MG/ML IJ SOLN
0.2000 mg | Freq: Once | INTRAMUSCULAR | Status: AC
  Administered 2012-07-24: 0.2 mg via INTRAVENOUS

## 2012-07-24 MED ORDER — STERILE WATER FOR IRRIGATION IR SOLN
Status: DC | PRN
  Administered 2012-07-24: 09:00:00

## 2012-07-24 MED ORDER — SUCCINYLCHOLINE CHLORIDE 20 MG/ML IJ SOLN
INTRAMUSCULAR | Status: DC | PRN
  Administered 2012-07-24: 80 mg via INTRAVENOUS

## 2012-07-24 MED ORDER — ONDANSETRON HCL 4 MG/2ML IJ SOLN: 4.0000 mg | Freq: Once | INTRAMUSCULAR | Status: DC | PRN

## 2012-07-24 MED ORDER — ONDANSETRON HCL 4 MG/2ML IJ SOLN
4.0000 mg | Freq: Once | INTRAMUSCULAR | Status: AC
  Administered 2012-07-24: 4 mg via INTRAVENOUS

## 2012-07-24 MED ORDER — PROPOFOL 10 MG/ML IV BOLUS
INTRAVENOUS | Status: DC | PRN
  Administered 2012-07-24: 100 mg via INTRAVENOUS

## 2012-07-24 SURGICAL SUPPLY — 43 items
BALLN DILATOR CRE 12-15 240 (BALLOONS) ×2
BALLN DILATOR CRE WIREGUIDE (BALLOONS) ×2
BALLN RETRIEVAL 12X15 (BALLOONS) ×1 IMPLANT
BALLOON DILATOR CRE 12-15 240 (BALLOONS) IMPLANT
BALLOON DILATOR CRE WIREGUIDE (BALLOONS) IMPLANT
BALN RTRVL 200 6-7FR 12-15 (BALLOONS) ×1
BASKET TRAPEZOID 3X6 (MISCELLANEOUS) ×1 IMPLANT
BASKET TRAPEZOID LITHO 2.5X5 (MISCELLANEOUS) IMPLANT
BLOCK BITE 60FR ADLT L/F BLUE (MISCELLANEOUS) ×1 IMPLANT
BSKT STON RTRVL TRAPEZOID 3X6 (MISCELLANEOUS) ×1
CATH ACCESS DELIVERY (CATHETERS) ×1 IMPLANT
DEVICE INFLATION ENCORE 26 (MISCELLANEOUS) IMPLANT
DEVICE LOCKING W-BIOPSY CAP (MISCELLANEOUS) ×2 IMPLANT
ELECT REM PT RETURN 9FT ADLT (ELECTROSURGICAL) ×2
ELECTRODE REM PT RTRN 9FT ADLT (ELECTROSURGICAL) IMPLANT
FIBER LASER SLIMLINE GI 365 (MISCELLANEOUS) ×1 IMPLANT
FLOOR PAD 36X40 (MISCELLANEOUS) ×2
FORCEPS BIOP SPYBITE 1.2X286 (FORCEP) IMPLANT
GUIDEWIRE HYDRA JAGWIRE .35 (WIRE) IMPLANT
GUIDEWIRE JAG HINI 025X260CM (WIRE) IMPLANT
NDL HYPO 18GX1.5 BLUNT FILL (NEEDLE) IMPLANT
NEEDLE HYPO 18GX1.5 BLUNT FILL (NEEDLE) ×2 IMPLANT
NS IRRIG 500ML POUR BTL (IV SOLUTION) ×1 IMPLANT
PAD ARMBOARD 7.5X6 YLW CONV (MISCELLANEOUS) ×2 IMPLANT
PAD FLOOR 36X40 (MISCELLANEOUS) IMPLANT
PROBE VISUALIZATION SPYGLASS (PROBE) ×1 IMPLANT
SNARE ROTATE MED OVAL 20MM (MISCELLANEOUS) ×1 IMPLANT
SNARE SHORT THROW 27M MED OVAL (MISCELLANEOUS) IMPLANT
SPHINCTEROTOME AUTOTOME .25 (MISCELLANEOUS) IMPLANT
SPHINCTEROTOME HYDRATOME 44 (MISCELLANEOUS) ×3 IMPLANT
SPONGE GAUZE 4X4 12PLY (GAUZE/BANDAGES/DRESSINGS) ×1 IMPLANT
SYR 20CC LL (SYRINGE) IMPLANT
SYR 3ML LL SCALE MARK (SYRINGE) IMPLANT
SYR 50ML LL SCALE MARK (SYRINGE) ×2 IMPLANT
SYR 5ML LL (SYRINGE) ×1 IMPLANT
SYR INFLATION 60ML (SYRINGE) ×1 IMPLANT
SYSTEM CONTINUOUS INJECTION (MISCELLANEOUS) ×2 IMPLANT
TUBE IRRIGATION (IRRIGATION / IRRIGATOR) ×1 IMPLANT
TUBING ENDO SMARTCAP PENTAX (MISCELLANEOUS) ×1 IMPLANT
TUBING IRRIGATION ENDOGATOR (MISCELLANEOUS) ×1 IMPLANT
WALLSTENT METAL COVERED 10X60 (STENTS) IMPLANT
WALLSTENT METAL COVERED 10X80 (STENTS) IMPLANT
WATER STERILE IRR 1000ML POUR (IV SOLUTION) ×2 IMPLANT

## 2012-07-24 NOTE — Op Note (Signed)
NAME:  Lori Bishop, FAXON                ACCOUNT NO.:  [id number]  MEDICAL RECORD NO.:  [id number]  LOCATION:  [facility]                          FACILITY:  [facility]  PHYSICIAN:  R. Roetta Sessions, MD FACP FACGDATE OF BIRTH:  September 15, 1920  DATE OF PROCEDURE:  07/24/2012 DATE OF DISCHARGE:                              OPERATIVE REPORT   OPERATION:  ERCP with sphincterotomy, balloon dilation, balloon and basket stone extraction, laser lithotripsy of multiple common duct stones, and cholangioscopy.  INDICATIONS FOR PROCEDURE:  An 79 year old lady presented a couple of months ago with symptomatic choledocholithiasis.  She underwent an ERCP with stone extraction, ampullary/sphincterotomy balloon dilation and stent placement.  She has done well.  She has had some intermittent right upper quadrant abdominal pain.  A recent CT demonstrated no other pathology.  LFTs completely normal.  She comes now for stent removal and removal/lithotripsy of any remaining stones.  Risks, benefits, limitations, alternatives, imponderables have been discussed at length with the patient, patient's daughter today preoperatively and in the office previously.  Questions been answered.  Please see the H and P and the updated H and P for more information.  PROCEDURE NOTE:  General endotracheal anesthesia was induced by Dr. Jayme Cloud and associates.  The patient has been placed in semiprone position.  INSTRUMENTATION:  Pentax video chip system.  FINDINGS:  Cursory examination of the distal esophagus, stomach and duodenum revealed again a large juxta-ampullary duodenal diverticulum with a plastic previously placed biliary stent protruding through the ampullary orifice.  Both the end and side ports appeared to be occluded. Scope was pulled back to the short position 55 cm from the incisors. Scout film was taken.  Subsequently utilizing a snare through the scope, the stent was grasped and removed intact.  Subsequently, the scope  was reintroduced into the duodenum.  Using the [facility] sphincter tone, the sphincterotomy was approached.  Using guidewire, palpation of the deep biliary cannulation was easily achieved.  Cholangiogram revealed marked dilation and ectasia of the biliary tree with multiple irregular filling defects.  I railed off the sphincter tone and placed a graduated CRE balloon across the sphincterotomy.  I inflated it to 12 mm for 1 minute and took it down.  This was done without difficulty or apparent complications.  Subsequently, I took the extractor balloon and railed it to the confluence and performed a balloon occlusion cholangiogram three times with recovery of multiple small pigment and mixed pigment cholesterol stones.  Also took the 4 wire basket and advanced it into the biliary tree under fluoroscopic control and retrieved a few more stone fragments.  Subsequently, I elected to perform cholangioscopy as I suspected persistent common duct stones.  With the assistance of the [employer] rep, I obtained the next size of graduated CRE balloon, placed across the ampullary orifice and again inflated 12 mm x1 minute and then took it up to 13.5 mm x1 minute and then took it down.  This was done without difficulty or apparent complication.  Subsequently, the SpyGlass apparatus was set up and I advanced a SpyGlass into the common bile duct where multiple irregular stone fragments with couple of large piston-shaped stones were seen.  I  did not see a stricture or tumor.  Utilizing the holmium laser at 10 joules each multiple applications applied to break up the largest stones, this was done without difficulty or apparent complication. Subsequently, I removed the SpyGlass equipment and again advanced extractor balloon to the confluence and inflated it to accommodate the size of the bile duct and multiple sweeps were made with recovery of a large amount of small stone fragments.  Biliary tree  appeared to drain very well after these maneuvers.  I felt that the biliary system had been adequately decompressed into the ampullary orifice, was large enough for any remaining stone fragments to pass as none of that.  The patient tolerated the procedure well and was taken to the PACU in stable condition.  Final film review pending with the radiologist.  No pancreatic duct injection/manipulation performed today.  IMPRESSION: 1. Juxta-ampullary duodenal diverticulum. 2. Status post balloon dilation of the previously performed     sphincterotomy site as described above status post balloon and     basket stone extraction, status post cholangioscopy with holmium     lithotripsy of common duct stones.  RECOMMENDATIONS: 1. Observe in PACU this afternoon. 2. If she is doing well, I will allow her to go home on a clear liquid     diet remainder of day.  She is to advance to a heart healthy diet     tomorrow as tolerated. 3. Specific instructions provided should she have any difficulties,     she is to call in.  We will plan to obtain a hepatic function     profile in 1 week. 4. Hopefully, she will not need any further biliary intervention in     the future.     Jonathon Bellows, MD FACP [facility]     RMR/MEDQ  D:  07/24/2012  T:  07/24/2012  Job:  [id number]  cc:   Ninetta Lights D. Felecia Shelling, MD Fax: [phone]

## 2012-07-24 NOTE — H&P (View-Only) (Signed)
Referring Provider: Avon Gully, MD Primary Care Physician:  Avon Gully, MD Primary GI: Dr. Jena Gauss   Chief Complaint  Patient presents with  . Follow-up    HPI:   79 year old female who returns today to set up a repeat ERCP with stent change/removal. She was hospitalized at [facility] in March 2014 secondary to choledocholithiasis, s/p ERCP March 21 by Dr. Jena Gauss with sphincterotomy, stone extraction, and stent placement. Her admitting LFTs were significantly elevated, with normalization of LFTs noted as of 4/16.  Notes RUQ pain, right under her ribs, wraps around to back. Intermittent but does not completely leave. Always present, about a 2-3 on pain scale, worsens to a 7-8 at times. Depends on what she eats. Leafy veggies, cabbage worsens. Fried foods worsen. Denies any lack of appetite, weight loss. Decreased oral intake. Nausea intermittent. States this pain has been going on for a few years. No constipation, takes Miralax. RUQ pain not related to bowel habits. It was thought this may be secondary to musculoskeletal etiology while inpatient.    Past Medical History  Diagnosis Date  . Gout   . Osteoarthritis   . Leg cramps     Past Surgical History  Procedure Laterality Date  . Cholecystectomy    . Appendectomy    . Cataract extraction Bilateral   . Wrist arthroplasty Right   . Right femur      metal plate  . Breast biopsy Left   . Ercp  05/23/2012    Procedure: ENDOSCOPIC RETROGRADE CHOLANGIOPANCREATOGRAPHY (ERCP);  Surgeon: Corbin Ade, MD;  Location: [facility];  Service: Endoscopy;;  . Removal of stones  05/23/2012    Procedure: BALLOON AND BASKET REMOVAL OF STONES;  Surgeon: Corbin Ade, MD;  Location: [facility];  Service: Endoscopy;;  . Sphincterotomy  05/23/2012    Procedure: Dennison Mascot;  Surgeon: Corbin Ade, MD;  Location: [facility];  Service: Endoscopy;;  . Biliary stent placement  05/23/2012    Procedure: BILIARY STENT PLACEMENT;  Surgeon: Corbin Ade, MD;  Location:  [facility];  Service: Endoscopy;;  . Balloon dilation  05/23/2012    Procedure: AMPULLARY BALLOON DILATION;  Surgeon: Corbin Ade, MD;  Location: [facility];  Service: Endoscopy;;    Current Outpatient Prescriptions  Medication Sig Dispense Refill  . allopurinol (ZYLOPRIM) 100 MG tablet Take 100 mg by mouth daily.      . Calcium Carbonate-Vit D-Min (CALTRATE 600+D PLUS MINERALS) 600-800 MG-UNIT TABS Take 1 tablet by mouth 2 (two) times daily.      . felodipine (PLENDIL) 10 MG 24 hr tablet Take 10 mg by mouth daily.      . fentaNYL (DURAGESIC - DOSED MCG/HR) 100 MCG/HR Place 1 patch onto the skin every 3 (three) days.      . Ibuprofen-Diphenhydramine Cit (ADVIL PM) 200-38 MG TABS Take 1 tablet by mouth at bedtime as needed (sleep/pain).      . ondansetron (ZOFRAN) 4 MG tablet Take 1 tablet (4 mg total) by mouth every 6 (six) hours as needed for nausea.  20 tablet  0  . potassium chloride SA (K-DUR,KLOR-CON) 20 MEQ tablet Take 40 mEq by mouth 2 (two) times daily.      Marland Kitchen torsemide (DEMADEX) 20 MG tablet Take 20 mg by mouth daily.      . traMADol-acetaminophen (ULTRACET) 37.5-325 MG per tablet Take 1 tablet by mouth 3 (three) times daily as needed for pain.       No current facility-administered medications for this visit.  Allergies as of 07/07/2012  . (No Known Allergies)    Family History  Problem Relation Age of Onset  . Stroke Father   . CAD Mother   . Cancer Son     ?metastatic    History   Social History  . Marital Status: Widowed    Spouse Name: N/A    Number of Children: 1  . Years of Education: N/A   Occupational History  . 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 Smoking status: Former Games developer  . Smokeless tobacco: None  . Alcohol Use: No  . Drug Use: No  . Sexually Active: None   Other Topics Concern  . None   Social History Narrative   Lives alone   Candiss Norse, niece & caregiver, as well as POA          Review of Systems: Negative unless  mentioned in HPI.   Physical Exam: BP 159/77  Pulse 83  Temp(Src) 98.4 F (36.9 C) (Oral)  Ht 5' (1.524 m)  Wt 136 lb 12.8 oz (62.052 kg)  BMI 26.72 kg/m2 General:   Alert and oriented. No distress noted. Pleasant and cooperative.  Head:  Normocephalic and atraumatic. Eyes:  Conjuctiva clear without scleral icterus. Mouth:  Oral mucosa pink and moist.  Neck:  Supple, without mass or thyromegaly. Heart:  S1, S2 present without murmurs, rubs, or gallops.  Abdomen:  +BS, soft, significantly tender to palpation RUQ and non-distended. No rebound or guarding. No HSM or masses noted. Msk:  Symmetrical without gross deformities. Normal posture. Extremities:  Without edema. Neurologic:  Alert and  oriented x4;  grossly normal neurologically. Skin:  Intact without significant lesions or rashes. Cervical Nodes:  No significant cervical adenopathy. Psych:  Alert and cooperative. Normal mood and affect.  Lab Results  Component Value Date   ALT 10 06/18/2012   AST 18 06/18/2012   ALKPHOS 94 06/18/2012   BILITOT 0.5 06/18/2012

## 2012-07-24 NOTE — Transfer of Care (Signed)
Immediate Anesthesia Transfer of Care Note  Patient: Lori Bishop  Procedure(s) Performed: Procedure(s) with comments: ENDOSCOPIC RETROGRADE CHOLANGIOPANCREATOGRAPHY (ERCP) (N/A) - Balloon basket dredge and balloon dialate sphincterotomy STENT REMOVAL (N/A) SPYGLASS CHOLANGIOSCOPY (N/A) SPYGLASS LITHOTRIPSY (N/A) REMOVAL OF STONES (N/A) BALLOON DILATION; Balloon basket dredge and balloon dialate sphincterotomy (N/A)  Patient Location: PACU  Anesthesia Type:General  Level of Consciousness: awake and patient cooperative  Airway & Oxygen Therapy: Patient Spontanous Breathing and Patient connected to face mask oxygen  Post-op Assessment: Report given to PACU RN, Post -op Vital signs reviewed and stable and Patient moving all extremities  Post vital signs: Reviewed and stable  Complications: No apparent anesthesia complications

## 2012-07-24 NOTE — Anesthesia Preprocedure Evaluation (Signed)
Anesthesia Evaluation  Patient identified by MRN, date of birth, ID band Patient awake    Reviewed: Allergy & Precautions, H&P , NPO status , Patient's Chart, lab work & pertinent test results  History of Anesthesia Complications Negative for: history of anesthetic complications  Airway Mallampati: II TM Distance: >3 FB Neck ROM: Full    Dental  (+) Edentulous Upper and Edentulous Lower   Pulmonary former smoker,    Pulmonary exam normal       Cardiovascular Exercise Tolerance: Poor hypertension, Pt. on medications Rate:Normal     Neuro/Psych Carpal tunnel numbness right hand; post plating right femur  Neuromuscular disease    GI/Hepatic Enzymes elevated   Endo/Other  Gout   Renal/GU      Musculoskeletal  (+) Arthritis -, Osteoarthritis,    Abdominal Normal abdominal exam  (+)   Peds  Hematology   Anesthesia Other Findings   Reproductive/Obstetrics                           Anesthesia Physical Anesthesia Plan  ASA: II  Anesthesia Plan: General   Post-op Pain Management:    Induction: Intravenous  Airway Management Planned: Oral ETT  Additional Equipment:   Intra-op Plan:   Post-operative Plan: Extubation in OR  Informed Consent: I have reviewed the patients History and Physical, chart, labs and discussed the procedure including the risks, benefits and alternatives for the proposed anesthesia with the patient or authorized representative who has indicated his/her understanding and acceptance.     Plan Discussed with: Anesthesiologist  Anesthesia Plan Comments:         Anesthesia Quick Evaluation

## 2012-07-24 NOTE — Anesthesia Postprocedure Evaluation (Signed)
  Anesthesia Post-op Note  Patient: Lori Bishop  Procedure(s) Performed: Procedure(s) with comments: ENDOSCOPIC RETROGRADE CHOLANGIOPANCREATOGRAPHY (ERCP) (N/A) - Balloon basket dredge and balloon dialate sphincterotomy STENT REMOVAL (N/A) SPYGLASS CHOLANGIOSCOPY (N/A) SPYGLASS LITHOTRIPSY (N/A) REMOVAL OF STONES (N/A) BALLOON DILATION; Balloon basket dredge and balloon dialate sphincterotomy (N/A) HOLMIUM LASER APPLICATION (N/A)  Patient Location: PACU  Anesthesia Type:General  Level of Consciousness: awake, alert , oriented and patient cooperative  Airway and Oxygen Therapy: Patient Spontanous Breathing  Post-op Pain: none  Post-op Assessment: Post-op Vital signs reviewed, Patient's Cardiovascular Status Stable, Respiratory Function Stable, Patent Airway, No signs of Nausea or vomiting and Pain level controlled  Post-op Vital Signs: Reviewed and stable  Complications: No apparent anesthesia complications

## 2012-07-24 NOTE — Anesthesia Procedure Notes (Signed)
Procedure Name: Intubation Date/Time: 07/24/2012 8:53 AM Performed by: Glynn Octave E Pre-anesthesia Checklist: Patient identified, Patient being monitored, Timeout performed, Emergency Drugs available and Suction available Patient Re-evaluated:Patient Re-evaluated prior to inductionOxygen Delivery Method: Circle System Utilized Preoxygenation: Pre-oxygenation with 100% oxygen Intubation Type: IV induction Ventilation: Mask ventilation without difficulty Laryngoscope Size: Mac and 3 Grade View: Grade I Tube type: Oral Tube size: 7.0 mm Number of attempts: 1 Airway Equipment and Method: stylet Placement Confirmation: ETT inserted through vocal cords under direct vision,  positive ETCO2 and breath sounds checked- equal and bilateral Secured at: 21 cm Tube secured with: Tape Dental Injury: Teeth and Oropharynx as per pre-operative assessment

## 2012-07-24 NOTE — Interval H&P Note (Signed)
History and Physical Interval Note:  07/24/2012 8:27 AM  Lori Bishop  has presented today for surgery, with the diagnosis of RUQ PAIN  The various methods of treatment have been discussed with the patient and family. After consideration of risks, benefits and other options for treatment, the patient has consented to  Procedure(s) with comments: ENDOSCOPIC RETROGRADE CHOLANGIOPANCREATOGRAPHY (ERCP) (N/A) - 9:30 STENT REMOVAL (N/A) SPYGLASS CHOLANGIOSCOPY (N/A) SPYGLASS LITHOTRIPSY (N/A) as a surgical intervention .  The patient's history has been reviewed, patient examined, no change in status, stable for surgery.  I have reviewed the patient's chart and labs.  Questions were answered to the patient's satisfaction.     [name]  Patient here for ERCP with stent removal. Recent CT demonstrated stent to be in good position - some persisting biliary dilation but LFTs well within normal limits. Plan for stent removal /  cholangiogram plus minus additional sphincterotomy dilation, cholangioscopy. Lithotripsy only if necessary.The risks, benefits, limitations, alternatives, and mponderable have been reviewed with the patient. I specifically discussed a1 in 10 chance of pancreatitis, reaction to medications, bleeding, perforation and the possibility of a failed ERCP. Potential for sphincterotomy and stent placement also reviewed. Questions have been answered. All parties agreeable.  Daughter present for discussion. All questions answered. All parties agreeable. Anticipate hospital discharge following the procedure as long as everything goes  per plan.

## 2012-07-25 ENCOUNTER — Encounter (HOSPITAL_COMMUNITY): Payer: Self-pay | Admitting: Internal Medicine

## 2012-07-25 ENCOUNTER — Other Ambulatory Visit: Payer: Self-pay | Admitting: Internal Medicine

## 2012-07-31 LAB — HEPATIC FUNCTION PANEL
ALT: 17 U/L (ref 0–35)
AST: 24 U/L (ref 0–37)
Albumin: 4.2 g/dL (ref 3.5–5.2)
Alkaline Phosphatase: 86 U/L (ref 39–117)
Bilirubin, Direct: 0.1 mg/dL (ref 0.0–0.3)
Indirect Bilirubin: 0.3 mg/dL (ref 0.0–0.9)
Total Bilirubin: 0.4 mg/dL (ref 0.3–1.2)
Total Protein: 7.1 g/dL (ref 6.0–8.3)

## 2012-08-26 ENCOUNTER — Encounter: Payer: Self-pay | Admitting: Internal Medicine

## 2012-08-27 ENCOUNTER — Ambulatory Visit: Payer: Medicare Other | Admitting: Gastroenterology

## 2012-08-27 ENCOUNTER — Ambulatory Visit (INDEPENDENT_AMBULATORY_CARE_PROVIDER_SITE_OTHER): Payer: Medicare Other | Admitting: Gastroenterology

## 2012-08-27 ENCOUNTER — Encounter: Payer: Self-pay | Admitting: Gastroenterology

## 2012-08-27 VITALS — BP 133/66 | HR 82 | Temp 97.9°F | Ht 60.0 in | Wt 132.2 lb

## 2012-08-27 DIAGNOSIS — R1011 Right upper quadrant pain: Secondary | ICD-10-CM

## 2012-08-27 DIAGNOSIS — K5900 Constipation, unspecified: Secondary | ICD-10-CM

## 2012-08-27 MED ORDER — LINACLOTIDE 145 MCG PO CAPS: 145.0000 ug | ORAL_CAPSULE | Freq: Every day | ORAL | Status: DC

## 2012-08-27 NOTE — Progress Notes (Signed)
Referring Provider: Avon Gully, MD Primary Care Physician:  Avon Gully, MD Primary GI: Dr. Jena Gauss   Chief Complaint  Patient presents with  . Follow-up    HPI:   Lori Bishop presents today in follow-up after repeat ERCP for stent removal with removal/lithotripsy of remaining stones. Prior to this second ERCP, she underwent a CT scan due to persistent RUQ pain. This was essentially benign.  Takes Miralax prn. Occasional nausea. Usually Miralax every other night. Stool is pasty. Occasional RUQ discomfort. Not related to eating. Most times better after BM. Nausea sometimes in the morning. Last colonoscopy in remote past. Dr. Katrinka Blazing. Takes Tums every once in awhile. Denies early satiety. Appetite good. Hard stool. No rectal bleeding.   Past Medical History  Diagnosis Date  . Gout   . Osteoarthritis   . Leg cramps     Past Surgical History  Procedure Laterality Date  . Cholecystectomy    . Appendectomy    . Cataract extraction Bilateral   . Wrist arthroplasty Right   . Right femur      metal plate  . Breast biopsy Left   . Ercp  05/23/2012    RMR: juxta-ampullary duodenal diverticulum, markedly dilated biliary tree with choledocholithiasis, s/p sphincterotomy with biliary sphincterotomy balloon dilation, balloon and basket stone extraction, stent placement  . Removal of stones  05/23/2012    Procedure: BALLOON AND BASKET REMOVAL OF STONES;  Surgeon: Corbin Ade, MD;  Location: [facility];  Service: Endoscopy;;  . Sphincterotomy  05/23/2012    Procedure: Dennison Mascot;  Surgeon: Corbin Ade, MD;  Location: [facility];  Service: Endoscopy;;  . Biliary stent placement  05/23/2012    Procedure: BILIARY STENT PLACEMENT;  Surgeon: Corbin Ade, MD;  Location: [facility];  Service: Endoscopy;;  . Balloon dilation  05/23/2012    Procedure: AMPULLARY BALLOON DILATION;  Surgeon: Corbin Ade, MD;  Location: [facility];  Service: Endoscopy;;  . Ercp N/A 07/24/2012    [id number]  duodenal diverticulum/s/p dilation sphincterotomy site s/p basket stone extraction s/p cholangioscopy with holmium lithotripsy of common duct stones  . Spyglass cholangioscopy N/A 07/24/2012    Procedure: XLKGMWNU CHOLANGIOSCOPY;  Surgeon: Corbin Ade, MD;  Location: [facility];  Service: Endoscopy;  Laterality: N/A;  . Spyglass lithotripsy N/A 07/24/2012    Procedure: UVOZDGUY LITHOTRIPSY;  Surgeon: Corbin Ade, MD;  Location: [facility];  Service: Endoscopy;  Laterality: N/A;  . Removal of stones N/A 07/24/2012    Procedure: REMOVAL OF STONES;  Surgeon: Corbin Ade, MD;  Location: [facility];  Service: Endoscopy;  Laterality: N/A;  . Balloon dilation N/A 07/24/2012    Procedure: BALLOON DILATION; Balloon basket dredge and balloon dialate sphincterotomy;  Surgeon: Corbin Ade, MD;  Location: [facility];  Service: Endoscopy;  Laterality: N/A;  . Holmium laser application N/A 07/24/2012    Procedure: HOLMIUM LASER APPLICATION;  Surgeon: Corbin Ade, MD;  Location: [facility];  Service: Endoscopy;  Laterality: N/A;    Current Outpatient Prescriptions  Medication Sig Dispense Refill  . allopurinol (ZYLOPRIM) 100 MG tablet Take 100 mg by mouth daily.      . Calcium Carbonate-Vit D-Min (CALTRATE 600+D PLUS MINERALS) 600-800 MG-UNIT TABS Take 1 tablet by mouth 2 (two) times daily.      . felodipine (PLENDIL) 10 MG 24 hr tablet Take 10 mg by mouth daily.      . fentaNYL (DURAGESIC - DOSED MCG/HR) 100 MCG/HR Place 1 patch onto the skin every 3 (  three) days.      . Ibuprofen-Diphenhydramine Cit (ADVIL PM) 200-38 MG TABS Take 1 tablet by mouth at bedtime as needed (sleep/pain).      . ondansetron (ZOFRAN) 4 MG tablet TAKE ONE TABLET BY MOUTH EVERY 6 HOURS AS NEEDED FOR NAUSEA.  20 tablet  0  . potassium chloride SA (K-DUR,KLOR-CON) 20 MEQ tablet Take 40 mEq by mouth 2 (two) times daily.      Marland Kitchen torsemide (DEMADEX) 20 MG tablet Take 20 mg by mouth daily.      . traMADol-acetaminophen (ULTRACET) 37.5-325 MG per  tablet Take 1 tablet by mouth 3 (three) times daily as needed for pain.      . Linaclotide (LINZESS) 145 MCG CAPS Take 1 capsule (145 mcg total) by mouth daily. 30 minutes before breakfast  30 capsule  3   No current facility-administered medications for this visit.    Allergies as of 08/27/2012  . (No Known Allergies)    Family History  Problem Relation Age of Onset  . Stroke Father   . CAD Mother   . Cancer Son     ?metastatic    History   Social History  . Marital Status: Widowed    Spouse Name: N/A    Number of Children: 1  . Years of Education: N/A   Occupational History  . 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 Smoking status: Former Smoker -- 0.25 packs/day for 15 years    Types: Cigarettes    Quit date: 07/17/1988  . Smokeless tobacco: None  . Alcohol Use: No  . Drug Use: No  . Sexually Active: Yes    Birth Control/ Protection: Post-menopausal   Other Topics Concern  . None   Social History Narrative   Lives alone   Candiss Norse, niece & caregiver, as well as POA          Review of Systems: Negative unless mentioned in HPI  Physical Exam: BP 133/66  Pulse 82  Temp(Src) 97.9 F (36.6 C) (Oral)  Ht 5' (1.524 m)  Wt 132 lb 3.2 oz (59.966 kg)  BMI 25.82 kg/m2 General:   Alert and oriented. No distress noted. Pleasant and cooperative.  Head:  Normocephalic and atraumatic. Eyes:  Conjuctiva clear without scleral icterus. Mouth:  Oral mucosa pink and moist. Good dentition. No lesions. Heart:  S1, S2 present without murmurs, rubs, or gallops. Regular rate and rhythm. Abdomen:  +BS, soft, non-tender and non-distended. No rebound or guarding. No HSM or masses noted. Msk:  Symmetrical without gross deformities. Normal posture. Extremities:  Without edema. Neurologic:  Alert and  oriented x4;  grossly normal neurologically. Skin:  Intact without significant lesions or rashes. Psych:  Alert and cooperative. Normal mood and affect.

## 2012-08-27 NOTE — Patient Instructions (Addendum)
Start taking Linzess each morning, 30 minutes before breakfast. This is in place of Miralax. This is for constipation. Monitor for loose stools.  We will see you back in 6 weeks.

## 2012-08-29 NOTE — Assessment & Plan Note (Signed)
Linzess daily.  

## 2012-08-29 NOTE — Assessment & Plan Note (Signed)
79 y.o. female with vague RUQ pain, with history of recent repeat ERCP for stent removal and stone extraction. Original ERCP performed due to choledocholithiasis. At this point, appears mild abdominal discomfort is related to bowel habits. Will trial Linzess due to chronic constipation at 145 mcg daily. Return in 6 weeks.

## 2012-09-01 NOTE — Progress Notes (Signed)
Cc PCP 

## 2012-10-13 ENCOUNTER — Ambulatory Visit: Payer: Medicare Other | Admitting: Gastroenterology

## 2012-10-20 ENCOUNTER — Ambulatory Visit (INDEPENDENT_AMBULATORY_CARE_PROVIDER_SITE_OTHER): Payer: Medicare Other | Admitting: Gastroenterology

## 2012-10-20 ENCOUNTER — Encounter: Payer: Self-pay | Admitting: Gastroenterology

## 2012-10-20 VITALS — BP 130/67 | HR 67 | Temp 98.2°F | Ht 60.0 in | Wt 131.2 lb

## 2012-10-20 DIAGNOSIS — K5900 Constipation, unspecified: Secondary | ICD-10-CM

## 2012-10-20 NOTE — Progress Notes (Signed)
Referring Provider: Avon Gully, MD Primary Care Physician:  Avon Gully, MD Primary GI: Dr. Jena Gauss   Chief Complaint  Patient presents with  . Follow-up    HPI:   Lori Bishop presents today for a 6 week follow-up due to constipation. Started on Linzess 145 mcg in June 2014. Using Miralax instead of Linzess. States every once in a while feels a pin-stick in RUQ. Usually associated with bowel movements. Pain overall improved. Did not tolerate Linzess. No N/V. Appetite good. No other complaints currently.   Past Medical History  Diagnosis Date  . Gout   . Osteoarthritis   . Leg cramps     Past Surgical History  Procedure Laterality Date  . Cholecystectomy    . Appendectomy    . Cataract extraction Bilateral   . Wrist arthroplasty Right   . Right femur      metal plate  . Breast biopsy Left   . Ercp  05/23/2012    RMR: juxta-ampullary duodenal diverticulum, markedly dilated biliary tree with choledocholithiasis, s/p sphincterotomy with biliary sphincterotomy balloon dilation, balloon and basket stone extraction, stent placement  . Removal of stones  05/23/2012    Procedure: BALLOON AND BASKET REMOVAL OF STONES;  Surgeon: Corbin Ade, MD;  Location: [facility];  Service: Endoscopy;;  . Sphincterotomy  05/23/2012    Procedure: Dennison Mascot;  Surgeon: Corbin Ade, MD;  Location: [facility];  Service: Endoscopy;;  . Biliary stent placement  05/23/2012    Procedure: BILIARY STENT PLACEMENT;  Surgeon: Corbin Ade, MD;  Location: [facility];  Service: Endoscopy;;  . Balloon dilation  05/23/2012    Procedure: AMPULLARY BALLOON DILATION;  Surgeon: Corbin Ade, MD;  Location: [facility];  Service: Endoscopy;;  . Ercp N/A 07/24/2012    [id number] duodenal diverticulum/s/p dilation sphincterotomy site s/p basket stone extraction s/p cholangioscopy with holmium lithotripsy of common duct stones  . Spyglass cholangioscopy N/A 07/24/2012    Procedure: YNWGNFAO CHOLANGIOSCOPY;  Surgeon:  Corbin Ade, MD;  Location: [facility];  Service: Endoscopy;  Laterality: N/A;  . Spyglass lithotripsy N/A 07/24/2012    Procedure: ZHYQMVHQ LITHOTRIPSY;  Surgeon: Corbin Ade, MD;  Location: [facility];  Service: Endoscopy;  Laterality: N/A;  . Removal of stones N/A 07/24/2012    Procedure: REMOVAL OF STONES;  Surgeon: Corbin Ade, MD;  Location: [facility];  Service: Endoscopy;  Laterality: N/A;  . Balloon dilation N/A 07/24/2012    Procedure: BALLOON DILATION; Balloon basket dredge and balloon dialate sphincterotomy;  Surgeon: Corbin Ade, MD;  Location: [facility];  Service: Endoscopy;  Laterality: N/A;  . Holmium laser application N/A 07/24/2012    Procedure: HOLMIUM LASER APPLICATION;  Surgeon: Corbin Ade, MD;  Location: [facility];  Service: Endoscopy;  Laterality: N/A;    Current Outpatient Prescriptions  Medication Sig Dispense Refill  . allopurinol (ZYLOPRIM) 100 MG tablet Take 100 mg by mouth daily.      . Calcium Carbonate-Vit D-Min (CALTRATE 600+D PLUS MINERALS) 600-800 MG-UNIT TABS Take 1 tablet by mouth 2 (two) times daily.      . felodipine (PLENDIL) 10 MG 24 hr tablet Take 10 mg by mouth daily.      . fentaNYL (DURAGESIC - DOSED MCG/HR) 100 MCG/HR Place 1 patch onto the skin every 3 (three) days.      . Ibuprofen-Diphenhydramine Cit (ADVIL PM) 200-38 MG TABS Take 1 tablet by mouth at bedtime as needed (sleep/pain).      . ondansetron (ZOFRAN) 4 MG  tablet TAKE ONE TABLET BY MOUTH EVERY 6 HOURS AS NEEDED FOR NAUSEA.  20 tablet  0  . potassium chloride SA (K-DUR,KLOR-CON) 20 MEQ tablet Take 40 mEq by mouth 2 (two) times daily.      Marland Kitchen torsemide (DEMADEX) 20 MG tablet Take 20 mg by mouth daily.      . traMADol-acetaminophen (ULTRACET) 37.5-325 MG per tablet Take 1 tablet by mouth 3 (three) times daily as needed for pain.       No current facility-administered medications for this visit.    Allergies as of 10/20/2012  . (No Known Allergies)    Family History  Problem Relation Age of  Onset  . Stroke Father   . CAD Mother   . Cancer Son     ?metastatic    History   Social History  . Marital Status: Widowed    Spouse Name: N/A    Number of Children: 1  . Years of Education: N/A   Occupational History  . 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 Smoking status: Former Smoker -- 0.25 packs/day for 15 years    Types: Cigarettes    Quit date: 07/17/1988  . Smokeless tobacco: None  . Alcohol Use: No  . Drug Use: No  . Sexual Activity: Yes    Birth Control/ Protection: Post-menopausal   Other Topics Concern  . None   Social History Narrative   Lives alone   Lori Bishop, niece & caregiver, as well as POA          Review of Systems: Negative unless mentioned in HPI.   Physical Exam: BP 130/67  Pulse 67  Temp(Src) 98.2 F (36.8 C) (Oral)  Ht 5' (1.524 m)  Wt 131 lb 3.2 oz (59.512 kg)  BMI 25.62 kg/m2 General:   Alert and oriented. No distress noted. Pleasant and cooperative.  Head:  Normocephalic and atraumatic. Eyes:  Conjuctiva clear without scleral icterus. Heart:  S1, S2 present without murmurs, rubs, or gallops. Regular rate and rhythm. Abdomen:  +BS, soft, non-tender and non-distended. No rebound or guarding. No HSM or masses noted. Msk:  Symmetrical without gross deformities. Normal posture. Extremities:  Trace ankle/pedal edema, chronic Neurologic:  Alert and  oriented x4;  grossly normal neurologically. Skin:  Intact without significant lesions or rashes. Psych:  Alert and cooperative. Normal mood and affect.

## 2012-10-20 NOTE — Progress Notes (Signed)
CC'd to PCP 

## 2012-10-20 NOTE — Assessment & Plan Note (Signed)
79 year old female with chronic constipation, unable to tolerate Linzess, doing well on Miralax prn. Likely RUQ discomfort secondary to IBS. CT on file. Overall, discomfort significantly improved with better control of bowel habits. Return in 6 months unless otherwise indicated.

## 2012-10-20 NOTE — Patient Instructions (Addendum)
Continue to take Miralax daily as needed for a bowel movement.   If the pain returns, please call us.  Otherwise, we will see you in 6 months!

## 2013-02-12 ENCOUNTER — Telehealth: Payer: Self-pay | Admitting: Internal Medicine

## 2013-02-12 DIAGNOSIS — R1011 Right upper quadrant pain: Secondary | ICD-10-CM

## 2013-02-12 DIAGNOSIS — R945 Abnormal results of liver function studies: Secondary | ICD-10-CM

## 2013-02-12 NOTE — Telephone Encounter (Signed)
Pt's family member called with some concerns about the patient. She was wanting to speak with nurse, but little information was given other than patient was seen in the ER and patient has had 2 ERCP in the past. Please call [phone]

## 2013-02-12 NOTE — Telephone Encounter (Signed)
I spoke with pt. She said last night she had a pain on her right side that was similar to the pain she had before when she had the ERCP. The pain was not as intense and it lasted for 45 minutes and then stopped. Pt is concerned that she may have another stone in her bile duct. Pt is not in any pain now but wants to know if there is anything she can do about it before the pain returns and becomes severe or does she just wait and see if it happens again. Please advise.

## 2013-02-12 NOTE — Telephone Encounter (Signed)
If pain recurs, need to arrange a set of LFTs about 6 hours after pain started.  If she has recurrent pain, she needs an office visit for this problem.

## 2013-02-13 ENCOUNTER — Other Ambulatory Visit: Payer: Self-pay

## 2013-02-13 DIAGNOSIS — R945 Abnormal results of liver function studies: Secondary | ICD-10-CM

## 2013-02-13 DIAGNOSIS — R1011 Right upper quadrant pain: Secondary | ICD-10-CM

## 2013-02-13 NOTE — Telephone Encounter (Signed)
Pt is aware and lab order has been faxed to the lab. 

## 2013-02-14 ENCOUNTER — Other Ambulatory Visit: Payer: Self-pay

## 2013-02-14 ENCOUNTER — Emergency Department (HOSPITAL_COMMUNITY): Payer: Medicare Other

## 2013-02-14 ENCOUNTER — Encounter (HOSPITAL_COMMUNITY): Payer: Self-pay | Admitting: Emergency Medicine

## 2013-02-14 ENCOUNTER — Inpatient Hospital Stay (HOSPITAL_COMMUNITY)
Admission: EM | Admit: 2013-02-14 | Discharge: 2013-02-19 | DRG: 440 | Disposition: A | Discharge status: Home / Self Care | Payer: Medicare Other | Attending: Internal Medicine | Admitting: Internal Medicine

## 2013-02-14 DIAGNOSIS — R945 Abnormal results of liver function studies: Secondary | ICD-10-CM

## 2013-02-14 DIAGNOSIS — R748 Abnormal levels of other serum enzymes: Secondary | ICD-10-CM

## 2013-02-14 DIAGNOSIS — D72829 Elevated white blood cell count, unspecified: Secondary | ICD-10-CM | POA: Diagnosis present

## 2013-02-14 DIAGNOSIS — R7989 Other specified abnormal findings of blood chemistry: Secondary | ICD-10-CM

## 2013-02-14 DIAGNOSIS — M1990 Unspecified osteoarthritis, unspecified site: Secondary | ICD-10-CM | POA: Diagnosis present

## 2013-02-14 DIAGNOSIS — Z8249 Family history of ischemic heart disease and other diseases of the circulatory system: Secondary | ICD-10-CM

## 2013-02-14 DIAGNOSIS — M109 Gout, unspecified: Secondary | ICD-10-CM | POA: Diagnosis present

## 2013-02-14 DIAGNOSIS — Z823 Family history of stroke: Secondary | ICD-10-CM

## 2013-02-14 DIAGNOSIS — K760 Fatty (change of) liver, not elsewhere classified: Secondary | ICD-10-CM

## 2013-02-14 DIAGNOSIS — I10 Essential (primary) hypertension: Secondary | ICD-10-CM | POA: Diagnosis present

## 2013-02-14 DIAGNOSIS — K7689 Other specified diseases of liver: Secondary | ICD-10-CM

## 2013-02-14 DIAGNOSIS — K8050 Calculus of bile duct without cholangitis or cholecystitis without obstruction: Secondary | ICD-10-CM | POA: Diagnosis present

## 2013-02-14 DIAGNOSIS — R17 Unspecified jaundice: Secondary | ICD-10-CM

## 2013-02-14 DIAGNOSIS — R109 Unspecified abdominal pain: Secondary | ICD-10-CM | POA: Insufficient documentation

## 2013-02-14 DIAGNOSIS — K5900 Constipation, unspecified: Secondary | ICD-10-CM | POA: Diagnosis present

## 2013-02-14 DIAGNOSIS — R1011 Right upper quadrant pain: Secondary | ICD-10-CM

## 2013-02-14 DIAGNOSIS — K859 Acute pancreatitis without necrosis or infection, unspecified: Principal | ICD-10-CM

## 2013-02-14 DIAGNOSIS — Z87891 Personal history of nicotine dependence: Secondary | ICD-10-CM

## 2013-02-14 HISTORY — DX: Calculus of bile duct without cholangitis or cholecystitis without obstruction: K80.50

## 2013-02-14 LAB — COMPREHENSIVE METABOLIC PANEL
ALT: 493 U/L — ABNORMAL HIGH (ref 0–35)
AST: 354 U/L — ABNORMAL HIGH (ref 0–37)
Albumin: 4 g/dL (ref 3.5–5.2)
Alkaline Phosphatase: 217 U/L — ABNORMAL HIGH (ref 39–117)
BUN: 11 mg/dL (ref 6–23)
CO2: 31 mEq/L (ref 19–32)
Calcium: 10.8 mg/dL — ABNORMAL HIGH (ref 8.4–10.5)
Chloride: 96 mEq/L (ref 96–112)
Creatinine, Ser: 0.91 mg/dL (ref 0.50–1.10)
GFR calc Af Amer: 62 mL/min — ABNORMAL LOW (ref >90)
GFR calc non Af Amer: 54 mL/min — ABNORMAL LOW (ref >90)
Glucose, Bld: 94 mg/dL (ref 70–99)
Potassium: 3.8 mEq/L (ref 3.5–5.1)
Sodium: 140 mEq/L (ref 135–145)
Total Bilirubin: 2.9 mg/dL — ABNORMAL HIGH (ref 0.3–1.2)
Total Protein: 8.2 g/dL (ref 6.0–8.3)

## 2013-02-14 LAB — CBC WITH DIFFERENTIAL/PLATELET
Basophils Absolute: 0 10*3/uL (ref 0.0–0.1)
Basophils Relative: 0 % (ref 0–1)
Eosinophils Absolute: 0.1 10*3/uL (ref 0.0–0.7)
Eosinophils Relative: 0 % (ref 0–5)
HCT: 43.6 % (ref 36.0–46.0)
Hemoglobin: 14.8 g/dL (ref 12.0–15.0)
Lymphocytes Relative: 4 % — ABNORMAL LOW (ref 12–46)
Lymphs Abs: 0.5 10*3/uL — ABNORMAL LOW (ref 0.7–4.0)
MCH: 29.5 pg (ref 26.0–34.0)
MCHC: 33.9 g/dL (ref 30.0–36.0)
MCV: 87 fL (ref 78.0–100.0)
Monocytes Absolute: 2 10*3/uL — ABNORMAL HIGH (ref 0.1–1.0)
Monocytes Relative: 13 % — ABNORMAL HIGH (ref 3–12)
Neutro Abs: 12.6 10*3/uL — ABNORMAL HIGH (ref 1.7–7.7)
Neutrophils Relative %: 83 % — ABNORMAL HIGH (ref 43–77)
Platelets: 243 10*3/uL (ref 150–400)
RBC: 5.01 MIL/uL (ref 3.87–5.11)
RDW: 14.8 % (ref 11.5–15.5)
WBC: 15.2 10*3/uL — ABNORMAL HIGH (ref 4.0–10.5)

## 2013-02-14 LAB — URINALYSIS, ROUTINE W REFLEX MICROSCOPIC
Bilirubin Urine: NEGATIVE
Glucose, UA: NEGATIVE mg/dL
Hgb urine dipstick: NEGATIVE
Ketones, ur: NEGATIVE mg/dL
Leukocytes, UA: NEGATIVE
Nitrite: NEGATIVE
Protein, ur: NEGATIVE mg/dL
Specific Gravity, Urine: 1.015 (ref 1.005–1.030)
Urobilinogen, UA: 1 mg/dL (ref 0.0–1.0)
pH: 8 (ref 5.0–8.0)

## 2013-02-14 LAB — LIPASE, BLOOD: Lipase: 25 U/L (ref 11–59)

## 2013-02-14 MED ORDER — FENTANYL 100 MCG/HR TD PT72: 100.0000 ug | MEDICATED_PATCH | TRANSDERMAL | Status: DC

## 2013-02-14 MED ORDER — SODIUM CHLORIDE 0.9 % IJ SOLN
INTRAMUSCULAR | Status: AC
  Filled 2013-02-14: qty 800

## 2013-02-14 MED ORDER — ONDANSETRON HCL 4 MG/2ML IJ SOLN
4.0000 mg | Freq: Four times a day (QID) | INTRAMUSCULAR | Status: DC | PRN
  Administered 2013-02-16 – 2013-02-18 (×2): 4 mg via INTRAVENOUS
  Filled 2013-02-14 (×2): qty 2

## 2013-02-14 MED ORDER — ACETAMINOPHEN 325 MG PO TABS: 650.0000 mg | ORAL_TABLET | Freq: Four times a day (QID) | ORAL | Status: DC | PRN

## 2013-02-14 MED ORDER — FENTANYL 100 MCG/HR TD PT72
100.0000 ug | MEDICATED_PATCH | TRANSDERMAL | Status: DC
  Administered 2013-02-16 – 2013-02-19 (×2): 100 ug via TRANSDERMAL
  Filled 2013-02-14 (×2): qty 1

## 2013-02-14 MED ORDER — ALBUTEROL SULFATE (5 MG/ML) 0.5% IN NEBU: 2.5000 mg | INHALATION_SOLUTION | RESPIRATORY_TRACT | Status: DC | PRN

## 2013-02-14 MED ORDER — SODIUM CHLORIDE 0.9 % IV BOLUS (SEPSIS)
500.0000 mL | Freq: Once | INTRAVENOUS | Status: AC
  Administered 2013-02-14: 500 mL via INTRAVENOUS

## 2013-02-14 MED ORDER — ACETAMINOPHEN 650 MG RE SUPP: 650.0000 mg | Freq: Four times a day (QID) | RECTAL | Status: DC | PRN

## 2013-02-14 MED ORDER — MORPHINE SULFATE 4 MG/ML IJ SOLN
4.0000 mg | Freq: Once | INTRAMUSCULAR | Status: AC
  Administered 2013-02-14: 4 mg via INTRAVENOUS
  Filled 2013-02-14: qty 1

## 2013-02-14 MED ORDER — SODIUM CHLORIDE 0.9 % IV SOLN
3.0000 g | Freq: Three times a day (TID) | INTRAVENOUS | Status: DC
  Administered 2013-02-14 – 2013-02-19 (×15): 3 g via INTRAVENOUS
  Filled 2013-02-14 (×17): qty 3

## 2013-02-14 MED ORDER — HEPARIN SODIUM (PORCINE) 5000 UNIT/ML IJ SOLN
5000.0000 [IU] | Freq: Three times a day (TID) | INTRAMUSCULAR | Status: DC
  Administered 2013-02-14 – 2013-02-16 (×7): 5000 [IU] via SUBCUTANEOUS
  Filled 2013-02-14 (×7): qty 1

## 2013-02-14 MED ORDER — POTASSIUM CHLORIDE IN NACL 20-0.9 MEQ/L-% IV SOLN
INTRAVENOUS | Status: DC
  Administered 2013-02-14 – 2013-02-18 (×8): via INTRAVENOUS

## 2013-02-14 MED ORDER — SODIUM CHLORIDE 0.9 % IV SOLN
INTRAVENOUS | Status: DC
  Administered 2013-02-14: 08:00:00 via INTRAVENOUS

## 2013-02-14 MED ORDER — IOHEXOL 300 MG/ML  SOLN
80.0000 mL | Freq: Once | INTRAMUSCULAR | Status: AC | PRN
  Administered 2013-02-14: 80 mL via INTRAVENOUS

## 2013-02-14 MED ORDER — ONDANSETRON HCL 4 MG/2ML IJ SOLN
4.0000 mg | Freq: Once | INTRAMUSCULAR | Status: DC
  Filled 2013-02-14: qty 2

## 2013-02-14 MED ORDER — PANTOPRAZOLE SODIUM 40 MG IV SOLR
40.0000 mg | INTRAVENOUS | Status: DC
  Administered 2013-02-14: 40 mg via INTRAVENOUS
  Filled 2013-02-14: qty 40

## 2013-02-14 MED ORDER — IOHEXOL 300 MG/ML  SOLN
50.0000 mL | Freq: Once | INTRAMUSCULAR | Status: AC | PRN
  Administered 2013-02-14: 50 mL via ORAL

## 2013-02-14 MED ORDER — ONDANSETRON HCL 4 MG/2ML IJ SOLN
4.0000 mg | Freq: Once | INTRAMUSCULAR | Status: AC
  Administered 2013-02-14: 4 mg via INTRAVENOUS

## 2013-02-14 MED ORDER — ONDANSETRON HCL 4 MG PO TABS: 4.0000 mg | ORAL_TABLET | Freq: Four times a day (QID) | ORAL | Status: DC | PRN

## 2013-02-14 MED ORDER — ALLOPURINOL 100 MG PO TABS
100.0000 mg | ORAL_TABLET | Freq: Every day | ORAL | Status: DC
  Administered 2013-02-15 – 2013-02-19 (×5): 100 mg via ORAL
  Filled 2013-02-14 (×6): qty 1

## 2013-02-14 MED ORDER — MORPHINE SULFATE 4 MG/ML IJ SOLN
4.0000 mg | INTRAMUSCULAR | Status: DC | PRN
  Administered 2013-02-14 – 2013-02-19 (×17): 4 mg via INTRAVENOUS
  Filled 2013-02-14 (×17): qty 1

## 2013-02-14 MED ORDER — SODIUM CHLORIDE 0.9 % IV BOLUS (SEPSIS): 1000.0000 mL | Freq: Once | INTRAVENOUS | Status: DC

## 2013-02-14 NOTE — Consult Note (Signed)
Referring Provider: No ref. provider found Primary Care Physician:  Avon Gully, MD Primary Gastroenterologist:  Jonette Eva  Reason for Consultation:  ELEVATED LIVER ENZYMES  HPI:  PT SEEN AND EVALUATED IN MAY 2014 FOR CBD STONES/ s/p ERCP/Sx/STONE EXTRACTION. AMPULLA SIT ON/NEAR A DUODENAL DIVERTICULUM. PT INTERMITTENTLY HAS HAD RUQ/R FLANK PAIN AND NAUSEA/VOMITING 1-2X/MO. SX WOULD LAST ~20 MINS. LAST NIGHT PAIN AWAKENED HER AND WAS ASSOCIATED WITH SEVERE NAUSEA & CHILLS. NO FEVER. CAME TO ED FOR AN EVALUATION. RUQ U/S SHOWS NL CBD. PANCREAS NOT SEEN. AWAITING CT ABD W/ IVC. PAIN IMPROVED.   Past Medical History  Diagnosis Date  . Gout   . Osteoarthritis   . Leg cramps     Past Surgical History  Procedure Laterality Date  . Cholecystectomy    . Appendectomy    . Cataract extraction Bilateral   . Wrist arthroplasty Right   . Right femur      metal plate  . Breast biopsy Left   . Ercp  05/23/2012    RMR: juxta-ampullary duodenal diverticulum, markedly dilated biliary tree with choledocholithiasis, s/p sphincterotomy with biliary sphincterotomy balloon dilation, balloon and basket stone extraction, stent placement  . Removal of stones  05/23/2012    Procedure: BALLOON AND BASKET REMOVAL OF STONES;  Surgeon: Corbin Ade, MD;  Location: [facility];  Service: Endoscopy;;  . Sphincterotomy  05/23/2012    Procedure: Dennison Mascot;  Surgeon: Corbin Ade, MD;  Location: [facility];  Service: Endoscopy;;  . Biliary stent placement  05/23/2012    Procedure: BILIARY STENT PLACEMENT;  Surgeon: Corbin Ade, MD;  Location: [facility];  Service: Endoscopy;;  . Balloon dilation  05/23/2012    Procedure: AMPULLARY BALLOON DILATION;  Surgeon: Corbin Ade, MD;  Location: [facility];  Service: Endoscopy;;  . Ercp N/A 07/24/2012    [id number] duodenal diverticulum/s/p dilation sphincterotomy site s/p basket stone extraction s/p cholangioscopy with holmium lithotripsy of common duct stones  .  Spyglass cholangioscopy N/A 07/24/2012    Procedure: YNWGNFAO CHOLANGIOSCOPY;  Surgeon: Corbin Ade, MD;  Location: [facility];  Service: Endoscopy;  Laterality: N/A;  . Spyglass lithotripsy N/A 07/24/2012    Procedure: ZHYQMVHQ LITHOTRIPSY;  Surgeon: Corbin Ade, MD;  Location: [facility];  Service: Endoscopy;  Laterality: N/A;  . Removal of stones N/A 07/24/2012    Procedure: REMOVAL OF STONES;  Surgeon: Corbin Ade, MD;  Location: [facility];  Service: Endoscopy;  Laterality: N/A;  . Balloon dilation N/A 07/24/2012    Procedure: BALLOON DILATION; Balloon basket dredge and balloon dialate sphincterotomy;  Surgeon: Corbin Ade, MD;  Location: [facility];  Service: Endoscopy;  Laterality: N/A;  . Holmium laser application N/A 07/24/2012    Procedure: HOLMIUM LASER APPLICATION;  Surgeon: Corbin Ade, MD;  Location: [facility];  Service: Endoscopy;  Laterality: N/A;    Prior to Admission medications   Medication Sig Start Date End Date Taking? Authorizing Provider  allopurinol (ZYLOPRIM) 100 MG tablet Take 100 mg by mouth daily.   Yes Historical Provider, MD  Calcium Carbonate-Vit D-Min (CALTRATE 600+D PLUS MINERALS) 600-800 MG-UNIT TABS Take 1 tablet by mouth 2 (two) times daily.   Yes Historical Provider, MD  felodipine (PLENDIL) 10 MG 24 hr tablet Take 10 mg by mouth daily.   Yes Historical Provider, MD  fentaNYL (DURAGESIC - DOSED MCG/HR) 100 MCG/HR Place 1 patch onto the skin every 3 (three) days.   Yes Historical Provider, MD  Ibuprofen-Diphenhydramine Cit (ADVIL PM) 200-38 MG TABS Take  1 tablet by mouth at bedtime as needed (sleep/pain).   Yes Historical Provider, MD  polyethylene glycol (MIRALAX / GLYCOLAX) packet Take 17 g by mouth daily as needed for moderate constipation.   Yes Historical Provider, MD  potassium chloride SA (K-DUR,KLOR-CON) 20 MEQ tablet Take 40 mEq by mouth 2 (two) times daily.   Yes Historical Provider, MD  torsemide (DEMADEX) 20 MG tablet Take 20 mg by mouth daily.   Yes  Historical Provider, MD  traMADol-acetaminophen (ULTRACET) 37.5-325 MG per tablet Take 1 tablet by mouth 3 (three) times daily as needed for pain.   Yes Historical Provider, MD   Allergies as of 02/14/2013  . (No Known Allergies)    Family History:  Colon Cancer  negative                           Polyps  negative   History   Social History  . Marital Status: Widowed    Spouse Name: N/A    Number of Children: 1  . Years of Education: N/A   Occupational History  . 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 Smoking status: Former Smoker -- 0.25 packs/day for 15 years    Types: Cigarettes    Quit date: 07/17/1988  . Smokeless tobacco: Not on file  . Alcohol Use: No  . Drug Use: No  . Sexual Activity: Yes    Birth Control/ Protection: Post-menopausal   Social History Narrative   Lives alone   Candiss Norse, niece & caregiver, as well as POA         Review of Systems: PER HPI OTHERWISE ALL SYSTEMS ARE NEGATIVE.   Vitals: Blood pressure 137/65, pulse 93, temperature 98.5 F (36.9 C), temperature source Oral, resp. rate 18, SpO2 97.00%.  Physical Exam: General:   Alert,  Well-developed, well-nourished, pleasant and cooperative in NAD Head:  Normocephalic and atraumatic. Eyes:  Sclera clear, no icterus.   Conjunctiva pink. Mouth:  No deformity or lesions Neck:  Supple; no masses. Lungs:  Clear throughout to auscultation.   No wheezes. No acute distress. Heart:  Regular rate and rhythm; no murmurs. Abdomen:  Soft, MILD TTP IN RUW and nondistended. No masses noted. Normal bowel sounds, without guarding, and without rebound.   Msk:  Symmetrical without gross deformities.  Extremities:  Without edema. Neurologic:  Alert and  oriented x4;  grossly normal neurologically. Cervical Nodes:  No significant cervical adenopathy. Psych:  Alert and cooperative. Normal mood and affect.   Lab Results:  Recent Labs  02/14/13 0808  WBC 15.2*  HGB 14.8  HCT 43.6  PLT  243   BMET  Recent Labs  02/14/13 0808  NA 140  K 3.8  CL 96  CO2 31  GLUCOSE 94  BUN 11  CREATININE 0.91  CALCIUM 10.8*   LFT  Recent Labs  02/14/13 0808  PROT 8.2  ALBUMIN 4.0  AST 354*  ALT 493*  ALKPHOS 217*  BILITOT 2.9*     Studies/Results: PER HPI, PENDING CT ABD  Impression: ADMITTED WITH ELEVATED LIVER ENZYMES/ABD PAIN/ ELEVATED WBC. RUQ U/S SHOWS NL CBD. PANCREAS NOT VISUALIZED. DIFFERENTIAL DIAGNOSIS INCLUDES ACUTE HEPATITIS, LESS LIKELY CBD STONE, MASS IN HOP, OR DUODENAL DIVERTICULITIS INVOLVING THE DISTAL CBD.  Plan: 1. ADD UNASYN 2. CT ABD W/ IV CONTRAST TO EVALUATE PANCREAS AND FOR CBD STONE 3. ADMIT FOR HYDRATION/IV ABX 4. CONSIDER MRCP IF CT NEG FOR CBD STONES/MASS IN  HOP AND ERCP IF DISTAL CBD STONE SEEN 5. DAILY PPI 6. CHECK DIRECT BILIRUBIN & LIPASE 7. FULL LIQUID DIET  LOS: 0 days   [facility]  02/14/2013, 11:24 AM

## 2013-02-14 NOTE — H&P (Signed)
[facility] History and Physical  Lori Bishop ZOX:[id number] DOB: 01-15-1921 DOA: 02/14/2013  Referring physician: ED physician, Lori Bishop PCP: Lori Gully, MD   Chief Complaint: Abdominal pain.  HPI: Lori Bishop is a 79 y.o. female with a history significant for choledocholithiasis, status post ERCP and sphincterotomy x2, osteoarthritis/gout, and hypertension, who presents to the emergency department today with a chief complaint of abdominal pain. She was in her usual state of health until early this morning when she had a severe bout of right upper quadrant/right flank pain that radiated to her back. The pain eased off,  but it again intensified later in the morning. She describes the pain as a sharp pain. At the time, it was 10 over 10 in intensity and persistent. She had nausea but no vomiting. She had chills but no fever. Her last bowel movement was 2 days ago. She denies diarrhea or constipation. No evidence of black tarry stools or bright red blood in her stools. She denies any recent changes in her medications or foods.  In the emergency department, she is afebrile and hemodynamically stable. Her lab data are significant for a WBC of 15.2, lipase of 25, AST of 354, ALT of 493, and total bilirubin of 2.9. Her urinalysis is unremarkable. Her abdominal ultrasound reveals no acute abnormality and status post cholecystectomy. Lori Bishop, Lori Bishop and evaluated the patient and ordered a CT of her abdomen and pelvis with contrast. It reveals findings suspicious for early or mild pancreatitis, hepatic steatosis, and stable intra-/extrahepatic biliary ductal dilatation. She is being admitted for further evaluation and management.     Review of Systems:  Constitutional:  No weight loss, night sweats, Fevers, fatigue.  HEENT:  No headaches, Difficulty swallowing,Tooth/dental problems,Sore throat,  No sneezing, itching, ear ache, nasal congestion, post nasal drip,   Cardio-vascular:  No chest pain, Orthopnea, PND,  anasarca, dizziness, palpitations  GI:  No  vomiting, diarrhea, change in bowel habits Resp:  No shortness of breath with exertion or at rest. No excess mucus, no productive cough, No non-productive cough, No coughing up of blood.No change in color of mucus.No wheezing.No chest wall deformity  Skin:  no rash or lesions.  GU:  no dysuria, change in color of urine, no urgency or frequency.  Musculoskeletal:  She has chronic arthritic pain in her back and her legs.  Psych:  No change in mood or affect. No depression or anxiety. No memory loss.   Past Medical History  Diagnosis Date  . Gout   . Osteoarthritis   . Leg cramps   . Choledocholithiasis 3 2014 and 5 2014    S/P ERCP and sphincterotomy x2   Past Surgical History  Procedure Laterality Date  . Cholecystectomy    . Appendectomy    . Cataract extraction Bilateral   . Wrist arthroplasty Right   . Right femur      metal plate  . Breast biopsy Left   . Ercp  05/23/2012    RMR: juxta-ampullary duodenal diverticulum, markedly dilated biliary tree with choledocholithiasis, s/p sphincterotomy with biliary sphincterotomy balloon dilation, balloon and basket stone extraction, stent placement  . Removal of stones  05/23/2012    Procedure: BALLOON AND BASKET REMOVAL OF STONES;  Surgeon: Lori Ade, MD;  Location: [facility];  Service: Endoscopy;;  . Sphincterotomy  05/23/2012    Procedure: Lori Bishop;  Surgeon: Lori Ade, MD;  Location: [facility];  Service: Endoscopy;;  . Biliary stent placement  05/23/2012    Procedure:  BILIARY STENT PLACEMENT;  Surgeon: Lori Ade, MD;  Location: [facility];  Service: Endoscopy;;  . Balloon dilation  05/23/2012    Procedure: AMPULLARY BALLOON DILATION;  Surgeon: Lori Ade, MD;  Location: [facility];  Service: Endoscopy;;  . Ercp N/A 07/24/2012    [id number] duodenal diverticulum/s/p dilation sphincterotomy site s/p basket stone  extraction s/p cholangioscopy with holmium lithotripsy of common duct stones  . Spyglass cholangioscopy N/A 07/24/2012    Procedure: YNWGNFAO CHOLANGIOSCOPY;  Surgeon: Lori Ade, MD;  Location: [facility];  Service: Endoscopy;  Laterality: N/A;  . Spyglass lithotripsy N/A 07/24/2012    Procedure: ZHYQMVHQ LITHOTRIPSY;  Surgeon: Lori Ade, MD;  Location: [facility];  Service: Endoscopy;  Laterality: N/A;  . Removal of stones N/A 07/24/2012    Procedure: REMOVAL OF STONES;  Surgeon: Lori Ade, MD;  Location: [facility];  Service: Endoscopy;  Laterality: N/A;  . Balloon dilation N/A 07/24/2012    Procedure: BALLOON DILATION; Balloon basket dredge and balloon dialate sphincterotomy;  Surgeon: Lori Ade, MD;  Location: [facility];  Service: Endoscopy;  Laterality: N/A;  . Holmium laser application N/A 07/24/2012    Procedure: HOLMIUM LASER APPLICATION;  Surgeon: Lori Ade, MD;  Location: [facility];  Service: Endoscopy;  Laterality: N/A;   Social History: She is widowed. She is a retired Engineer, civil (consulting). She lives alone in [place]. Her daughter, Mrs. Lori Bishop looks in on her frequently. Her son is deceased. She in place with a walker. She has a history of smoking cigarettes, but quit 24 years ago. She denies illicit drug use or alcohol use.  No Known Allergies  Family History  Problem Relation Age of Onset  . Stroke Father   . CAD Mother   . Cancer Son     ?metastatic   Her mother is deceased and may have died of a heart attack. Her father died of a stroke.     Prior to Admission medications   Medication Sig Start Date End Date Taking? Authorizing Provider  allopurinol (ZYLOPRIM) 100 MG tablet Take 100 mg by mouth daily.   Yes Historical Provider, MD  Calcium Carbonate-Vit D-Min (CALTRATE 600+D PLUS MINERALS) 600-800 MG-UNIT TABS Take 1 tablet by mouth 2 (two) times daily.   Yes Historical Provider, MD  felodipine (PLENDIL) 10 MG 24 hr tablet Take 10 mg by mouth daily.   Yes Historical Provider, MD   fentaNYL (DURAGESIC - DOSED MCG/HR) 100 MCG/HR Place 1 patch onto the skin every 3 (three) days.   Yes Historical Provider, MD  Ibuprofen-Diphenhydramine Cit (ADVIL PM) 200-38 MG TABS Take 1 tablet by mouth at bedtime as needed (sleep/pain).   Yes Historical Provider, MD  polyethylene glycol (MIRALAX / GLYCOLAX) packet Take 17 g by mouth daily as needed for moderate constipation.   Yes Historical Provider, MD  potassium chloride SA (K-DUR,KLOR-CON) 20 MEQ tablet Take 40 mEq by mouth 2 (two) times daily.   Yes Historical Provider, MD  torsemide (DEMADEX) 20 MG tablet Take 20 mg by mouth daily.   Yes Historical Provider, MD  traMADol-acetaminophen (ULTRACET) 37.5-325 MG per tablet Take 1 tablet by mouth 3 (three) times daily as needed for pain.   Yes Historical Provider, MD   Physical Exam: Filed Vitals:   02/14/13 1357  BP: 145/73  Pulse: 68  Temp:   Resp: 18    BP 145/73  Pulse 68  Temp(Src) 98.6 F (37 C) (Oral)  Resp 18  SpO2 100%  General: Pleasant 79 year old African-American  woman who appears to be younger than her stated age. She is in no acute distress. HEENT: Head is normocephalic, nontraumatic. Pupils are equal, round, and reactive to light. Extraocular was are intact. Conjunctivae are clear. Sclerae are white. Oropharynx reveals mildly dry mucous membranes. Edentulous. No posterior exudates or erythema. Nasal mucosa is dry. Neck: Supple, no adenopathy, no thyromegaly, no JVD. Lungs: Clear to auscultation bilaterally. Heart: S1, S2, with an occasional ectopic beat. Abdomen: Positive bowel sounds, soft, mildly tender in the epigastric and right upper quadrant. No rebound, no masses palpated, no hepatosplenomegaly, no distention. GU or rectal: Deferred. Extremities: Pedal pulses palpable bilaterally. No pedal edema and no pretibial edema. No acute hot red joints. Psychiatric: She is alert and oriented x3. Her speech is clear. She has a pleasant affect. Neurologic: Cranial  nerves II through XII are intact. Strength is 5 over 5 in the supine position.           Labs on Admission:  Basic Metabolic Panel:  Recent Labs Lab 02/14/13 0808  NA 140  K 3.8  CL 96  CO2 31  GLUCOSE 94  BUN 11  CREATININE 0.91  CALCIUM 10.8*   Liver Function Tests:  Recent Labs Lab 02/14/13 0808  AST 354*  ALT 493*  ALKPHOS 217*  BILITOT 2.9*  PROT 8.2  ALBUMIN 4.0    Recent Labs Lab 02/14/13 1125  LIPASE 25   No results found for this basename: AMMONIA,  in the last 168 hours CBC:  Recent Labs Lab 02/14/13 0808  WBC 15.2*  NEUTROABS 12.6*  HGB 14.8  HCT 43.6  MCV 87.0  PLT 243   Cardiac Enzymes: No results found for this basename: CKTOTAL, CKMB, CKMBINDEX, TROPONINI,  in the last 168 hours  BNP (last 3 results) No results found for this basename: PROBNP,  in the last 8760 hours CBG: No results found for this basename: GLUCAP,  in the last 168 hours  Radiological Exams on Admission: Ct Abdomen W Contrast  02/14/2013   CLINICAL DATA:  Abdominal pain, elevated liver enzymes  EXAM: CT ABDOMEN WITH CONTRAST  TECHNIQUE: Multidetector CT imaging of the abdomen was performed using the standard protocol following bolus administration of intravenous contrast.  CONTRAST:  50mL OMNIPAQUE IOHEXOL 300 MG/ML SOLN, 80mL OMNIPAQUE IOHEXOL 300 MG/ML SOLN  COMPARISON:  07/09/2012  FINDINGS: Ill-defined area of increased density projects in the left lung base primarily within the region of the medial basal segment. Hypoventilation is appreciated within the right lung base.  The liver demonstrates a diffuse low attenuating architecture as well as a very fine reticular pattern. No focal regions of abnormal attenuation or enhancement appreciated. Stable intra and extrahepatic biliary ductal dilatation is identified.  Evaluation of the pancreas demonstrates an area of heterogeneous enhancement within the uncinate process and hand. Mild inflammatory changes appreciated within  the a tail region of the pancreas as well as areas of loss of the normal undulating border. No a focal loculated fluid collections are identified associated with the pancreas.  The adrenals, kidneys, and spleen are unremarkable.  There is no evidence of bowel obstruction nor secondary signs reflecting enteritis, colitis or diverticulitis within the visualized portions of the bowel. A small fat containing ventral wall hernia is identified measuring 2.3 x 2.2 cm stable.  There is no evidence of abdominal aortic aneurysm. Atherosclerotic calcifications identified within the aorta.  IMPRESSION:    : IMPRESSION:      *Atelectasis within the left lung base and infiltrate cannot be  excluded if clinically appropriate *Findings raise suspicion of early or mild pancreatitis correlation with pancreatic function tests is recommended *Hepatic steatosis as well as findings which may reflect a underlying component of cirrhotic changes within the liver. *Stable intra and extrahepatic biliary ductal dilatation *Atherosclerotic changes within the aorta   Electronically Signed   By: Salome Holmes M.D.   On: 02/14/2013 13:32   US Abdomen Limited Ruq  02/14/2013   CLINICAL DATA:  Right upper quadrant pain  EXAM: US ABDOMEN LIMITED - RIGHT UPPER QUADRANT  COMPARISON:  05/23/2012  FINDINGS: Gallbladder  Surgically removed.  Common bile duct  Diameter: 8.1 mm. Mild intrahepatic ductal dilatation is noted as well consistent with the post cholecystectomy state.  Liver:  Mild biliary ductal dilatation is noted. No focal mass lesion is seen.  IMPRESSION: Status post cholecystectomy.  No acute abnormality is noted.   Electronically Signed   By: Alcide Clever M.D.   On: 02/14/2013 10:12    EKG: Independently reviewed. Normal sinus rhythm with PCPs, heart rate 88 beats per minute.  Assessment/Plan Principal Problem:   Acute pancreatitis Active Problems:   Elevated LFTs   Hyperbilirubinemia   RUQ pain   Hepatic steatosis    Leukocytosis   GOUT   HYPERTENSION   Osteoarthritis   This is a pleasant 79 year old woman with a history of choledocholithiasis and ERCP x2, who presents with right upper quadrant/epigastric abdominal pain and chills. Radiographically, she has early signs of acute pancreatitis, although, her lipase is within normal limits. She also has elevated LFTs and hyperbilirubinemia, query cholestasis. The etiology of the pancreatitis and hepatitis is unclear, but given her history, certainly a small retained stone is a possibility. Her white blood cell count is elevated which may be reactive. She is afebrile and nontoxic appearing. Lori Bishop, Lori Bishop has already evaluated the patient and made recommendations. She started Unasyn empirically.    Plan: 1. We'll keep the patient n.p.o. with exception of ice chips for bowel rest. 2. When necessary IV analgesics and IV antiemetics will be ordered. 3. IV Protonix empirically. 4. We'll continue allopurinol we'll hold all other oral medications. 5. Will order a followup CBC, lipase, hepatic function panel, and basic metabolic panel in the morning. 6. Further evaluation will be deferred to Lori Bishop and Dr. Felecia Shelling.   Code Status: Full code Family Communication: Discussed with the patient's daughter and granddaughter Disposition Plan: Anticipate discharge to home in 2-4 days.  Time spent: One hour.  [facility] Pager [phone]

## 2013-02-14 NOTE — ED Notes (Signed)
Pt comes from home via EMS with c/o intermittent abdominal pain x1-2 weeks. Pt states pain become "much worse" last night and this morning. Pt states pain is right side and radiates to right lower back. Pt reports nausea but denies V/D, fever.

## 2013-02-14 NOTE — ED Provider Notes (Signed)
CSN: [id number]     Arrival date & time 02/14/13  4098 History  This chart was scribed for [name], MD by Luisa Dago, ED Scribe. This patient was seen in room [facility]14[facility]14 and the patient's care was started at 8:02 AM.    Chief Complaint  Patient presents with  . Abdominal Pain   The history is provided by the patient. No language interpreter was used.   HPI Comments: Lori Bishop is a 79 y.o. female who presents to the Emergency Department by ambulance from home complaining of intermittent RUQ abdominal pain for the past 1-2 weeks. She reports that the most recent episode woke her up early this morning. Pt reports that the pain then subsided and she was able to return to sleep. However, the pain returned again several hours later accompanied by nausea and has been gradually worsening since. Pt states that the pain radiates to her right lower back. Pt reports that the pain in similar to prior episodes of liver stones and gallstones; however, she has a h/o a cholecystectomy. She has had prior ERCP procedures performed by Dr. Kendell Bane in the past with improvement. She denies having a h/o kidney stones. She denies fever, chills, abdominal bloating, emesis and dysuria.  Past Medical History  Diagnosis Date  . Gout   . Osteoarthritis   . Leg cramps    Past Surgical History  Procedure Laterality Date  . Cholecystectomy    . Appendectomy    . Cataract extraction Bilateral   . Wrist arthroplasty Right   . Right femur      metal plate  . Breast biopsy Left   . Ercp  05/23/2012    RMR: juxta-ampullary duodenal diverticulum, markedly dilated biliary tree with choledocholithiasis, s/p sphincterotomy with biliary sphincterotomy balloon dilation, balloon and basket stone extraction, stent placement  . Removal of stones  05/23/2012    Procedure: BALLOON AND BASKET REMOVAL OF STONES;  Surgeon: Corbin Ade, MD;  Location: [facility];  Service: Endoscopy;;  . Sphincterotomy  05/23/2012     Procedure: Dennison Mascot;  Surgeon: Corbin Ade, MD;  Location: [facility];  Service: Endoscopy;;  . Biliary stent placement  05/23/2012    Procedure: BILIARY STENT PLACEMENT;  Surgeon: Corbin Ade, MD;  Location: [facility];  Service: Endoscopy;;  . Balloon dilation  05/23/2012    Procedure: AMPULLARY BALLOON DILATION;  Surgeon: Corbin Ade, MD;  Location: [facility];  Service: Endoscopy;;  . Ercp N/A 07/24/2012    JXB:[id number] duodenal diverticulum/s/p dilation sphincterotomy site s/p basket stone extraction s/p cholangioscopy with holmium lithotripsy of common duct stones  . Spyglass cholangioscopy N/A 07/24/2012    Procedure: VHQIONGE CHOLANGIOSCOPY;  Surgeon: Corbin Ade, MD;  Location: [facility];  Service: Endoscopy;  Laterality: N/A;  . Spyglass lithotripsy N/A 07/24/2012    Procedure: XBMWUXLK LITHOTRIPSY;  Surgeon: Corbin Ade, MD;  Location: [facility];  Service: Endoscopy;  Laterality: N/A;  . Removal of stones N/A 07/24/2012    Procedure: REMOVAL OF STONES;  Surgeon: Corbin Ade, MD;  Location: [facility];  Service: Endoscopy;  Laterality: N/A;  . Balloon dilation N/A 07/24/2012    Procedure: BALLOON DILATION; Balloon basket dredge and balloon dialate sphincterotomy;  Surgeon: Corbin Ade, MD;  Location: [facility];  Service: Endoscopy;  Laterality: N/A;  . Holmium laser application N/A 07/24/2012    Procedure: HOLMIUM LASER APPLICATION;  Surgeon: Corbin Ade, MD;  Location: [facility];  Service: Endoscopy;  Laterality: N/A;  Family History  Problem Relation Age of Onset  . Stroke Father   . CAD Mother   . Cancer Son     ?metastatic   History  Substance Use Topics  . Smoking status: Former Smoker -- 0.25 packs/day for 15 years    Types: Cigarettes    Quit date: 07/17/1988  . Smokeless tobacco: Not on file  . Alcohol Use: No   No OB history provided.  Review of Systems  Constitutional: Negative for fever.  Gastrointestinal: Positive for nausea and abdominal pain. Negative  for vomiting and abdominal distention.  Genitourinary: Negative for difficulty urinating.  All other systems reviewed and are negative.    Allergies  Review of patient's allergies indicates no known allergies.  Home Medications   Current Outpatient Rx  Name  Route  Sig  Dispense  Refill  . allopurinol (ZYLOPRIM) 100 MG tablet   Oral   Take 100 mg by mouth daily.         . Calcium Carbonate-Vit D-Min (CALTRATE 600+D PLUS MINERALS) 600-800 MG-UNIT TABS   Oral   Take 1 tablet by mouth 2 (two) times daily.         . felodipine (PLENDIL) 10 MG 24 hr tablet   Oral   Take 10 mg by mouth daily.         . fentaNYL (DURAGESIC - DOSED MCG/HR) 100 MCG/HR   Transdermal   Place 1 patch onto the skin every 3 (three) days.         . Ibuprofen-Diphenhydramine Cit (ADVIL PM) 200-38 MG TABS   Oral   Take 1 tablet by mouth at bedtime as needed (sleep/pain).         . ondansetron (ZOFRAN) 4 MG tablet      TAKE ONE TABLET BY MOUTH EVERY 6 HOURS AS NEEDED FOR NAUSEA.   20 tablet   0   . potassium chloride SA (K-DUR,KLOR-CON) 20 MEQ tablet   Oral   Take 40 mEq by mouth 2 (two) times daily.         Marland Kitchen torsemide (DEMADEX) 20 MG tablet   Oral   Take 20 mg by mouth daily.         . traMADol-acetaminophen (ULTRACET) 37.5-325 MG per tablet   Oral   Take 1 tablet by mouth 3 (three) times daily as needed for pain.          Triage vitals: BP 137/65  Pulse 93  Temp(Src) 98.5 F (36.9 C) (Oral)  Resp 18  SpO2 97%  Physical Exam  Nursing note and vitals reviewed. Constitutional: She is oriented to person, place, and time. She appears well-developed and well-nourished. No distress.  HENT:  Head: Normocephalic and atraumatic.  Eyes: Conjunctivae and EOM are normal.  Neck: Normal range of motion. Neck supple.  Cardiovascular: Normal rate, regular rhythm and normal heart sounds.  Exam reveals no gallop and no friction rub.   No murmur heard. Pulmonary/Chest: Effort normal  and breath sounds normal. No respiratory distress.  Abdominal: Soft. She exhibits no distension. There is tenderness. There is guarding. There is no rebound.  RUQ tenderness. Voluntary guarding.  Musculoskeletal: Normal range of motion. She exhibits no edema.  Right flank tenderness.   Neurological: She is alert and oriented to person, place, and time.  Skin: Skin is warm and dry.  Psychiatric: She has a normal mood and affect. Her behavior is normal. Thought content normal.    ED Course  Procedures (including critical care time)  DIAGNOSTIC  STUDIES: Oxygen Saturation is 97% on room air, adequate by my interpretation.    COORDINATION OF CARE: 8:06 AM-Discussed treatment plan which includes CBC, ultrasound,and pain medication with pt at bedside and pt agreed to plan.   11:18AM-Consult complete with Dr. Darrick Penna, GI Patient case explained and discussed. Dr. Darrick Penna is requesting additional imaging and recommends admission under hospitalist. Call ended at 11:21 AM.  Labs Review Labs Reviewed - No data to display Imaging Review No results found.  EKG Interpretation   None       MDM   1. RUQ pain   2. Abnormal LFTs   3. Acute pancreatitis   4. Hepatic steatosis   5. Hyperbilirubinemia      I personally preformed the services scribed in my presence. The recorded information has been reviewed is accurate. [name], MD.   [name], MD 02/18/13 2114

## 2013-02-15 LAB — HEPATIC FUNCTION PANEL
ALT: 300 U/L — ABNORMAL HIGH (ref 0–35)
AST: 148 U/L — ABNORMAL HIGH (ref 0–37)
Bilirubin, Direct: 0.6 mg/dL — ABNORMAL HIGH (ref 0.0–0.3)
Indirect Bilirubin: 0.6 mg/dL (ref 0.3–0.9)
Total Protein: 6.5 g/dL (ref 6.0–8.3)

## 2013-02-15 LAB — CBC
Hemoglobin: 13.2 g/dL (ref 12.0–15.0)
MCH: 29.3 pg (ref 26.0–34.0)
Platelets: 248 10*3/uL (ref 150–400)
RBC: 4.51 MIL/uL (ref 3.87–5.11)
RDW: 15.3 % (ref 11.5–15.5)
WBC: 10.2 10*3/uL (ref 4.0–10.5)

## 2013-02-15 LAB — BASIC METABOLIC PANEL
Calcium: 9.2 mg/dL (ref 8.4–10.5)
GFR calc Af Amer: 66 mL/min — ABNORMAL LOW (ref >90)
GFR calc non Af Amer: 57 mL/min — ABNORMAL LOW (ref >90)
Glucose, Bld: 79 mg/dL (ref 70–99)
Potassium: 4 mEq/L (ref 3.5–5.1)
Sodium: 142 mEq/L (ref 135–145)

## 2013-02-15 MED ORDER — PANTOPRAZOLE SODIUM 40 MG IV SOLR
40.0000 mg | Freq: Every day | INTRAVENOUS | Status: DC
  Administered 2013-02-15 – 2013-02-19 (×5): 40 mg via INTRAVENOUS
  Filled 2013-02-15 (×5): qty 40

## 2013-02-15 NOTE — Progress Notes (Signed)
Subjective: Since I last evaluated the patient HE CONTINUES WITH RUQ PAIN OFF AND ON. PAIN CONTROLLED WITH MEDS. NO NAUSEA OR VOMITING. FEELS HUNGRY.  Objective: Vital signs in last 24 hours: Temp:  [97.6 F (36.4 C)-98.8 F (37.1 C)] 97.6 F (36.4 C) (12/14 0505) Pulse Rate:  [61-93] 61 (12/14 0505) Resp:  [16-18] 16 (12/14 0505) BP: (107-145)/(47-73) 107/50 mmHg (12/14 0505) SpO2:  [94 %-100 %] 94 % (12/14 0505) Weight:  [138 lb (62.596 kg)-139 lb 8 oz (63.277 kg)] 139 lb 8 oz (63.277 kg) (12/14 0500) Last BM Date: 02/13/13  Intake/Output from previous day: 12/13 0701 - 12/14 0700 In: 1465 [I.V.:1465] Out: 400 [Urine:400] Intake/Output this shift: Total I/O In: 1465 [I.V.:1465] Out: 400 [Urine:400]  General appearance: alert, cooperative and no distress Resp: clear to auscultation bilaterally Cardio: regular rate and rhythm GI: soft, MILD TO MOD TTP IN EPIGASTRIUM AND RUQ WITHOUT REBOUND OR GURADING; bowel sounds normal;   Lab Results:  Recent Labs  02/14/13 0808 02/15/13 0638  WBC 15.2* 10.2  HGB 14.8 13.2  HCT 43.6 40.0  PLT 243 248   BMET  Recent Labs  02/14/13 0808 02/15/13 0638  NA 140 142  K 3.8 4.0  CL 96 105  CO2 31 29  GLUCOSE 94 79  BUN 11 8  CREATININE 0.91 0.87  CALCIUM 10.8* 9.2   LFT  Recent Labs  02/15/13 0638  PROT 6.5  ALBUMIN 3.0*  AST 148*  ALT 300*  ALKPHOS 168*  BILITOT 1.2  BILIDIR 0.6*  IBILI 0.6   Studies/Results: Ct Abdomen W Contrast  02/14/2013   CLINICAL DATA:  Abdominal pain, elevated liver enzymes  EXAM: CT ABDOMEN WITH CONTRAST  TECHNIQUE: Multidetector CT imaging of the abdomen was performed using the standard protocol following bolus administration of intravenous contrast.  CONTRAST:  50mL OMNIPAQUE IOHEXOL 300 MG/ML SOLN, 80mL OMNIPAQUE IOHEXOL 300 MG/ML SOLN  COMPARISON:  07/09/2012  FINDINGS: Ill-defined area of increased density projects in the left lung base primarily within the region of the medial basal  segment. Hypoventilation is appreciated within the right lung base.  The liver demonstrates a diffuse low attenuating architecture as well as a very fine reticular pattern. No focal regions of abnormal attenuation or enhancement appreciated. Stable intra and extrahepatic biliary ductal dilatation is identified.  Evaluation of the pancreas demonstrates an area of heterogeneous enhancement within the uncinate process and hand. Mild inflammatory changes appreciated within the a tail region of the pancreas as well as areas of loss of the normal undulating border. No a focal loculated fluid collections are identified associated with the pancreas.  The adrenals, kidneys, and spleen are unremarkable.  There is no evidence of bowel obstruction nor secondary signs reflecting enteritis, colitis or diverticulitis within the visualized portions of the bowel. A small fat containing ventral wall hernia is identified measuring 2.3 x 2.2 cm stable.  There is no evidence of abdominal aortic aneurysm. Atherosclerotic calcifications identified within the aorta.  IMPRESSION:    : IMPRESSION:      *Atelectasis within the left lung base and infiltrate cannot be excluded if clinically appropriate *Findings raise suspicion of early or mild pancreatitis correlation with pancreatic function tests is recommended *Hepatic steatosis as well as findings which may reflect a underlying component of cirrhotic changes within the liver. *Stable intra and extrahepatic biliary ductal dilatation *Atherosclerotic changes within the aorta   Electronically Signed   By: Salome Holmes M.D.   On: 02/14/2013 13:32   US Abdomen Limited  Ruq  02/14/2013   CLINICAL DATA:  Right upper quadrant pain  EXAM: US ABDOMEN LIMITED - RIGHT UPPER QUADRANT  COMPARISON:  05/23/2012  FINDINGS: Gallbladder  Surgically removed.  Common bile duct  Diameter: 8.1 mm. Mild intrahepatic ductal dilatation is noted as well consistent with the post cholecystectomy state.  Liver:   Mild biliary ductal dilatation is noted. No focal mass lesion is seen.  IMPRESSION: Status post cholecystectomy.  No acute abnormality is noted.   Electronically Signed   By: Alcide Clever M.D.   On: 02/14/2013 10:12    Medications: I have reviewed the patient's current medications.  Assessment/Plan: ADMITTED WITH RUQ PAIN/ELEVATED LIVER ENZYMES.  CLINICALLY IMPROVED. PT HAS A ROD IN HER R FEMUR.  PLAN: 1. ADVANCE DIET 2. MRCP DEC 15 3. DAILY PPI 4. CONTINUE UNASYN   LOS: 1 day   [facility] 02/15/2013, 6:33 AM

## 2013-02-15 NOTE — Progress Notes (Signed)
Subjective: She was admitted with pancreatitis and abdominal pain. She has a known history of cholelithiasis and has had a cholecystectomy. However she has continued to make stones and has required ERCP and stenting in the past. She says her pain is better. She's not nauseated.  Objective: Vital signs in last 24 hours: Temp:  [97.6 F (36.4 C)-98.8 F (37.1 C)] 97.6 F (36.4 C) (12/14 0505) Pulse Rate:  [61-79] 61 (12/14 0505) Resp:  [16-18] 16 (12/14 0505) BP: (107-145)/(47-73) 107/50 mmHg (12/14 0505) SpO2:  [94 %-100 %] 94 % (12/14 0505) Weight:  [62.596 kg (138 lb)-63.277 kg (139 lb 8 oz)] 63.277 kg (139 lb 8 oz) (12/14 0500) Weight change:  Last BM Date: 02/13/13  Intake/Output from previous day: 12/13 0701 - 12/14 0700 In: 1465 [I.V.:1465] Out: 400 [Urine:400]  PHYSICAL EXAM General appearance: alert, cooperative and mild distress Resp: clear to auscultation bilaterally Cardio: regular rate and rhythm, S1, S2 normal, no murmur, click, rub or gallop GI: She is minimally tender in the right upper quadrant Extremities: extremities normal, atraumatic, no cyanosis or edema  Lab Results:    Basic Metabolic Panel:  Recent Labs  13/08/65 0808 02/15/13 0638  NA 140 142  K 3.8 4.0  CL 96 105  CO2 31 29  GLUCOSE 94 79  BUN 11 8  CREATININE 0.91 0.87  CALCIUM 10.8* 9.2   Liver Function Tests:  Recent Labs  02/14/13 0808 02/15/13 0638  AST 354* 148*  ALT 493* 300*  ALKPHOS 217* 168*  BILITOT 2.9* 1.2  PROT 8.2 6.5  ALBUMIN 4.0 3.0*    Recent Labs  02/14/13 1125 02/15/13 0638  LIPASE 25 35   No results found for this basename: AMMONIA,  in the last 72 hours CBC:  Recent Labs  02/14/13 0808 02/15/13 0638  WBC 15.2* 10.2  NEUTROABS 12.6*  --   HGB 14.8 13.2  HCT 43.6 40.0  MCV 87.0 88.7  PLT 243 248   Cardiac Enzymes: No results found for this basename: CKTOTAL, CKMB, CKMBINDEX, TROPONINI,  in the last 72 hours BNP: No results found for this  basename: PROBNP,  in the last 72 hours D-Dimer: No results found for this basename: DDIMER,  in the last 72 hours CBG: No results found for this basename: GLUCAP,  in the last 72 hours Hemoglobin A1C: No results found for this basename: HGBA1C,  in the last 72 hours Fasting Lipid Panel: No results found for this basename: CHOL, HDL, LDLCALC, TRIG, CHOLHDL, LDLDIRECT,  in the last 72 hours Thyroid Function Tests: No results found for this basename: TSH, T4TOTAL, FREET4, T3FREE, THYROIDAB,  in the last 72 hours Anemia Panel: No results found for this basename: VITAMINB12, FOLATE, FERRITIN, TIBC, IRON, RETICCTPCT,  in the last 72 hours Coagulation: No results found for this basename: LABPROT, INR,  in the last 72 hours Urine Drug Screen: Drugs of Abuse     Component Value Date/Time   LABOPIA NEG 11/01/2008 2345   LABOPIA POS* 04/29/2008 0236   COCAINSCRNUR NEG 11/01/2008 2345   LABBENZ NEG 11/01/2008 2345   AMPHETMU NEG 11/01/2008 2345    Alcohol Level: No results found for this basename: ETH,  in the last 72 hours Urinalysis:  Recent Labs  02/14/13 0922  COLORURINE YELLOW  LABSPEC 1.015  PHURINE 8.0  GLUCOSEU NEGATIVE  HGBUR NEGATIVE  BILIRUBINUR NEGATIVE  KETONESUR NEGATIVE  PROTEINUR NEGATIVE  UROBILINOGEN 1.0  NITRITE NEGATIVE  LEUKOCYTESUR NEGATIVE   Misc. Labs:  ABGS No results found  for this basename: PHART, PCO2, PO2ART, TCO2, HCO3,  in the last 72 hours CULTURES No results found for this or any previous visit (from the past 240 hour(s)). Studies/Results: Ct Abdomen W Contrast  02/14/2013   CLINICAL DATA:  Abdominal pain, elevated liver enzymes  EXAM: CT ABDOMEN WITH CONTRAST  TECHNIQUE: Multidetector CT imaging of the abdomen was performed using the standard protocol following bolus administration of intravenous contrast.  CONTRAST:  50mL OMNIPAQUE IOHEXOL 300 MG/ML SOLN, 80mL OMNIPAQUE IOHEXOL 300 MG/ML SOLN  COMPARISON:  07/09/2012  FINDINGS: Ill-defined area  of increased density projects in the left lung base primarily within the region of the medial basal segment. Hypoventilation is appreciated within the right lung base.  The liver demonstrates a diffuse low attenuating architecture as well as a very fine reticular pattern. No focal regions of abnormal attenuation or enhancement appreciated. Stable intra and extrahepatic biliary ductal dilatation is identified.  Evaluation of the pancreas demonstrates an area of heterogeneous enhancement within the uncinate process and hand. Mild inflammatory changes appreciated within the a tail region of the pancreas as well as areas of loss of the normal undulating border. No a focal loculated fluid collections are identified associated with the pancreas.  The adrenals, kidneys, and spleen are unremarkable.  There is no evidence of bowel obstruction nor secondary signs reflecting enteritis, colitis or diverticulitis within the visualized portions of the bowel. A small fat containing ventral wall hernia is identified measuring 2.3 x 2.2 cm stable.  There is no evidence of abdominal aortic aneurysm. Atherosclerotic calcifications identified within the aorta.  IMPRESSION:    : IMPRESSION:      *Atelectasis within the left lung base and infiltrate cannot be excluded if clinically appropriate *Findings raise suspicion of early or mild pancreatitis correlation with pancreatic function tests is recommended *Hepatic steatosis as well as findings which may reflect a underlying component of cirrhotic changes within the liver. *Stable intra and extrahepatic biliary ductal dilatation *Atherosclerotic changes within the aorta   Electronically Signed   By: Salome Holmes M.D.   On: 02/14/2013 13:32   US Abdomen Limited Ruq  02/14/2013   CLINICAL DATA:  Right upper quadrant pain  EXAM: US ABDOMEN LIMITED - RIGHT UPPER QUADRANT  COMPARISON:  05/23/2012  FINDINGS: Gallbladder  Surgically removed.  Common bile duct  Diameter: 8.1 mm. Mild  intrahepatic ductal dilatation is noted as well consistent with the post cholecystectomy state.  Liver:  Mild biliary ductal dilatation is noted. No focal mass lesion is seen.  IMPRESSION: Status post cholecystectomy.  No acute abnormality is noted.   Electronically Signed   By: Alcide Clever M.D.   On: 02/14/2013 10:12    Medications:  Prior to Admission:  Prescriptions prior to admission  Medication Sig Dispense Refill  . allopurinol (ZYLOPRIM) 100 MG tablet Take 100 mg by mouth daily.      . Calcium Carbonate-Vit D-Min (CALTRATE 600+D PLUS MINERALS) 600-800 MG-UNIT TABS Take 1 tablet by mouth 2 (two) times daily.      . felodipine (PLENDIL) 10 MG 24 hr tablet Take 10 mg by mouth daily.      . fentaNYL (DURAGESIC - DOSED MCG/HR) 100 MCG/HR Place 1 patch onto the skin every 3 (three) days.      . Ibuprofen-Diphenhydramine Cit (ADVIL PM) 200-38 MG TABS Take 1 tablet by mouth at bedtime as needed (sleep/pain).      . polyethylene glycol (MIRALAX / GLYCOLAX) packet Take 17 g by mouth daily as needed for  moderate constipation.      . potassium chloride SA (K-DUR,KLOR-CON) 20 MEQ tablet Take 40 mEq by mouth 2 (two) times daily.      Marland Kitchen torsemide (DEMADEX) 20 MG tablet Take 20 mg by mouth daily.      . traMADol-acetaminophen (ULTRACET) 37.5-325 MG per tablet Take 1 tablet by mouth 3 (three) times daily as needed for pain.       Scheduled: . allopurinol  100 mg Oral Daily  . ampicillin-sulbactam (UNASYN) IV  3 g Intravenous Q8H  . [START ON 02/16/2013] fentaNYL  100 mcg Transdermal Q72H  . heparin  5,000 Units Subcutaneous Q8H  . pantoprazole (PROTONIX) IV  40 mg Intravenous QAC breakfast   Continuous: . 0.9 % NaCl with KCl 20 mEq / L 100 mL/hr at 02/15/13 0135   UJW:[id number], acetaminophen, albuterol, morphine injection, ondansetron (ZOFRAN) IV, ondansetron  Assesment: She was admitted with pancreatitis probably related to another stone. She says she feels better. She has no new  complaints. Principal Problem:   Acute pancreatitis Active Problems:   GOUT   HYPERTENSION   RUQ pain   Elevated LFTs   Hepatic steatosis   Hyperbilirubinemia   Leukocytosis   Osteoarthritis    Plan: Continue with current treatments as she seems to be improving. GI consultation has been requested    LOS: 1 day   [facility] Christine L 02/15/2013, 10:26 AM

## 2013-02-16 ENCOUNTER — Inpatient Hospital Stay (HOSPITAL_COMMUNITY): Payer: Medicare Other

## 2013-02-16 DIAGNOSIS — R109 Unspecified abdominal pain: Secondary | ICD-10-CM

## 2013-02-16 DIAGNOSIS — R748 Abnormal levels of other serum enzymes: Secondary | ICD-10-CM

## 2013-02-16 LAB — COMPREHENSIVE METABOLIC PANEL
ALT: 184 U/L — ABNORMAL HIGH (ref 0–35)
Albumin: 2.5 g/dL — ABNORMAL LOW (ref 3.5–5.2)
Alkaline Phosphatase: 137 U/L — ABNORMAL HIGH (ref 39–117)
BUN: 7 mg/dL (ref 6–23)
CO2: 28 mEq/L (ref 19–32)
Calcium: 8.8 mg/dL (ref 8.4–10.5)
GFR calc Af Amer: 84 mL/min — ABNORMAL LOW (ref >90)
GFR calc non Af Amer: 72 mL/min — ABNORMAL LOW (ref >90)
Glucose, Bld: 105 mg/dL — ABNORMAL HIGH (ref 70–99)
Potassium: 3.8 mEq/L (ref 3.5–5.1)
Total Bilirubin: 0.5 mg/dL (ref 0.3–1.2)
Total Protein: 5.8 g/dL — ABNORMAL LOW (ref 6.0–8.3)

## 2013-02-16 LAB — CBC WITH DIFFERENTIAL/PLATELET
Basophils Absolute: 0 10*3/uL (ref 0.0–0.1)
Basophils Relative: 0 % (ref 0–1)
HCT: 35.4 % — ABNORMAL LOW (ref 36.0–46.0)
Hemoglobin: 11.9 g/dL — ABNORMAL LOW (ref 12.0–15.0)
Lymphs Abs: 1.8 10*3/uL (ref 0.7–4.0)
MCH: 29.4 pg (ref 26.0–34.0)
MCHC: 33.6 g/dL (ref 30.0–36.0)
MCV: 87.4 fL (ref 78.0–100.0)
Monocytes Absolute: 1.1 10*3/uL — ABNORMAL HIGH (ref 0.1–1.0)
Monocytes Relative: 12 % (ref 3–12)
Neutro Abs: 6.5 10*3/uL (ref 1.7–7.7)

## 2013-02-16 LAB — SURGICAL PCR SCREEN
MRSA, PCR: NEGATIVE
Staphylococcus aureus: NEGATIVE

## 2013-02-16 MED ORDER — GADOBENATE DIMEGLUMINE 529 MG/ML IV SOLN
13.0000 mL | Freq: Once | INTRAVENOUS | Status: AC | PRN
  Administered 2013-02-16: 13 mL via INTRAVENOUS

## 2013-02-16 NOTE — Progress Notes (Signed)
Subjective: She says she feels better. She continues to have episodes of pain and nausea that are somewhat unpredictable. She says she feels okay now.  Objective: Vital signs in last 24 hours: Temp:  [98.3 F (36.8 C)-98.5 F (36.9 C)] 98.3 F (36.8 C) (12/15 0600) Pulse Rate:  [68-73] 68 (12/15 0600) Resp:  [17] 17 (12/15 0600) BP: (117-135)/(55-74) 135/55 mmHg (12/15 0600) SpO2:  [92 %-96 %] 96 % (12/15 0600) Weight:  [65.363 kg (144 lb 1.6 oz)] 65.363 kg (144 lb 1.6 oz) (12/15 0500) Weight change: 2.767 kg (6 lb 1.6 oz) Last BM Date: 02/14/13  Intake/Output from previous day: 12/14 0701 - 12/15 0700 In: 1905 [P.O.:660; I.V.:1245] Out: 700 [Urine:700]  PHYSICAL EXAM General appearance: alert, cooperative and no distress Resp: clear to auscultation bilaterally Cardio: regular rate and rhythm, S1, S2 normal, no murmur, click, rub or gallop GI: Mildly tender more in the right upper quadrant Extremities: extremities normal, atraumatic, no cyanosis or edema  Lab Results:    Basic Metabolic Panel:  Recent Labs  16/10/96 0638 02/16/13 0528  NA 142 142  K 4.0 3.8  CL 105 109  CO2 29 28  GLUCOSE 79 105*  BUN 8 7  CREATININE 0.87 0.76  CALCIUM 9.2 8.8   Liver Function Tests:  Recent Labs  02/15/13 0638 02/16/13 0528  AST 148* 63*  ALT 300* 184*  ALKPHOS 168* 137*  BILITOT 1.2 0.5  PROT 6.5 5.8*  ALBUMIN 3.0* 2.5*    Recent Labs  02/14/13 1125 02/15/13 0638  LIPASE 25 35   No results found for this basename: AMMONIA,  in the last 72 hours CBC:  Recent Labs  02/14/13 0808 02/15/13 0638 02/16/13 0528  WBC 15.2* 10.2 9.7  NEUTROABS 12.6*  --  6.5  HGB 14.8 13.2 11.9*  HCT 43.6 40.0 35.4*  MCV 87.0 88.7 87.4  PLT 243 248 219   Cardiac Enzymes: No results found for this basename: CKTOTAL, CKMB, CKMBINDEX, TROPONINI,  in the last 72 hours BNP: No results found for this basename: PROBNP,  in the last 72 hours D-Dimer: No results found for this  basename: DDIMER,  in the last 72 hours CBG: No results found for this basename: GLUCAP,  in the last 72 hours Hemoglobin A1C: No results found for this basename: HGBA1C,  in the last 72 hours Fasting Lipid Panel: No results found for this basename: CHOL, HDL, LDLCALC, TRIG, CHOLHDL, LDLDIRECT,  in the last 72 hours Thyroid Function Tests: No results found for this basename: TSH, T4TOTAL, FREET4, T3FREE, THYROIDAB,  in the last 72 hours Anemia Panel: No results found for this basename: VITAMINB12, FOLATE, FERRITIN, TIBC, IRON, RETICCTPCT,  in the last 72 hours Coagulation: No results found for this basename: LABPROT, INR,  in the last 72 hours Urine Drug Screen: Drugs of Abuse     Component Value Date/Time   LABOPIA NEG 11/01/2008 2345   LABOPIA POS* 04/29/2008 0236   COCAINSCRNUR NEG 11/01/2008 2345   LABBENZ NEG 11/01/2008 2345   AMPHETMU NEG 11/01/2008 2345    Alcohol Level: No results found for this basename: ETH,  in the last 72 hours Urinalysis:  Recent Labs  02/14/13 0922  COLORURINE YELLOW  LABSPEC 1.015  PHURINE 8.0  GLUCOSEU NEGATIVE  HGBUR NEGATIVE  BILIRUBINUR NEGATIVE  KETONESUR NEGATIVE  PROTEINUR NEGATIVE  UROBILINOGEN 1.0  NITRITE NEGATIVE  LEUKOCYTESUR NEGATIVE   Misc. Labs:  ABGS No results found for this basename: PHART, PCO2, PO2ART, TCO2, HCO3,  in the  last 72 hours CULTURES No results found for this or any previous visit (from the past 240 hour(s)). Studies/Results: Ct Abdomen W Contrast  02/14/2013   CLINICAL DATA:  Abdominal pain, elevated liver enzymes  EXAM: CT ABDOMEN WITH CONTRAST  TECHNIQUE: Multidetector CT imaging of the abdomen was performed using the standard protocol following bolus administration of intravenous contrast.  CONTRAST:  50mL OMNIPAQUE IOHEXOL 300 MG/ML SOLN, 80mL OMNIPAQUE IOHEXOL 300 MG/ML SOLN  COMPARISON:  07/09/2012  FINDINGS: Ill-defined area of increased density projects in the left lung base primarily within the  region of the medial basal segment. Hypoventilation is appreciated within the right lung base.  The liver demonstrates a diffuse low attenuating architecture as well as a very fine reticular pattern. No focal regions of abnormal attenuation or enhancement appreciated. Stable intra and extrahepatic biliary ductal dilatation is identified.  Evaluation of the pancreas demonstrates an area of heterogeneous enhancement within the uncinate process and hand. Mild inflammatory changes appreciated within the a tail region of the pancreas as well as areas of loss of the normal undulating border. No a focal loculated fluid collections are identified associated with the pancreas.  The adrenals, kidneys, and spleen are unremarkable.  There is no evidence of bowel obstruction nor secondary signs reflecting enteritis, colitis or diverticulitis within the visualized portions of the bowel. A small fat containing ventral wall hernia is identified measuring 2.3 x 2.2 cm stable.  There is no evidence of abdominal aortic aneurysm. Atherosclerotic calcifications identified within the aorta.  IMPRESSION:    : IMPRESSION:      *Atelectasis within the left lung base and infiltrate cannot be excluded if clinically appropriate *Findings raise suspicion of early or mild pancreatitis correlation with pancreatic function tests is recommended *Hepatic steatosis as well as findings which may reflect a underlying component of cirrhotic changes within the liver. *Stable intra and extrahepatic biliary ductal dilatation *Atherosclerotic changes within the aorta   Electronically Signed   By: Salome Holmes M.D.   On: 02/14/2013 13:32   US Abdomen Limited Ruq  02/14/2013   CLINICAL DATA:  Right upper quadrant pain  EXAM: US ABDOMEN LIMITED - RIGHT UPPER QUADRANT  COMPARISON:  05/23/2012  FINDINGS: Gallbladder  Surgically removed.  Common bile duct  Diameter: 8.1 mm. Mild intrahepatic ductal dilatation is noted as well consistent with the post  cholecystectomy state.  Liver:  Mild biliary ductal dilatation is noted. No focal mass lesion is seen.  IMPRESSION: Status post cholecystectomy.  No acute abnormality is noted.   Electronically Signed   By: Alcide Clever M.D.   On: 02/14/2013 10:12    Medications:  Prior to Admission:  Prescriptions prior to admission  Medication Sig Dispense Refill  . allopurinol (ZYLOPRIM) 100 MG tablet Take 100 mg by mouth daily.      . Calcium Carbonate-Vit D-Min (CALTRATE 600+D PLUS MINERALS) 600-800 MG-UNIT TABS Take 1 tablet by mouth 2 (two) times daily.      . felodipine (PLENDIL) 10 MG 24 hr tablet Take 10 mg by mouth daily.      . fentaNYL (DURAGESIC - DOSED MCG/HR) 100 MCG/HR Place 1 patch onto the skin every 3 (three) days.      . Ibuprofen-Diphenhydramine Cit (ADVIL PM) 200-38 MG TABS Take 1 tablet by mouth at bedtime as needed (sleep/pain).      . polyethylene glycol (MIRALAX / GLYCOLAX) packet Take 17 g by mouth daily as needed for moderate constipation.      . potassium chloride SA (  K-DUR,KLOR-CON) 20 MEQ tablet Take 40 mEq by mouth 2 (two) times daily.      Marland Kitchen torsemide (DEMADEX) 20 MG tablet Take 20 mg by mouth daily.      . traMADol-acetaminophen (ULTRACET) 37.5-325 MG per tablet Take 1 tablet by mouth 3 (three) times daily as needed for pain.       Scheduled: . allopurinol  100 mg Oral Daily  . ampicillin-sulbactam (UNASYN) IV  3 g Intravenous Q8H  . fentaNYL  100 mcg Transdermal Q72H  . heparin  5,000 Units Subcutaneous Q8H  . pantoprazole (PROTONIX) IV  40 mg Intravenous QAC breakfast   Continuous: . 0.9 % NaCl with KCl 20 mEq / L 100 mL/hr at 02/16/13 0300   UJW:JXBJYNWGNFAOZ, acetaminophen, albuterol, morphine injection, ondansetron (ZOFRAN) IV, ondansetron  Assesment: She has a history of stones in the common bile duct. She has had a cholecystectomy. She had mild acute pancreatitis based on CT scan. She has elevated liver function testing. She is improving but still having  symptoms. Principal Problem:   Acute pancreatitis Active Problems:   GOUT   HYPERTENSION   RUQ pain   Elevated LFTs   Hepatic steatosis   Hyperbilirubinemia   Leukocytosis   Osteoarthritis    Plan: GI consultation and apparently they planned MRCP today    LOS: 2 days   Mckade Gurka L 02/16/2013, 8:56 AM

## 2013-02-16 NOTE — Progress Notes (Signed)
Utilization Review Complete  

## 2013-02-16 NOTE — Progress Notes (Addendum)
Subjective: Improved pain since admission, waxes and wanes without any real exacerbating factors. RUQ. Mild nausea but no vomiting. Some waves of nausea "at times". RUQ pain radiates around to scapula and "up my back".  Objective: Vital signs in last 24 hours: Temp:  [98.3 F (36.8 C)-98.5 F (36.9 C)] 98.3 F (36.8 C) (12/15 0600) Pulse Rate:  [68-73] 68 (12/15 0600) Resp:  [17] 17 (12/15 0600) BP: (117-135)/(55-74) 135/55 mmHg (12/15 0600) SpO2:  [92 %-96 %] 96 % (12/15 0600) Weight:  [144 lb 1.6 oz (65.363 kg)] 144 lb 1.6 oz (65.363 kg) (12/15 0500) Last BM Date: 02/14/13 General:   Alert and oriented, pleasant Head:  Normocephalic and atraumatic. Eyes:  No icterus, sclera clear. Conjuctiva pink.  Heart:  S1, S2 present, no murmurs noted.  Lungs: Clear to auscultation bilaterally, without wheezing, rales, or rhonchi.  Abdomen:  Bowel sounds present, soft, mild TTP RUQ non-distended. No HSM or hernias noted.  Extremities:  Without clubbing or edema. Neurologic:  Alert and  oriented x4;  grossly normal neurologically. Psych:  Alert and cooperative. Normal mood and affect.  Intake/Output from previous day: 12/14 0701 - 12/15 0700 In: 1905 [P.O.:660; I.V.:1245] Out: 700 [Urine:700] Intake/Output this shift:    Lab Results:  Recent Labs  02/14/13 0808 02/15/13 0638 02/16/13 0528  WBC 15.2* 10.2 9.7  HGB 14.8 13.2 11.9*  HCT 43.6 40.0 35.4*  PLT 243 248 219   BMET  Recent Labs  02/14/13 0808 02/15/13 0638 02/16/13 0528  NA 140 142 142  K 3.8 4.0 3.8  CL 96 105 109  CO2 31 29 28   GLUCOSE 94 79 105*  BUN 11 8 7   CREATININE 0.91 0.87 0.76  CALCIUM 10.8* 9.2 8.8   LFT  Recent Labs  02/14/13 0808 02/14/13 1126 02/15/13 0638 02/16/13 0528  PROT 8.2  --  6.5 5.8*  ALBUMIN 4.0  --  3.0* 2.5*  AST 354*  --  148* 63*  ALT 493*  --  300* 184*  ALKPHOS 217*  --  168* 137*  BILITOT 2.9*  --  1.2 0.5  BILIDIR  --  2.1* 0.6*  --   IBILI  --   --  0.6  --     Lab Results  Component Value Date   LIPASE 35 02/15/2013     Studies/Results: Ct Abdomen W Contrast  02/14/2013   CLINICAL DATA:  Abdominal pain, elevated liver enzymes  EXAM: CT ABDOMEN WITH CONTRAST  TECHNIQUE: Multidetector CT imaging of the abdomen was performed using the standard protocol following bolus administration of intravenous contrast.  CONTRAST:  50mL OMNIPAQUE IOHEXOL 300 MG/ML SOLN, 80mL OMNIPAQUE IOHEXOL 300 MG/ML SOLN  COMPARISON:  07/09/2012  FINDINGS: Ill-defined area of increased density projects in the left lung base primarily within the region of the medial basal segment. Hypoventilation is appreciated within the right lung base.  The liver demonstrates a diffuse low attenuating architecture as well as a very fine reticular pattern. No focal regions of abnormal attenuation or enhancement appreciated. Stable intra and extrahepatic biliary ductal dilatation is identified.  Evaluation of the pancreas demonstrates an area of heterogeneous enhancement within the uncinate process and hand. Mild inflammatory changes appreciated within the a tail region of the pancreas as well as areas of loss of the normal undulating border. No a focal loculated fluid collections are identified associated with the pancreas.  The adrenals, kidneys, and spleen are unremarkable.  There is no evidence of bowel obstruction nor secondary signs reflecting enteritis,  colitis or diverticulitis within the visualized portions of the bowel. A small fat containing ventral wall hernia is identified measuring 2.3 x 2.2 cm stable.  There is no evidence of abdominal aortic aneurysm. Atherosclerotic calcifications identified within the aorta.  IMPRESSION:    : IMPRESSION:      *Atelectasis within the left lung base and infiltrate cannot be excluded if clinically appropriate *Findings raise suspicion of early or mild pancreatitis correlation with pancreatic function tests is recommended *Hepatic steatosis as well as  findings which may reflect a underlying component of cirrhotic changes within the liver. *Stable intra and extrahepatic biliary ductal dilatation *Atherosclerotic changes within the aorta   Electronically Signed   By: Salome Holmes M.D.   On: 02/14/2013 13:32   US Abdomen Limited Ruq  02/14/2013   CLINICAL DATA:  Right upper quadrant pain  EXAM: US ABDOMEN LIMITED - RIGHT UPPER QUADRANT  COMPARISON:  05/23/2012  FINDINGS: Gallbladder  Surgically removed.  Common bile duct  Diameter: 8.1 mm. Mild intrahepatic ductal dilatation is noted as well consistent with the post cholecystectomy state.  Liver:  Mild biliary ductal dilatation is noted. No focal mass lesion is seen.  IMPRESSION: Status post cholecystectomy.  No acute abnormality is noted.   Electronically Signed   By: Alcide Clever M.D.   On: 02/14/2013 10:12    Assessment: 79 year old female admitted with abdominal pain, elevated LFTs, leukocytosis, normal lipase. Possibility of early pancreatitis on CT. Past history significant for choledocholithiasis with ERCP, stent placement and subsequent removal. Overall clinically improved since admission. Needs MRCP today for further evaluation to assess for CBD stones, further assessment of pancreas. Discussed with MRI, who states rod in femur is not a contraindication.   Plan: Remain NPO MRCP today Continue PPI for GI prophylaxis Further recommendations after review of MRCP  Nira Retort, ANP-BC [facility]    LOS: 2 days    02/16/2013, 7:54 AM    Abdominal pain better this afternoon. Reviewed MRCP. She has 4 gallstones in a dilated bile duct. Recurrent common duct stones likely related to stasis and lithogenic bile. Based on MRI and  prior CT, stones likely not significantly calcified and, therefore, may be amenable to UCDA therapy.  However, adding Actigall to her regimen alone in this setting would be insufficient as it would take a long, long time for larger stones to  dissolute and would, consequently, leave her at risk for recurrent biliary pancreatitis and sepsis in the interim. In a younger person, I'd would have a lower threshold for entertaining a surgery consultation for a choledochojejunostomy. However, with her advanced age, would like to avoid major abdominal surgery if at all possible. After speaking with patient and her daughter, Ms. Lenis Noon at length about options,  I feel the most reasonable approach would be to go ahead and perform another ERCP with stone extraction and lithotripsy as feasible followed by beginning lifelong Actigall therapy in the hopes that she would not need any further intervention.  However, if she were to have future symptomatic common duct stones then I think we would need to talk to our surgical colleagues about a choledochojejunostomy. After discussing options, the patient and family desire to proceed with ERCP tomorrow.  Will avoid stent placement if at all possible. The risks, benefits, limitations, alternatives, and mponderable have been reviewed with the patient. I specifically discussed a1 in 10 chance of pancreatitis, reaction to medications, bleeding, perforation and the possibility of a failed ERCP. Potential for sphincterotomy and stent placement  also reviewed. Questions have been answered. All parties agreeable.

## 2013-02-17 ENCOUNTER — Inpatient Hospital Stay (HOSPITAL_COMMUNITY): Payer: Medicare Other

## 2013-02-17 ENCOUNTER — Encounter (HOSPITAL_COMMUNITY): Payer: Self-pay | Admitting: *Deleted

## 2013-02-17 ENCOUNTER — Encounter (HOSPITAL_COMMUNITY)
Admission: EM | Disposition: A | Discharge status: Home / Self Care | Payer: Self-pay | Source: Home / Self Care | Attending: Internal Medicine

## 2013-02-17 ENCOUNTER — Encounter (HOSPITAL_COMMUNITY): Payer: Medicare Other | Admitting: Anesthesiology

## 2013-02-17 ENCOUNTER — Inpatient Hospital Stay (HOSPITAL_COMMUNITY): Payer: Medicare Other | Admitting: Anesthesiology

## 2013-02-17 DIAGNOSIS — K8050 Calculus of bile duct without cholangitis or cholecystitis without obstruction: Secondary | ICD-10-CM

## 2013-02-17 HISTORY — PX: BALLOON DILATION: SHX5330

## 2013-02-17 HISTORY — PX: ERCP: SHX5425

## 2013-02-17 HISTORY — PX: STONE EXTRACTION WITH BASKET: SHX5318

## 2013-02-17 LAB — HEPATIC FUNCTION PANEL
ALT: 147 U/L — ABNORMAL HIGH (ref 0–35)
Alkaline Phosphatase: 135 U/L — ABNORMAL HIGH (ref 39–117)
Bilirubin, Direct: 0.2 mg/dL (ref 0.0–0.3)
Indirect Bilirubin: 0.4 mg/dL (ref 0.3–0.9)
Total Protein: 6.5 g/dL (ref 6.0–8.3)

## 2013-02-17 SURGERY — ENDOSCOPIC RETROGRADE CHOLANGIOPANCREATOGRAPHY (ERCP)
Anesthesia: General | Site: Esophagus

## 2013-02-17 MED ORDER — LACTATED RINGERS IV SOLN: INTRAVENOUS | Status: DC

## 2013-02-17 MED ORDER — SODIUM CHLORIDE 0.9 % IV SOLN
INTRAVENOUS | Status: AC
  Filled 2013-02-17: qty 50

## 2013-02-17 MED ORDER — FENTANYL CITRATE 0.05 MG/ML IJ SOLN
INTRAMUSCULAR | Status: AC
  Filled 2013-02-17: qty 2

## 2013-02-17 MED ORDER — EPHEDRINE SULFATE 50 MG/ML IJ SOLN: INTRAMUSCULAR | Status: DC | PRN

## 2013-02-17 MED ORDER — FENTANYL CITRATE 0.05 MG/ML IJ SOLN
INTRAMUSCULAR | Status: DC | PRN
  Administered 2013-02-17 (×2): 25 ug via INTRAVENOUS
  Administered 2013-02-17 (×2): 50 ug via INTRAVENOUS

## 2013-02-17 MED ORDER — PROPOFOL 10 MG/ML IV BOLUS
INTRAVENOUS | Status: DC | PRN
  Administered 2013-02-17: 100 mg via INTRAVENOUS

## 2013-02-17 MED ORDER — ONDANSETRON HCL 4 MG/2ML IJ SOLN
4.0000 mg | Freq: Once | INTRAMUSCULAR | Status: AC
  Administered 2013-02-17: 4 mg via INTRAVENOUS

## 2013-02-17 MED ORDER — ONDANSETRON HCL 4 MG/2ML IJ SOLN: 4.0000 mg | Freq: Once | INTRAMUSCULAR | Status: DC | PRN

## 2013-02-17 MED ORDER — LIDOCAINE HCL (CARDIAC) 20 MG/ML IV SOLN
INTRAVENOUS | Status: DC | PRN
  Administered 2013-02-17: 50 mg via INTRAVENOUS

## 2013-02-17 MED ORDER — ROCURONIUM BROMIDE 50 MG/5ML IV SOLN
INTRAVENOUS | Status: AC
  Filled 2013-02-17: qty 1

## 2013-02-17 MED ORDER — ETOMIDATE 2 MG/ML IV SOLN
INTRAVENOUS | Status: AC
  Filled 2013-02-17: qty 10

## 2013-02-17 MED ORDER — PROPOFOL 10 MG/ML IV BOLUS
INTRAVENOUS | Status: AC
  Filled 2013-02-17: qty 20

## 2013-02-17 MED ORDER — PHENYLEPHRINE HCL 10 MG/ML IJ SOLN
INTRAMUSCULAR | Status: DC | PRN
  Administered 2013-02-17: 100 ug via INTRAVENOUS

## 2013-02-17 MED ORDER — LIDOCAINE HCL (PF) 1 % IJ SOLN
INTRAMUSCULAR | Status: AC
  Filled 2013-02-17: qty 5

## 2013-02-17 MED ORDER — SUCCINYLCHOLINE CHLORIDE 20 MG/ML IJ SOLN
INTRAMUSCULAR | Status: AC
  Filled 2013-02-17: qty 1

## 2013-02-17 MED ORDER — NEOSTIGMINE METHYLSULFATE 1 MG/ML IJ SOLN
INTRAMUSCULAR | Status: AC
  Filled 2013-02-17: qty 1

## 2013-02-17 MED ORDER — GLUCAGON HCL (RDNA) 1 MG IJ SOLR
INTRAMUSCULAR | Status: AC
  Filled 2013-02-17: qty 2

## 2013-02-17 MED ORDER — MIDAZOLAM HCL 2 MG/2ML IJ SOLN
1.0000 mg | INTRAMUSCULAR | Status: DC | PRN
  Administered 2013-02-17: 2 mg via INTRAVENOUS

## 2013-02-17 MED ORDER — GLYCOPYRROLATE 0.2 MG/ML IJ SOLN
INTRAMUSCULAR | Status: DC | PRN
  Administered 2013-02-17: 0.6 mg via INTRAVENOUS

## 2013-02-17 MED ORDER — GLYCOPYRROLATE 0.2 MG/ML IJ SOLN
INTRAMUSCULAR | Status: AC
  Filled 2013-02-17: qty 1

## 2013-02-17 MED ORDER — ACETYLCYSTEINE 20 % IN SOLN
RESPIRATORY_TRACT | Status: AC
  Filled 2013-02-17: qty 4

## 2013-02-17 MED ORDER — MIDAZOLAM HCL 2 MG/2ML IJ SOLN
INTRAMUSCULAR | Status: AC
  Filled 2013-02-17: qty 2

## 2013-02-17 MED ORDER — MIDAZOLAM HCL 2 MG/2ML IJ SOLN: 1.0000 mg | INTRAMUSCULAR | Status: DC | PRN

## 2013-02-17 MED ORDER — SODIUM CHLORIDE BACTERIOSTATIC 0.9 % IJ SOLN
INTRAMUSCULAR | Status: AC
  Filled 2013-02-17: qty 30

## 2013-02-17 MED ORDER — FENTANYL CITRATE 0.05 MG/ML IJ SOLN: 25.0000 ug | INTRAMUSCULAR | Status: DC | PRN

## 2013-02-17 MED ORDER — LACTATED RINGERS IV SOLN
INTRAVENOUS | Status: DC
  Administered 2013-02-17: 10:00:00 via INTRAVENOUS

## 2013-02-17 MED ORDER — SODIUM CHLORIDE 0.9 % IV SOLN
INTRAVENOUS | Status: DC | PRN
  Administered 2013-02-17 (×3)

## 2013-02-17 MED ORDER — PHENOL 1.4 % MT LIQD
1.0000 | OROMUCOSAL | Status: DC | PRN
  Administered 2013-02-17: 1 via OROMUCOSAL
  Filled 2013-02-17: qty 177

## 2013-02-17 MED ORDER — GLYCOPYRROLATE 0.2 MG/ML IJ SOLN
0.2000 mg | Freq: Once | INTRAMUSCULAR | Status: AC
  Administered 2013-02-17: 0.2 mg via INTRAVENOUS

## 2013-02-17 MED ORDER — GLYCOPYRROLATE 0.2 MG/ML IJ SOLN
INTRAMUSCULAR | Status: AC
  Filled 2013-02-17: qty 3

## 2013-02-17 MED ORDER — SODIUM CHLORIDE 0.9 % IV SOLN: INTRAVENOUS | Status: DC

## 2013-02-17 MED ORDER — NEOSTIGMINE METHYLSULFATE 1 MG/ML IJ SOLN
INTRAMUSCULAR | Status: DC | PRN
  Administered 2013-02-17: 4 mg via INTRAVENOUS

## 2013-02-17 MED ORDER — ROCURONIUM BROMIDE 100 MG/10ML IV SOLN
INTRAVENOUS | Status: DC | PRN
  Administered 2013-02-17: 30 mg via INTRAVENOUS

## 2013-02-17 MED ORDER — LACTATED RINGERS IV SOLN
INTRAVENOUS | Status: DC | PRN
  Administered 2013-02-17 (×2): via INTRAVENOUS

## 2013-02-17 MED ORDER — URSODIOL 300 MG PO CAPS
300.0000 mg | ORAL_CAPSULE | Freq: Two times a day (BID) | ORAL | Status: DC
  Administered 2013-02-17 – 2013-02-19 (×5): 300 mg via ORAL
  Filled 2013-02-17 (×7): qty 1

## 2013-02-17 MED ORDER — ONDANSETRON HCL 4 MG/2ML IJ SOLN
INTRAMUSCULAR | Status: AC
  Filled 2013-02-17: qty 2

## 2013-02-17 MED ORDER — EPHEDRINE SULFATE 50 MG/ML IJ SOLN
INTRAMUSCULAR | Status: AC
  Filled 2013-02-17: qty 1

## 2013-02-17 SURGICAL SUPPLY — 37 items
BALLN DILATOR CRE 12-15 240 (BALLOONS) ×3
BALLN RETRIEVAL 12X15 (BALLOONS) ×4 IMPLANT
BALLOON DILATOR CRE 12-15 240 (BALLOONS) ×1 IMPLANT
BALN RTRVL 200 6-7FR 12-15 (BALLOONS) ×4
BASKET TRAPEZOID 3X6 (MISCELLANEOUS) ×2 IMPLANT
BASKET TRAPEZOID LITHO 2.5X5 (MISCELLANEOUS) IMPLANT
BLOCK BITE 60FR ADLT L/F BLUE (MISCELLANEOUS) ×2 IMPLANT
BSKT STON RTRVL TRAPEZOID 3X6 (MISCELLANEOUS) ×2
CATH ACCESS DELIVERY (CATHETERS) IMPLANT
DEVICE INFLATION ENCORE 26 (MISCELLANEOUS) IMPLANT
DEVICE LOCKING W-BIOPSY CAP (MISCELLANEOUS) ×3 IMPLANT
ELECT REM PT RETURN 9FT ADLT (ELECTROSURGICAL) ×3
ELECTRODE REM PT RTRN 9FT ADLT (ELECTROSURGICAL) ×1 IMPLANT
FLOOR PAD 36X40 (MISCELLANEOUS) ×3
FORCEPS BIOP SPYBITE 1.2X286 (FORCEP) IMPLANT
GUIDEWIRE HYDRA JAGWIRE .35 (WIRE) IMPLANT
GUIDEWIRE JAG HINI 025X260CM (WIRE) IMPLANT
NDL HYPO 18GX1.5 BLUNT FILL (NEEDLE) IMPLANT
NEEDLE HYPO 18GX1.5 BLUNT FILL (NEEDLE) ×3 IMPLANT
NS IRRIG 500ML POUR BTL (IV SOLUTION) ×2 IMPLANT
PAD ARMBOARD 7.5X6 YLW CONV (MISCELLANEOUS) ×2 IMPLANT
PAD FLOOR 36X40 (MISCELLANEOUS) ×1 IMPLANT
PROBE VISUALIZATION SPYGLASS (PROBE) IMPLANT
SNARE ROTATE MED OVAL 20MM (MISCELLANEOUS) IMPLANT
SPHINCTEROTOME AUTOTOME .25 (MISCELLANEOUS) IMPLANT
SPHINCTEROTOME HYDRATOME 44 (MISCELLANEOUS) ×3 IMPLANT
SYR 20CC LL (SYRINGE) ×2 IMPLANT
SYR 3ML LL SCALE MARK (SYRINGE) ×2 IMPLANT
SYR 50ML LL SCALE MARK (SYRINGE) ×4 IMPLANT
SYR INFLATION 60ML (SYRINGE) ×2 IMPLANT
SYSTEM CONTINUOUS INJECTION (MISCELLANEOUS) ×3 IMPLANT
TUBE IRRIGATION (IRRIGATION / IRRIGATOR) IMPLANT
TUBING IRRIGATION ENDOGATOR (MISCELLANEOUS) ×2 IMPLANT
TWISTER PLUS 26MM (MISCELLANEOUS) ×2 IMPLANT
WALLSTENT METAL COVERED 10X60 (STENTS) IMPLANT
WALLSTENT METAL COVERED 10X80 (STENTS) IMPLANT
WATER STERILE IRR 1000ML POUR (IV SOLUTION) ×4 IMPLANT

## 2013-02-17 NOTE — Anesthesia Postprocedure Evaluation (Signed)
  Anesthesia Post-op Note  Patient: Lori Bishop  Procedure(s) Performed: Procedure(s): ENDOSCOPIC RETROGRADE CHOLANGIOPANCREATOGRAPHY (ERCP) (N/A) STONE EXTRACTION WITH BASKET and BALLOON (N/A) BALLOON DILATION (N/A)  Patient Location: PACU  Anesthesia Type:General  Level of Consciousness: awake, oriented and patient cooperative  Airway and Oxygen Therapy: Patient Spontanous Breathing  Post-op Pain: none  Post-op Assessment: Post-op Vital signs reviewed, Patient's Cardiovascular Status Stable, Respiratory Function Stable, Patent Airway, No signs of Nausea or vomiting and Pain level controlled  Post-op Vital Signs: Reviewed and stable  Complications: No apparent anesthesia complications

## 2013-02-17 NOTE — Progress Notes (Signed)
Pt requesting something for sore throat, notified Dr. Jena Gauss. Order received for chloraseptic spray prn. Spray is at bedside and pt has had some relief, but continues to c/o sore throat. Pt remained on clear liquids tonight per order, but tolerated them well, so should advance to full in the morning for breakfast. Sheryn Bison

## 2013-02-17 NOTE — Preoperative (Signed)
Beta Blockers   Reason not to administer Beta Blockers:Not Applicable 

## 2013-02-17 NOTE — Op Note (Signed)
Lori Bishop, HERSHBERGER                ACCOUNT NO.:  [id number]  MEDICAL RECORD NO.:  [id number]  LOCATION:  A338                          FACILITY:  [facility]  PHYSICIAN:  R. Roetta Sessions, MD FACP FACGDATE OF BIRTH:  09-08-1920  DATE OF PROCEDURE: DATE OF DISCHARGE:                              OPERATIVE REPORT   PROCEDURE:  ERCP with prior sphincterotomy, balloon dilation basket and balloon stone extraction.  INDICATIONS FOR PROCEDURE:  A 79 year old lady admitted with biliary pancreatitis.  Recent history of markedly dilated bile duct.  Multiple stones removed in the spring of this year.  Did well until recently. She presented with uncomplicated pancreatitis which is improved considerably.  Her LFTs and bilirubin were elevated.  MRCP demonstrated about 4 large stones and a dilated biliary tree.  ERCP is now being done.  Risks, benefits, limitations, alternatives, imponderables have been discussed with the patient and patient's daughter at length. Please see the extensive documentation in the medical record.  PROCEDURE NOTE:  General endotracheal anesthesia induced by Dr. [facility].  Patient was placed in semiprone position.  INSTRUMENT:  Pentax video chip system.  FINDINGS:  Cursory examination distal esophagus and stomach revealed no abnormalities.  Pylorus was easily traversed.  Examination of 1st and 2nd portion of the duodenum revealed again a large duodenal diverticulum.  Scope was pulled back to the short position 55 cm from the incisors.  A scout film was taken.  It appears that the prior sphincterotomy site which had been balloon dilated previously.  Had scarred down or at least had a very thin membrane at the orifice closing the aperture down significantly, I approached the ampullary orifice with the Microvasive sphincterotome and easily disrupted the membrane and the ampullary orifice did open up considerably with evidence of prior sphincterotomy being  present.  The sphincterotome and guidewire were easily advanced deep in the biliary tree.  The cholangiogram was obtained.  There were multiple filling defects at least four in a markedly dilated biliary tree.  There was no stricture evidence of neoplasm.  I elected to go ahead and dilate the sphincterotomy site additionally with the CRE balloon.  This was advanced across the ampullary orifice. Under fluoroscopic control, it was inflated to 13 mm x1 minute and then 13.5 mm x1 minute and then 14 mm x1 minute.  She was then taken down. This was done without apparent complication and minimal bleeding. Subsequently, I used extractor balloon and advanced it well up into the confluence and inflated it to accommodate some of the bile duct. Balloon occlusion cholangiogram was performed, with these maneuvers quite a bit of cholesterol and small pigment stones were recovered through the ampullary orifice.  There were additional stones to be retrieved.  I utilized the Dormia stone basket, advanced it deep into the biliary tree and retrieved multiple stones, and I went back with the extractor balloon and advanced it to the confluence and again 3 more times performed balloon occlusion cholangiogram with recovery of multiple additional piston-shaped stones.  All stones that were seen up in the bile duct were delivered through the ampullary orifice.  Last 2 passes yielded no further stone material and the duct  appeared to be patent with good drainage.  I elected not to attempt cholangioscopy as I felt we could adequately decompress the biliary tree and removed the important stones that were present.  The patient had a fairly gaping orifice, hopefully any residual stones would pass uneventfully.  The patient tolerated the procedure well  and was taken to PACU in stable condition.  IMPRESSION:  Markedly dilated biliary tree with choledocholithiasis, status post prior sphincterotomy balloon dilation  and balloon/basket stone extraction.  RECOMMENDATIONS: 1. Clear liquid supper, then advance as tolerated. 2. Check labs tomorrow morning. 3. As previously discussed, I do not feel Ms. Rosello should undergo any     further ERCPs (she has had a total of 3).  If she would     develop recurrent symptomatic stone disease, we would have to go     ahead and consider a choledochojejunostomy. 4. As discussed previously, I do feel the patient should begin     Actigall and take it lifelong in the hopes that this would minimize     the development of any recurrent stone disease. 5. We will begin Actigall 300 mg orally twice daily. 6. The final review of the films pending with the radiologist.     Jonathon Bellows, MD [facility]     RMR/MEDQ  D:  02/17/2013  T:  02/17/2013  Job:  [id number]  cc:   Tesfaye D. Felecia Shelling, MD Fax: [phone]

## 2013-02-17 NOTE — Anesthesia Preprocedure Evaluation (Addendum)
Anesthesia Evaluation    Airway       Dental   Pulmonary COPDformer smoker,          Cardiovascular     Neuro/Psych    GI/Hepatic   Endo/Other    Renal/GU      Musculoskeletal   Abdominal   Peds  Hematology   Anesthesia Other Findings   Reproductive/Obstetrics                        Anesthesia Physical Anesthesia Plan  ASA: II  Anesthesia Plan:    Post-op Pain Management:    Induction:   Airway Management Planned:   Additional Equipment:   Intra-op Plan:   Post-operative Plan:   Informed Consent:   Plan Discussed with:   Anesthesia Plan Comments:         Anesthesia Quick Evaluation                                  Anesthesia Evaluation  Patient identified by MRN, date of birth, ID band Patient awake    Reviewed: Allergy & Precautions, H&P , NPO status , Patient's Chart, lab work & pertinent test results  History of Anesthesia Complications Negative for: history of anesthetic complications  Airway Mallampati: II TM Distance: >3 FB Neck ROM: Full    Dental  (+) Edentulous Upper and Edentulous Lower   Pulmonary former smoker,    Pulmonary exam normal       Cardiovascular Exercise Tolerance: Poor hypertension, Pt. on medications Rate:Normal     Neuro/Psych Carpal tunnel numbness right hand; post plating right femur  Neuromuscular disease    GI/Hepatic Enzymes elevated   Endo/Other  Gout   Renal/GU      Musculoskeletal  (+) Arthritis -, Osteoarthritis,    Abdominal Normal abdominal exam  (+)   Peds  Hematology   Anesthesia Other Findings   Reproductive/Obstetrics                           Anesthesia Physical Anesthesia Plan  ASA: II  Anesthesia Plan: General   Post-op Pain Management:    Induction: Intravenous  Airway Management Planned: Oral ETT  Additional Equipment:   Intra-op Plan:    Post-operative Plan: Extubation in OR  Informed Consent: I have reviewed the patients History and Physical, chart, labs and discussed the procedure including the risks, benefits and alternatives for the proposed anesthesia with the patient or authorized representative who has indicated his/her understanding and acceptance.     Plan Discussed with: Anesthesiologist  Anesthesia Plan Comments:         Anesthesia Quick Evaluation

## 2013-02-17 NOTE — Anesthesia Procedure Notes (Signed)
Procedure Name: Intubation Date/Time: 02/17/2013 11:03 AM Performed by: Pernell Dupre, AMY A Pre-anesthesia Checklist: Patient identified, Patient being monitored, Timeout performed, Emergency Drugs available and Suction available Patient Re-evaluated:Patient Re-evaluated prior to inductionOxygen Delivery Method: Circle System Utilized Preoxygenation: Pre-oxygenation with 100% oxygen Intubation Type: IV induction Ventilation: Mask ventilation without difficulty Laryngoscope Size: 3 and Miller Grade View: Grade I Tube type: Oral Tube size: 7.0 mm Number of attempts: 1 Airway Equipment and Method: stylet Placement Confirmation: ETT inserted through vocal cords under direct vision,  positive ETCO2 and breath sounds checked- equal and bilateral Secured at: 21 cm Tube secured with: Tape Dental Injury: Teeth and Oropharynx as per pre-operative assessment

## 2013-02-17 NOTE — Progress Notes (Signed)
Subjective: Patient was admitted due to pancreatitis over the weekend. She was found to have common bile duct stone. She is planned for pancreatitis today.  Objective: Vital signs in last 24 hours: Temp:  [98 F (36.7 C)-98.4 F (36.9 C)] 98.4 F (36.9 C) (12/16 0557) Pulse Rate:  [63-74] 74 (12/16 0557) Resp:  [17-20] 20 (12/16 0557) BP: (127-152)/(54-69) 152/64 mmHg (12/16 0557) SpO2:  [92 %-97 %] 92 % (12/16 0557) Weight change:  Last BM Date: 02/14/13  Intake/Output from previous day: 12/15 0701 - 12/16 0700 In: 3758.3 [P.O.:480; I.V.:2378.3; IV Piggyback:900] Out: 2200 [Urine:2200]  PHYSICAL EXAM General appearance: alert and no distress Resp: clear to auscultation bilaterally Cardio: regular rate and rhythm, S1, S2 normal, no murmur, click, rub or gallop GI: soft, non-tender; bowel sounds normal; no masses,  no organomegaly Extremities: extremities normal, atraumatic, no cyanosis or edema  Lab Results:    @labtest @ [facility] No results found for this basename: PHART, PCO2, PO2ART, TCO2, HCO3,  in the last 72 hours CULTURES Recent Results (from the past 240 hour(s))  SURGICAL PCR SCREEN     Status: None   Collection Time    02/16/13  8:50 PM      Result Value Range Status   MRSA, PCR NEGATIVE  NEGATIVE Final   Staphylococcus aureus NEGATIVE  NEGATIVE Final   Comment:            The Xpert SA Assay (FDA     approved for NASAL specimens     in patients over 70 years of age),     is one component of     a comprehensive surveillance     program.  Test performance has     been validated by [employer] for patients greater     than or equal to 9 year old.     It is not intended     to diagnose infection nor to     guide or monitor treatment.   Studies/Results: Mr 3d Recon At Scanner  02/16/2013   CLINICAL DATA:  Right upper quadrant pain  EXAM: MR 3D RECON AT SCANNER  TECHNIQUE: Multiplanar multisequence MR imaging of the abdomen was performed, including  heavily T2-weighted images of the biliary and pancreatic ducts. Three-dimensional MR images were rendered by post processing of the original MR data.  CONTRAST:  13mL MULTIHANCE GADOBENATE DIMEGLUMINE 529 MG/ML IV SOLN  COMPARISON:  02/14/2013  FINDINGS: There are small bilateral pleural effusions identified. Moderate cardiac enlargement noted.  On the 45 and 90 sec delayed images there is a small enhancing liver abnormality along the dome. This measures 6 mm. No additional focal liver abnormalities noted. Previous cholecystectomy. Moderate intra and extrahepatic bile duct dilatation noted. The common bile duct is abnormally dilated measuring up to 1.3 cm. At least 4 stones are identified within the common bile duct which measure up to 1.2 cm. Normal appearance of the pancreas. The pancreatic duct is prominent but is within normal limits in caliber measuring 2.7 mm, image 44/series 21. No pancreatic mass identified. The spleen is unremarkable.  Normal appearance of both adrenal glands. Right kidney is normal. Several small cyst within the left kidney are too small to reliably characterize measuring up to 6 mm. No upper abdominal adenopathy identified. No ascites or focal fluid collections noted. There is a small ventral abdominal wall hernia containing fat, image 70/series 27.  IMPRESSION: 1. Intra and extrahepatic bile duct dilatation. 2. Choledocholithiasis. 3. Pleural effusions. 4. Small  enhancing liver abnormality is nonspecific but may represent a flash fill hemangioma or vascular phenomenon.   Electronically Signed   By: Signa Kell M.D.   On: 02/16/2013 12:00    Medications: I have reviewed the patient's current medications.  Assesment: Principal Problem:   Acute pancreatitis Active Problems:   GOUT   HYPERTENSION   RUQ pain   Elevated LFTs   Hepatic steatosis   Hyperbilirubinemia   Leukocytosis   Osteoarthritis    Plan: Medications reviewed ERCP as planned by GI Continue current  treatment    LOS: 3 days   Jimie Kuwahara 02/17/2013, 8:10 AM

## 2013-02-17 NOTE — Transfer of Care (Signed)
Immediate Anesthesia Transfer of Care Note  Patient: Lori Bishop  Procedure(s) Performed: Procedure(s): ENDOSCOPIC RETROGRADE CHOLANGIOPANCREATOGRAPHY (ERCP) (N/A) STONE EXTRACTION WITH BASKET and BALLOON (N/A) BALLOON DILATION (N/A)  Patient Location: PACU  Anesthesia Type:General  Level of Consciousness: awake, oriented and patient cooperative  Airway & Oxygen Therapy: Patient Spontanous Breathing and Patient connected to face mask oxygen  Post-op Assessment: Report given to PACU RN and Post -op Vital signs reviewed and stable  Post vital signs: Reviewed and stable  Complications: No apparent anesthesia complications

## 2013-02-18 DIAGNOSIS — K8050 Calculus of bile duct without cholangitis or cholecystitis without obstruction: Secondary | ICD-10-CM

## 2013-02-18 DIAGNOSIS — R7989 Other specified abnormal findings of blood chemistry: Secondary | ICD-10-CM

## 2013-02-18 DIAGNOSIS — R109 Unspecified abdominal pain: Secondary | ICD-10-CM

## 2013-02-18 LAB — HEPATIC FUNCTION PANEL
ALT: 97 U/L — ABNORMAL HIGH (ref 0–35)
AST: 30 U/L (ref 0–37)
Albumin: 2.5 g/dL — ABNORMAL LOW (ref 3.5–5.2)
Alkaline Phosphatase: 109 U/L (ref 39–117)
Total Bilirubin: 0.5 mg/dL (ref 0.3–1.2)
Total Protein: 5.6 g/dL — ABNORMAL LOW (ref 6.0–8.3)

## 2013-02-18 LAB — LIPASE, BLOOD: Lipase: 32 U/L (ref 11–59)

## 2013-02-18 NOTE — Anesthesia Postprocedure Evaluation (Signed)
  Anesthesia Post-op Note  Patient: Lori Bishop  Procedure(s) Performed: Procedure(s): ENDOSCOPIC RETROGRADE CHOLANGIOPANCREATOGRAPHY (ERCP) (N/A) STONE EXTRACTION WITH BASKET and BALLOON (N/A) BALLOON DILATION (N/A)  Patient Location: room 338  Anesthesia Type:General  Level of Consciousness: awake, alert , oriented and patient cooperative  Airway and Oxygen Therapy: Patient Spontanous Breathing  Post-op Pain: none  Post-op Assessment: Post-op Vital signs reviewed, Patient's Cardiovascular Status Stable, Respiratory Function Stable, Patent Airway, No signs of Nausea or vomiting and Pain level controlled  Post-op Vital Signs: Reviewed and stable  Complications: No apparent anesthesia complications

## 2013-02-18 NOTE — Progress Notes (Signed)
Subjective:  Tolerated breakfast. No n/v. Abd and throat "sore".   Objective: Vital signs in last 24 hours: Temp:  [98.4 F (36.9 C)-98.6 F (37 C)] 98.5 F (36.9 C) (12/17 0628) Pulse Rate:  [54-105] 54 (12/17 0628) Resp:  [16-21] 20 (12/17 0628) BP: (110-154)/(48-66) 137/54 mmHg (12/17 0628) SpO2:  [90 %-96 %] 96 % (12/17 0628) Weight:  [151 lb 3.2 oz (68.584 kg)] 151 lb 3.2 oz (68.584 kg) (12/17 0628) Last BM Date: 02/14/13 General:   Alert,  Well-developed, well-nourished, pleasant and cooperative in NAD Head:  Normocephalic and atraumatic. Eyes:  Sclera clear, no icterus.   Abdomen:  Soft,  nondistended.  Minimal epigastric tenderness. Normal bowel sounds, without guarding, and without rebound.   Extremities:  Without clubbing, deformity or edema. Neurologic:  Alert and  oriented x4;  grossly normal neurologically. Skin:  Intact without significant lesions or rashes. Psych:  Alert and cooperative. Normal mood and affect.  Intake/Output from previous day: 12/16 0701 - 12/17 0700 In: 2068.3 [I.V.:2068.3] Out: 1850 [Urine:1850] Intake/Output this shift: Total I/O In: 450 [P.O.:450] Out: -   Lab Results: CBC  Recent Labs  02/16/13 0528  WBC 9.7  HGB 11.9*  HCT 35.4*  MCV 87.4  PLT 219   BMET  Recent Labs  02/16/13 0528  NA 142  K 3.8  CL 109  CO2 28  GLUCOSE 105*  BUN 7  CREATININE 0.76  CALCIUM 8.8   LFTs  Recent Labs  02/16/13 0528 02/17/13 0604 02/18/13 0607  BILITOT 0.5 0.6 0.5  BILIDIR  --  0.2 0.2  IBILI  --  0.4 0.3  ALKPHOS 137* 135* 109  AST 63* 46* 30  ALT 184* 147* 97*  PROT 5.8* 6.5 5.6*  ALBUMIN 2.5* 3.0* 2.5*    Recent Labs  02/18/13 0607  LIPASE 32   PT/INR No results found for this basename: LABPROT, INR,  in the last 72 hours    Imaging Studies: Ct Abdomen W Contrast  02/14/2013   CLINICAL DATA:  Abdominal pain, elevated liver enzymes  EXAM: CT ABDOMEN WITH CONTRAST  TECHNIQUE: Multidetector CT imaging of the  abdomen was performed using the standard protocol following bolus administration of intravenous contrast.  CONTRAST:  50mL OMNIPAQUE IOHEXOL 300 MG/ML SOLN, 80mL OMNIPAQUE IOHEXOL 300 MG/ML SOLN  COMPARISON:  07/09/2012  FINDINGS: Ill-defined area of increased density projects in the left lung base primarily within the region of the medial basal segment. Hypoventilation is appreciated within the right lung base.  The liver demonstrates a diffuse low attenuating architecture as well as a very fine reticular pattern. No focal regions of abnormal attenuation or enhancement appreciated. Stable intra and extrahepatic biliary ductal dilatation is identified.  Evaluation of the pancreas demonstrates an area of heterogeneous enhancement within the uncinate process and hand. Mild inflammatory changes appreciated within the a tail region of the pancreas as well as areas of loss of the normal undulating border. No a focal loculated fluid collections are identified associated with the pancreas.  The adrenals, kidneys, and spleen are unremarkable.  There is no evidence of bowel obstruction nor secondary signs reflecting enteritis, colitis or diverticulitis within the visualized portions of the bowel. A small fat containing ventral wall hernia is identified measuring 2.3 x 2.2 cm stable.  There is no evidence of abdominal aortic aneurysm. Atherosclerotic calcifications identified within the aorta.  IMPRESSION:    : IMPRESSION:      *Atelectasis within the left lung base and infiltrate cannot be excluded if  clinically appropriate *Findings raise suspicion of early or mild pancreatitis correlation with pancreatic function tests is recommended *Hepatic steatosis as well as findings which may reflect a underlying component of cirrhotic changes within the liver. *Stable intra and extrahepatic biliary ductal dilatation *Atherosclerotic changes within the aorta   Electronically Signed   By: Salome Holmes M.D.   On: 02/14/2013 13:32    Mr 3d Recon At Scanner  02/16/2013   CLINICAL DATA:  Right upper quadrant pain  EXAM: MR 3D RECON AT SCANNER  TECHNIQUE: Multiplanar multisequence MR imaging of the abdomen was performed, including heavily T2-weighted images of the biliary and pancreatic ducts. Three-dimensional MR images were rendered by post processing of the original MR data.  CONTRAST:  13mL MULTIHANCE GADOBENATE DIMEGLUMINE 529 MG/ML IV SOLN  COMPARISON:  02/14/2013  FINDINGS: There are small bilateral pleural effusions identified. Moderate cardiac enlargement noted.  On the 45 and 90 sec delayed images there is a small enhancing liver abnormality along the dome. This measures 6 mm. No additional focal liver abnormalities noted. Previous cholecystectomy. Moderate intra and extrahepatic bile duct dilatation noted. The common bile duct is abnormally dilated measuring up to 1.3 cm. At least 4 stones are identified within the common bile duct which measure up to 1.2 cm. Normal appearance of the pancreas. The pancreatic duct is prominent but is within normal limits in caliber measuring 2.7 mm, image 44/series 21. No pancreatic mass identified. The spleen is unremarkable.  Normal appearance of both adrenal glands. Right kidney is normal. Several small cyst within the left kidney are too small to reliably characterize measuring up to 6 mm. No upper abdominal adenopathy identified. No ascites or focal fluid collections noted. There is a small ventral abdominal wall hernia containing fat, image 70/series 27.  IMPRESSION: 1. Intra and extrahepatic bile duct dilatation. 2. Choledocholithiasis. 3. Pleural effusions. 4. Small enhancing liver abnormality is nonspecific but may represent a flash fill hemangioma or vascular phenomenon.   Electronically Signed   By: Signa Kell M.D.   On: 02/16/2013 12:00   Dg Ercp  02/18/2013   ADDENDUM REPORT: 02/18/2013 08:55  ADDENDUM: Case was reviewed at the request of Dr. Jena Gauss. There are large filling  defects in the common bile duct with marked dilatation of the biliary tree. Subsequent images show balloon extraction of the stones. Final images show persistent biliary tree dilatation, without definite residual filling defect.   Electronically Signed   By: Leanna Battles M.D.   On: 02/18/2013 08:55   02/18/2013   CLINICAL DATA:  Common bile duct stone.  EXAM: ERCP  TECHNIQUE: Multiple spot images obtained with the fluoroscopic device and submitted for interpretation post-procedure.  COMPARISON:  MRCP 02/16/2013  FINDINGS: Multiple images are submitted from ERCP demonstrating dilated intrahepatic and extrahepatic biliary ducts. Balloon dilatation of the duct noted. Balloon and basket extraction of stones reportedly performed. Final image demonstrates continued dilatation of the biliary system. It is difficult to exclude retained CBD stones due to overlying contrast.  IMPRESSION: Dilated biliary system as above. Reported balloon and passes extraction of stones.  These images were submitted for radiologic interpretation only. Please see the procedural report for the amount of contrast and the fluoroscopy time utilized.  Electronically Signed: By: Charlett Nose M.D. On: 02/17/2013 16:54   Mr Abd W/wo Cm/mrcp  02/17/2013   CLINICAL DATA:  Right upper quadrant pain  EXAM: MR 3D RECON AT SCANNER  TECHNIQUE: Multiplanar multisequence MR imaging of the abdomen was performed, including heavily T2-weighted  images of the biliary and pancreatic ducts. Three-dimensional MR images were rendered by post processing of the original MR data.  CONTRAST:  13mL MULTIHANCE GADOBENATE DIMEGLUMINE 529 MG/ML IV SOLN  COMPARISON:  02/14/2013  FINDINGS: There are small bilateral pleural effusions identified. Moderate cardiac enlargement noted.  On the 45 and 90 sec delayed images there is a small enhancing liver abnormality along the dome. This measures 6 mm. No additional focal liver abnormalities noted. Previous cholecystectomy.  Moderate intra and extrahepatic bile duct dilatation noted. The common bile duct is abnormally dilated measuring up to 1.3 cm. At least 4 stones are identified within the common bile duct which measure up to 1.2 cm. Normal appearance of the pancreas. The pancreatic duct is prominent but is within normal limits in caliber measuring 2.7 mm, image 44/series 21. No pancreatic mass identified. The spleen is unremarkable.  Normal appearance of both adrenal glands. Right kidney is normal. Several small cyst within the left kidney are too small to reliably characterize measuring up to 6 mm. No upper abdominal adenopathy identified. No ascites or focal fluid collections noted. There is a small ventral abdominal wall hernia containing fat, image 70/series 27.  IMPRESSION:  IMPRESSION: 1. Intra and extrahepatic bile duct dilatation. 2. Choledocholithiasis. 3. Pleural effusions. 4. Small enhancing liver abnormality is nonspecific but may represent a flash fill hemangioma or vascular phenomenon.   Electronically Signed   By: Signa Kell M.D.   On: 02/17/2013 15:32   US Abdomen Limited Ruq  02/14/2013   CLINICAL DATA:  Right upper quadrant pain  EXAM: US ABDOMEN LIMITED - RIGHT UPPER QUADRANT  COMPARISON:  05/23/2012  FINDINGS: Gallbladder  Surgically removed.  Common bile duct  Diameter: 8.1 mm. Mild intrahepatic ductal dilatation is noted as well consistent with the post cholecystectomy state.  Liver:  Mild biliary ductal dilatation is noted. No focal mass lesion is seen.  IMPRESSION: Status post cholecystectomy.  No acute abnormality is noted.   Electronically Signed   By: Alcide Clever M.D.   On: 02/14/2013 10:12  [2 weeks]   Assessment: ERCP showed markedly dilated biliary tree with multiple CBD stones, s/p prior sphincterotomy with balloon dilation, basket and balloon stone extraction. Clinically doing well. LFTs improved. Actigall started with hope of preventing need for further ERCPs for recurrent symptomatic  stones.   Plan: 1. Continue actigall 300mg  BID at discharge, long-term. 2. Doing well from GI standpoint, stable for discharge. 3. Patient requesting advice for constipation. Miralax 17g daily too strong. Advised to take 1/2 capful daily or 1/2 to 1 capful every other day.   LOS: 4 days   Tana Coast  02/18/2013, 1:39 PM

## 2013-02-18 NOTE — Progress Notes (Signed)
Subjective: Patient had ERCP with prior sphincterotomy, balloon dilation basket and  balloon stone extraction yesterday. She is resting. No nausea or vomiting.   Objective: Vital signs in last 24 hours: Temp:  [97.8 F (36.6 C)-98.6 F (37 C)] 98.5 F (36.9 C) (12/17 0628) Pulse Rate:  [54-105] 54 (12/17 0628) Resp:  [10-27] 20 (12/17 0628) BP: (110-155)/(48-109) 137/54 mmHg (12/17 0628) SpO2:  [89 %-100 %] 96 % (12/17 0628) FiO2 (%):  [3 %] 3 % (12/16 0955) Weight:  [68.584 kg (151 lb 3.2 oz)] 68.584 kg (151 lb 3.2 oz) (12/17 9562) Weight change:  Last BM Date: 02/14/13  Intake/Output from previous day: 12/16 0701 - 12/17 0700 In: 2068.3 [I.V.:2068.3] Out: 1850 [Urine:1850]  PHYSICAL EXAM General appearance: alert and no distress Resp: clear to auscultation bilaterally Cardio: regular rate and rhythm, S1, S2 normal, no murmur, click, rub or gallop GI: soft, non-tender; bowel sounds normal; no masses,  no organomegaly Extremities: extremities normal, atraumatic, no cyanosis or edema  Lab Results:    @labtest @ [facility] No results found for this basename: PHART, PCO2, PO2ART, TCO2, HCO3,  in the last 72 hours CULTURES Recent Results (from the past 240 hour(s))  SURGICAL PCR SCREEN     Status: None   Collection Time    02/16/13  8:50 PM      Result Value Range Status   MRSA, PCR NEGATIVE  NEGATIVE Final   Staphylococcus aureus NEGATIVE  NEGATIVE Final   Comment:            The Xpert SA Assay (FDA     approved for NASAL specimens     in patients over 10 years of age),     is one component of     a comprehensive surveillance     program.  Test performance has     been validated by [employer] for patients greater     than or equal to 39 year old.     It is not intended     to diagnose infection nor to     guide or monitor treatment.   Studies/Results: Mr 3d Recon At Scanner  02/16/2013   CLINICAL DATA:  Right upper quadrant pain  EXAM: MR 3D RECON AT SCANNER   TECHNIQUE: Multiplanar multisequence MR imaging of the abdomen was performed, including heavily T2-weighted images of the biliary and pancreatic ducts. Three-dimensional MR images were rendered by post processing of the original MR data.  CONTRAST:  13mL MULTIHANCE GADOBENATE DIMEGLUMINE 529 MG/ML IV SOLN  COMPARISON:  02/14/2013  FINDINGS: There are small bilateral pleural effusions identified. Moderate cardiac enlargement noted.  On the 45 and 90 sec delayed images there is a small enhancing liver abnormality along the dome. This measures 6 mm. No additional focal liver abnormalities noted. Previous cholecystectomy. Moderate intra and extrahepatic bile duct dilatation noted. The common bile duct is abnormally dilated measuring up to 1.3 cm. At least 4 stones are identified within the common bile duct which measure up to 1.2 cm. Normal appearance of the pancreas. The pancreatic duct is prominent but is within normal limits in caliber measuring 2.7 mm, image 44/series 21. No pancreatic mass identified. The spleen is unremarkable.  Normal appearance of both adrenal glands. Right kidney is normal. Several small cyst within the left kidney are too small to reliably characterize measuring up to 6 mm. No upper abdominal adenopathy identified. No ascites or focal fluid collections noted. There is a small ventral abdominal wall hernia  containing fat, image 70/series 27.  IMPRESSION: 1. Intra and extrahepatic bile duct dilatation. 2. Choledocholithiasis. 3. Pleural effusions. 4. Small enhancing liver abnormality is nonspecific but may represent a flash fill hemangioma or vascular phenomenon.   Electronically Signed   By: Signa Kell M.D.   On: 02/16/2013 12:00   Dg Ercp  02/17/2013   CLINICAL DATA:  Common bile duct stone.  EXAM: ERCP  TECHNIQUE: Multiple spot images obtained with the fluoroscopic device and submitted for interpretation post-procedure.  COMPARISON:  MRCP 02/16/2013  FINDINGS: Multiple images are  submitted from ERCP demonstrating dilated intrahepatic and extrahepatic biliary ducts. Balloon dilatation of the duct noted. Balloon and basket extraction of stones reportedly performed. Final image demonstrates continued dilatation of the biliary system. It is difficult to exclude retained CBD stones due to overlying contrast.  IMPRESSION: Dilated biliary system as above. Reported balloon and passes extraction of stones.  These images were submitted for radiologic interpretation only. Please see the procedural report for the amount of contrast and the fluoroscopy time utilized.   Electronically Signed   By: Charlett Nose M.D.   On: 02/17/2013 16:54   Mr Abd W/wo Cm/mrcp  02/17/2013   CLINICAL DATA:  Right upper quadrant pain  EXAM: MR 3D RECON AT SCANNER  TECHNIQUE: Multiplanar multisequence MR imaging of the abdomen was performed, including heavily T2-weighted images of the biliary and pancreatic ducts. Three-dimensional MR images were rendered by post processing of the original MR data.  CONTRAST:  13mL MULTIHANCE GADOBENATE DIMEGLUMINE 529 MG/ML IV SOLN  COMPARISON:  02/14/2013  FINDINGS: There are small bilateral pleural effusions identified. Moderate cardiac enlargement noted.  On the 45 and 90 sec delayed images there is a small enhancing liver abnormality along the dome. This measures 6 mm. No additional focal liver abnormalities noted. Previous cholecystectomy. Moderate intra and extrahepatic bile duct dilatation noted. The common bile duct is abnormally dilated measuring up to 1.3 cm. At least 4 stones are identified within the common bile duct which measure up to 1.2 cm. Normal appearance of the pancreas. The pancreatic duct is prominent but is within normal limits in caliber measuring 2.7 mm, image 44/series 21. No pancreatic mass identified. The spleen is unremarkable.  Normal appearance of both adrenal glands. Right kidney is normal. Several small cyst within the left kidney are too small to  reliably characterize measuring up to 6 mm. No upper abdominal adenopathy identified. No ascites or focal fluid collections noted. There is a small ventral abdominal wall hernia containing fat, image 70/series 27.  IMPRESSION:  IMPRESSION: 1. Intra and extrahepatic bile duct dilatation. 2. Choledocholithiasis. 3. Pleural effusions. 4. Small enhancing liver abnormality is nonspecific but may represent a flash fill hemangioma or vascular phenomenon.   Electronically Signed   By: Signa Kell M.D.   On: 02/17/2013 15:32    Medications: I have reviewed the patient's current medications.  Assesment: Principal Problem:   Acute pancreatitis Active Problems:   GOUT   HYPERTENSION   RUQ pain   Elevated LFTs   Hepatic steatosis   Hyperbilirubinemia   Leukocytosis   Osteoarthritis    Plan: Medications reviewed As per GI plan. Continue current treatment    LOS: 4 days   Ketura Sirek 02/18/2013, 7:48 AM

## 2013-02-19 ENCOUNTER — Encounter (HOSPITAL_COMMUNITY): Payer: Self-pay | Admitting: Internal Medicine

## 2013-02-19 MED ORDER — URSODIOL 300 MG PO CAPS: 300.0000 mg | ORAL_CAPSULE | Freq: Two times a day (BID) | ORAL | Status: DC

## 2013-02-19 NOTE — Plan of Care (Signed)
Problem: Phase III Progression Outcomes Goal: Activity at appropriate level-compared to baseline (UP IN CHAIR FOR HEMODIALYSIS)  Outcome: Completed/Met Date Met:  02/19/13 Ambulatory with assist and front wheel walker

## 2013-03-06 NOTE — Progress Notes (Signed)
REVIEWED.  

## 2013-03-17 NOTE — Discharge Summary (Signed)
Physician Discharge Summary  Patient ID: Lori Bishop MRN: [id number] DOB/AGE: [birth year] 80 y.o. Primary Care Physician:Zinnia Tindall, MD Admit date: 02/14/2013 Discharge date:12/18/114    Discharge Diagnoses:   Principal Problem:   Acute pancreatitis Active Problems:   GOUT   HYPERTENSION   RUQ pain   Elevated LFTs   Hepatic steatosis   Hyperbilirubinemia   Leukocytosis   Osteoarthritis S/P ERCP and S/P prior sphincterotomy with ballon dilation and stone extraction    Medication List         ADVIL PM 200-38 MG Tabs  Generic drug:  Ibuprofen-Diphenhydramine Cit  Take 1 tablet by mouth at bedtime as needed (sleep/pain).     allopurinol 100 MG tablet  Commonly known as:  ZYLOPRIM  Take 100 mg by mouth daily.     CALTRATE 600+D PLUS MINERALS 600-800 MG-UNIT Tabs  Take 1 tablet by mouth 2 (two) times daily.     felodipine 10 MG 24 hr tablet  Commonly known as:  PLENDIL  Take 10 mg by mouth daily.     fentaNYL 100 MCG/HR  Commonly known as:  DURAGESIC - dosed mcg/hr  Place 1 patch onto the skin every 3 (three) days.     polyethylene glycol packet  Commonly known as:  MIRALAX / GLYCOLAX  Take 17 g by mouth daily as needed for moderate constipation.     potassium chloride SA 20 MEQ tablet  Commonly known as:  K-DUR,KLOR-CON  Take 40 mEq by mouth 2 (two) times daily.     torsemide 20 MG tablet  Commonly known as:  DEMADEX  Take 20 mg by mouth daily.     traMADol-acetaminophen 37.5-325 MG per tablet  Commonly known as:  ULTRACET  Take 1 tablet by mouth 3 (three) times daily as needed for pain.     ursodiol 300 MG capsule  Commonly known as:  ACTIGALL  Take 1 capsule (300 mg total) by mouth 2 (two) times daily.        Discharged Condition: improved    Consults: GI  Significant Diagnostic Studies: Mr 3d Recon At Scanner  06-Mar-2013   CLINICAL DATA:  Right upper quadrant pain  EXAM: MR 3D RECON AT SCANNER  TECHNIQUE: Multiplanar multisequence MR  imaging of the abdomen was performed, including heavily T2-weighted images of the biliary and pancreatic ducts. Three-dimensional MR images were rendered by post processing of the original MR data.  CONTRAST:  13mL MULTIHANCE GADOBENATE DIMEGLUMINE 529 MG/ML IV SOLN  COMPARISON:  02/14/2013  FINDINGS: There are small bilateral pleural effusions identified. Moderate cardiac enlargement noted.  On the 45 and 90 sec delayed images there is a small enhancing liver abnormality along the dome. This measures 6 mm. No additional focal liver abnormalities noted. Previous cholecystectomy. Moderate intra and extrahepatic bile duct dilatation noted. The common bile duct is abnormally dilated measuring up to 1.3 cm. At least 4 stones are identified within the common bile duct which measure up to 1.2 cm. Normal appearance of the pancreas. The pancreatic duct is prominent but is within normal limits in caliber measuring 2.7 mm, image 44/series 21. No pancreatic mass identified. The spleen is unremarkable.  Normal appearance of both adrenal glands. Right kidney is normal. Several small cyst within the left kidney are too small to reliably characterize measuring up to 6 mm. No upper abdominal adenopathy identified. No ascites or focal fluid collections noted. There is a small ventral abdominal wall hernia containing fat, image 70/series 27.  IMPRESSION: 1. Intra and extrahepatic bile  duct dilatation. 2. Choledocholithiasis. 3. Pleural effusions. 4. Small enhancing liver abnormality is nonspecific but may represent a flash fill hemangioma or vascular phenomenon.   Electronically Signed   By: Signa Kellaylor  Stroud M.D.   On: 02/16/2013 12:00   Dg Ercp  02/18/2013   ADDENDUM REPORT: 02/18/2013 08:55  ADDENDUM: Case was reviewed at the request of Dr. Jena Gaussourk. There are large filling defects in the common bile duct with marked dilatation of the biliary tree. Subsequent images show balloon extraction of the stones. Final images show persistent  biliary tree dilatation, without definite residual filling defect.   Electronically Signed   By: Leanna BattlesMelinda  Blietz M.D.   On: 02/18/2013 08:55   02/18/2013   CLINICAL DATA:  Common bile duct stone.  EXAM: ERCP  TECHNIQUE: Multiple spot images obtained with the fluoroscopic device and submitted for interpretation post-procedure.  COMPARISON:  MRCP 02/16/2013  FINDINGS: Multiple images are submitted from ERCP demonstrating dilated intrahepatic and extrahepatic biliary ducts. Balloon dilatation of the duct noted. Balloon and basket extraction of stones reportedly performed. Final image demonstrates continued dilatation of the biliary system. It is difficult to exclude retained CBD stones due to overlying contrast.  IMPRESSION: Dilated biliary system as above. Reported balloon and passes extraction of stones.  These images were submitted for radiologic interpretation only. Please see the procedural report for the amount of contrast and the fluoroscopy time utilized.  Electronically Signed: By: Charlett NoseKevin  Dover M.D. On: 02/17/2013 16:54   Mr Abd W/wo Cm/mrcp  02/17/2013   CLINICAL DATA:  Right upper quadrant pain  EXAM: MR 3D RECON AT SCANNER  TECHNIQUE: Multiplanar multisequence MR imaging of the abdomen was performed, including heavily T2-weighted images of the biliary and pancreatic ducts. Three-dimensional MR images were rendered by post processing of the original MR data.  CONTRAST:  13mL MULTIHANCE GADOBENATE DIMEGLUMINE 529 MG/ML IV SOLN  COMPARISON:  02/14/2013  FINDINGS: There are small bilateral pleural effusions identified. Moderate cardiac enlargement noted.  On the 45 and 90 sec delayed images there is a small enhancing liver abnormality along the dome. This measures 6 mm. No additional focal liver abnormalities noted. Previous cholecystectomy. Moderate intra and extrahepatic bile duct dilatation noted. The common bile duct is abnormally dilated measuring up to 1.3 cm. At least 4 stones are identified within  the common bile duct which measure up to 1.2 cm. Normal appearance of the pancreas. The pancreatic duct is prominent but is within normal limits in caliber measuring 2.7 mm, image 44/series 21. No pancreatic mass identified. The spleen is unremarkable.  Normal appearance of both adrenal glands. Right kidney is normal. Several small cyst within the left kidney are too small to reliably characterize measuring up to 6 mm. No upper abdominal adenopathy identified. No ascites or focal fluid collections noted. There is a small ventral abdominal wall hernia containing fat, image 70/series 27.  IMPRESSION:  IMPRESSION: 1. Intra and extrahepatic bile duct dilatation. 2. Choledocholithiasis. 3. Pleural effusions. 4. Small enhancing liver abnormality is nonspecific but may represent a flash fill hemangioma or vascular phenomenon.   Electronically Signed   By: Signa Kellaylor  Stroud M.D.   On: 02/17/2013 15:32    Lab Results: Basic Metabolic Panel: No results found for this basename: NA, K, CL, CO2, GLUCOSE, BUN, CREATININE, CALCIUM, MG, PHOS,  in the last 72 hours Liver Function Tests: No results found for this basename: AST, ALT, ALKPHOS, BILITOT, PROT, ALBUMIN,  in the last 72 hours   CBC: No results found for this basename:  WBC, NEUTROABS, HGB, HCT, MCV, PLT,  in the last 72 hours  No results found for this or any previous visit (from the past 240 hour(s)).   Hospital Course:  This is a 80 years old female patient who was admotted due to abdominal pain secondary to pancreatitis. She was known to have common bile duct stone. ErCP was done and stone was extracted. Patient improved and discharged in stable condition to be followed in out patient.   Discharge Exam: Blood pressure 134/76, pulse 52, temperature 97.9 F (36.6 C), temperature source Oral, resp. rate 20, height 5' (1.524 m), weight 68.6 kg (151 lb 3.8 oz), SpO2 91.00%.   Disposition:  home        Follow-up Information   Follow up with  [facility], MD On 02/27/2013. (at 9:30 am)    Specialty:  Internal Medicine   Contact information:   [address] [place] [address] [phone]       Signed: Avon Gully  03/17/2013, 7:14 AM

## 2013-04-16 ENCOUNTER — Ambulatory Visit (INDEPENDENT_AMBULATORY_CARE_PROVIDER_SITE_OTHER): Payer: Medicare Other | Admitting: Gastroenterology

## 2013-04-16 ENCOUNTER — Encounter (INDEPENDENT_AMBULATORY_CARE_PROVIDER_SITE_OTHER): Payer: Self-pay

## 2013-04-16 ENCOUNTER — Encounter: Payer: Self-pay | Admitting: Gastroenterology

## 2013-04-16 VITALS — BP 158/76 | HR 82 | Temp 98.5°F | Wt 135.0 lb

## 2013-04-16 DIAGNOSIS — K8050 Calculus of bile duct without cholangitis or cholecystitis without obstruction: Secondary | ICD-10-CM

## 2013-04-16 NOTE — Patient Instructions (Signed)
Continue to take Actigall twice a day.   Continue Miralax daily as needed for constipation.  Please call with any problems whatsoever!  We will see you in 6 months!

## 2013-04-16 NOTE — Progress Notes (Signed)
Referring Provider: Avon GullyFanta, Tesfaye, MD Primary Care Physician:  Avon GullyFANTA,TESFAYE, MD Primary GI: Dr. Jena Gaussourk   Chief Complaint  Patient presents with  . Follow-up    HPI:   Lori Bishop presents today in follow-up after ERCP with sphincterotomy and stone extraction. This is her third procedure since March last year. Denies abdominal pain, N/V. Good appetite. Constipation managed with Miralax, which works the best for her. Takes every night as needed. BM usually every 2 days, then takes something. No rectal bleeding. Lasix recently increased by PCP.     Past Medical History  Diagnosis Date  . Gout   . Osteoarthritis   . Leg cramps   . Choledocholithiasis 3 2014 and 5 2014    S/P ERCP and sphincterotomy x2    Past Surgical History  Procedure Laterality Date  . Cholecystectomy    . Appendectomy    . Cataract extraction Bilateral   . Wrist arthroplasty Right   . Right femur      metal plate  . Breast biopsy Left   . Ercp  05/23/2012    RMR: juxta-ampullary duodenal diverticulum, markedly dilated biliary tree with choledocholithiasis, s/p sphincterotomy with biliary sphincterotomy balloon dilation, balloon and basket stone extraction, stent placement  . Removal of stones  05/23/2012    Procedure: BALLOON AND BASKET REMOVAL OF STONES;  Surgeon: Corbin Adeobert M Rourk, MD;  Location: [facility];  Service: Endoscopy;;  . Sphincterotomy  05/23/2012    Procedure: Dennison MascotSPHINCTEROTOMY;  Surgeon: Corbin Adeobert M Rourk, MD;  Location: [facility];  Service: Endoscopy;;  . Biliary stent placement  05/23/2012    Procedure: BILIARY STENT PLACEMENT;  Surgeon: Corbin Adeobert M Rourk, MD;  Location: [facility];  Service: Endoscopy;;  . Balloon dilation  05/23/2012    Procedure: AMPULLARY BALLOON DILATION;  Surgeon: Corbin Adeobert M Rourk, MD;  Location: [facility];  Service: Endoscopy;;  . Ercp N/A 07/24/2012    ZOX:[id number]:Juxta-ampullary duodenal diverticulum/s/p dilation sphincterotomy site s/p basket stone extraction s/p cholangioscopy with holmium  lithotripsy of common duct stones  . Spyglass cholangioscopy N/A 07/24/2012    Procedure: YNWGNFAOSPYGLASS CHOLANGIOSCOPY;  Surgeon: Corbin Adeobert M Rourk, MD;  Location: [facility];  Service: Endoscopy;  Laterality: N/A;  . Spyglass lithotripsy N/A 07/24/2012    Procedure: ZHYQMVHQSPYGLASS LITHOTRIPSY;  Surgeon: Corbin Adeobert M Rourk, MD;  Location: [facility];  Service: Endoscopy;  Laterality: N/A;  . Removal of stones N/A 07/24/2012    Procedure: REMOVAL OF STONES;  Surgeon: Corbin Adeobert M Rourk, MD;  Location: [facility];  Service: Endoscopy;  Laterality: N/A;  . Balloon dilation N/A 07/24/2012    Procedure: BALLOON DILATION; Balloon basket dredge and balloon dialate sphincterotomy;  Surgeon: Corbin Adeobert M Rourk, MD;  Location: [facility];  Service: Endoscopy;  Laterality: N/A;  . Holmium laser application N/A 07/24/2012    Procedure: HOLMIUM LASER APPLICATION;  Surgeon: Corbin Adeobert M Rourk, MD;  Location: [facility];  Service: Endoscopy;  Laterality: N/A;  . Ercp N/A 02/17/2013    Procedure: ENDOSCOPIC RETROGRADE CHOLANGIOPANCREATOGRAPHY (ERCP);  Surgeon: Corbin Adeobert M Rourk, MD;  Location: [facility];  Service: Endoscopy;  Laterality: N/A;  . Stone extraction with basket N/A 02/17/2013    Procedure: STONE EXTRACTION WITH BASKET and BALLOON;  Surgeon: Corbin Adeobert M Rourk, MD;  Location: [facility];  Service: Endoscopy;  Laterality: N/A;  . Balloon dilation N/A 02/17/2013    Procedure: BALLOON DILATION;  Surgeon: Corbin Adeobert M Rourk, MD;  Location: [facility];  Service: Endoscopy;  Laterality: N/A;    Current Outpatient Prescriptions  Medication Sig Dispense  Refill  . allopurinol (ZYLOPRIM) 100 MG tablet Take 100 mg by mouth daily.      . Calcium Carbonate-Vit D-Min (CALTRATE 600+D PLUS MINERALS) 600-800 MG-UNIT TABS Take 1 tablet by mouth 2 (two) times daily.      . felodipine (PLENDIL) 10 MG 24 hr tablet Take 10 mg by mouth daily.      . fentaNYL (DURAGESIC - DOSED MCG/HR) 100 MCG/HR Place 1 patch onto the skin every 3 (three) days.      . Ibuprofen-Diphenhydramine Cit (ADVIL PM)  200-38 MG TABS Take 1 tablet by mouth at bedtime as needed (sleep/pain).      . polyethylene glycol (MIRALAX / GLYCOLAX) packet Take 17 g by mouth daily as needed for moderate constipation.      . potassium chloride SA (K-DUR,KLOR-CON) 20 MEQ tablet Take 40 mEq by mouth 2 (two) times daily.      Marland Kitchen torsemide (DEMADEX) 20 MG tablet Take 20 mg by mouth daily.      . traMADol-acetaminophen (ULTRACET) 37.5-325 MG per tablet Take 1 tablet by mouth 3 (three) times daily as needed for pain.      . ursodiol (ACTIGALL) 300 MG capsule Take 1 capsule (300 mg total) by mouth 2 (two) times daily.  60 capsule  3   No current facility-administered medications for this visit.    Allergies as of 04/16/2013  . (No Known Allergies)    Family History  Problem Relation Age of Onset  . Stroke Father   . CAD Mother   . Cancer Son     ?metastatic    History   Social History  . Marital Status: Widowed    Spouse Name: N/A    Number of Children: 1  . Years of Education: N/A   Occupational History  . 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 Smoking status: Former Smoker -- 0.25 packs/day for 15 years    Types: Cigarettes    Quit date: 07/17/1988  . Smokeless tobacco: None  . Alcohol Use: No  . Drug Use: No  . Sexual Activity: Yes    Birth Control/ Protection: Post-menopausal   Other Topics Concern  . None   Social History Narrative   Lives alone   Candiss Norse, niece & caregiver, as well as POA          Review of Systems: As mentioned in HPI.   Physical Exam: BP 158/76  Pulse 82  Temp(Src) 98.5 F (36.9 C) (Oral)  Wt 135 lb (61.236 kg) General:   Alert and oriented. No distress noted. Pleasant and cooperative.  Head:  Normocephalic and atraumatic. Eyes:  Conjuctiva clear without scleral icterus Heart:  S1, S2 present without murmurs, rubs, or gallops. Regular rate and rhythm. Abdomen:  +BS, soft, non-tender and non-distended. No rebound or guarding. No HSM or masses  noted. Msk:  Symmetrical without gross deformities. Normal posture. Extremities:  Without edema. Neurologic:  Alert and  oriented x4;  grossly normal neurologically. Skin:  Intact without significant lesions or rashes.. Psych:  Alert and cooperative. Normal mood and affect.

## 2013-04-17 ENCOUNTER — Encounter: Payer: Self-pay | Admitting: Gastroenterology

## 2013-04-17 DIAGNOSIS — K8050 Calculus of bile duct without cholangitis or cholecystitis without obstruction: Secondary | ICD-10-CM | POA: Insufficient documentation

## 2013-04-17 NOTE — Assessment & Plan Note (Signed)
Doing well s/p ERCP with sphincterotomy and stone extraction. This was her 3rd procedure. Doing well currently. Remain on Actigall BID. If further issues, consider choledochojejunostomy . Continue Miralax prn constipation. 6 month return.

## 2013-04-20 NOTE — Progress Notes (Signed)
cc'd to pcp 

## 2013-05-19 ENCOUNTER — Ambulatory Visit (HOSPITAL_COMMUNITY)
Admission: RE | Admit: 2013-05-19 | Discharge: 2013-05-19 | Disposition: A | Discharge status: Home / Self Care | Payer: Medicare Other | Source: Ambulatory Visit | Attending: Internal Medicine | Admitting: Internal Medicine

## 2013-05-19 DIAGNOSIS — I359 Nonrheumatic aortic valve disorder, unspecified: Secondary | ICD-10-CM | POA: Insufficient documentation

## 2013-05-19 DIAGNOSIS — I10 Essential (primary) hypertension: Secondary | ICD-10-CM | POA: Insufficient documentation

## 2013-05-19 DIAGNOSIS — M7989 Other specified soft tissue disorders: Secondary | ICD-10-CM | POA: Insufficient documentation

## 2013-05-19 DIAGNOSIS — I079 Rheumatic tricuspid valve disease, unspecified: Secondary | ICD-10-CM | POA: Insufficient documentation

## 2013-05-19 NOTE — Progress Notes (Signed)
*  PRELIMINARY RESULTS* Echocardiogram 2D Echocardiogram has been performed.  Jeanluc Wegman 05/19/2013, 1:43 PM

## 2013-09-24 ENCOUNTER — Emergency Department (HOSPITAL_COMMUNITY)
Admission: EM | Admit: 2013-09-24 | Discharge: 2013-09-24 | Disposition: A | Discharge status: Home / Self Care | Payer: Medicare Other | Attending: Emergency Medicine | Admitting: Emergency Medicine

## 2013-09-24 ENCOUNTER — Encounter (HOSPITAL_COMMUNITY): Payer: Self-pay | Admitting: Emergency Medicine

## 2013-09-24 ENCOUNTER — Emergency Department (HOSPITAL_COMMUNITY): Payer: Medicare Other

## 2013-09-24 DIAGNOSIS — Z8739 Personal history of other diseases of the musculoskeletal system and connective tissue: Secondary | ICD-10-CM | POA: Insufficient documentation

## 2013-09-24 DIAGNOSIS — R1032 Left lower quadrant pain: Secondary | ICD-10-CM | POA: Diagnosis not present

## 2013-09-24 DIAGNOSIS — R5383 Other fatigue: Secondary | ICD-10-CM

## 2013-09-24 DIAGNOSIS — Z8719 Personal history of other diseases of the digestive system: Secondary | ICD-10-CM | POA: Diagnosis not present

## 2013-09-24 DIAGNOSIS — R52 Pain, unspecified: Secondary | ICD-10-CM | POA: Diagnosis not present

## 2013-09-24 DIAGNOSIS — Z87891 Personal history of nicotine dependence: Secondary | ICD-10-CM | POA: Insufficient documentation

## 2013-09-24 DIAGNOSIS — Z9089 Acquired absence of other organs: Secondary | ICD-10-CM | POA: Insufficient documentation

## 2013-09-24 DIAGNOSIS — R109 Unspecified abdominal pain: Secondary | ICD-10-CM

## 2013-09-24 DIAGNOSIS — R5381 Other malaise: Secondary | ICD-10-CM | POA: Diagnosis present

## 2013-09-24 DIAGNOSIS — R1031 Right lower quadrant pain: Secondary | ICD-10-CM | POA: Diagnosis not present

## 2013-09-24 DIAGNOSIS — M109 Gout, unspecified: Secondary | ICD-10-CM | POA: Insufficient documentation

## 2013-09-24 DIAGNOSIS — E876 Hypokalemia: Secondary | ICD-10-CM

## 2013-09-24 DIAGNOSIS — Z79899 Other long term (current) drug therapy: Secondary | ICD-10-CM | POA: Diagnosis not present

## 2013-09-24 DIAGNOSIS — Z90710 Acquired absence of both cervix and uterus: Secondary | ICD-10-CM | POA: Insufficient documentation

## 2013-09-24 LAB — HEPATIC FUNCTION PANEL
ALBUMIN: 4.4 g/dL (ref 3.5–5.2)
ALT: 20 U/L (ref 0–35)
AST: 29 U/L (ref 0–37)
Alkaline Phosphatase: 107 U/L (ref 39–117)
Bilirubin, Direct: <0.2 mg/dL (ref 0.0–0.3)
TOTAL PROTEIN: 8.9 g/dL — AB (ref 6.0–8.3)
Total Bilirubin: 0.7 mg/dL (ref 0.3–1.2)

## 2013-09-24 LAB — CBC WITH DIFFERENTIAL/PLATELET
BASOS PCT: 0 % (ref 0–1)
Basophils Absolute: 0 10*3/uL (ref 0.0–0.1)
Eosinophils Absolute: 0.1 10*3/uL (ref 0.0–0.7)
Eosinophils Relative: 1 % (ref 0–5)
HCT: 48.1 % — ABNORMAL HIGH (ref 36.0–46.0)
Hemoglobin: 16.8 g/dL — ABNORMAL HIGH (ref 12.0–15.0)
LYMPHS ABS: 1.8 10*3/uL (ref 0.7–4.0)
Lymphocytes Relative: 11 % — ABNORMAL LOW (ref 12–46)
MCH: 29.5 pg (ref 26.0–34.0)
MCHC: 34.9 g/dL (ref 30.0–36.0)
MCV: 84.5 fL (ref 78.0–100.0)
Monocytes Absolute: 2 10*3/uL — ABNORMAL HIGH (ref 0.1–1.0)
Monocytes Relative: 12 % (ref 3–12)
NEUTROS ABS: 12.7 10*3/uL — AB (ref 1.7–7.7)
NEUTROS PCT: 76 % (ref 43–77)
Platelets: 296 10*3/uL (ref 150–400)
RBC: 5.69 MIL/uL — AB (ref 3.87–5.11)
RDW: 14.2 % (ref 11.5–15.5)
WBC: 16.6 10*3/uL — ABNORMAL HIGH (ref 4.0–10.5)

## 2013-09-24 LAB — URINALYSIS, ROUTINE W REFLEX MICROSCOPIC
Bilirubin Urine: NEGATIVE
Glucose, UA: NEGATIVE mg/dL
HGB URINE DIPSTICK: NEGATIVE
Ketones, ur: NEGATIVE mg/dL
LEUKOCYTES UA: NEGATIVE
Nitrite: NEGATIVE
Protein, ur: NEGATIVE mg/dL
UROBILINOGEN UA: 0.2 mg/dL (ref 0.0–1.0)
pH: 6 (ref 5.0–8.0)

## 2013-09-24 LAB — BASIC METABOLIC PANEL
Anion gap: 15 (ref 5–15)
BUN: 28 mg/dL — ABNORMAL HIGH (ref 6–23)
CHLORIDE: 83 meq/L — AB (ref 96–112)
CO2: 39 mEq/L — ABNORMAL HIGH (ref 19–32)
Calcium: 10.6 mg/dL — ABNORMAL HIGH (ref 8.4–10.5)
Creatinine, Ser: 1.25 mg/dL — ABNORMAL HIGH (ref 0.50–1.10)
GFR calc non Af Amer: 37 mL/min — ABNORMAL LOW (ref >90)
GFR, EST AFRICAN AMERICAN: 43 mL/min — AB (ref >90)
Glucose, Bld: 124 mg/dL — ABNORMAL HIGH (ref 70–99)
POTASSIUM: 3.2 meq/L — AB (ref 3.7–5.3)
Sodium: 137 mEq/L (ref 137–147)

## 2013-09-24 LAB — LIPASE, BLOOD: Lipase: 36 U/L (ref 11–59)

## 2013-09-24 MED ORDER — SODIUM CHLORIDE 0.9 % IV SOLN
Freq: Once | INTRAVENOUS | Status: AC
  Administered 2013-09-24: 14:00:00 via INTRAVENOUS

## 2013-09-24 MED ORDER — SODIUM CHLORIDE 0.9 % IV BOLUS (SEPSIS): 500.0000 mL | Freq: Once | INTRAVENOUS | Status: DC

## 2013-09-24 MED ORDER — IOHEXOL 300 MG/ML  SOLN
100.0000 mL | Freq: Once | INTRAMUSCULAR | Status: AC | PRN
  Administered 2013-09-24: 100 mL via INTRAVENOUS

## 2013-09-24 MED ORDER — POTASSIUM CHLORIDE CRYS ER 20 MEQ PO TBCR
40.0000 meq | EXTENDED_RELEASE_TABLET | Freq: Once | ORAL | Status: AC
  Administered 2013-09-24: 40 meq via ORAL
  Filled 2013-09-24: qty 2

## 2013-09-24 NOTE — Discharge Instructions (Signed)

## 2013-09-24 NOTE — ED Notes (Signed)
Discharge instructions reviewed with pt, questions answered. Pt verbalized understanding.  

## 2013-09-24 NOTE — ED Provider Notes (Signed)
CSN: [id number]     Arrival date & time 09/24/13  1311 History  This chart was scribed for Gilda Creasehristopher J. Jeannett Dekoning, * by Elon SpannerGarrett Cook, ED Scribe. This patient was seen in room [facility]04/[facility]04 and the patient's care was started at 1:27 PM.    Chief Complaint  Patient presents with  . Fatigue    The history is provided by the patient. No language interpreter was used.    HPI Comments: Lori Bishop is a 80 y.o. female with a history of choledocholithiasis, cholecystectomy, and bile duct stones who presents to the Emergency Department complaining of constant right-sided abdominal pain that began yesterday.  She has associated generalized weakness, nausea, and has decreased appetite.  She states that this episode of pain is similar to her previous episodes of gall stones, but less severe.  Patient denies vomiting, diarrhea, constipation, fever, dysuria, chest pain, dyspnea, SOB.    Past Medical History  Diagnosis Date  . Gout   . Osteoarthritis   . Leg cramps   . Choledocholithiasis 3 2014 and 5 2014    S/P ERCP and sphincterotomy x2   Past Surgical History  Procedure Laterality Date  . Cholecystectomy    . Appendectomy    . Cataract extraction Bilateral   . Wrist arthroplasty Right   . Right femur      metal plate  . Breast biopsy Left   . Ercp  05/23/2012    RMR: juxta-ampullary duodenal diverticulum, markedly dilated biliary tree with choledocholithiasis, s/p sphincterotomy with biliary sphincterotomy balloon dilation, balloon and basket stone extraction, stent placement  . Removal of stones  05/23/2012    Procedure: BALLOON AND BASKET REMOVAL OF STONES;  Surgeon: Corbin Adeobert M Rourk, MD;  Location: [facility];  Service: Endoscopy;;  . Sphincterotomy  05/23/2012    Procedure: Dennison MascotSPHINCTEROTOMY;  Surgeon: Corbin Adeobert M Rourk, MD;  Location: [facility];  Service: Endoscopy;;  . Biliary stent placement  05/23/2012    Procedure: BILIARY STENT PLACEMENT;  Surgeon: Corbin Adeobert M Rourk, MD;  Location: [facility];  Service:  Endoscopy;;  . Balloon dilation  05/23/2012    Procedure: AMPULLARY BALLOON DILATION;  Surgeon: Corbin Adeobert M Rourk, MD;  Location: [facility];  Service: Endoscopy;;  . Ercp N/A 07/24/2012    WGN:[id number]:Juxta-ampullary duodenal diverticulum/s/p dilation sphincterotomy site s/p basket stone extraction s/p cholangioscopy with holmium lithotripsy of common duct stones  . Spyglass cholangioscopy N/A 07/24/2012    Procedure: GEXBMWUXSPYGLASS CHOLANGIOSCOPY;  Surgeon: Corbin Adeobert M Rourk, MD;  Location: [facility];  Service: Endoscopy;  Laterality: N/A;  . Spyglass lithotripsy N/A 07/24/2012    Procedure: LKGMWNUUSPYGLASS LITHOTRIPSY;  Surgeon: Corbin Adeobert M Rourk, MD;  Location: [facility];  Service: Endoscopy;  Laterality: N/A;  . Removal of stones N/A 07/24/2012    Procedure: REMOVAL OF STONES;  Surgeon: Corbin Adeobert M Rourk, MD;  Location: [facility];  Service: Endoscopy;  Laterality: N/A;  . Balloon dilation N/A 07/24/2012    Procedure: BALLOON DILATION; Balloon basket dredge and balloon dialate sphincterotomy;  Surgeon: Corbin Adeobert M Rourk, MD;  Location: [facility];  Service: Endoscopy;  Laterality: N/A;  . Holmium laser application N/A 07/24/2012    Procedure: HOLMIUM LASER APPLICATION;  Surgeon: Corbin Adeobert M Rourk, MD;  Location: [facility];  Service: Endoscopy;  Laterality: N/A;  . Ercp N/A 02/17/2013    Dr. Jena Gaussourk: markedly dilated biliary tree with choledocholithiasis, s/p sphincterotomy and extraction  . Stone extraction with basket N/A 02/17/2013    Procedure: STONE EXTRACTION WITH BASKET and BALLOON;  Surgeon: Corbin Adeobert M Rourk, MD;  Location: [facility];  Service: Endoscopy;  Laterality: N/A;  . Balloon dilation N/A 02/17/2013    Procedure: BALLOON DILATION;  Surgeon: Corbin Ade, MD;  Location: [facility];  Service: Endoscopy;  Laterality: N/A;   Family History  Problem Relation Age of Onset  . Stroke Father   . CAD Mother   . Cancer Son     ?metastatic   History  Substance Use Topics  . Smoking status: Former Smoker -- 0.25 packs/day for 15 years    Types:  Cigarettes    Quit date: 07/17/1988  . Smokeless tobacco: Not on file  . Alcohol Use: No   OB History   Grav Para Term Preterm Abortions TAB SAB Ect Mult Living                 Review of Systems  Constitutional: Positive for appetite change and fatigue. Negative for fever.  Respiratory: Negative for shortness of breath.   Cardiovascular: Negative for chest pain.  Gastrointestinal: Positive for abdominal pain. Negative for nausea, vomiting, diarrhea and constipation.  Genitourinary: Negative for dysuria.  All other systems reviewed and are negative.     Allergies  Review of patient's allergies indicates no known allergies.  Home Medications   Prior to Admission medications   Medication Sig Start Date End Date Taking? Authorizing Provider  allopurinol (ZYLOPRIM) 100 MG tablet Take 100 mg by mouth daily.   Yes Historical Provider, MD  felodipine (PLENDIL) 10 MG 24 hr tablet Take 10 mg by mouth daily.   Yes Historical Provider, MD  fentaNYL (DURAGESIC - DOSED MCG/HR) 100 MCG/HR Place 1 patch onto the skin every 3 (three) days.   Yes Historical Provider, MD  furosemide (LASIX) 40 MG tablet Take 60 mg by mouth 2 (two) times daily.   Yes Historical Provider, MD  metolazone (ZAROXOLYN) 2.5 MG tablet Take 1 tablet by mouth daily. 08/10/13  Yes Historical Provider, MD  potassium chloride SA (K-DUR,KLOR-CON) 20 MEQ tablet Take 40 mEq by mouth daily.    Yes Historical Provider, MD  traMADol-acetaminophen (ULTRACET) 37.5-325 MG per tablet Take 1 tablet by mouth 3 (three) times daily as needed for pain.   Yes Historical Provider, MD  ursodiol (ACTIGALL) 300 MG capsule Take 1 capsule (300 mg total) by mouth 2 (two) times daily. 02/19/13  Yes Avon Gully, MD  polyethylene glycol (MIRALAX / GLYCOLAX) packet Take 17 g by mouth daily as needed for moderate constipation.    Historical Provider, MD   BP 114/56  Pulse 82  Temp(Src) 98.8 F (37.1 C) (Oral)  Resp 18  Ht 5' (1.524 m)  Wt 135 lb  (61.236 kg)  BMI 26.37 kg/m2  SpO2 99% Physical Exam  Nursing note and vitals reviewed. Constitutional: She is oriented to person, place, and time. She appears well-developed and well-nourished. No distress.  HENT:  Head: Normocephalic and atraumatic.  Right Ear: Hearing normal.  Left Ear: Hearing normal.  Nose: Nose normal.  Mouth/Throat: Oropharynx is clear and moist and mucous membranes are normal.  Eyes: Conjunctivae and EOM are normal. Pupils are equal, round, and reactive to light.  Neck: Normal range of motion. Neck supple.  Cardiovascular: Normal rate, regular rhythm, S1 normal, S2 normal and normal heart sounds.  Exam reveals no gallop and no friction rub.   No murmur heard. Pulmonary/Chest: Effort normal and breath sounds normal. No respiratory distress. She exhibits no tenderness.  Abdominal: Soft. Normal appearance and bowel sounds are normal. There is no hepatosplenomegaly. There is tenderness.  There is no rebound, no guarding, no tenderness at McBurney's point and negative Murphy's sign. No hernia.  Slightly tender in LUQ.  More tender in epigastric.  Very tender in RUQ  Musculoskeletal: Normal range of motion.  Trace pitting edema in BLE.  Neurological: She is alert and oriented to person, place, and time. She has normal strength. No cranial nerve deficit or sensory deficit. Coordination normal. GCS eye subscore is 4. GCS verbal subscore is 5. GCS motor subscore is 6.  Skin: Skin is warm, dry and intact. No rash noted. No cyanosis.  Psychiatric: She has a normal mood and affect. Her speech is normal and behavior is normal. Thought content normal.    ED Course  Procedures (including critical care time)  DIAGNOSTIC STUDIES: Oxygen Saturation is 96% on RA, adequate by my interpretation.    COORDINATION OF CARE:  1:34 PM Discussed plans to order labs.  Patient acknowledges and agrees with plan.    Labs Review Labs Reviewed  CBC WITH DIFFERENTIAL - Abnormal; Notable  for the following:    WBC 16.6 (*)    RBC 5.69 (*)    Hemoglobin 16.8 (*)    HCT 48.1 (*)    Neutro Abs 12.7 (*)    Lymphocytes Relative 11 (*)    Monocytes Absolute 2.0 (*)    All other components within normal limits  BASIC METABOLIC PANEL - Abnormal; Notable for the following:    Potassium 3.2 (*)    Chloride 83 (*)    CO2 39 (*)    Glucose, Bld 124 (*)    BUN 28 (*)    Creatinine, Ser 1.25 (*)    Calcium 10.6 (*)    GFR calc non Af Amer 37 (*)    GFR calc Af Amer 43 (*)    All other components within normal limits  URINALYSIS, ROUTINE W REFLEX MICROSCOPIC - Abnormal; Notable for the following:    Specific Gravity, Urine <1.005 (*)    All other components within normal limits  HEPATIC FUNCTION PANEL - Abnormal; Notable for the following:    Total Protein 8.9 (*)    All other components within normal limits  LIPASE, BLOOD    Imaging Review Ct Abdomen Pelvis W Contrast  09/24/2013   CLINICAL DATA:  Upper abdominal pain. History of recurrent common bile duct stones. Evaluate for recurrent stones/ductal dilatation.  EXAM: CT ABDOMEN AND PELVIS WITH CONTRAST  TECHNIQUE: Multidetector CT imaging of the abdomen and pelvis was performed using the standard protocol following bolus administration of intravenous contrast.  CONTRAST:  OMNIPAQUE IOHEXOL 300 MG/ML  SOLN  COMPARISON:  Prior CT from 02/14/2013  FINDINGS: The visualized lung bases are clear.  The liver demonstrates a normal contrast enhanced appearance.  Scattered foci of pneumobilia present within the left hepatic lobe, likely related to prior sphincterotomy. Gallbladder is absent. The common bile duct measures up to 8 mm and smoothly tapers to the level of the sphincter of Oddi. No radiopaque stones seen within the common bile duct. There is no pancreatic ductal dilatation. Intrahepatic biliary dilatation also noted within left hepatic lobe. Overall common the degree of biliary dilatation is markedly improved relative to  most recent CT from 02/14/2013.  Spleen within normal limits. Adrenal glands and pancreas are unremarkable.  Kidneys are equal in size with symmetric enhancement. No nephrolithiasis, hydronephrosis, or focal enhancing renal mass. Subcentimeter hypodense lesion noted within the upper pole left kidney, too small the characterize, but statistically likely a small cyst. Focal  atrophy noted at the inferior aspect of the right kidney.  Small hiatal hernia noted. Stomach within normal limits. No evidence of bowel obstruction. No abnormal wall thickening, mucosal enhancement, or inflammatory fat stranding seen about the bowels. No evidence for acute appendicitis.  Small fat containing paraumbilical hernia noted.  Bladder within normal limits. Uterus and ovaries grossly unremarkable.  No free air or fluid. No pathologically enlarged intra-abdominal pelvic lymph nodes. Advanced calcified atheromatous disease seen within the intra-abdominal aorta which is also torturous. No intra-abdominal aneurysm.  Severe scoliosis noted. No acute osseous abnormality. No worrisome lytic or blastic osseous lesions. Right total hip arthroplasty in place.  IMPRESSION: 1. Common bile duct within normal limits measuring up to 8 mm in diameter and smoothly tapering to the level of the sphincter of Oddi. No CT evidence of obstructive choledocholithiasis. 2. Pneumobilia with intrahepatic biliary dilatation within the left hepatic lobe, likely related to prior sphincterotomy. Overall, the degree of intrahepatic biliary dilatation is improved relative to prior CT from 02/14/2013. 3. No other acute intra-abdominal or pelvic process. 4. Small fat containing paraumbilical hernia. 5. Severe scoliosis with associated multilevel degenerative disc disease.   Electronically Signed   By: Rise Mu M.D.   On: 09/24/2013 17:43     EKG Interpretation None      MDM   Final diagnoses:  Abdominal pain, acute  Hypokalemia    Presented to the  ER for evaluation of generalized weakness, and poor PO intake and abdominal pain. Patient had a quadrant and epigastric pain and discomfort. Examination revealed tenderness in these regions upon arrival.  The patient's history reveals that she has had 3 separate episodes of choledocholithiasis in the last year. Recurrent stone was therefore considered. Patient's blood work did not show any elevated LFTs or lipase. She did have leukocytosis of unclear etiology. She appeared to be somewhat hemoconcentrated. In addition to elevated WBC and hemoglobin, patient has elevated BUN and creatinine above her baseline. She very slight hypokalemia. Patient does fill a prescription for potassium but has not started it yet. She was given 40 by mouth here in the ER.  Case discussed with Doctor Jena Gauss, her gastroenterologist. He recommended CT scan with contrast as the best imaging study to rule out recurrent choledocholithiasis. This CAT scan was performed. No acute abnormality was seen. Patient does not have any evidence of recurrent stone or common bile duct dilatation. She has chronic changes of pneumobilia, improved from previous. Examination of the patient reveals that her pain is resolved. Likewise, tenderness has also resolved. Palpation in the right upper quadrant epigastric region is now nontender. Based on the fact that her CT scan was normal and her pain has completely resolved, it is felt reasonable for the patient to be discharged. She will followup with her primary care physician for repeat blood work to evaluate her hyponatremia as well as elevated BUN and creatinine. She will also followup with Doctor Rourk. She and her family were told to come back to the ER immediatelt if she has any recurrence of the pain.  I personally performed the services described in this documentation, which was scribed in my presence. The recorded information has been reviewed and is accurate.    Gilda Crease,  MD 09/24/13 [phone]

## 2013-09-24 NOTE — ED Notes (Signed)
Pt states she is weak, hands feel numb and she is not eating or drinking like she should. Also, nauseated and pain in right side. Denies fever or vomiting

## 2013-10-30 ENCOUNTER — Ambulatory Visit: Payer: Medicare Other | Admitting: Gastroenterology

## 2013-10-30 ENCOUNTER — Encounter: Payer: Self-pay | Admitting: Neurology

## 2013-10-30 ENCOUNTER — Ambulatory Visit (INDEPENDENT_AMBULATORY_CARE_PROVIDER_SITE_OTHER): Payer: Medicare Other | Admitting: Neurology

## 2013-10-30 VITALS — BP 151/78 | HR 60

## 2013-10-30 DIAGNOSIS — G609 Hereditary and idiopathic neuropathy, unspecified: Secondary | ICD-10-CM

## 2013-10-30 DIAGNOSIS — R209 Unspecified disturbances of skin sensation: Secondary | ICD-10-CM

## 2013-10-30 DIAGNOSIS — R202 Paresthesia of skin: Secondary | ICD-10-CM

## 2013-10-30 MED ORDER — GABAPENTIN 100 MG PO CAPS: 300.0000 mg | ORAL_CAPSULE | Freq: Three times a day (TID) | ORAL | Status: DC

## 2013-10-30 NOTE — Patient Instructions (Addendum)
Overall you are doing fairly well but I do want to suggest a few things today:   Remember to drink plenty of fluid, eat healthy meals and do not skip any meals. Try to eat protein with a every meal and eat a healthy snack such as fruit or nuts in between meals. Try to keep a regular sleep-wake schedule and try to exercise daily, particularly in the form of walking, 20-30 minutes a day, if you can.   As far as diagnostic testing: EMG/NCS  Medications: Start with  gabapentin (neurontin) 3 times daily. Every 2 days can add one cap until taking  (3 pills) 3 times daily as tolerated. Common side effects include drowsiness, dizziness.   I would like to see you back in 3 months, sooner if we need to. Please call us with any interim questions, concerns, problems, updates or refill requests.   Please also call us for any test results so we can go over those with you on the phone.  My clinical assistant and will answer any of your questions and relay your messages to me and also relay most of my messages to you.   Our phone number is [phone]. We also have an after hours call service for urgent matters and there is a physician on-call for urgent questions. For any emergencies you know to call 911 or go to the nearest emergency room

## 2013-10-30 NOTE — Progress Notes (Addendum)
[facility]    Provider:  Dr Lucia Gaskins Referring Provider: Avon Gully, MD Primary Care Physician:  Avon Gully, MD  CC:  Pain in hands  HPI:  Lori Bishop is a 80 y.o. female here as a referral from Dr. Felecia Shelling for pain in hands  [name] She can't grip anything, can't pick up anythng, fingers are numb. Can't hold a pencil or cups. From the elbows down it stays cold. Feelings are extremely uncomfortable. No neck pain. She has some left shoulder pain. No radicular symptoms. She reports significant numbness. If she runs hot water she can't feel it. Been going on for at least 10 years, had right CTS release. This is different than the CTS and worsening over the last month. Wakes her up at night with numbness. Right toes feels numb. No diabetes. No history of polyneuropathy. Has joint pain in the knees. The feelings are all day long and don't subside. Balance is poor. No falls.   Review of Systems: Patient complains of symptoms per HPI as well as the following symptoms swelling in legs, easy bruising, feeling cold, joint pain, constipation, achng muscles, decreased energy, restless legs, numbness and weakness. Pertinent negatives per HPI. Otherwise out of a complete 14 system review, and all other reviewed systems are negative.   History   Social History  . Marital Status: Widowed    Spouse Name: N/A    Number of Children: 1  . Years of Education: N/A   Occupational History  . 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 Smoking status: Former Smoker -- 0.25 packs/day for 15 years    Types: Cigarettes    Quit date: 07/17/1988  . Smokeless tobacco: Never Used  . Alcohol Use: No  . Drug Use: No  . Sexual Activity: Yes    Birth Control/ Protection: Post-menopausal   Other Topics Concern  . Not on file   Social History Narrative   Patient is single with one child.   Patient is right handed.   Patient is a Char[employer] fundraiser    Patient drinks 1 cup daily.    Candiss Norse, niece & caregiver, as well as POA             Family History  Problem Relation Age of Onset  . Stroke Father   . CAD Mother   . Cancer Son     ?metastatic    Past Medical History  Diagnosis Date  . Gout   . Osteoarthritis   . Leg cramps   . Choledocholithiasis 3 2014 and 5 2014    S/P ERCP and sphincterotomy x2  . Hypertension     Past Surgical History  Procedure Laterality Date  . Cholecystectomy    . Appendectomy    . Cataract extraction Bilateral   . Wrist arthroplasty Right   . Right femur      metal plate  . Breast biopsy Left   . Ercp  05/23/2012    RMR: juxta-ampullary duodenal diverticulum, markedly dilated biliary tree with choledocholithiasis, s/p sphincterotomy with biliary sphincterotomy balloon dilation, balloon and basket stone extraction, stent placement  . Removal of stones  05/23/2012    Procedure: BALLOON AND BASKET REMOVAL OF STONES;  Surgeon: Corbin Ade, MD;  Location: [facility];  Service: Endoscopy;;  . Sphincterotomy  05/23/2012    Procedure: Dennison Mascot;  Surgeon: Corbin Ade, MD;  Location: [facility];  Service: Endoscopy;;  . Biliary stent placement  05/23/2012    Procedure: BILIARY STENT PLACEMENT;  Surgeon: Corbin Ade, MD;  Location: [facility];  Service: Endoscopy;;  . Balloon dilation  05/23/2012    Procedure: AMPULLARY BALLOON DILATION;  Surgeon: Corbin Ade, MD;  Location: [facility];  Service: Endoscopy;;  . Ercp N/A 07/24/2012    [id number] duodenal diverticulum/s/p dilation sphincterotomy site s/p basket stone extraction s/p cholangioscopy with holmium lithotripsy of common duct stones  . Spyglass cholangioscopy N/A 07/24/2012    Procedure: YNWGNFAO CHOLANGIOSCOPY;  Surgeon: Corbin Ade, MD;  Location: [facility];  Service: Endoscopy;  Laterality: N/A;  . Spyglass lithotripsy N/A 07/24/2012    Procedure: ZHYQMVHQ LITHOTRIPSY;  Surgeon:  Corbin Ade, MD;  Location: [facility];  Service: Endoscopy;  Laterality: N/A;  . Removal of stones N/A 07/24/2012    Procedure: REMOVAL OF STONES;  Surgeon: Corbin Ade, MD;  Location: [facility];  Service: Endoscopy;  Laterality: N/A;  . Balloon dilation N/A 07/24/2012    Procedure: BALLOON DILATION; Balloon basket dredge and balloon dialate sphincterotomy;  Surgeon: Corbin Ade, MD;  Location: [facility];  Service: Endoscopy;  Laterality: N/A;  . Holmium laser application N/A 07/24/2012    Procedure: HOLMIUM LASER APPLICATION;  Surgeon: Corbin Ade, MD;  Location: [facility];  Service: Endoscopy;  Laterality: N/A;  . Ercp N/A 02/17/2013    Dr. Jena Gauss: markedly dilated biliary tree with choledocholithiasis, s/p sphincterotomy and extraction  . Stone extraction with basket N/A 02/17/2013    Procedure: STONE EXTRACTION WITH BASKET and BALLOON;  Surgeon: Corbin Ade, MD;  Location: [facility];  Service: Endoscopy;  Laterality: N/A;  . Balloon dilation N/A 02/17/2013    Procedure: BALLOON DILATION;  Surgeon: Corbin Ade, MD;  Location: [facility];  Service: Endoscopy;  Laterality: N/A;  . Carpal tunnel release Right     Current Outpatient Prescriptions  Medication Sig Dispense Refill  . allopurinol (ZYLOPRIM) 100 MG tablet Take 100 mg by mouth daily.      . felodipine (PLENDIL) 10 MG 24 hr tablet Take 10 mg by mouth daily.      . fentaNYL (DURAGESIC - DOSED MCG/HR) 100 MCG/HR Place 1 patch onto the skin every 3 (three) days.      . furosemide (LASIX) 40 MG tablet Take 60 mg by mouth 2 (two) times daily.      . metolazone (ZAROXOLYN) 2.5 MG tablet Take 1 tablet by mouth daily.      . polyethylene glycol (MIRALAX / GLYCOLAX) packet Take 17 g by mouth daily as needed for moderate constipation.      . potassium chloride SA (K-DUR,KLOR-CON) 20 MEQ tablet Take 40 mEq by mouth daily.       . traMADol-acetaminophen (ULTRACET) 37.5-325 MG per tablet Take 1 tablet by mouth 3 (three) times daily as needed for  pain.      . ursodiol (ACTIGALL) 300 MG capsule Take 1 capsule (300 mg total) by mouth 2 (two) times daily.  60 capsule  3   No current facility-administered medications for this visit.    Allergies as of 10/30/2013  . (No Known Allergies)    Vitals: BP 151/78  Pulse 60 Last Weight:  Wt Readings from Last 1 Encounters:  09/24/13 135 lb (61.236 kg)   Last Height:   Ht Readings from Last 1 Encounters:  09/24/13 5' (1.524 m)     Physical exam: Exam: Gen: NAD, conversant Eyes:  anicteric sclerae, moist conjunctivae HENT: Atraumatic, oropharynx clear Neck: Trachea midline; supple,  Lungs: CTA, no wheezing, rales, rhonic                          CV: RRR Abdomen: Soft, non-tender;  Extremities: pitting edema 1+ in the ankles Skin: Normal temperature, no rash,  Psych: Appropriate affect, pleasant  Neuro: Detailed Neurologic Exam  Speech:    Speech is normal; fluent and spontaneous with normal comprehension.   Cognition:    The patient is oriented to person, place, and time; memory intact;  Cranial Nerves:    The pupils are equal, round, and reactive to light.  Extraocular movements are intact. Trigeminal sensation is intact and the muscles of mastication are normal. The face is symmetric. The palate elevates in the midline. Voice is normal. Shoulder shrug is normal. The tongue has normal motion without fasciculations.   Gait:    Wheelchair. Uses walker at home   Motor Observation:  distal atrophy in the hands most pronounced in the FDI   Posture:    Slumped forward in wheelchair  Strength:  Distal weakness in all the intrinsic hand muscles. Mild left triceps weakness. Wrist extensors and fdp bilat 3+/5. Hip flexion 3/5 bilat and 5-/5 HS bilat. Otherwise strength intact.          Vibratory Sensation:     impaired great toes distally  Light Touch:    Normal light touch sensation in upper and lower extremities.   Proprioception:    Impaired great toes  distally  Pin Prick:    Normal sensation to pinprick in upper and lower extremities. No loss in the hands.    Temperature: Impaired in a glove and stocking distribution  Reflex Exam:  DTR's:    Absent left patellar. Absent ankle jerks. brisk right patellar. Uppers intact. Toes:    The toes are downgoing bilaterally.    Assessment:  80 year old female with a pmhx of HTN, lumbar degenerative disease who is here for paresthesias in the hands. Neuro exam with glove and stocking sensory loss. Most likely peripheral neuropathy but will perform EMG/NCS. Neurontin for the pain.   A total of 60 minutes was spent in with this patient. Over half this time was spent on counseling patient on the diagnosis and different therapeutic options available.   Naomie Dean, MD  [facility] [address] [place], [place] [address]  Phone [phone] Fax [phone]   Lesly Dukes Lesly Dukes

## 2013-11-05 ENCOUNTER — Ambulatory Visit (INDEPENDENT_AMBULATORY_CARE_PROVIDER_SITE_OTHER): Payer: Medicare Other | Admitting: Neurology

## 2013-11-05 ENCOUNTER — Encounter (INDEPENDENT_AMBULATORY_CARE_PROVIDER_SITE_OTHER): Payer: Self-pay

## 2013-11-05 DIAGNOSIS — R209 Unspecified disturbances of skin sensation: Secondary | ICD-10-CM

## 2013-11-05 DIAGNOSIS — Z0289 Encounter for other administrative examinations: Secondary | ICD-10-CM

## 2013-11-05 DIAGNOSIS — G609 Hereditary and idiopathic neuropathy, unspecified: Secondary | ICD-10-CM

## 2013-11-05 DIAGNOSIS — R202 Paresthesia of skin: Secondary | ICD-10-CM

## 2013-11-06 ENCOUNTER — Other Ambulatory Visit: Payer: Self-pay | Admitting: Neurology

## 2013-11-06 DIAGNOSIS — G5603 Carpal tunnel syndrome, bilateral upper limbs: Secondary | ICD-10-CM

## 2013-11-06 NOTE — Progress Notes (Addendum)
  [facility]  Provider:  Dr Lucia Gaskins Referring Provider: Avon Gully, MD Primary Care Physician:  Avon Gully, MD   History: Lori Bishop is a 80 y.o. female here as a referral from Dr. Felecia Shelling for pain in hands. She reports weakness of grip and paresthesias. She can't grip anything, can't pick up anythng, fingers are numb. Can't hold a pencil or cups. From the elbows down it stays cold. No neck pain. No radicular symptoms. She reports significant numbness. If she runs hot water she can't feel it. Been going on for at least 10 years, had right CTS release years ago. Worsening over the last month. Wakes her up at night with numbness. Right toes feels numb. No diabetes. No history of polyneuropathy. Has joint pain in the knees. The feelings are all day long and don't subside. Balance is poor. No falls.   Summary: The right median motor nerve showed prolonged distal onset latency (5.4 ms, N < 4.31ms) with normal F wave latency.  The left median motor nerve showed prolonged distal onset latency (6.5 ms, N < 4.61ms), reduced amplitude(0.9mV, N>3) and absent F wave. The right Median 2nd Digit sensory nerve showed prolonged distal peak latency (5.5 ms, N < 3.72ms). The left Median 2nd Digit sensory nerve showed no response.   The bilateral Ulnar motor and sensory nerves showed normal conductions with normal F wave latencies. The bilateral Sural sensory nerves showed normal conductions.   EMG needle study was performed on selected muscles. The left Opponens Pollicis muscle showed moderately increased spontaneous activity. The following muscles were normal: right opponens pollicis, left Deltoid, Left Triceps, left Pronator Teres, left First Dorsal Interosseous muscles.   Conclusion: There is electrophysiologic evidence for severe left Carpal Tunnel Syndrome and moderately-severe right Carpal Tunnel Syndrome. No indication of ulnar neuropathy, polyneuropathy or cervical radiculopathy.  Clinical correlation recommended.    Naomie Dean, MD  [facility] [address] [place], [place] [address]  Phone [phone] Fax [phone] Lesly Dukes

## 2013-11-11 ENCOUNTER — Telehealth: Payer: Self-pay | Admitting: Neurology

## 2013-11-11 NOTE — Telephone Encounter (Signed)
Patient was instructed to call back if they had not heard back from the hand center. The hand center said they were waiting for the referral. Please call to let them know what the status of the referral is. Best number to call is  [phone]

## 2013-11-12 NOTE — Telephone Encounter (Signed)
Tried calling patient about referral, no answer not able to leave message. Referral was done by Judeth Cornfield, had several failed attempts at faxing information to the [facility], so it has been mailed out to the Hand center.

## 2013-11-17 NOTE — Telephone Encounter (Signed)
Called patient again, spoke with her niece, she informed me that [facility] has contacted them already and appointment is scheduled for next week with Dr. Jerene Canny.

## 2013-11-24 ENCOUNTER — Telehealth: Payer: Self-pay | Admitting: *Deleted

## 2013-11-24 NOTE — Telephone Encounter (Signed)
Calling to relay that fax # not working use [phone] instead.

## 2013-11-24 NOTE — Telephone Encounter (Signed)
Called Dr Merlyn Lot office they received the records.

## 2014-02-02 ENCOUNTER — Ambulatory Visit (INDEPENDENT_AMBULATORY_CARE_PROVIDER_SITE_OTHER): Payer: Medicare Other | Admitting: Neurology

## 2014-02-02 ENCOUNTER — Encounter: Payer: Self-pay | Admitting: Neurology

## 2014-02-02 VITALS — BP 132/75 | HR 76 | Wt 130.0 lb

## 2014-02-02 DIAGNOSIS — R292 Abnormal reflex: Secondary | ICD-10-CM

## 2014-02-02 DIAGNOSIS — G5600 Carpal tunnel syndrome, unspecified upper limb: Secondary | ICD-10-CM | POA: Insufficient documentation

## 2014-02-02 DIAGNOSIS — R29898 Other symptoms and signs involving the musculoskeletal system: Secondary | ICD-10-CM | POA: Insufficient documentation

## 2014-02-02 DIAGNOSIS — Z736 Limitation of activities due to disability: Secondary | ICD-10-CM

## 2014-02-02 DIAGNOSIS — M6289 Other specified disorders of muscle: Secondary | ICD-10-CM

## 2014-02-02 DIAGNOSIS — G5601 Carpal tunnel syndrome, right upper limb: Secondary | ICD-10-CM

## 2014-02-02 DIAGNOSIS — G5602 Carpal tunnel syndrome, left upper limb: Secondary | ICD-10-CM

## 2014-02-02 DIAGNOSIS — M62549 Muscle wasting and atrophy, not elsewhere classified, unspecified hand: Secondary | ICD-10-CM

## 2014-02-02 NOTE — Patient Instructions (Signed)
Overall you are doing fairly well but I do want to suggest a few things today:   Remember to drink plenty of fluid, eat healthy meals and do not skip any meals. Try to eat protein with a every meal and eat a healthy snack such as fruit or nuts in between meals. Try to keep a regular sleep-wake schedule and try to exercise daily, particularly in the form of walking, 20-30 minutes a day, if you can.   Occupational Therapy  I would like to see you back in 4-6 months, sooner if we need to. Please call us with any interim questions, concerns, problems, updates or refill requests.   Please also call us for any test results so we can go over those with you on the phone.  My clinical assistant and will answer any of your questions and relay your messages to me and also relay most of my messages to you.   Our phone number is [phone]. We also have an after hours call service for urgent matters and there is a physician on-call for urgent questions. For any emergencies you know to call 911 or go to the nearest emergency room

## 2014-02-02 NOTE — Progress Notes (Addendum)
[facility]    Provider:  Dr Lucia GaskinsAhern Referring Provider: Avon GullyFanta, Tesfaye, MD Primary Care Physician:  Avon GullyFANTA,TESFAYE, MD  CC:  Bilateral Hand Pain  HPI:  Lori Bishop is a 80 y.o. female here as a follow up for bilateral hand pain. EMG showed  severe left Carpal Tunnel Syndrome and moderately-severe right Carpal Tunnel Syndrome. She was referred to hand surgeon. She had the hand surgery on the left. She is still having pain on the left hand. Still having difficulty with fine motor and strength., picking things up, cutting with utensils, grip strength.   ROS: blurred vision, constipation, leg swelling, joint pain, back pain, aching muscles, walking difficulty  Previous appt 8/26:  Lori Bishop is a 80 y.o. female here as a referral from Dr. Felecia ShellingFanta for pain in hands  [name] She can't grip anything, can't pick up anythng, fingers are numb. Can't hold a pencil or cups. From the elbows down it stays cold. Feelings are extremely uncomfortable. No neck pain. She has some left shoulder pain. No radicular symptoms. She reports significant numbness. If she runs hot water she can't feel it. Been going on for at least 10 years, had right CTS release. This is different than the CTS and worsening over the last month. Wakes her up at night with numbness. Right toes feels numb. No diabetes. No history of polyneuropathy. Has joint pain in the knees. The feelings are all day long and don't subside. Balance is poor. No falls.   EMG/NCS 11/05/13: There is electrophysiologic evidence for severe left Carpal Tunnel Syndrome and moderately-severe right Carpal Tunnel Syndrome. No indication of ulnar neuropathy, polyneuropathy or cervical radiculopathy. Clinical correlation recommended.    History   Social History  . Marital Status: Widowed    Spouse Name: N/A    Number of Children: 1  . Years of Education:  College   Occupational History  . retired: Charity fundraiser[employer]    Social History Main Topics  . Smoking status: Former Smoker -- 0.25 packs/day for 15 years    Types: Cigarettes    Quit date: 07/17/1988  . Smokeless tobacco: Never Used  . Alcohol Use: No  . Drug Use: No  . Sexual Activity: Yes    Birth Control/ Protection: Post-menopausal   Other Topics Concern  . Not on file   Social History Narrative   Patient is single with one child.   Patient is right handed.   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    Candiss NorseGerdia Bishop, niece & caregiver, as well as POA             Family History  Problem Relation Age of Onset  . Stroke Father   . CAD Mother   . Cancer Son     ?metastatic    Past Medical History  Diagnosis Date  . Gout   . Osteoarthritis   . Leg cramps   . Choledocholithiasis 3 2014 and 5 2014    S/P ERCP and sphincterotomy x2  . Hypertension     Past Surgical History  Procedure Laterality Date  . Cholecystectomy    . Appendectomy    . Cataract extraction Bilateral   . Wrist arthroplasty Right   . Right femur      metal plate  . Breast biopsy Left   . Ercp  05/23/2012    RMR: juxta-ampullary  duodenal diverticulum, markedly dilated biliary tree with choledocholithiasis, s/p sphincterotomy with biliary sphincterotomy balloon dilation, balloon and basket stone extraction, stent placement  . Removal of stones  05/23/2012    Procedure: BALLOON AND BASKET REMOVAL OF STONES;  Surgeon: Corbin Adeobert M Rourk, MD;  Location: [facility];  Service: Endoscopy;;  . Sphincterotomy  05/23/2012    Procedure: Dennison MascotSPHINCTEROTOMY;  Surgeon: Corbin Adeobert M Rourk, MD;  Location: [facility];  Service: Endoscopy;;  . Biliary stent placement  05/23/2012    Procedure: BILIARY STENT PLACEMENT;  Surgeon: Corbin Adeobert M Rourk, MD;  Location: [facility];  Service: Endoscopy;;  . Balloon dilation  05/23/2012    Procedure: AMPULLARY BALLOON DILATION;  Surgeon: Corbin Adeobert M Rourk, MD;  Location: [facility];  Service: Endoscopy;;  .  Ercp N/A 07/24/2012    WUJ:[id number]:Juxta-ampullary duodenal diverticulum/s/p dilation sphincterotomy site s/p basket stone extraction s/p cholangioscopy with holmium lithotripsy of common duct stones  . Spyglass cholangioscopy N/A 07/24/2012    Procedure: HYQMVHQISPYGLASS CHOLANGIOSCOPY;  Surgeon: Corbin Adeobert M Rourk, MD;  Location: [facility];  Service: Endoscopy;  Laterality: N/A;  . Spyglass lithotripsy N/A 07/24/2012    Procedure: ONGEXBMWSPYGLASS LITHOTRIPSY;  Surgeon: Corbin Adeobert M Rourk, MD;  Location: [facility];  Service: Endoscopy;  Laterality: N/A;  . Removal of stones N/A 07/24/2012    Procedure: REMOVAL OF STONES;  Surgeon: Corbin Adeobert M Rourk, MD;  Location: [facility];  Service: Endoscopy;  Laterality: N/A;  . Balloon dilation N/A 07/24/2012    Procedure: BALLOON DILATION; Balloon basket dredge and balloon dialate sphincterotomy;  Surgeon: Corbin Adeobert M Rourk, MD;  Location: [facility];  Service: Endoscopy;  Laterality: N/A;  . Holmium laser application N/A 07/24/2012    Procedure: HOLMIUM LASER APPLICATION;  Surgeon: Corbin Adeobert M Rourk, MD;  Location: [facility];  Service: Endoscopy;  Laterality: N/A;  . Ercp N/A 02/17/2013    Dr. Jena Gaussourk: markedly dilated biliary tree with choledocholithiasis, s/p sphincterotomy and extraction  . Stone extraction with basket N/A 02/17/2013    Procedure: STONE EXTRACTION WITH BASKET and BALLOON;  Surgeon: Corbin Adeobert M Rourk, MD;  Location: [facility];  Service: Endoscopy;  Laterality: N/A;  . Balloon dilation N/A 02/17/2013    Procedure: BALLOON DILATION;  Surgeon: Corbin Adeobert M Rourk, MD;  Location: [facility];  Service: Endoscopy;  Laterality: N/A;  . Carpal tunnel release Right     Current Outpatient Prescriptions  Medication Sig Dispense Refill  . allopurinol (ZYLOPRIM) 100 MG tablet Take 100 mg by mouth daily.    . felodipine (PLENDIL) 10 MG 24 hr tablet Take 10 mg by mouth daily.    . fentaNYL (DURAGESIC - DOSED MCG/HR) 100 MCG/HR Place 1 patch onto the skin every 3 (three) days.    . furosemide (LASIX) 40 MG tablet Take 60 mg  by mouth 2 (two) times daily.    Marland Kitchen. gabapentin (NEURONTIN) 100 MG capsule Take 3 capsules (300 mg total) by mouth 3 (three) times daily. 270 capsule 3  . metolazone (ZAROXOLYN) 2.5 MG tablet Take 1 tablet by mouth daily.    . polyethylene glycol (MIRALAX / GLYCOLAX) packet Take 17 g by mouth daily as needed for moderate constipation.    . potassium chloride SA (K-DUR,KLOR-CON) 20 MEQ tablet Take 40 mEq by mouth daily.     . traMADol-acetaminophen (ULTRACET) 37.5-325 MG per tablet Take 1 tablet by mouth 3 (three) times daily as needed for pain.    . ursodiol (ACTIGALL) 300 MG capsule Take 1 capsule (300 mg total) by mouth 2 (two) times daily. 60 capsule 3  No current facility-administered medications for this visit.    Allergies as of 02/02/2014  . (No Known Allergies)    Vitals: BP 132/75 mmHg  Pulse 76  Ht   Wt 130 lb (58.968 kg) Last Weight:  Wt Readings from Last 1 Encounters:  02/02/14 130 lb (58.968 kg)   Last Height:   Ht Readings from Last 1 Encounters:  09/24/13 5' (1.524 m)      Review of Systems: Patient complains of symptoms per HPI as well as the following symptoms: leg swelling, blurred vision, constipation, joint pain, joint swelling, back pain, aching muscles, walking difficulty, numbness. Pertinent negatives per HPI. All others negative.  Physical exam: Exam: Gen: NAD, conversant  CV: RRR Psych: Appropriate affect, pleasant  Neuro: Detailed Neurologic Exam  Speech:  Speech is normal; fluent and spontaneous with normal comprehension.   Cognition:  The patient is oriented to person, place, and time; memory intact;  Cranial Nerves:  The pupils are equal, round, and reactive to light. Extraocular movements are intact. Trigeminal sensation is intact and the muscles of mastication are normal. The face is symmetric. The palate elevates in the midline. Voice is normal. Shoulder shrug is normal. The tongue has normal motion without  fasciculations.   Gait:  Wheelchair. Cannot ambulate independently. Dependent on assistance to leave the home.    Motor Observation: distal atrophy in the hands most pronounced in the FDI   Posture:  Slumped forward in wheelchair  Strength:  Distal weakness in all the intrinsic hand muscles. Mild left triceps weakness. Wrist extensors and fdp bilat 3+/5. Hip flexion 3/5 bilat and 5-/5 HS bilat. Otherwise strength intact.     Vibratory Sensation:   impaired great toes distally  Light Touch:  Normal light touch sensation in upper and lower extremities.   Proprioception:  Impaired great toes distally  Pin Prick:  Normal sensation to pinprick in upper and lower extremities. No loss in the hands.    Temperature: Impaired in a glove and stocking distribution  Reflex Exam:  DTR's:  Absent left patellar. Absent ankle jerks. brisk right patellar. Uppers intact. Toes:  The toes are downgoing bilaterally.    Assessment: 80 year old female with a pmhx of HTN, lumbar degenerative disease who is here for follow up after carpal tunnel release in the left hand. She is still experiencing difficulty with grip, having weakness, inability to use hands for daily activities of living. She need OT for hand function and strengthening. She cannot leave the home with assistance, uses a wheelchair, cannot walk more than a hallway with a walker. Will order home OT for hand function. Recommend right carpal tunnel release as soon as possible to avoid more weakness in the right hand as well and give her the best chance for regaining strength. Neurontin prn. Wear wrist braces at night.    Lesly Dukes

## 2014-02-12 ENCOUNTER — Ambulatory Visit (HOSPITAL_COMMUNITY)
Admission: RE | Admit: 2014-02-12 | Discharge: 2014-02-12 | Disposition: A | Discharge status: Home / Self Care | Payer: Medicare Other | Source: Ambulatory Visit | Attending: Internal Medicine | Admitting: Internal Medicine

## 2014-02-12 ENCOUNTER — Other Ambulatory Visit (HOSPITAL_COMMUNITY): Payer: Self-pay | Admitting: Internal Medicine

## 2014-02-12 DIAGNOSIS — J449 Chronic obstructive pulmonary disease, unspecified: Secondary | ICD-10-CM | POA: Diagnosis not present

## 2014-03-15 DIAGNOSIS — M5489 Other dorsalgia: Secondary | ICD-10-CM | POA: Diagnosis not present

## 2014-03-15 DIAGNOSIS — R600 Localized edema: Secondary | ICD-10-CM | POA: Diagnosis not present

## 2014-03-15 DIAGNOSIS — M159 Polyosteoarthritis, unspecified: Secondary | ICD-10-CM | POA: Diagnosis not present

## 2014-03-15 DIAGNOSIS — I10 Essential (primary) hypertension: Secondary | ICD-10-CM | POA: Diagnosis not present

## 2014-03-16 DIAGNOSIS — G5602 Carpal tunnel syndrome, left upper limb: Secondary | ICD-10-CM | POA: Diagnosis not present

## 2014-03-24 DIAGNOSIS — I10 Essential (primary) hypertension: Secondary | ICD-10-CM | POA: Diagnosis not present

## 2014-03-24 DIAGNOSIS — M25561 Pain in right knee: Secondary | ICD-10-CM | POA: Diagnosis not present

## 2014-03-24 DIAGNOSIS — M25562 Pain in left knee: Secondary | ICD-10-CM | POA: Diagnosis not present

## 2014-03-26 ENCOUNTER — Encounter (HOSPITAL_COMMUNITY): Payer: Self-pay | Admitting: Emergency Medicine

## 2014-03-26 ENCOUNTER — Emergency Department (HOSPITAL_COMMUNITY)
Admission: EM | Admit: 2014-03-26 | Discharge: 2014-03-26 | Disposition: A | Discharge status: Home / Self Care | Payer: Medicare Other | Attending: Emergency Medicine | Admitting: Emergency Medicine

## 2014-03-26 DIAGNOSIS — Z87891 Personal history of nicotine dependence: Secondary | ICD-10-CM | POA: Insufficient documentation

## 2014-03-26 DIAGNOSIS — Z79899 Other long term (current) drug therapy: Secondary | ICD-10-CM | POA: Diagnosis not present

## 2014-03-26 DIAGNOSIS — H6123 Impacted cerumen, bilateral: Secondary | ICD-10-CM

## 2014-03-26 DIAGNOSIS — M109 Gout, unspecified: Secondary | ICD-10-CM | POA: Diagnosis not present

## 2014-03-26 DIAGNOSIS — H9203 Otalgia, bilateral: Secondary | ICD-10-CM | POA: Diagnosis present

## 2014-03-26 DIAGNOSIS — Z8719 Personal history of other diseases of the digestive system: Secondary | ICD-10-CM | POA: Insufficient documentation

## 2014-03-26 DIAGNOSIS — I10 Essential (primary) hypertension: Secondary | ICD-10-CM | POA: Insufficient documentation

## 2014-03-26 MED ORDER — NEOMYCIN-POLYMYXIN-HC 1 % OT SOLN
3.0000 [drp] | Freq: Once | OTIC | Status: AC
  Administered 2014-03-26: 3 [drp] via OTIC
  Filled 2014-03-26: qty 10

## 2014-03-26 MED ORDER — DOCUSATE SODIUM 50 MG/5ML PO LIQD
50.0000 mg | Freq: Once | ORAL | Status: AC
  Administered 2014-03-26: 50 mg via OTIC
  Filled 2014-03-26: qty 10

## 2014-03-26 MED ORDER — HYDROCODONE-ACETAMINOPHEN 5-325 MG PO TABS: 1.0000 | ORAL_TABLET | ORAL | Status: DC | PRN

## 2014-03-26 NOTE — ED Provider Notes (Signed)
CSN: [id number]     Arrival date & time 03/26/14  4098 History   First MD Initiated Contact with Patient 03/26/14 [phone]     Chief Complaint  Patient presents with  . Otalgia     (Consider location/radiation/quality/duration/timing/severity/associated sxs/prior Treatment) HPI Comments: Patient is a 81 year old female who presents to the emergency department with complaint of right ear pain, and difficulty hearing. The patient's niece reports that the patient has an appointment with Dr. Suszanne Conners next week. The patient called the niece this morning and said she was having increasing pain in the right ear, and the patient was brought to the emergency department for additional evaluation. There's been no high fever. There has been no drainage from the ear.  The history is provided by the patient.    Past Medical History  Diagnosis Date  . Gout   . Osteoarthritis   . Leg cramps   . Choledocholithiasis 3 2014 and 5 2014    S/P ERCP and sphincterotomy x2  . Hypertension    Past Surgical History  Procedure Laterality Date  . Cholecystectomy    . Appendectomy    . Cataract extraction Bilateral   . Wrist arthroplasty Right   . Right femur      metal plate  . Breast biopsy Left   . Ercp  05/23/2012    RMR: juxta-ampullary duodenal diverticulum, markedly dilated biliary tree with choledocholithiasis, s/p sphincterotomy with biliary sphincterotomy balloon dilation, balloon and basket stone extraction, stent placement  . Removal of stones  05/23/2012    Procedure: BALLOON AND BASKET REMOVAL OF STONES;  Surgeon: Corbin Ade, MD;  Location: [facility];  Service: Endoscopy;;  . Sphincterotomy  05/23/2012    Procedure: Dennison Mascot;  Surgeon: Corbin Ade, MD;  Location: [facility];  Service: Endoscopy;;  . Biliary stent placement  05/23/2012    Procedure: BILIARY STENT PLACEMENT;  Surgeon: Corbin Ade, MD;  Location: [facility];  Service: Endoscopy;;  . Balloon dilation  05/23/2012    Procedure:  AMPULLARY BALLOON DILATION;  Surgeon: Corbin Ade, MD;  Location: [facility];  Service: Endoscopy;;  . Ercp N/A 07/24/2012    YNW:[id number] duodenal diverticulum/s/p dilation sphincterotomy site s/p basket stone extraction s/p cholangioscopy with holmium lithotripsy of common duct stones  . Spyglass cholangioscopy N/A 07/24/2012    Procedure: ONGEXBMW CHOLANGIOSCOPY;  Surgeon: Corbin Ade, MD;  Location: [facility];  Service: Endoscopy;  Laterality: N/A;  . Spyglass lithotripsy N/A 07/24/2012    Procedure: UXLKGMWN LITHOTRIPSY;  Surgeon: Corbin Ade, MD;  Location: [facility];  Service: Endoscopy;  Laterality: N/A;  . Removal of stones N/A 07/24/2012    Procedure: REMOVAL OF STONES;  Surgeon: Corbin Ade, MD;  Location: [facility];  Service: Endoscopy;  Laterality: N/A;  . Balloon dilation N/A 07/24/2012    Procedure: BALLOON DILATION; Balloon basket dredge and balloon dialate sphincterotomy;  Surgeon: Corbin Ade, MD;  Location: [facility];  Service: Endoscopy;  Laterality: N/A;  . Holmium laser application N/A 07/24/2012    Procedure: HOLMIUM LASER APPLICATION;  Surgeon: Corbin Ade, MD;  Location: [facility];  Service: Endoscopy;  Laterality: N/A;  . Ercp N/A 02/17/2013    Dr. Jena Gauss: markedly dilated biliary tree with choledocholithiasis, s/p sphincterotomy and extraction  . Stone extraction with basket N/A 02/17/2013    Procedure: STONE EXTRACTION WITH BASKET and BALLOON;  Surgeon: Corbin Ade, MD;  Location: [facility];  Service: Endoscopy;  Laterality: N/A;  . Balloon dilation N/A 02/17/2013  Procedure: BALLOON DILATION;  Surgeon: Corbin Adeobert M Rourk, MD;  Location: [facility];  Service: Endoscopy;  Laterality: N/A;  . Carpal tunnel release Right    Family History  Problem Relation Age of Onset  . Stroke Father   . CAD Mother   . Cancer Son     ?metastatic   History  Substance Use Topics  . Smoking status: Former Smoker -- 0.25 packs/day for 15 years    Types: Cigarettes    Quit date:  07/17/1988  . Smokeless tobacco: Never Used  . Alcohol Use: No   OB History    Gravida Para Term Preterm AB TAB SAB Ectopic Multiple Living   1 1 1        0     Review of Systems  Constitutional: Negative for fever, chills and activity change.       All ROS Neg except as noted in HPI  HENT: Positive for ear pain and hearing loss. Negative for nosebleeds.   Eyes: Negative for photophobia and discharge.  Respiratory: Negative for cough, shortness of breath and wheezing.   Cardiovascular: Negative for chest pain and palpitations.  Gastrointestinal: Negative for abdominal pain and blood in stool.  Genitourinary: Negative for dysuria, frequency and hematuria.  Musculoskeletal: Positive for arthralgias. Negative for neck pain.  Skin: Negative.   Neurological: Negative for dizziness, seizures and speech difficulty.  Psychiatric/Behavioral: Negative for hallucinations and confusion.      Allergies  Review of patient's allergies indicates no known allergies.  Home Medications   Prior to Admission medications   Medication Sig Start Date End Date Taking? Authorizing Provider  allopurinol (ZYLOPRIM) 100 MG tablet Take 100 mg by mouth daily.   Yes Historical Provider, MD  felodipine (PLENDIL) 10 MG 24 hr tablet Take 10 mg by mouth daily.   Yes Historical Provider, MD  fentaNYL (DURAGESIC - DOSED MCG/HR) 100 MCG/HR Place 1 patch onto the skin every 3 (three) days.   Yes Historical Provider, MD  furosemide (LASIX) 40 MG tablet Take 60 mg by mouth 2 (two) times daily.   Yes Historical Provider, MD  gabapentin (NEURONTIN) 100 MG capsule Take 3 capsules (300 mg total) by mouth 3 (three) times daily. 10/30/13  Yes Anson FretAntonia B Ahern, MD  metolazone (ZAROXOLYN) 2.5 MG tablet Take 2.5 mg by mouth daily.  08/10/13  Yes Historical Provider, MD  potassium chloride SA (K-DUR,KLOR-CON) 20 MEQ tablet Take 40 mEq by mouth daily.    Yes Historical Provider, MD  traMADol-acetaminophen (ULTRACET) 37.5-325 MG per  tablet Take 1 tablet by mouth 3 (three) times daily as needed for pain.   Yes Historical Provider, MD  ursodiol (ACTIGALL) 300 MG capsule Take 1 capsule (300 mg total) by mouth 2 (two) times daily. 02/19/13  Yes Avon Gullyesfaye Fanta, MD  polyethylene glycol (MIRALAX / GLYCOLAX) packet Take 17 g by mouth daily as needed for moderate constipation.    Historical Provider, MD   BP 140/67 mmHg  Pulse 71  Temp(Src) 98.6 F (37 C) (Oral)  Resp 16  Ht 5' (1.524 m)  Wt 130 lb (58.968 kg)  BMI 25.39 kg/m2  SpO2 96% Physical Exam  Constitutional: She is oriented to person, place, and time. She appears well-developed and well-nourished.  Non-toxic appearance.  HENT:  Head: Normocephalic.  Right Ear: Tympanic membrane and external ear normal.  Left Ear: Tympanic membrane and external ear normal.  There is bilateral cerumen impaction of the ears. There is no pre-or post a reticular nodes palpated. The oropharynx is  clear.  Eyes: EOM and lids are normal. Pupils are equal, round, and reactive to light.  Neck: Normal range of motion. Neck supple. Carotid bruit is not present.  No cervical lymphadenopathy appreciated.  Cardiovascular: Normal rate, regular rhythm, intact distal pulses and normal pulses.   Murmur heard. Pulmonary/Chest: Breath sounds normal. No respiratory distress.  Abdominal: Soft. Bowel sounds are normal. There is no tenderness. There is no guarding.  Musculoskeletal: Normal range of motion.  Lymphadenopathy:       Head (right side): No submandibular adenopathy present.       Head (left side): No submandibular adenopathy present.    She has no cervical adenopathy.  Neurological: She is alert and oriented to person, place, and time. She has normal strength. No cranial nerve deficit or sensory deficit.  Skin: Skin is warm and dry.  Psychiatric: She has a normal mood and affect. Her speech is normal.  Nursing note and vitals reviewed.   ED Course  Procedures (including critical care  time) Labs Review Labs Reviewed - No data to display  Imaging Review No results found.   EKG Interpretation None      MDM  Patient noted on examination to have cerumen impaction of. There is no mastoid involvement.  After cerumen impaction removed with irrigation of the ears, the tympanic membranes are within normal limits. There is a small scratch area at the opening of the right tympanic membrane. The patient will be treated with Cortisporin otic suspension for this. The patient is advised to use Tylenol for mild pain. Norco has been provided for the patient to use for more severe pain. The patient is encouraged to keep her appointment with the ear nose and throat specialist as scheduled. Patient will return to the emergency department if any changes, problems, or concerns.    Final diagnoses:  Cerumen impaction, bilateral    **I have reviewed nursing notes, vital signs, and all appropriate lab and imaging results for this patient.Kathie Dike, PA-C 03/26/14 1220  Benny Lennert, MD 03/26/14 [phone]

## 2014-03-26 NOTE — ED Notes (Signed)
Pt reports needing to have her ears "flushed out" and the onset of R ear otalgia. Pt waiting to see Dr. Suszanne Connerseoh.

## 2014-03-26 NOTE — ED Notes (Signed)
Pt ears irrigated bilaterally with warm NS. Large amount of cerumen removed from both ears.

## 2014-03-26 NOTE — Discharge Instructions (Signed)
The wax impaction has been removed from both of your ears. You have a scratch in the right ear. Please use 3 drops of Cortisporin ear drops to the right ear 4 times daily for the next 5 days. May use Tylenol for mild pain, may use Norco for more severe pain. Norco may cause drowsiness, please use with caution. Cerumen Impaction A cerumen impaction is when the wax in your ear forms a plug. This plug usually causes reduced hearing. Sometimes it also causes an earache or dizziness. Removing a cerumen impaction can be difficult and painful. The wax sticks to the ear canal. The canal is sensitive and bleeds easily. If you try to remove a heavy wax buildup with a cotton tipped swab, you may push it in further. Irrigation with water, suction, and small ear curettes may be used to clear out the wax. If the impaction is fixed to the skin in the ear canal, ear drops may be needed for a few days to loosen the wax. People who build up a lot of wax frequently can use ear wax removal products available in your local drugstore. SEEK MEDICAL CARE IF:  You develop an earache, increased hearing loss, or marked dizziness. Document Released: 03/29/2004 Document Revised: 05/14/2011 Document Reviewed: 05/19/2009 [facility] Patient Information 2015 [place] [place]. This information is not intended to replace advice given to you by your health care provider. Make sure you discuss any questions you have with your health care provider.

## 2014-03-29 DIAGNOSIS — G5601 Carpal tunnel syndrome, right upper limb: Secondary | ICD-10-CM | POA: Diagnosis not present

## 2014-03-30 MED FILL — Hydrocodone-Acetaminophen Tab 5-325 MG: ORAL | Qty: 6 | Status: AC

## 2014-04-09 ENCOUNTER — Other Ambulatory Visit: Payer: Self-pay | Admitting: Neurology

## 2014-04-14 DIAGNOSIS — R52 Pain, unspecified: Secondary | ICD-10-CM | POA: Diagnosis not present

## 2014-04-14 DIAGNOSIS — I10 Essential (primary) hypertension: Secondary | ICD-10-CM | POA: Diagnosis not present

## 2014-04-14 DIAGNOSIS — M159 Polyosteoarthritis, unspecified: Secondary | ICD-10-CM | POA: Diagnosis not present

## 2014-04-14 DIAGNOSIS — R600 Localized edema: Secondary | ICD-10-CM | POA: Diagnosis not present

## 2014-04-21 DIAGNOSIS — I10 Essential (primary) hypertension: Secondary | ICD-10-CM | POA: Diagnosis not present

## 2014-04-21 DIAGNOSIS — M25561 Pain in right knee: Secondary | ICD-10-CM | POA: Diagnosis not present

## 2014-04-21 DIAGNOSIS — M25562 Pain in left knee: Secondary | ICD-10-CM | POA: Diagnosis not present

## 2014-05-12 DIAGNOSIS — M159 Polyosteoarthritis, unspecified: Secondary | ICD-10-CM | POA: Diagnosis not present

## 2014-05-12 DIAGNOSIS — M5489 Other dorsalgia: Secondary | ICD-10-CM | POA: Diagnosis not present

## 2014-05-12 DIAGNOSIS — N182 Chronic kidney disease, stage 2 (mild): Secondary | ICD-10-CM | POA: Diagnosis not present

## 2014-05-12 DIAGNOSIS — R52 Pain, unspecified: Secondary | ICD-10-CM | POA: Diagnosis not present

## 2014-05-19 DIAGNOSIS — M25561 Pain in right knee: Secondary | ICD-10-CM | POA: Diagnosis not present

## 2014-05-19 DIAGNOSIS — M25562 Pain in left knee: Secondary | ICD-10-CM | POA: Diagnosis not present

## 2014-05-19 DIAGNOSIS — I10 Essential (primary) hypertension: Secondary | ICD-10-CM | POA: Diagnosis not present

## 2014-05-19 DIAGNOSIS — Z6829 Body mass index (BMI) 29.0-29.9, adult: Secondary | ICD-10-CM | POA: Diagnosis not present

## 2014-06-10 ENCOUNTER — Other Ambulatory Visit: Payer: Self-pay | Admitting: Neurology

## 2014-06-11 NOTE — Telephone Encounter (Signed)
I spoke with Lorin PicketScott at the pharmacy.  He said they faxed us in error.  Asked that we disregard this request.

## 2014-06-14 DIAGNOSIS — M1712 Unilateral primary osteoarthritis, left knee: Secondary | ICD-10-CM | POA: Diagnosis not present

## 2014-06-14 DIAGNOSIS — M25561 Pain in right knee: Secondary | ICD-10-CM | POA: Diagnosis not present

## 2014-06-14 DIAGNOSIS — I10 Essential (primary) hypertension: Secondary | ICD-10-CM | POA: Diagnosis not present

## 2014-06-14 DIAGNOSIS — M25562 Pain in left knee: Secondary | ICD-10-CM | POA: Diagnosis not present

## 2014-06-14 DIAGNOSIS — M1711 Unilateral primary osteoarthritis, right knee: Secondary | ICD-10-CM | POA: Diagnosis not present

## 2014-06-16 DIAGNOSIS — R52 Pain, unspecified: Secondary | ICD-10-CM | POA: Diagnosis not present

## 2014-06-16 DIAGNOSIS — M5489 Other dorsalgia: Secondary | ICD-10-CM | POA: Diagnosis not present

## 2014-06-16 DIAGNOSIS — J309 Allergic rhinitis, unspecified: Secondary | ICD-10-CM | POA: Diagnosis not present

## 2014-06-16 DIAGNOSIS — M159 Polyosteoarthritis, unspecified: Secondary | ICD-10-CM | POA: Diagnosis not present

## 2014-06-17 DIAGNOSIS — G5601 Carpal tunnel syndrome, right upper limb: Secondary | ICD-10-CM | POA: Diagnosis not present

## 2014-07-05 ENCOUNTER — Ambulatory Visit: Payer: Medicare Other | Admitting: Neurology

## 2014-07-06 ENCOUNTER — Ambulatory Visit: Payer: Self-pay | Admitting: Neurology

## 2014-07-14 DIAGNOSIS — M5489 Other dorsalgia: Secondary | ICD-10-CM | POA: Diagnosis not present

## 2014-07-14 DIAGNOSIS — R600 Localized edema: Secondary | ICD-10-CM | POA: Diagnosis not present

## 2014-07-14 DIAGNOSIS — M159 Polyosteoarthritis, unspecified: Secondary | ICD-10-CM | POA: Diagnosis not present

## 2014-07-14 DIAGNOSIS — M1711 Unilateral primary osteoarthritis, right knee: Secondary | ICD-10-CM | POA: Diagnosis not present

## 2014-07-14 DIAGNOSIS — Z6828 Body mass index (BMI) 28.0-28.9, adult: Secondary | ICD-10-CM | POA: Diagnosis not present

## 2014-07-14 DIAGNOSIS — M1712 Unilateral primary osteoarthritis, left knee: Secondary | ICD-10-CM | POA: Diagnosis not present

## 2014-07-14 DIAGNOSIS — N182 Chronic kidney disease, stage 2 (mild): Secondary | ICD-10-CM | POA: Diagnosis not present

## 2014-07-14 DIAGNOSIS — I10 Essential (primary) hypertension: Secondary | ICD-10-CM | POA: Diagnosis not present

## 2014-07-15 ENCOUNTER — Ambulatory Visit (INDEPENDENT_AMBULATORY_CARE_PROVIDER_SITE_OTHER): Payer: Medicare Other | Admitting: Otolaryngology

## 2014-07-15 DIAGNOSIS — J310 Chronic rhinitis: Secondary | ICD-10-CM | POA: Diagnosis not present

## 2014-07-15 DIAGNOSIS — J343 Hypertrophy of nasal turbinates: Secondary | ICD-10-CM | POA: Diagnosis not present

## 2014-07-15 DIAGNOSIS — H6121 Impacted cerumen, right ear: Secondary | ICD-10-CM

## 2014-07-15 DIAGNOSIS — H903 Sensorineural hearing loss, bilateral: Secondary | ICD-10-CM

## 2014-07-27 DIAGNOSIS — G5601 Carpal tunnel syndrome, right upper limb: Secondary | ICD-10-CM | POA: Diagnosis not present

## 2014-08-11 DIAGNOSIS — I10 Essential (primary) hypertension: Secondary | ICD-10-CM | POA: Diagnosis not present

## 2014-08-11 DIAGNOSIS — N182 Chronic kidney disease, stage 2 (mild): Secondary | ICD-10-CM | POA: Diagnosis not present

## 2014-08-11 DIAGNOSIS — M159 Polyosteoarthritis, unspecified: Secondary | ICD-10-CM | POA: Diagnosis not present

## 2014-08-11 DIAGNOSIS — M25562 Pain in left knee: Secondary | ICD-10-CM | POA: Diagnosis not present

## 2014-08-11 DIAGNOSIS — M25561 Pain in right knee: Secondary | ICD-10-CM | POA: Diagnosis not present

## 2014-08-11 DIAGNOSIS — M5137 Other intervertebral disc degeneration, lumbosacral region: Secondary | ICD-10-CM | POA: Diagnosis not present

## 2014-08-11 DIAGNOSIS — M1711 Unilateral primary osteoarthritis, right knee: Secondary | ICD-10-CM | POA: Diagnosis not present

## 2014-08-11 DIAGNOSIS — M1712 Unilateral primary osteoarthritis, left knee: Secondary | ICD-10-CM | POA: Diagnosis not present

## 2014-08-31 DIAGNOSIS — G5601 Carpal tunnel syndrome, right upper limb: Secondary | ICD-10-CM | POA: Diagnosis not present

## 2014-09-08 DIAGNOSIS — R600 Localized edema: Secondary | ICD-10-CM | POA: Diagnosis not present

## 2014-09-08 DIAGNOSIS — I10 Essential (primary) hypertension: Secondary | ICD-10-CM | POA: Diagnosis not present

## 2014-09-08 DIAGNOSIS — M1711 Unilateral primary osteoarthritis, right knee: Secondary | ICD-10-CM | POA: Diagnosis not present

## 2014-09-08 DIAGNOSIS — Z6828 Body mass index (BMI) 28.0-28.9, adult: Secondary | ICD-10-CM | POA: Diagnosis not present

## 2014-09-08 DIAGNOSIS — M1712 Unilateral primary osteoarthritis, left knee: Secondary | ICD-10-CM | POA: Diagnosis not present

## 2014-09-14 DIAGNOSIS — M47816 Spondylosis without myelopathy or radiculopathy, lumbar region: Secondary | ICD-10-CM | POA: Diagnosis not present

## 2014-09-14 DIAGNOSIS — M25562 Pain in left knee: Secondary | ICD-10-CM | POA: Diagnosis not present

## 2014-09-14 DIAGNOSIS — M25561 Pain in right knee: Secondary | ICD-10-CM | POA: Diagnosis not present

## 2014-09-14 DIAGNOSIS — Z79891 Long term (current) use of opiate analgesic: Secondary | ICD-10-CM | POA: Diagnosis not present

## 2014-09-14 DIAGNOSIS — M5136 Other intervertebral disc degeneration, lumbar region: Secondary | ICD-10-CM | POA: Diagnosis not present

## 2014-09-16 ENCOUNTER — Other Ambulatory Visit: Payer: Self-pay | Admitting: Neurology

## 2014-09-27 DIAGNOSIS — Z961 Presence of intraocular lens: Secondary | ICD-10-CM | POA: Diagnosis not present

## 2014-10-05 DIAGNOSIS — Z79891 Long term (current) use of opiate analgesic: Secondary | ICD-10-CM | POA: Diagnosis not present

## 2014-10-05 DIAGNOSIS — M170 Bilateral primary osteoarthritis of knee: Secondary | ICD-10-CM | POA: Diagnosis not present

## 2014-10-05 DIAGNOSIS — M1990 Unspecified osteoarthritis, unspecified site: Secondary | ICD-10-CM | POA: Diagnosis not present

## 2014-10-05 DIAGNOSIS — M2550 Pain in unspecified joint: Secondary | ICD-10-CM | POA: Diagnosis not present

## 2014-10-05 DIAGNOSIS — M25561 Pain in right knee: Secondary | ICD-10-CM | POA: Diagnosis not present

## 2014-10-05 DIAGNOSIS — M5136 Other intervertebral disc degeneration, lumbar region: Secondary | ICD-10-CM | POA: Diagnosis not present

## 2014-10-05 DIAGNOSIS — M25562 Pain in left knee: Secondary | ICD-10-CM | POA: Diagnosis not present

## 2014-10-05 DIAGNOSIS — Z79899 Other long term (current) drug therapy: Secondary | ICD-10-CM | POA: Diagnosis not present

## 2014-10-05 DIAGNOSIS — G8929 Other chronic pain: Secondary | ICD-10-CM | POA: Diagnosis not present

## 2014-10-05 DIAGNOSIS — M2240 Chondromalacia patellae, unspecified knee: Secondary | ICD-10-CM | POA: Diagnosis not present

## 2014-10-05 DIAGNOSIS — Z9889 Other specified postprocedural states: Secondary | ICD-10-CM | POA: Diagnosis not present

## 2014-10-05 DIAGNOSIS — M47816 Spondylosis without myelopathy or radiculopathy, lumbar region: Secondary | ICD-10-CM | POA: Diagnosis not present

## 2014-10-18 ENCOUNTER — Other Ambulatory Visit: Payer: Self-pay | Admitting: Neurology

## 2014-11-01 ENCOUNTER — Emergency Department (HOSPITAL_COMMUNITY): Payer: Medicare Other

## 2014-11-01 ENCOUNTER — Ambulatory Visit: Payer: Self-pay | Admitting: Neurology

## 2014-11-01 ENCOUNTER — Encounter (HOSPITAL_COMMUNITY): Payer: Self-pay | Admitting: Emergency Medicine

## 2014-11-01 ENCOUNTER — Inpatient Hospital Stay (HOSPITAL_COMMUNITY)
Admission: EM | Admit: 2014-11-01 | Discharge: 2014-11-16 | DRG: 409 | Disposition: A | Discharge status: Home Health | Payer: Medicare Other | Attending: General Surgery | Admitting: General Surgery

## 2014-11-01 DIAGNOSIS — K8050 Calculus of bile duct without cholangitis or cholecystitis without obstruction: Secondary | ICD-10-CM | POA: Diagnosis not present

## 2014-11-01 DIAGNOSIS — M1990 Unspecified osteoarthritis, unspecified site: Secondary | ICD-10-CM | POA: Diagnosis not present

## 2014-11-01 DIAGNOSIS — R1011 Right upper quadrant pain: Secondary | ICD-10-CM | POA: Diagnosis present

## 2014-11-01 DIAGNOSIS — Z87891 Personal history of nicotine dependence: Secondary | ICD-10-CM | POA: Diagnosis not present

## 2014-11-01 DIAGNOSIS — K830 Cholangitis: Secondary | ICD-10-CM | POA: Diagnosis not present

## 2014-11-01 DIAGNOSIS — Z8249 Family history of ischemic heart disease and other diseases of the circulatory system: Secondary | ICD-10-CM | POA: Diagnosis not present

## 2014-11-01 DIAGNOSIS — R109 Unspecified abdominal pain: Secondary | ICD-10-CM | POA: Diagnosis not present

## 2014-11-01 DIAGNOSIS — R935 Abnormal findings on diagnostic imaging of other abdominal regions, including retroperitoneum: Secondary | ICD-10-CM | POA: Diagnosis not present

## 2014-11-01 DIAGNOSIS — E876 Hypokalemia: Secondary | ICD-10-CM | POA: Diagnosis not present

## 2014-11-01 DIAGNOSIS — I10 Essential (primary) hypertension: Secondary | ICD-10-CM | POA: Diagnosis not present

## 2014-11-01 DIAGNOSIS — K810 Acute cholecystitis: Secondary | ICD-10-CM | POA: Diagnosis not present

## 2014-11-01 DIAGNOSIS — K5730 Diverticulosis of large intestine without perforation or abscess without bleeding: Secondary | ICD-10-CM | POA: Diagnosis not present

## 2014-11-01 DIAGNOSIS — R7989 Other specified abnormal findings of blood chemistry: Secondary | ICD-10-CM | POA: Diagnosis not present

## 2014-11-01 DIAGNOSIS — K439 Ventral hernia without obstruction or gangrene: Secondary | ICD-10-CM | POA: Diagnosis not present

## 2014-11-01 DIAGNOSIS — K868 Other specified diseases of pancreas: Secondary | ICD-10-CM | POA: Diagnosis not present

## 2014-11-01 DIAGNOSIS — R945 Abnormal results of liver function studies: Secondary | ICD-10-CM

## 2014-11-01 DIAGNOSIS — Z66 Do not resuscitate: Secondary | ICD-10-CM | POA: Diagnosis not present

## 2014-11-01 DIAGNOSIS — K8040 Calculus of bile duct with cholecystitis, unspecified, without obstruction: Secondary | ICD-10-CM | POA: Insufficient documentation

## 2014-11-01 DIAGNOSIS — Z95828 Presence of other vascular implants and grafts: Secondary | ICD-10-CM

## 2014-11-01 DIAGNOSIS — K7689 Other specified diseases of liver: Secondary | ICD-10-CM | POA: Diagnosis not present

## 2014-11-01 DIAGNOSIS — Z823 Family history of stroke: Secondary | ICD-10-CM

## 2014-11-01 DIAGNOSIS — K8033 Calculus of bile duct with acute cholangitis with obstruction: Principal | ICD-10-CM | POA: Insufficient documentation

## 2014-11-01 DIAGNOSIS — K838 Other specified diseases of biliary tract: Secondary | ICD-10-CM | POA: Diagnosis not present

## 2014-11-01 DIAGNOSIS — D649 Anemia, unspecified: Secondary | ICD-10-CM | POA: Diagnosis not present

## 2014-11-01 LAB — I-STAT CG4 LACTIC ACID, ED: Lactic Acid, Venous: 3.16 mmol/L (ref 0.5–2.0)

## 2014-11-01 LAB — COMPREHENSIVE METABOLIC PANEL
ALK PHOS: 113 U/L (ref 38–126)
ALT: 152 U/L — AB (ref 14–54)
AST: 287 U/L — AB (ref 15–41)
Albumin: 3.7 g/dL (ref 3.5–5.0)
Anion gap: 12 (ref 5–15)
BILIRUBIN TOTAL: 2.2 mg/dL — AB (ref 0.3–1.2)
BUN: 22 mg/dL — AB (ref 6–20)
CALCIUM: 9.6 mg/dL (ref 8.9–10.3)
CO2: 33 mmol/L — ABNORMAL HIGH (ref 22–32)
CREATININE: 1.07 mg/dL — AB (ref 0.44–1.00)
Chloride: 96 mmol/L — ABNORMAL LOW (ref 101–111)
GFR calc Af Amer: 51 mL/min — ABNORMAL LOW (ref >60)
GFR, EST NON AFRICAN AMERICAN: 44 mL/min — AB (ref >60)
Glucose, Bld: 85 mg/dL (ref 65–99)
Potassium: 3.9 mmol/L (ref 3.5–5.1)
Sodium: 141 mmol/L (ref 135–145)
TOTAL PROTEIN: 7.4 g/dL (ref 6.5–8.1)

## 2014-11-01 LAB — CBC WITH DIFFERENTIAL/PLATELET
BASOS ABS: 0 10*3/uL (ref 0.0–0.1)
Basophils Relative: 0 % (ref 0–1)
Eosinophils Absolute: 0.1 10*3/uL (ref 0.0–0.7)
Eosinophils Relative: 1 % (ref 0–5)
HEMATOCRIT: 46.3 % — AB (ref 36.0–46.0)
HEMOGLOBIN: 15.9 g/dL — AB (ref 12.0–15.0)
LYMPHS PCT: 6 % — AB (ref 12–46)
Lymphs Abs: 0.7 10*3/uL (ref 0.7–4.0)
MCH: 29.9 pg (ref 26.0–34.0)
MCHC: 34.3 g/dL (ref 30.0–36.0)
MCV: 87 fL (ref 78.0–100.0)
MONO ABS: 0.1 10*3/uL (ref 0.1–1.0)
Monocytes Relative: 1 % — ABNORMAL LOW (ref 3–12)
NEUTROS ABS: 11.7 10*3/uL — AB (ref 1.7–7.7)
Neutrophils Relative %: 92 % — ABNORMAL HIGH (ref 43–77)
Platelets: 233 10*3/uL (ref 150–400)
RBC: 5.32 MIL/uL — AB (ref 3.87–5.11)
RDW: 14.9 % (ref 11.5–15.5)
WBC: 12.6 10*3/uL — ABNORMAL HIGH (ref 4.0–10.5)

## 2014-11-01 LAB — URINE MICROSCOPIC-ADD ON

## 2014-11-01 LAB — LIPASE, BLOOD: Lipase: 20 U/L — ABNORMAL LOW (ref 22–51)

## 2014-11-01 LAB — TROPONIN I

## 2014-11-01 LAB — URINALYSIS, ROUTINE W REFLEX MICROSCOPIC
Bilirubin Urine: NEGATIVE
GLUCOSE, UA: NEGATIVE mg/dL
Ketones, ur: NEGATIVE mg/dL
LEUKOCYTES UA: NEGATIVE
NITRITE: NEGATIVE
PH: 5.5 (ref 5.0–8.0)
Protein, ur: NEGATIVE mg/dL
Specific Gravity, Urine: 1.01 (ref 1.005–1.030)
Urobilinogen, UA: 2 mg/dL — ABNORMAL HIGH (ref 0.0–1.0)

## 2014-11-01 MED ORDER — FENTANYL CITRATE (PF) 100 MCG/2ML IJ SOLN
12.5000 ug | Freq: Once | INTRAMUSCULAR | Status: AC
  Administered 2014-11-01: 12.5 ug via INTRAVENOUS
  Filled 2014-11-01: qty 2

## 2014-11-01 MED ORDER — CETYLPYRIDINIUM CHLORIDE 0.05 % MT LIQD
7.0000 mL | Freq: Two times a day (BID) | OROMUCOSAL | Status: DC
  Administered 2014-11-02 – 2014-11-16 (×30): 7 mL via OROMUCOSAL

## 2014-11-01 MED ORDER — ONDANSETRON HCL 4 MG/2ML IJ SOLN
4.0000 mg | Freq: Four times a day (QID) | INTRAMUSCULAR | Status: DC | PRN
  Administered 2014-11-05 – 2014-11-15 (×10): 4 mg via INTRAVENOUS
  Filled 2014-11-01 (×10): qty 2

## 2014-11-01 MED ORDER — INFLUENZA VAC SPLIT QUAD 0.5 ML IM SUSY
0.5000 mL | PREFILLED_SYRINGE | INTRAMUSCULAR | Status: AC
  Administered 2014-11-02: 0.5 mL via INTRAMUSCULAR
  Filled 2014-11-01: qty 0.5

## 2014-11-01 MED ORDER — IOHEXOL 300 MG/ML  SOLN
25.0000 mL | Freq: Once | INTRAMUSCULAR | Status: AC | PRN
  Administered 2014-11-01: 25 mL via ORAL

## 2014-11-01 MED ORDER — SODIUM CHLORIDE 0.9 % IV SOLN
INTRAVENOUS | Status: DC
  Administered 2014-11-02 – 2014-11-04 (×5): via INTRAVENOUS
  Administered 2014-11-05: 1000 mL via INTRAVENOUS
  Administered 2014-11-06: 03:00:00 via INTRAVENOUS

## 2014-11-01 MED ORDER — MORPHINE SULFATE (PF) 2 MG/ML IV SOLN
1.0000 mg | INTRAVENOUS | Status: DC | PRN
  Administered 2014-11-02 – 2014-11-09 (×32): 1 mg via INTRAVENOUS
  Filled 2014-11-01 (×34): qty 1

## 2014-11-01 MED ORDER — ONDANSETRON HCL 4 MG PO TABS: 4.0000 mg | ORAL_TABLET | Freq: Four times a day (QID) | ORAL | Status: DC | PRN

## 2014-11-01 MED ORDER — GABAPENTIN 300 MG PO CAPS
300.0000 mg | ORAL_CAPSULE | Freq: Three times a day (TID) | ORAL | Status: DC
  Administered 2014-11-02 – 2014-11-04 (×7): 300 mg via ORAL
  Filled 2014-11-01 (×8): qty 1

## 2014-11-01 MED ORDER — ENOXAPARIN SODIUM 30 MG/0.3ML ~~LOC~~ SOLN
30.0000 mg | SUBCUTANEOUS | Status: DC
  Administered 2014-11-02: 30 mg via SUBCUTANEOUS
  Filled 2014-11-01: qty 0.3

## 2014-11-01 MED ORDER — IOHEXOL 300 MG/ML  SOLN
100.0000 mL | Freq: Once | INTRAMUSCULAR | Status: AC | PRN
  Administered 2014-11-01: 100 mL via INTRAVENOUS

## 2014-11-01 MED ORDER — FENTANYL 50 MCG/HR TD PT72: 50.0000 ug | MEDICATED_PATCH | TRANSDERMAL | Status: DC

## 2014-11-01 MED ORDER — ONDANSETRON HCL 4 MG/2ML IJ SOLN
4.0000 mg | Freq: Once | INTRAMUSCULAR | Status: AC
  Administered 2014-11-01: 4 mg via INTRAVENOUS
  Filled 2014-11-01: qty 2

## 2014-11-01 NOTE — ED Provider Notes (Signed)
CSN: [id number]     Arrival date & time 11/01/14  1740 History   First MD Initiated Contact with Patient 11/01/14 1742     Chief Complaint  Patient presents with  . Abdominal Pain     (Consider location/radiation/quality/duration/timing/severity/associated sxs/prior Treatment) HPI Comments: Patient presents to the emergency department for evaluation of abdominal pain. Patient reports severe, sharp and stabbing pain in the right upper quadrant. Patient reports that the pain radiates up into the right side of her chest and into her right shoulder. She describes it as like a knife stabbing. She has not had any fever. There is no vomiting, diarrhea or constipation.  Patient is a 81 y.o. female presenting with abdominal pain.  Abdominal Pain Associated symptoms: chest pain     Past Medical History  Diagnosis Date  . Gout   . Osteoarthritis   . Leg cramps   . Choledocholithiasis 3 2014 and 5 2014    S/P ERCP and sphincterotomy x2  . Hypertension    Past Surgical History  Procedure Laterality Date  . Cholecystectomy    . Appendectomy    . Cataract extraction Bilateral   . Wrist arthroplasty Right   . Right femur      metal plate  . Breast biopsy Left   . Ercp  05/23/2012    RMR: juxta-ampullary duodenal diverticulum, markedly dilated biliary tree with choledocholithiasis, s/p sphincterotomy with biliary sphincterotomy balloon dilation, balloon and basket stone extraction, stent placement  . Removal of stones  05/23/2012    Procedure: BALLOON AND BASKET REMOVAL OF STONES;  Surgeon: Corbin Ade, MD;  Location: [facility];  Service: Endoscopy;;  . Sphincterotomy  05/23/2012    Procedure: Dennison Mascot;  Surgeon: Corbin Ade, MD;  Location: [facility];  Service: Endoscopy;;  . Biliary stent placement  05/23/2012    Procedure: BILIARY STENT PLACEMENT;  Surgeon: Corbin Ade, MD;  Location: [facility];  Service: Endoscopy;;  . Balloon dilation  05/23/2012    Procedure: AMPULLARY BALLOON  DILATION;  Surgeon: Corbin Ade, MD;  Location: [facility];  Service: Endoscopy;;  . Ercp N/A 07/24/2012    [id number] duodenal diverticulum/s/p dilation sphincterotomy site s/p basket stone extraction s/p cholangioscopy with holmium lithotripsy of common duct stones  . Spyglass cholangioscopy N/A 07/24/2012    Procedure: VHQIONGE CHOLANGIOSCOPY;  Surgeon: Corbin Ade, MD;  Location: [facility];  Service: Endoscopy;  Laterality: N/A;  . Spyglass lithotripsy N/A 07/24/2012    Procedure: XBMWUXLK LITHOTRIPSY;  Surgeon: Corbin Ade, MD;  Location: [facility];  Service: Endoscopy;  Laterality: N/A;  . Removal of stones N/A 07/24/2012    Procedure: REMOVAL OF STONES;  Surgeon: Corbin Ade, MD;  Location: [facility];  Service: Endoscopy;  Laterality: N/A;  . Balloon dilation N/A 07/24/2012    Procedure: BALLOON DILATION; Balloon basket dredge and balloon dialate sphincterotomy;  Surgeon: Corbin Ade, MD;  Location: [facility];  Service: Endoscopy;  Laterality: N/A;  . Holmium laser application N/A 07/24/2012    Procedure: HOLMIUM LASER APPLICATION;  Surgeon: Corbin Ade, MD;  Location: [facility];  Service: Endoscopy;  Laterality: N/A;  . Ercp N/A 02/17/2013    Dr. Jena Gauss: markedly dilated biliary tree with choledocholithiasis, s/p sphincterotomy and extraction  . Stone extraction with basket N/A 02/17/2013    Procedure: STONE EXTRACTION WITH BASKET and BALLOON;  Surgeon: Corbin Ade, MD;  Location: [facility];  Service: Endoscopy;  Laterality: N/A;  . Balloon dilation N/A 02/17/2013  Procedure: BALLOON DILATION;  Surgeon: Corbin Ade, MD;  Location: [facility];  Service: Endoscopy;  Laterality: N/A;  . Carpal tunnel release Right    Family History  Problem Relation Age of Onset  . Stroke Father   . CAD Mother   . Cancer Son     ?metastatic   Social History  Substance Use Topics  . Smoking status: Former Smoker -- 0.25 packs/day for 15 years    Types: Cigarettes    Quit date: 07/17/1988  .  Smokeless tobacco: Never Used  . Alcohol Use: No   OB History    Gravida Para Term Preterm AB TAB SAB Ectopic Multiple Living   1 1 1        0     Review of Systems  Cardiovascular: Positive for chest pain.  Gastrointestinal: Positive for abdominal pain.  All other systems reviewed and are negative.     Allergies  Review of patient's allergies indicates no known allergies.  Home Medications   Prior to Admission medications   Medication Sig Start Date End Date Taking? Authorizing Provider  allopurinol (ZYLOPRIM) 100 MG tablet Take 100 mg by mouth daily.   Yes Historical Provider, MD  felodipine (PLENDIL) 10 MG 24 hr tablet Take 10 mg by mouth daily.   Yes Historical Provider, MD  fentaNYL (DURAGESIC - DOSED MCG/HR) 50 MCG/HR Place 50 mcg onto the skin every 3 (three) days.   Yes Historical Provider, MD  furosemide (LASIX) 40 MG tablet Take 60 mg by mouth 2 (two) times daily.   Yes Historical Provider, MD  gabapentin (NEURONTIN) 100 MG capsule TAKE 3 CAPSULES BY MOUTH THREE TIMES DAILY. Patient taking differently: TAKE 2 CAPSULES BY MOUTH THREE TIMES DAILY. 10/18/14  Yes Anson Fret, MD  metolazone (ZAROXOLYN) 2.5 MG tablet Take 2.5 mg by mouth daily as needed (for fluid).  08/10/13  Yes Historical Provider, MD  polyethylene glycol (MIRALAX / GLYCOLAX) packet Take 17 g by mouth daily as needed for moderate constipation.   Yes Historical Provider, MD  potassium chloride SA (K-DUR,KLOR-CON) 20 MEQ tablet Take 40 mEq by mouth daily.    Yes Historical Provider, MD  traMADol-acetaminophen (ULTRACET) 37.5-325 MG per tablet Take 1 tablet by mouth 3 (three) times daily as needed for pain.   Yes Historical Provider, MD  ursodiol (ACTIGALL) 300 MG capsule Take 1 capsule (300 mg total) by mouth 2 (two) times daily. 02/19/13  Yes Avon Gully, MD  HYDROcodone-acetaminophen (NORCO/VICODIN) 5-325 MG per tablet Take 1 tablet by mouth every 4 (four) hours as needed for moderate pain or severe  pain. Patient not taking: Reported on 11/01/2014 03/26/14   Ivery Quale, PA-C   BP 110/75 mmHg  Pulse 89  Temp(Src) 98 F (36.7 C) (Oral)  Resp 21  Ht 5' (1.524 m)  Wt 136 lb (61.689 kg)  BMI 26.56 kg/m2  SpO2 95% Physical Exam  Constitutional: She is oriented to person, place, and time. She appears well-developed and well-nourished. No distress.  HENT:  Head: Normocephalic and atraumatic.  Right Ear: Hearing normal.  Left Ear: Hearing normal.  Nose: Nose normal.  Mouth/Throat: Oropharynx is clear and moist and mucous membranes are normal.  Eyes: Conjunctivae and EOM are normal. Pupils are equal, round, and reactive to light.  Neck: Normal range of motion. Neck supple.  Cardiovascular: Regular rhythm, S1 normal and S2 normal.  Exam reveals no gallop and no friction rub.   No murmur heard. Pulmonary/Chest: Effort normal and breath sounds normal. No  respiratory distress. She exhibits no tenderness.  Abdominal: Soft. Normal appearance and bowel sounds are normal. There is no hepatosplenomegaly. There is tenderness in the right upper quadrant and epigastric area. There is no rebound, no guarding, no tenderness at McBurney's point and negative Murphy's sign. No hernia.  Musculoskeletal: Normal range of motion.  Neurological: She is alert and oriented to person, place, and time. She has normal strength. No cranial nerve deficit or sensory deficit. Coordination normal. GCS eye subscore is 4. GCS verbal subscore is 5. GCS motor subscore is 6.  Skin: Skin is warm, dry and intact. No rash noted. No cyanosis.  Psychiatric: She has a normal mood and affect. Her speech is normal and behavior is normal. Thought content normal.  Nursing note and vitals reviewed.   ED Course  Procedures (including critical care time) Labs Review Labs Reviewed  CBC WITH DIFFERENTIAL/PLATELET - Abnormal; Notable for the following:    WBC 12.6 (*)    RBC 5.32 (*)    Hemoglobin 15.9 (*)    HCT 46.3 (*)     Neutrophils Relative % 92 (*)    Neutro Abs 11.7 (*)    Lymphocytes Relative 6 (*)    Monocytes Relative 1 (*)    All other components within normal limits  COMPREHENSIVE METABOLIC PANEL - Abnormal; Notable for the following:    Chloride 96 (*)    CO2 33 (*)    BUN 22 (*)    Creatinine, Ser 1.07 (*)    AST 287 (*)    ALT 152 (*)    Total Bilirubin 2.2 (*)    GFR calc non Af Amer 44 (*)    GFR calc Af Amer 51 (*)    All other components within normal limits  LIPASE, BLOOD - Abnormal; Notable for the following:    Lipase 20 (*)    All other components within normal limits  URINALYSIS, ROUTINE W REFLEX MICROSCOPIC (NOT AT [facility]) - Abnormal; Notable for the following:    Hgb urine dipstick LARGE (*)    Urobilinogen, UA 2.0 (*)    All other components within normal limits  URINE MICROSCOPIC-ADD ON - Abnormal; Notable for the following:    Squamous Epithelial / LPF MANY (*)    All other components within normal limits  I-STAT CG4 LACTIC ACID, ED - Abnormal; Notable for the following:    Lactic Acid, Venous 3.16 (*)    All other components within normal limits  TROPONIN I    Imaging Review Ct Abdomen Pelvis W Contrast  11/01/2014   CLINICAL DATA:  Mid abdominal pain.  EXAM: CT ABDOMEN AND PELVIS WITH CONTRAST  TECHNIQUE: Multidetector CT imaging of the abdomen and pelvis was performed using the standard protocol following bolus administration of intravenous contrast.  CONTRAST:  25mL OMNIPAQUE IOHEXOL 300 MG/ML SOLN, OMNIPAQUE IOHEXOL 300 MG/ML SOLN  COMPARISON:  CT 09/24/2013, MR 02/17/2013.  FINDINGS: There is mild dilatation of intrahepatic and extrahepatic bile ducts without significant interval change. There is no obstructing mass or calculus. There is a small amount of air within the biliary system consistent with prior sphincterotomy.  There are no focal liver lesions. There are normal appearances of the spleen. There is mild unchanged pancreatic duct dilatation. The pancreatic  parenchyma is unremarkable. There is mild benign thickening of the adrenals, unchanged. There are normal appearances of the kidneys. Urinary bladder is unremarkable. Uterus and ovaries are unremarkable. Bowel is remarkable only for minimal uncomplicated colonic diverticulosis and a small hiatal  hernia. The abdominal aorta is normal in caliber with moderate atherosclerotic calcification.  No acute inflammatory changes are evident in the abdomen or pelvis.  There is a fat containing midline ventral hernia, supraumbilical. There is severe lumbar degenerative disc change. No significant skeletal lesion is evident. There is no significant abnormality in the lower chest.  There is mild metal artifact in the pelvis due to right hip arthroplasty hardware.  IMPRESSION: 1. No acute findings are evident in the abdomen or pelvis. 2. Unchanged dilatation of the bile ducts and pancreatic duct without evidence of obstructing mass or stone. Pneumobilia is present, consistent with prior sphincterotomy. 3. Small hiatal hernia. 4. Mild diverticulosis. 5. Fat containing supraumbilical midline ventral hernia.   Electronically Signed   By: Ellery Plunk M.D.   On: 11/01/2014 22:04   I have personally reviewed and evaluated these images and lab results as part of my medical decision-making.   EKG Interpretation   Date/Time:  Monday November 01 2014 17:54:57 EDT Ventricular Rate:  73 PR Interval:    QRS Duration: 103 QT Interval:  390 QTC Calculation: 430 R Axis:   -70 Text Interpretation:  Normal sinus rhythm Inferior infarct, old Consider  anterior infarct No significant change since last tracing Confirmed by  POLLINA  MD, CHRISTOPHER [phone]) on 11/01/2014 6:02:49 PM      MDM   Final diagnoses:  Right upper quadrant pain   patient presents to the ER for evaluation of right upper quadrant abdominal pain. Patient has a history of recurrent choledocholithiasis despite previous cholecystectomy. She has required ERCP  3 previous times. Patient does have mildly elevated LFTs. She was provided IV analgesia here in the ER. CT scan performed. There is no obvious bile duct stone or ductal dilation, but with the elevated LFTs, suspect that she does have recurrent stone. She will be admitted to the hospital for pain management and further evaluation in the morning.    Gilda Crease, MD 11/01/14 2245

## 2014-11-01 NOTE — ED Notes (Signed)
Abdominal pain to right mid quad.  Area guarded with touch per EMS.  History of gallstones.  Says pain is sharp and stabbing.

## 2014-11-01 NOTE — H&P (Signed)
PCP:   FANTA,TESFAYE, MD   Chief Complaint:  Abdominal pain  HPI: 81 year old female who   has a past medical history of Gout; Osteoarthritis; Leg cramps; Choledocholithiasis (3 2014 and 5 2014); and Hypertension. Today patient came to the hospital for right upper quadrant pain. Patient has a history of choledocholithiasis requiring multiple ERCP and stone removal. She status post cholecystectomy. She also had one episode of vomiting, no diarrhea. She denies chest pain or shortness of breath. In the ED patient was found to have elevated liver enzymes AST 287, ALT 152. Mild elevation of total bilirubin 2.2   Allergies:  No Known Allergies    Past Medical History  Diagnosis Date  . Gout   . Osteoarthritis   . Leg cramps   . Choledocholithiasis 3 2014 and 5 2014    S/P ERCP and sphincterotomy x2  . Hypertension     Past Surgical History  Procedure Laterality Date  . Cholecystectomy    . Appendectomy    . Cataract extraction Bilateral   . Wrist arthroplasty Right   . Right femur      metal plate  . Breast biopsy Left   . Ercp  05/23/2012    RMR: juxta-ampullary duodenal diverticulum, markedly dilated biliary tree with choledocholithiasis, s/p sphincterotomy with biliary sphincterotomy balloon dilation, balloon and basket stone extraction, stent placement  . Removal of stones  05/23/2012    Procedure: BALLOON AND BASKET REMOVAL OF STONES;  Surgeon: Corbin Ade, MD;  Location: [facility];  Service: Endoscopy;;  . Sphincterotomy  05/23/2012    Procedure: Dennison Mascot;  Surgeon: Corbin Ade, MD;  Location: [facility];  Service: Endoscopy;;  . Biliary stent placement  05/23/2012    Procedure: BILIARY STENT PLACEMENT;  Surgeon: Corbin Ade, MD;  Location: [facility];  Service: Endoscopy;;  . Balloon dilation  05/23/2012    Procedure: AMPULLARY BALLOON DILATION;  Surgeon: Corbin Ade, MD;  Location: [facility];  Service: Endoscopy;;  . Ercp N/A 07/24/2012    WUJ:[id number]  duodenal diverticulum/s/p dilation sphincterotomy site s/p basket stone extraction s/p cholangioscopy with holmium lithotripsy of common duct stones  . Spyglass cholangioscopy N/A 07/24/2012    Procedure: HYQMVHQI CHOLANGIOSCOPY;  Surgeon: Corbin Ade, MD;  Location: [facility];  Service: Endoscopy;  Laterality: N/A;  . Spyglass lithotripsy N/A 07/24/2012    Procedure: ONGEXBMW LITHOTRIPSY;  Surgeon: Corbin Ade, MD;  Location: [facility];  Service: Endoscopy;  Laterality: N/A;  . Removal of stones N/A 07/24/2012    Procedure: REMOVAL OF STONES;  Surgeon: Corbin Ade, MD;  Location: [facility];  Service: Endoscopy;  Laterality: N/A;  . Balloon dilation N/A 07/24/2012    Procedure: BALLOON DILATION; Balloon basket dredge and balloon dialate sphincterotomy;  Surgeon: Corbin Ade, MD;  Location: [facility];  Service: Endoscopy;  Laterality: N/A;  . Holmium laser application N/A 07/24/2012    Procedure: HOLMIUM LASER APPLICATION;  Surgeon: Corbin Ade, MD;  Location: [facility];  Service: Endoscopy;  Laterality: N/A;  . Ercp N/A 02/17/2013    Dr. Jena Gauss: markedly dilated biliary tree with choledocholithiasis, s/p sphincterotomy and extraction  . Stone extraction with basket N/A 02/17/2013    Procedure: STONE EXTRACTION WITH BASKET and BALLOON;  Surgeon: Corbin Ade, MD;  Location: [facility];  Service: Endoscopy;  Laterality: N/A;  . Balloon dilation N/A 02/17/2013    Procedure: BALLOON DILATION;  Surgeon: Corbin Ade, MD;  Location: [facility];  Service: Endoscopy;  Laterality: N/A;  .  Carpal tunnel release Right     Prior to Admission medications   Medication Sig Start Date End Date Taking? Authorizing Provider  allopurinol (ZYLOPRIM) 100 MG tablet Take 100 mg by mouth daily.   Yes Historical Provider, MD  felodipine (PLENDIL) 10 MG 24 hr tablet Take 10 mg by mouth daily.   Yes Historical Provider, MD  fentaNYL (DURAGESIC - DOSED MCG/HR) 50 MCG/HR Place 50 mcg onto the skin every 3 (three) days.   Yes  Historical Provider, MD  furosemide (LASIX) 40 MG tablet Take 60 mg by mouth 2 (two) times daily.   Yes Historical Provider, MD  gabapentin (NEURONTIN) 100 MG capsule TAKE 3 CAPSULES BY MOUTH THREE TIMES DAILY. Patient taking differently: TAKE 2 CAPSULES BY MOUTH THREE TIMES DAILY. 10/18/14  Yes Anson Fret, MD  metolazone (ZAROXOLYN) 2.5 MG tablet Take 2.5 mg by mouth daily as needed (for fluid).  08/10/13  Yes Historical Provider, MD  polyethylene glycol (MIRALAX / GLYCOLAX) packet Take 17 g by mouth daily as needed for moderate constipation.   Yes Historical Provider, MD  potassium chloride SA (K-DUR,KLOR-CON) 20 MEQ tablet Take 40 mEq by mouth daily.    Yes Historical Provider, MD  traMADol-acetaminophen (ULTRACET) 37.5-325 MG per tablet Take 1 tablet by mouth 3 (three) times daily as needed for pain.   Yes Historical Provider, MD  ursodiol (ACTIGALL) 300 MG capsule Take 1 capsule (300 mg total) by mouth 2 (two) times daily. 02/19/13  Yes Avon Gully, MD  HYDROcodone-acetaminophen (NORCO/VICODIN) 5-325 MG per tablet Take 1 tablet by mouth every 4 (four) hours as needed for moderate pain or severe pain. Patient not taking: Reported on 11/01/2014 03/26/14   Ivery Quale, PA-C    Social History:  reports that she quit smoking about 26 years ago. Her smoking use included Cigarettes. She has a 3.75 pack-year smoking history. She has never used smokeless tobacco. She reports that she does not drink alcohol or use illicit drugs.  Family History  Problem Relation Age of Onset  . Stroke Father   . CAD Mother   . Cancer Son     ?metastatic    Filed Weights   11/01/14 1758  Weight: 61.689 kg (136 lb)    All the positives are listed in BOLD  Review of Systems:  HEENT: Headache, blurred vision, runny nose, sore throat Neck: Hypothyroidism, hyperthyroidism,,lymphadenopathy Chest : Shortness of breath, history of COPD, Asthma Heart : Chest pain, history of coronary arterey disease GI:   Nausea, vomiting, diarrhea, constipation, GERD GU: Dysuria, urgency, frequency of urination, hematuria Neuro: Stroke, seizures, syncope Psych: Depression, anxiety, hallucinations   Physical Exam: Blood pressure 110/75, pulse 89, temperature 98 F (36.7 C), temperature source Oral, resp. rate 21, height 5' (1.524 m), weight 61.689 kg (136 lb), SpO2 95 %. Constitutional:   Patient is a well-developed and well-nourished female in no acute distress and cooperative with exam. Head: Normocephalic and atraumatic Mouth: Mucus membranes moist Eyes: PERRL, EOMI, conjunctivae normal Neck: Supple, No Thyromegaly Cardiovascular: RRR, S1 normal, S2 normal Pulmonary/Chest: CTAB, no wheezes, rales, or rhonchi Abdominal: Soft. Right upper quadrant tenderness, non-distended, bowel sounds are normal, no masses, organomegaly, or guarding present.  Neurological: A&O x3, Strength is normal and symmetric bilaterally, cranial nerve II-XII are grossly intact, no focal motor deficit, sensory intact to light touch bilaterally.  Extremities : No Cyanosis, Clubbing or Edema  Labs on Admission:  Basic Metabolic Panel:  Recent Labs Lab 11/01/14 1810  NA 141  K 3.9  CL 96*  CO2 33*  GLUCOSE 85  BUN 22*  CREATININE 1.07*  CALCIUM 9.6   Liver Function Tests:  Recent Labs Lab 11/01/14 1810  AST 287*  ALT 152*  ALKPHOS 113  BILITOT 2.2*  PROT 7.4  ALBUMIN 3.7    Recent Labs Lab 11/01/14 1810  LIPASE 20*   No results for input(s): AMMONIA in the last 168 hours. CBC:  Recent Labs Lab 11/01/14 1810  WBC 12.6*  NEUTROABS 11.7*  HGB 15.9*  HCT 46.3*  MCV 87.0  PLT 233   Cardiac Enzymes:  Recent Labs Lab 11/01/14 1810  TROPONINI <0.03     Radiological Exams on Admission: Ct Abdomen Pelvis W Contrast  11/01/2014   CLINICAL DATA:  Mid abdominal pain.  EXAM: CT ABDOMEN AND PELVIS WITH CONTRAST  TECHNIQUE: Multidetector CT imaging of the abdomen and pelvis was performed using the  standard protocol following bolus administration of intravenous contrast.  CONTRAST:  25mL OMNIPAQUE IOHEXOL 300 MG/ML SOLN, OMNIPAQUE IOHEXOL 300 MG/ML SOLN  COMPARISON:  CT 09/24/2013, MR 02/17/2013.  FINDINGS: There is mild dilatation of intrahepatic and extrahepatic bile ducts without significant interval change. There is no obstructing mass or calculus. There is a small amount of air within the biliary system consistent with prior sphincterotomy.  There are no focal liver lesions. There are normal appearances of the spleen. There is mild unchanged pancreatic duct dilatation. The pancreatic parenchyma is unremarkable. There is mild benign thickening of the adrenals, unchanged. There are normal appearances of the kidneys. Urinary bladder is unremarkable. Uterus and ovaries are unremarkable. Bowel is remarkable only for minimal uncomplicated colonic diverticulosis and a small hiatal hernia. The abdominal aorta is normal in caliber with moderate atherosclerotic calcification.  No acute inflammatory changes are evident in the abdomen or pelvis.  There is a fat containing midline ventral hernia, supraumbilical. There is severe lumbar degenerative disc change. No significant skeletal lesion is evident. There is no significant abnormality in the lower chest.  There is mild metal artifact in the pelvis due to right hip arthroplasty hardware.  IMPRESSION: 1. No acute findings are evident in the abdomen or pelvis. 2. Unchanged dilatation of the bile ducts and pancreatic duct without evidence of obstructing mass or stone. Pneumobilia is present, consistent with prior sphincterotomy. 3. Small hiatal hernia. 4. Mild diverticulosis. 5. Fat containing supraumbilical midline ventral hernia.   Electronically Signed   By: Ellery Plunk M.D.   On: 11/01/2014 22:04    EKG: Independently reviewed. Normal sinus rhythm   Assessment/Plan Active Problems:   RUQ pain   Abdominal pain   Elevated LFTs    Hyperbilirubinemia  Right upper quadrant pain Patient has a previous history of choledocholithiasis requiring ERCP. Will admit the patient keep nothing by mouth IV fluids and consult gastroenterology in the morning Follow liver enzymes in a.m. Continue fentanyl patch Start morphine when necessary for breakthrough pain  Code status: DO NOT RESUSCITATE  Family discussion: Admission, patients condition and plan of care including tests being ordered have been discussed with the patient and *her niece at bedside* who indicate understanding and agree with the plan and Code Status.   Time Spent on Admission: 60 min  Nealie Mchatton S [facility] Pager: [phone] 11/01/2014, 10:45 PM  If 7PM-7AM, please contact night-coverage  [email or web address]  Password [email or web address]

## 2014-11-02 ENCOUNTER — Encounter (HOSPITAL_COMMUNITY): Payer: Self-pay | Admitting: Gastroenterology

## 2014-11-02 ENCOUNTER — Observation Stay (HOSPITAL_COMMUNITY): Payer: Medicare Other

## 2014-11-02 DIAGNOSIS — R7989 Other specified abnormal findings of blood chemistry: Secondary | ICD-10-CM | POA: Diagnosis not present

## 2014-11-02 DIAGNOSIS — Z8249 Family history of ischemic heart disease and other diseases of the circulatory system: Secondary | ICD-10-CM | POA: Diagnosis not present

## 2014-11-02 DIAGNOSIS — K8033 Calculus of bile duct with acute cholangitis with obstruction: Secondary | ICD-10-CM | POA: Diagnosis not present

## 2014-11-02 DIAGNOSIS — Z823 Family history of stroke: Secondary | ICD-10-CM | POA: Diagnosis not present

## 2014-11-02 DIAGNOSIS — E876 Hypokalemia: Secondary | ICD-10-CM | POA: Diagnosis not present

## 2014-11-02 DIAGNOSIS — R1011 Right upper quadrant pain: Secondary | ICD-10-CM | POA: Diagnosis not present

## 2014-11-02 DIAGNOSIS — K8050 Calculus of bile duct without cholangitis or cholecystitis without obstruction: Secondary | ICD-10-CM | POA: Diagnosis not present

## 2014-11-02 DIAGNOSIS — R935 Abnormal findings on diagnostic imaging of other abdominal regions, including retroperitoneum: Secondary | ICD-10-CM | POA: Diagnosis not present

## 2014-11-02 DIAGNOSIS — I10 Essential (primary) hypertension: Secondary | ICD-10-CM | POA: Diagnosis not present

## 2014-11-02 DIAGNOSIS — K868 Other specified diseases of pancreas: Secondary | ICD-10-CM | POA: Diagnosis not present

## 2014-11-02 DIAGNOSIS — K7689 Other specified diseases of liver: Secondary | ICD-10-CM | POA: Diagnosis not present

## 2014-11-02 DIAGNOSIS — M1990 Unspecified osteoarthritis, unspecified site: Secondary | ICD-10-CM | POA: Diagnosis not present

## 2014-11-02 DIAGNOSIS — Z66 Do not resuscitate: Secondary | ICD-10-CM | POA: Diagnosis not present

## 2014-11-02 DIAGNOSIS — Z87891 Personal history of nicotine dependence: Secondary | ICD-10-CM | POA: Diagnosis not present

## 2014-11-02 LAB — CBC
HCT: 41.1 % (ref 36.0–46.0)
HEMOGLOBIN: 14 g/dL (ref 12.0–15.0)
MCH: 29.9 pg (ref 26.0–34.0)
MCHC: 34.1 g/dL (ref 30.0–36.0)
MCV: 87.8 fL (ref 78.0–100.0)
Platelets: 201 10*3/uL (ref 150–400)
RBC: 4.68 MIL/uL (ref 3.87–5.11)
RDW: 15.2 % (ref 11.5–15.5)
WBC: 22.7 10*3/uL — ABNORMAL HIGH (ref 4.0–10.5)

## 2014-11-02 LAB — COMPREHENSIVE METABOLIC PANEL
ALBUMIN: 2.8 g/dL — AB (ref 3.5–5.0)
ALK PHOS: 94 U/L (ref 38–126)
ALT: 137 U/L — AB (ref 14–54)
AST: 180 U/L — ABNORMAL HIGH (ref 15–41)
Anion gap: 9 (ref 5–15)
BUN: 19 mg/dL (ref 6–20)
CALCIUM: 8.7 mg/dL — AB (ref 8.9–10.3)
CO2: 32 mmol/L (ref 22–32)
CREATININE: 0.98 mg/dL (ref 0.44–1.00)
Chloride: 97 mmol/L — ABNORMAL LOW (ref 101–111)
GFR calc non Af Amer: 49 mL/min — ABNORMAL LOW (ref >60)
GFR, EST AFRICAN AMERICAN: 57 mL/min — AB (ref >60)
GLUCOSE: 115 mg/dL — AB (ref 65–99)
Potassium: 3.4 mmol/L — ABNORMAL LOW (ref 3.5–5.1)
SODIUM: 138 mmol/L (ref 135–145)
Total Bilirubin: 2.6 mg/dL — ABNORMAL HIGH (ref 0.3–1.2)
Total Protein: 6.1 g/dL — ABNORMAL LOW (ref 6.5–8.1)

## 2014-11-02 LAB — LIPASE, BLOOD: Lipase: <10 U/L — ABNORMAL LOW (ref 22–51)

## 2014-11-02 MED ORDER — SODIUM CHLORIDE 0.9 % IV SOLN
3.0000 g | Freq: Four times a day (QID) | INTRAVENOUS | Status: DC
  Administered 2014-11-02 – 2014-11-15 (×49): 3 g via INTRAVENOUS
  Filled 2014-11-02 (×56): qty 3

## 2014-11-02 MED ORDER — GADOBENATE DIMEGLUMINE 529 MG/ML IV SOLN
12.0000 mL | Freq: Once | INTRAVENOUS | Status: AC | PRN
  Administered 2014-11-02: 12 mL via INTRAVENOUS

## 2014-11-02 NOTE — Care Management Note (Signed)
Case Management Note  Patient Details  Name: ISADORA DELOREY MRN: [id number] Date of Birth: [birth year]  Expected Discharge Date:   11/02/2014               Expected Discharge Plan:  Home/Self Care  In-House Referral:  NA  Discharge planning Services  CM Consult  Post Acute Care Choice:  NA Choice offered to:  NA  DME Arranged:    DME Agency:     HH Arranged:    HH Agency:     Status of Service:  In process, will continue to follow  Medicare Important Message Given:    Date Medicare IM Given:    Medicare IM give by:    Date Additional Medicare IM Given:    Additional Medicare Important Message give by:     If discussed at Long Length of Stay Meetings, dates discussed:    Additional Comments: Pt is from home, lives alone and has neighbors and family who check in on her and help. Pt admitted with abdominal pain. Pt independent with ADL's at baseline. Pt uses a walker and has a BSC at home. Pt plans to discharge home with self care. No CM needs anticipated, will cont to follow for DC planning.  Malcolm Metro, RN 11/02/2014, 2:07 PM

## 2014-11-02 NOTE — Consult Note (Signed)
Referring Provider: Dr Sharl Ma  Primary Care Physician:  Avon Gully, MD Primary Gastroenterologist:  Dr. Jena Gauss   Date of Admission: 11/01/14 Date of Consultation: 11/02/14  Reason for Consultation:  Elevated LFTs   HPI:  Lori Bishop is a 81 y.o. year old female well-known to our practice with a history of multiple episodes of choledocholithiasis, undergoing ERCPs in March 2014, May 2014, and Dec 2014. Gallbladder absent. At time of last ERCP in Dec 2014, notation was made to consider a choledochojejunostomy if further symptomatic stone disease.   Patient presented with acute onset of RUQ pain, radiating around to shoulder, starting yesterday around 3 pm. Associated N/V. No fever/chills. No jaundice. Urine clear, yellow. N/V improved but pain persists. Admitting bilirubin 2.2, transaminitis noted. Repeat LFTs today with improvement of transaminases but slight increase in bilirubin. Lipase 20. CT abd/pelvis with unchanged dilation of bile ducts and pancreatic duct without evidence of obstructing mass or stone. Leukocytosis noted on admission at 12, now with bump to 22. Currently not on empiric antibiotics.   Past Medical History  Diagnosis Date  . Gout   . Osteoarthritis   . Leg cramps   . Choledocholithiasis 3 2014 and 5 2014, Dec 2014    S/P ERCP and sphincterotomy x3  . Hypertension     Past Surgical History  Procedure Laterality Date  . Cholecystectomy    . Appendectomy    . Cataract extraction Bilateral   . Wrist arthroplasty Right   . Right femur      metal plate  . Breast biopsy Left   . Ercp  05/23/2012    RMR: juxta-ampullary duodenal diverticulum, markedly dilated biliary tree with choledocholithiasis, s/p sphincterotomy with biliary sphincterotomy balloon dilation, balloon and basket stone extraction, stent placement  . Removal of stones  05/23/2012    Procedure: BALLOON AND BASKET REMOVAL OF STONES;  Surgeon: Corbin Ade, MD;  Location: [facility];  Service: Endoscopy;;   . Sphincterotomy  05/23/2012    Procedure: Dennison Mascot;  Surgeon: Corbin Ade, MD;  Location: [facility];  Service: Endoscopy;;  . Biliary stent placement  05/23/2012    Procedure: BILIARY STENT PLACEMENT;  Surgeon: Corbin Ade, MD;  Location: [facility];  Service: Endoscopy;;  . Balloon dilation  05/23/2012    Procedure: AMPULLARY BALLOON DILATION;  Surgeon: Corbin Ade, MD;  Location: [facility];  Service: Endoscopy;;  . Ercp N/A 07/24/2012    [id number] duodenal diverticulum/s/p dilation sphincterotomy site s/p basket stone extraction s/p cholangioscopy with holmium lithotripsy of common duct stones  . Spyglass cholangioscopy N/A 07/24/2012    Procedure: YNWGNFAO CHOLANGIOSCOPY;  Surgeon: Corbin Ade, MD;  Location: [facility];  Service: Endoscopy;  Laterality: N/A;  . Spyglass lithotripsy N/A 07/24/2012    Procedure: ZHYQMVHQ LITHOTRIPSY;  Surgeon: Corbin Ade, MD;  Location: [facility];  Service: Endoscopy;  Laterality: N/A;  . Removal of stones N/A 07/24/2012    Procedure: REMOVAL OF STONES;  Surgeon: Corbin Ade, MD;  Location: [facility];  Service: Endoscopy;  Laterality: N/A;  . Balloon dilation N/A 07/24/2012    Procedure: BALLOON DILATION; Balloon basket dredge and balloon dialate sphincterotomy;  Surgeon: Corbin Ade, MD;  Location: [facility];  Service: Endoscopy;  Laterality: N/A;  . Holmium laser application N/A 07/24/2012    Procedure: HOLMIUM LASER APPLICATION;  Surgeon: Corbin Ade, MD;  Location: [facility];  Service: Endoscopy;  Laterality: N/A;  . Ercp N/A 02/17/2013    Dr. Jena Gauss: markedly dilated biliary tree  with choledocholithiasis, s/p sphincterotomy and extraction  . Stone extraction with basket N/A 02/17/2013    Procedure: STONE EXTRACTION WITH BASKET and BALLOON;  Surgeon: Corbin Ade, MD;  Location: [facility];  Service: Endoscopy;  Laterality: N/A;  . Balloon dilation N/A 02/17/2013    Procedure: BALLOON DILATION;  Surgeon: Corbin Ade, MD;  Location: [facility];   Service: Endoscopy;  Laterality: N/A;  . Carpal tunnel release Right     Prior to Admission medications   Medication Sig Start Date End Date Taking? Authorizing Provider  allopurinol (ZYLOPRIM) 100 MG tablet Take 100 mg by mouth daily.   Yes Historical Provider, MD  felodipine (PLENDIL) 10 MG 24 hr tablet Take 10 mg by mouth daily.   Yes Historical Provider, MD  fentaNYL (DURAGESIC - DOSED MCG/HR) 50 MCG/HR Place 50 mcg onto the skin every 3 (three) days.   Yes Historical Provider, MD  furosemide (LASIX) 40 MG tablet Take 60 mg by mouth 2 (two) times daily.   Yes Historical Provider, MD  gabapentin (NEURONTIN) 100 MG capsule TAKE 3 CAPSULES BY MOUTH THREE TIMES DAILY. Patient taking differently: TAKE 2 CAPSULES BY MOUTH THREE TIMES DAILY. 10/18/14  Yes Anson Fret, MD  metolazone (ZAROXOLYN) 2.5 MG tablet Take 2.5 mg by mouth daily as needed (for fluid).  08/10/13  Yes Historical Provider, MD  polyethylene glycol (MIRALAX / GLYCOLAX) packet Take 17 g by mouth daily as needed for moderate constipation.   Yes Historical Provider, MD  potassium chloride SA (K-DUR,KLOR-CON) 20 MEQ tablet Take 40 mEq by mouth daily.    Yes Historical Provider, MD  traMADol-acetaminophen (ULTRACET) 37.5-325 MG per tablet Take 1 tablet by mouth 3 (three) times daily as needed for pain.   Yes Historical Provider, MD  ursodiol (ACTIGALL) 300 MG capsule Take 1 capsule (300 mg total) by mouth 2 (two) times daily. 02/19/13  Yes Avon Gully, MD  HYDROcodone-acetaminophen (NORCO/VICODIN) 5-325 MG per tablet Take 1 tablet by mouth every 4 (four) hours as needed for moderate pain or severe pain. Patient not taking: Reported on 11/01/2014 03/26/14   Ivery Quale, PA-C    Current Facility-Administered Medications  Medication Dose Route Frequency Provider Last Rate Last Dose  . 0.9 %  sodium chloride infusion   Intravenous Continuous Meredeth Ide, MD 75 mL/hr at 11/02/14 0042    . antiseptic oral rinse (CPC / CETYLPYRIDINIUM  CHLORIDE 0.05%) solution 7 mL  7 mL Mouth Rinse BID Avon Gully, MD   7 mL at 11/02/14 0902  . enoxaparin (LOVENOX) injection 30 mg  30 mg Subcutaneous Q24H Meredeth Ide, MD   30 mg at 11/02/14 0040  . fentaNYL (DURAGESIC - dosed mcg/hr) 50 mcg  50 mcg Transdermal Q72H Meredeth Ide, MD   50 mcg at 11/02/14 0043  . gabapentin (NEURONTIN) capsule 300 mg  300 mg Oral TID Meredeth Ide, MD   300 mg at 11/02/14 0040  . Influenza vac split quadrivalent PF (FLUARIX) injection 0.5 mL  0.5 mL Intramuscular Tomorrow-1000 Avon Gully, MD      . morphine 2 MG/ML injection 1 mg  1 mg Intravenous Q4H PRN Meredeth Ide, MD   1 mg at 11/02/14 0902  . ondansetron (ZOFRAN) tablet 4 mg  4 mg Oral Q6H PRN Meredeth Ide, MD       Or  . ondansetron (ZOFRAN) injection 4 mg  4 mg Intravenous Q6H PRN Meredeth Ide, MD        Allergies  as of 11/01/2014  . (No Known Allergies)    Family History  Problem Relation Age of Onset  . Stroke Father   . CAD Mother   . Cancer Son     ?metastatic  . Colon cancer Neg Hx     Social History   Social History  . Marital Status: Widowed    Spouse Name: N/A  . Number of Children: 1  . Years of Education: College   Occupational History  . 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 Smoking status: Former Smoker -- 0.25 packs/day for 15 years    Types: Cigarettes    Quit date: 07/17/1988  . Smokeless tobacco: Never Used  . Alcohol Use: No  . Drug Use: No  . Sexual Activity: No   Other Topics Concern  . Not on file   Social History Narrative   Patient is single with one child.   Patient is right handed.   Patient is a Charity fundraiser    Patient drinks 1 cup daily.    Candiss Norse, niece & caregiver, as well as POA             Review of Systems: Gen: Denies fever, chills, loss of appetite, change in weight or weight loss CV: Denies chest pain, heart palpitations, syncope, edema  Resp: Denies shortness of breath with rest, cough, wheezing GI: see HPI GU :  Denies urinary burning, urinary frequency, urinary incontinence.  MS: Denies joint pain,swelling, cramping Derm: Denies rash, itching, dry skin Psych: Denies depression, anxiety,confusion, or memory loss Heme: Denies bruising, bleeding, and enlarged lymph nodes.  Physical Exam: Vital signs in last 24 hours: Temp:  [98 F (36.7 C)-98.9 F (37.2 C)] 98.9 F (37.2 C) (08/30 0530) Pulse Rate:  [76-89] 76 (08/30 0530) Resp:  [18-21] 18 (08/30 0530) BP: (87-133)/(41-101) 100/50 mmHg (08/30 0600) SpO2:  [80 %-100 %] 99 % (08/30 0530) Weight:  [132 lb 6.4 oz (60.056 kg)-136 lb (61.689 kg)] 132 lb 6.4 oz (60.056 kg) (08/29 2340) Last BM Date: 11/01/14 General:   Alert,  Well-developed, well-nourished, pleasant and cooperative, complaining of RUQ pain.  Head:  Normocephalic and atraumatic. Eyes:  Sclera clear, no icterus.   Conjunctiva pink. Ears:  Normal auditory acuity. Nose:  No deformity, discharge,  or lesions. Mouth:  No deformity or lesions, edentulou Lungs:  Clear throughout to auscultation.   No wheezes, crackles, or rhonchi. No acute distress. Heart:  Regular rate and rhythm Abdomen:  Soft, TTP RUQ and nondistended. No masses, hepatosplenomegaly. No rebound or guarding. Query small umbilical hernia.  Rectal:  Deferred  Msk:  Symmetrical without gross deformities. Normal posture. Extremities:  Without edema. Neurologic:  Alert and  oriented x4;  grossly normal neurologically. Psych:  Alert and cooperative. Normal mood and affect.  Intake/Output from previous day:   Intake/Output this shift: Total I/O In: 180 [P.O.:180] Out: 300 [Urine:300]  Lab Results:  Recent Labs  11/01/14 1810 11/02/14 0538  WBC 12.6* 22.7*  HGB 15.9* 14.0  HCT 46.3* 41.1  PLT 233 201   BMET  Recent Labs  11/01/14 1810 11/02/14 0538  NA 141 138  K 3.9 3.4*  CL 96* 97*  CO2 33* 32  GLUCOSE 85 115*  BUN 22* 19  CREATININE 1.07* 0.98  CALCIUM 9.6 8.7*   LFT  Recent Labs   11/01/14 1810 11/02/14 0538  PROT 7.4 6.1*  ALBUMIN 3.7 2.8*  AST 287* 180*  ALT 152* 137*  ALKPHOS 113 94  BILITOT 2.2* 2.6*   Lab Results  Component Value Date   LIPASE 20* 11/01/2014    Studies/Results: Ct Abdomen Pelvis W Contrast  11/01/2014   CLINICAL DATA:  Mid abdominal pain.  EXAM: CT ABDOMEN AND PELVIS WITH CONTRAST  TECHNIQUE: Multidetector CT imaging of the abdomen and pelvis was performed using the standard protocol following bolus administration of intravenous contrast.  CONTRAST:  25mL OMNIPAQUE IOHEXOL 300 MG/ML SOLN, OMNIPAQUE IOHEXOL 300 MG/ML SOLN  COMPARISON:  CT 09/24/2013, MR 02/17/2013.  FINDINGS: There is mild dilatation of intrahepatic and extrahepatic bile ducts without significant interval change. There is no obstructing mass or calculus. There is a small amount of air within the biliary system consistent with prior sphincterotomy.  There are no focal liver lesions. There are normal appearances of the spleen. There is mild unchanged pancreatic duct dilatation. The pancreatic parenchyma is unremarkable. There is mild benign thickening of the adrenals, unchanged. There are normal appearances of the kidneys. Urinary bladder is unremarkable. Uterus and ovaries are unremarkable. Bowel is remarkable only for minimal uncomplicated colonic diverticulosis and a small hiatal hernia. The abdominal aorta is normal in caliber with moderate atherosclerotic calcification.  No acute inflammatory changes are evident in the abdomen or pelvis.  There is a fat containing midline ventral hernia, supraumbilical. There is severe lumbar degenerative disc change. No significant skeletal lesion is evident. There is no significant abnormality in the lower chest.  There is mild metal artifact in the pelvis due to right hip arthroplasty hardware.  IMPRESSION: 1. No acute findings are evident in the abdomen or pelvis. 2. Unchanged dilatation of the bile ducts and pancreatic duct without evidence  of obstructing mass or stone. Pneumobilia is present, consistent with prior sphincterotomy. 3. Small hiatal hernia. 4. Mild diverticulosis. 5. Fat containing supraumbilical midline ventral hernia.   Electronically Signed   By: Ellery Plunk M.D.   On: 11/01/2014 22:04    Impression: 81 year old female with history of multiple ERCPs in the past secondary to choledocholithiasis, presenting again with acute RUQ pain, N/V, elevated bilirubin and transaminases, and leukocytosis. Although CT does not reveal any CBD stones, high suspicion for occult stone remains. Concern for cholangitis with clinical presentation. Needs empiric antibiotics now, MRI/MRCP, and close monitoring. Last ERCP with stone extraction in 2014. Concern regarding multiple ERCPs has been raised in the past, with discussion of possible choledochojejunostomy at time of last ERCP; however, risks of this may outweigh benefits. Proceed with MRI/MRCP now and add Unasyn for empiric coverage of potential enteric pathogens.   As of note: received Lovenox at 0040 this morning. Lovenox will be discontinued in anticipation of possible need for ERCP.   Plan: Start Unasyn now every 6 hours MRI/MRCP ordered Remain NPO Discontinue Lovenox due to potential for invasive procedure; use SCDs for prophylaxis CBC, HFP in am Recheck lipase now   Nira Retort, ANP-BC [facility]        11/02/2014, 9:52 AM

## 2014-11-02 NOTE — Progress Notes (Signed)
Subjective: Patient was admitted due to RUQ pain, nausea and vomiting. Her LFT including bilirubin is elevated. Patient had similar episode in the past.  Objective: Vital signs in last 24 hours: Temp:  [98 F (36.7 C)-98.9 F (37.2 C)] 98.9 F (37.2 C) (08/30 0530) Pulse Rate:  [76-89] 76 (08/30 0530) Resp:  [18-21] 18 (08/30 0530) BP: (87-133)/(41-101) 100/50 mmHg (08/30 0600) SpO2:  [80 %-100 %] 99 % (08/30 0530) Weight:  [60.056 kg (132 lb 6.4 oz)-61.689 kg (136 lb)] 60.056 kg (132 lb 6.4 oz) (08/29 2340) Weight change:  Last BM Date: 11/01/14  Intake/Output from previous day:    PHYSICAL EXAM General appearance: alert, fatigued and no distress Resp: clear to auscultation bilaterally Cardio: S1, S2 normal GI: soft and lax, bowel sound ++, RUQ tenderness Extremities: extremities normal, atraumatic, no cyanosis or edema  Lab Results:  Results for orders placed or performed during the hospital encounter of 11/01/14 (from the past 48 hour(s))  I-Stat CG4 Lactic Acid, ED     Status: Abnormal   Collection Time: 11/01/14  6:07 PM  Result Value Ref Range   Lactic Acid, Venous 3.16 (HH) 0.5 - 2.0 mmol/L   Comment NOTIFIED PHYSICIAN   CBC with Differential/Platelet     Status: Abnormal   Collection Time: 11/01/14  6:10 PM  Result Value Ref Range   WBC 12.6 (H) 4.0 - 10.5 K/uL   RBC 5.32 (H) 3.87 - 5.11 MIL/uL   Hemoglobin 15.9 (H) 12.0 - 15.0 g/dL   HCT 46.3 (H) 36.0 - 46.0 %   MCV 87.0 78.0 - 100.0 fL   MCH 29.9 26.0 - 34.0 pg   MCHC 34.3 30.0 - 36.0 g/dL   RDW 14.9 11.5 - 15.5 %   Platelets 233 150 - 400 K/uL   Neutrophils Relative % 92 (H) 43 - 77 %   Neutro Abs 11.7 (H) 1.7 - 7.7 K/uL   Lymphocytes Relative 6 (L) 12 - 46 %   Lymphs Abs 0.7 0.7 - 4.0 K/uL   Monocytes Relative 1 (L) 3 - 12 %   Monocytes Absolute 0.1 0.1 - 1.0 K/uL   Eosinophils Relative 1 0 - 5 %   Eosinophils Absolute 0.1 0.0 - 0.7 K/uL   Basophils Relative 0 0 - 1 %   Basophils Absolute 0.0 0.0 -  0.1 K/uL  Comprehensive metabolic panel     Status: Abnormal   Collection Time: 11/01/14  6:10 PM  Result Value Ref Range   Sodium 141 135 - 145 mmol/L   Potassium 3.9 3.5 - 5.1 mmol/L   Chloride 96 (L) 101 - 111 mmol/L   CO2 33 (H) 22 - 32 mmol/L   Glucose, Bld 85 65 - 99 mg/dL   BUN 22 (H) 6 - 20 mg/dL   Creatinine, Ser 1.07 (H) 0.44 - 1.00 mg/dL   Calcium 9.6 8.9 - 10.3 mg/dL   Total Protein 7.4 6.5 - 8.1 g/dL   Albumin 3.7 3.5 - 5.0 g/dL   AST 287 (H) 15 - 41 U/L   ALT 152 (H) 14 - 54 U/L   Alkaline Phosphatase 113 38 - 126 U/L   Total Bilirubin 2.2 (H) 0.3 - 1.2 mg/dL   GFR calc non Af Amer 44 (L) >60 mL/min   GFR calc Af Amer 51 (L) >60 mL/min    Comment: (NOTE) The eGFR has been calculated using the CKD EPI equation. This calculation has not been validated in all clinical situations. eGFR's persistently <60 mL/min signify  possible Chronic Kidney Disease.    Anion gap 12 5 - 15  Troponin I     Status: None   Collection Time: 11/01/14  6:10 PM  Result Value Ref Range   Troponin I <0.03 <0.031 ng/mL    Comment:        NO INDICATION OF MYOCARDIAL INJURY.   Lipase, blood     Status: Abnormal   Collection Time: 11/01/14  6:10 PM  Result Value Ref Range   Lipase 20 (L) 22 - 51 U/L  Urinalysis, Routine w reflex microscopic (not at [facility])     Status: Abnormal   Collection Time: 11/01/14  6:30 PM  Result Value Ref Range   Color, Urine YELLOW YELLOW   APPearance CLEAR CLEAR   Specific Gravity, Urine 1.010 1.005 - 1.030   pH 5.5 5.0 - 8.0   Glucose, UA NEGATIVE NEGATIVE mg/dL   Hgb urine dipstick LARGE (A) NEGATIVE   Bilirubin Urine NEGATIVE NEGATIVE   Ketones, ur NEGATIVE NEGATIVE mg/dL   Protein, ur NEGATIVE NEGATIVE mg/dL   Urobilinogen, UA 2.0 (H) 0.0 - 1.0 mg/dL   Nitrite NEGATIVE NEGATIVE   Leukocytes, UA NEGATIVE NEGATIVE  Urine microscopic-add on     Status: Abnormal   Collection Time: 11/01/14  6:30 PM  Result Value Ref Range   Squamous Epithelial / LPF  MANY (A) RARE   WBC, UA 3-6 <3 WBC/hpf   RBC / HPF 21-50 <3 RBC/hpf   Bacteria, UA RARE RARE  CBC     Status: Abnormal   Collection Time: 11/02/14  5:38 AM  Result Value Ref Range   WBC 22.7 (H) 4.0 - 10.5 K/uL   RBC 4.68 3.87 - 5.11 MIL/uL   Hemoglobin 14.0 12.0 - 15.0 g/dL   HCT 41.1 36.0 - 46.0 %   MCV 87.8 78.0 - 100.0 fL   MCH 29.9 26.0 - 34.0 pg   MCHC 34.1 30.0 - 36.0 g/dL   RDW 15.2 11.5 - 15.5 %   Platelets 201 150 - 400 K/uL  Comprehensive metabolic panel     Status: Abnormal   Collection Time: 11/02/14  5:38 AM  Result Value Ref Range   Sodium 138 135 - 145 mmol/L   Potassium 3.4 (L) 3.5 - 5.1 mmol/L   Chloride 97 (L) 101 - 111 mmol/L   CO2 32 22 - 32 mmol/L   Glucose, Bld 115 (H) 65 - 99 mg/dL   BUN 19 6 - 20 mg/dL   Creatinine, Ser 0.98 0.44 - 1.00 mg/dL   Calcium 8.7 (L) 8.9 - 10.3 mg/dL   Total Protein 6.1 (L) 6.5 - 8.1 g/dL   Albumin 2.8 (L) 3.5 - 5.0 g/dL   AST 180 (H) 15 - 41 U/L   ALT 137 (H) 14 - 54 U/L   Alkaline Phosphatase 94 38 - 126 U/L   Total Bilirubin 2.6 (H) 0.3 - 1.2 mg/dL   GFR calc non Af Amer 49 (L) >60 mL/min   GFR calc Af Amer 57 (L) >60 mL/min    Comment: (NOTE) The eGFR has been calculated using the CKD EPI equation. This calculation has not been validated in all clinical situations. eGFR's persistently <60 mL/min signify possible Chronic Kidney Disease.    Anion gap 9 5 - 15    ABGS No results for input(s): PHART, PO2ART, TCO2, HCO3 in the last 72 hours.  Invalid input(s): PCO2 CULTURES No results found for this or any previous visit (from the past 240 hour(s)). Studies/Results: Ct  Abdomen Pelvis W Contrast  11/01/2014   CLINICAL DATA:  Mid abdominal pain.  EXAM: CT ABDOMEN AND PELVIS WITH CONTRAST  TECHNIQUE: Multidetector CT imaging of the abdomen and pelvis was performed using the standard protocol following bolus administration of intravenous contrast.  CONTRAST:  75m OMNIPAQUE IOHEXOL 300 MG/ML SOLN, 1026mOMNIPAQUE  IOHEXOL 300 MG/ML SOLN  COMPARISON:  CT 09/24/2013, MR 02/17/2013.  FINDINGS: There is mild dilatation of intrahepatic and extrahepatic bile ducts without significant interval change. There is no obstructing mass or calculus. There is a small amount of air within the biliary system consistent with prior sphincterotomy.  There are no focal liver lesions. There are normal appearances of the spleen. There is mild unchanged pancreatic duct dilatation. The pancreatic parenchyma is unremarkable. There is mild benign thickening of the adrenals, unchanged. There are normal appearances of the kidneys. Urinary bladder is unremarkable. Uterus and ovaries are unremarkable. Bowel is remarkable only for minimal uncomplicated colonic diverticulosis and a small hiatal hernia. The abdominal aorta is normal in caliber with moderate atherosclerotic calcification.  No acute inflammatory changes are evident in the abdomen or pelvis.  There is a fat containing midline ventral hernia, supraumbilical. There is severe lumbar degenerative disc change. No significant skeletal lesion is evident. There is no significant abnormality in the lower chest.  There is mild metal artifact in the pelvis due to right hip arthroplasty hardware.  IMPRESSION: 1. No acute findings are evident in the abdomen or pelvis. 2. Unchanged dilatation of the bile ducts and pancreatic duct without evidence of obstructing mass or stone. Pneumobilia is present, consistent with prior sphincterotomy. 3. Small hiatal hernia. 4. Mild diverticulosis. 5. Fat containing supraumbilical midline ventral hernia.   Electronically Signed   By: DaAndreas Newport.D.   On: 11/01/2014 22:04    Medications: I have reviewed the patient's current medications.  Assesment:   Active Problems:   RUQ pain   Abdominal pain   Elevated LFTs   Hyperbilirubinemia    Plan:  Medications reviewed Continue current treatment including pain medication GI consult Will monitor BMP and LFT        Ki Corbo 11/02/2014, 8:12 AM

## 2014-11-03 ENCOUNTER — Encounter (HOSPITAL_COMMUNITY)
Admission: EM | Disposition: A | Discharge status: Home Health | Payer: Self-pay | Source: Home / Self Care | Attending: General Surgery

## 2014-11-03 DIAGNOSIS — Z8249 Family history of ischemic heart disease and other diseases of the circulatory system: Secondary | ICD-10-CM | POA: Diagnosis not present

## 2014-11-03 DIAGNOSIS — K8040 Calculus of bile duct with cholecystitis, unspecified, without obstruction: Secondary | ICD-10-CM | POA: Diagnosis not present

## 2014-11-03 DIAGNOSIS — I10 Essential (primary) hypertension: Secondary | ICD-10-CM | POA: Diagnosis not present

## 2014-11-03 DIAGNOSIS — K8064 Calculus of gallbladder and bile duct with chronic cholecystitis without obstruction: Secondary | ICD-10-CM | POA: Diagnosis not present

## 2014-11-03 DIAGNOSIS — K830 Cholangitis: Secondary | ICD-10-CM | POA: Diagnosis not present

## 2014-11-03 DIAGNOSIS — R1011 Right upper quadrant pain: Secondary | ICD-10-CM | POA: Diagnosis not present

## 2014-11-03 DIAGNOSIS — K8050 Calculus of bile duct without cholangitis or cholecystitis without obstruction: Secondary | ICD-10-CM | POA: Diagnosis not present

## 2014-11-03 DIAGNOSIS — R918 Other nonspecific abnormal finding of lung field: Secondary | ICD-10-CM | POA: Diagnosis not present

## 2014-11-03 DIAGNOSIS — Z87891 Personal history of nicotine dependence: Secondary | ICD-10-CM | POA: Diagnosis not present

## 2014-11-03 DIAGNOSIS — K8033 Calculus of bile duct with acute cholangitis with obstruction: Secondary | ICD-10-CM | POA: Diagnosis not present

## 2014-11-03 DIAGNOSIS — Z823 Family history of stroke: Secondary | ICD-10-CM | POA: Diagnosis not present

## 2014-11-03 DIAGNOSIS — R7989 Other specified abnormal findings of blood chemistry: Secondary | ICD-10-CM

## 2014-11-03 DIAGNOSIS — Z66 Do not resuscitate: Secondary | ICD-10-CM | POA: Diagnosis present

## 2014-11-03 DIAGNOSIS — K8080 Other cholelithiasis without obstruction: Secondary | ICD-10-CM | POA: Diagnosis not present

## 2014-11-03 DIAGNOSIS — D649 Anemia, unspecified: Secondary | ICD-10-CM | POA: Diagnosis not present

## 2014-11-03 DIAGNOSIS — E876 Hypokalemia: Secondary | ICD-10-CM | POA: Diagnosis not present

## 2014-11-03 DIAGNOSIS — Z452 Encounter for adjustment and management of vascular access device: Secondary | ICD-10-CM | POA: Diagnosis not present

## 2014-11-03 DIAGNOSIS — M1990 Unspecified osteoarthritis, unspecified site: Secondary | ICD-10-CM | POA: Diagnosis not present

## 2014-11-03 DIAGNOSIS — K810 Acute cholecystitis: Secondary | ICD-10-CM | POA: Diagnosis not present

## 2014-11-03 LAB — COMPREHENSIVE METABOLIC PANEL
ALBUMIN: 2.9 g/dL — AB (ref 3.5–5.0)
ALT: 91 U/L — ABNORMAL HIGH (ref 14–54)
AST: 92 U/L — AB (ref 15–41)
Alkaline Phosphatase: 95 U/L (ref 38–126)
Anion gap: 7 (ref 5–15)
BUN: 18 mg/dL (ref 6–20)
CHLORIDE: 96 mmol/L — AB (ref 101–111)
CO2: 33 mmol/L — AB (ref 22–32)
Calcium: 8.3 mg/dL — ABNORMAL LOW (ref 8.9–10.3)
Creatinine, Ser: 1.02 mg/dL — ABNORMAL HIGH (ref 0.44–1.00)
GFR calc Af Amer: 54 mL/min — ABNORMAL LOW (ref >60)
GFR, EST NON AFRICAN AMERICAN: 47 mL/min — AB (ref >60)
Glucose, Bld: 97 mg/dL (ref 65–99)
POTASSIUM: 3.1 mmol/L — AB (ref 3.5–5.1)
SODIUM: 136 mmol/L (ref 135–145)
Total Bilirubin: 1.2 mg/dL (ref 0.3–1.2)
Total Protein: 6.2 g/dL — ABNORMAL LOW (ref 6.5–8.1)

## 2014-11-03 LAB — CBC WITH DIFFERENTIAL/PLATELET
Basophils Absolute: 0 10*3/uL (ref 0.0–0.1)
Basophils Relative: 0 % (ref 0–1)
EOS PCT: 1 % (ref 0–5)
Eosinophils Absolute: 0.2 10*3/uL (ref 0.0–0.7)
HCT: 38.7 % (ref 36.0–46.0)
HEMOGLOBIN: 13.1 g/dL (ref 12.0–15.0)
LYMPHS ABS: 1.2 10*3/uL (ref 0.7–4.0)
LYMPHS PCT: 6 % — AB (ref 12–46)
MCH: 29.6 pg (ref 26.0–34.0)
MCHC: 33.9 g/dL (ref 30.0–36.0)
MCV: 87.4 fL (ref 78.0–100.0)
MONOS PCT: 7 % (ref 3–12)
Monocytes Absolute: 1.2 10*3/uL — ABNORMAL HIGH (ref 0.1–1.0)
Neutro Abs: 16.2 10*3/uL — ABNORMAL HIGH (ref 1.7–7.7)
Neutrophils Relative %: 86 % — ABNORMAL HIGH (ref 43–77)
Platelets: 186 10*3/uL (ref 150–400)
RBC: 4.43 MIL/uL (ref 3.87–5.11)
RDW: 15.3 % (ref 11.5–15.5)
WBC: 18.9 10*3/uL — AB (ref 4.0–10.5)

## 2014-11-03 LAB — SURGICAL PCR SCREEN
MRSA, PCR: NEGATIVE
Staphylococcus aureus: NEGATIVE

## 2014-11-03 SURGERY — ERCP, WITH INTERVENTION IF INDICATED
Anesthesia: General

## 2014-11-03 MED ORDER — POTASSIUM CHLORIDE 10 MEQ/100ML IV SOLN
10.0000 meq | INTRAVENOUS | Status: AC
  Administered 2014-11-03 (×3): 10 meq via INTRAVENOUS
  Filled 2014-11-03: qty 100

## 2014-11-03 MED ORDER — FENTANYL 50 MCG/HR TD PT72
50.0000 ug | MEDICATED_PATCH | TRANSDERMAL | Status: DC
  Administered 2014-11-03 – 2014-11-15 (×4): 50 ug via TRANSDERMAL
  Filled 2014-11-03 (×4): qty 1

## 2014-11-03 MED ORDER — PANTOPRAZOLE SODIUM 40 MG PO TBEC
40.0000 mg | DELAYED_RELEASE_TABLET | Freq: Every day | ORAL | Status: DC
  Administered 2014-11-03 – 2014-11-08 (×6): 40 mg via ORAL
  Filled 2014-11-03 (×6): qty 1

## 2014-11-03 NOTE — Progress Notes (Signed)
1610 Dr.Fanta made aware of K+ 3.1 this AM lab.

## 2014-11-03 NOTE — Progress Notes (Signed)
Subjective: Patient is resting. She is complaining of RUQ pain. No nausea or vomiting. No fever or chills. MRCP showed small stone in the CBD  Objective: Vital signs in last 24 hours: Temp:  [98.7 F (37.1 C)-100.5 F (38.1 C)] 98.7 F (37.1 C) (08/31 0430) Pulse Rate:  [76-90] 76 (08/31 0430) Resp:  [18-20] 20 (08/31 0430) BP: (99-122)/(48-58) 122/58 mmHg (08/31 0430) SpO2:  [91 %-99 %] 99 % (08/31 0430) Weight change:  Last BM Date: 11/01/14  Intake/Output from previous day: 08/30 0701 - 08/31 0700 In: 2207.5 [P.O.:660; I.V.:1147.5; IV Piggyback:400] Out: 1350 [Urine:1350]  PHYSICAL EXAM General appearance: alert, fatigued and no distress Resp: clear to auscultation bilaterally Cardio: S1, S2 normal GI: soft and lax, bowel sound ++, RUQ tenderness Extremities: extremities normal, atraumatic, no cyanosis or edema  Lab Results:  Results for orders placed or performed during the hospital encounter of 11/01/14 (from the past 48 hour(s))  I-Stat CG4 Lactic Acid, ED     Status: Abnormal   Collection Time: 11/01/14  6:07 PM  Result Value Ref Range   Lactic Acid, Venous 3.16 (HH) 0.5 - 2.0 mmol/L   Comment NOTIFIED PHYSICIAN   CBC with Differential/Platelet     Status: Abnormal   Collection Time: 11/01/14  6:10 PM  Result Value Ref Range   WBC 12.6 (H) 4.0 - 10.5 K/uL   RBC 5.32 (H) 3.87 - 5.11 MIL/uL   Hemoglobin 15.9 (H) 12.0 - 15.0 g/dL   HCT 46.3 (H) 36.0 - 46.0 %   MCV 87.0 78.0 - 100.0 fL   MCH 29.9 26.0 - 34.0 pg   MCHC 34.3 30.0 - 36.0 g/dL   RDW 14.9 11.5 - 15.5 %   Platelets 233 150 - 400 K/uL   Neutrophils Relative % 92 (H) 43 - 77 %   Neutro Abs 11.7 (H) 1.7 - 7.7 K/uL   Lymphocytes Relative 6 (L) 12 - 46 %   Lymphs Abs 0.7 0.7 - 4.0 K/uL   Monocytes Relative 1 (L) 3 - 12 %   Monocytes Absolute 0.1 0.1 - 1.0 K/uL   Eosinophils Relative 1 0 - 5 %   Eosinophils Absolute 0.1 0.0 - 0.7 K/uL   Basophils Relative 0 0 - 1 %   Basophils Absolute 0.0 0.0 - 0.1  K/uL  Comprehensive metabolic panel     Status: Abnormal   Collection Time: 11/01/14  6:10 PM  Result Value Ref Range   Sodium 141 135 - 145 mmol/L   Potassium 3.9 3.5 - 5.1 mmol/L   Chloride 96 (L) 101 - 111 mmol/L   CO2 33 (H) 22 - 32 mmol/L   Glucose, Bld 85 65 - 99 mg/dL   BUN 22 (H) 6 - 20 mg/dL   Creatinine, Ser 1.07 (H) 0.44 - 1.00 mg/dL   Calcium 9.6 8.9 - 10.3 mg/dL   Total Protein 7.4 6.5 - 8.1 g/dL   Albumin 3.7 3.5 - 5.0 g/dL   AST 287 (H) 15 - 41 U/L   ALT 152 (H) 14 - 54 U/L   Alkaline Phosphatase 113 38 - 126 U/L   Total Bilirubin 2.2 (H) 0.3 - 1.2 mg/dL   GFR calc non Af Amer 44 (L) >60 mL/min   GFR calc Af Amer 51 (L) >60 mL/min    Comment: (NOTE) The eGFR has been calculated using the CKD EPI equation. This calculation has not been validated in all clinical situations. eGFR's persistently <60 mL/min signify possible Chronic Kidney Disease.  Anion gap 12 5 - 15  Troponin I     Status: None   Collection Time: 11/01/14  6:10 PM  Result Value Ref Range   Troponin I <0.03 <0.031 ng/mL    Comment:        NO INDICATION OF MYOCARDIAL INJURY.   Lipase, blood     Status: Abnormal   Collection Time: 11/01/14  6:10 PM  Result Value Ref Range   Lipase 20 (L) 22 - 51 U/L  Urinalysis, Routine w reflex microscopic (not at [facility])     Status: Abnormal   Collection Time: 11/01/14  6:30 PM  Result Value Ref Range   Color, Urine YELLOW YELLOW   APPearance CLEAR CLEAR   Specific Gravity, Urine 1.010 1.005 - 1.030   pH 5.5 5.0 - 8.0   Glucose, UA NEGATIVE NEGATIVE mg/dL   Hgb urine dipstick LARGE (A) NEGATIVE   Bilirubin Urine NEGATIVE NEGATIVE   Ketones, ur NEGATIVE NEGATIVE mg/dL   Protein, ur NEGATIVE NEGATIVE mg/dL   Urobilinogen, UA 2.0 (H) 0.0 - 1.0 mg/dL   Nitrite NEGATIVE NEGATIVE   Leukocytes, UA NEGATIVE NEGATIVE  Urine microscopic-add on     Status: Abnormal   Collection Time: 11/01/14  6:30 PM  Result Value Ref Range   Squamous Epithelial / LPF MANY  (A) RARE   WBC, UA 3-6 <3 WBC/hpf   RBC / HPF 21-50 <3 RBC/hpf   Bacteria, UA RARE RARE  CBC     Status: Abnormal   Collection Time: 11/02/14  5:38 AM  Result Value Ref Range   WBC 22.7 (H) 4.0 - 10.5 K/uL   RBC 4.68 3.87 - 5.11 MIL/uL   Hemoglobin 14.0 12.0 - 15.0 g/dL   HCT 41.1 36.0 - 46.0 %   MCV 87.8 78.0 - 100.0 fL   MCH 29.9 26.0 - 34.0 pg   MCHC 34.1 30.0 - 36.0 g/dL   RDW 15.2 11.5 - 15.5 %   Platelets 201 150 - 400 K/uL  Comprehensive metabolic panel     Status: Abnormal   Collection Time: 11/02/14  5:38 AM  Result Value Ref Range   Sodium 138 135 - 145 mmol/L   Potassium 3.4 (L) 3.5 - 5.1 mmol/L   Chloride 97 (L) 101 - 111 mmol/L   CO2 32 22 - 32 mmol/L   Glucose, Bld 115 (H) 65 - 99 mg/dL   BUN 19 6 - 20 mg/dL   Creatinine, Ser 0.98 0.44 - 1.00 mg/dL   Calcium 8.7 (L) 8.9 - 10.3 mg/dL   Total Protein 6.1 (L) 6.5 - 8.1 g/dL   Albumin 2.8 (L) 3.5 - 5.0 g/dL   AST 180 (H) 15 - 41 U/L   ALT 137 (H) 14 - 54 U/L   Alkaline Phosphatase 94 38 - 126 U/L   Total Bilirubin 2.6 (H) 0.3 - 1.2 mg/dL   GFR calc non Af Amer 49 (L) >60 mL/min   GFR calc Af Amer 57 (L) >60 mL/min    Comment: (NOTE) The eGFR has been calculated using the CKD EPI equation. This calculation has not been validated in all clinical situations. eGFR's persistently <60 mL/min signify possible Chronic Kidney Disease.    Anion gap 9 5 - 15  Lipase, blood     Status: Abnormal   Collection Time: 11/02/14  5:38 AM  Result Value Ref Range   Lipase <10 (L) 22 - 51 U/L  Comprehensive metabolic panel     Status: Abnormal   Collection Time:   11/03/14  5:33 AM  Result Value Ref Range   Sodium 136 135 - 145 mmol/L   Potassium 3.1 (L) 3.5 - 5.1 mmol/L   Chloride 96 (L) 101 - 111 mmol/L   CO2 33 (H) 22 - 32 mmol/L   Glucose, Bld 97 65 - 99 mg/dL   BUN 18 6 - 20 mg/dL   Creatinine, Ser 1.02 (H) 0.44 - 1.00 mg/dL   Calcium 8.3 (L) 8.9 - 10.3 mg/dL   Total Protein 6.2 (L) 6.5 - 8.1 g/dL   Albumin 2.9 (L)  3.5 - 5.0 g/dL   AST 92 (H) 15 - 41 U/L   ALT 91 (H) 14 - 54 U/L   Alkaline Phosphatase 95 38 - 126 U/L   Total Bilirubin 1.2 0.3 - 1.2 mg/dL   GFR calc non Af Amer 47 (L) >60 mL/min   GFR calc Af Amer 54 (L) >60 mL/min    Comment: (NOTE) The eGFR has been calculated using the CKD EPI equation. This calculation has not been validated in all clinical situations. eGFR's persistently <60 mL/min signify possible Chronic Kidney Disease.    Anion gap 7 5 - 15  CBC with Differential/Platelet     Status: Abnormal   Collection Time: 11/03/14  5:50 AM  Result Value Ref Range   WBC 18.9 (H) 4.0 - 10.5 K/uL   RBC 4.43 3.87 - 5.11 MIL/uL   Hemoglobin 13.1 12.0 - 15.0 g/dL   HCT 38.7 36.0 - 46.0 %   MCV 87.4 78.0 - 100.0 fL   MCH 29.6 26.0 - 34.0 pg   MCHC 33.9 30.0 - 36.0 g/dL   RDW 15.3 11.5 - 15.5 %   Platelets 186 150 - 400 K/uL   Neutrophils Relative % 86 (H) 43 - 77 %   Neutro Abs 16.2 (H) 1.7 - 7.7 K/uL   Lymphocytes Relative 6 (L) 12 - 46 %   Lymphs Abs 1.2 0.7 - 4.0 K/uL   Monocytes Relative 7 3 - 12 %   Monocytes Absolute 1.2 (H) 0.1 - 1.0 K/uL   Eosinophils Relative 1 0 - 5 %   Eosinophils Absolute 0.2 0.0 - 0.7 K/uL   Basophils Relative 0 0 - 1 %   Basophils Absolute 0.0 0.0 - 0.1 K/uL    ABGS No results for input(s): PHART, PO2ART, TCO2, HCO3 in the last 72 hours.  Invalid input(s): PCO2 CULTURES No results found for this or any previous visit (from the past 240 hour(s)). Studies/Results: Ct Abdomen Pelvis W Contrast  11/01/2014   CLINICAL DATA:  Mid abdominal pain.  EXAM: CT ABDOMEN AND PELVIS WITH CONTRAST  TECHNIQUE: Multidetector CT imaging of the abdomen and pelvis was performed using the standard protocol following bolus administration of intravenous contrast.  CONTRAST:  25mL OMNIPAQUE IOHEXOL 300 MG/ML SOLN, 100mL OMNIPAQUE IOHEXOL 300 MG/ML SOLN  COMPARISON:  CT 09/24/2013, MR 02/17/2013.  FINDINGS: There is mild dilatation of intrahepatic and extrahepatic bile  ducts without significant interval change. There is no obstructing mass or calculus. There is a small amount of air within the biliary system consistent with prior sphincterotomy.  There are no focal liver lesions. There are normal appearances of the spleen. There is mild unchanged pancreatic duct dilatation. The pancreatic parenchyma is unremarkable. There is mild benign thickening of the adrenals, unchanged. There are normal appearances of the kidneys. Urinary bladder is unremarkable. Uterus and ovaries are unremarkable. Bowel is remarkable only for minimal uncomplicated colonic diverticulosis and a small hiatal hernia. The   abdominal aorta is normal in caliber with moderate atherosclerotic calcification.  No acute inflammatory changes are evident in the abdomen or pelvis.  There is a fat containing midline ventral hernia, supraumbilical. There is severe lumbar degenerative disc change. No significant skeletal lesion is evident. There is no significant abnormality in the lower chest.  There is mild metal artifact in the pelvis due to right hip arthroplasty hardware.  IMPRESSION: 1. No acute findings are evident in the abdomen or pelvis. 2. Unchanged dilatation of the bile ducts and pancreatic duct without evidence of obstructing mass or stone. Pneumobilia is present, consistent with prior sphincterotomy. 3. Small hiatal hernia. 4. Mild diverticulosis. 5. Fat containing supraumbilical midline ventral hernia.   Electronically Signed   By: Andreas Newport M.D.   On: 11/01/2014 22:04   Mr 3d Recon At Scanner  11/02/2014   CLINICAL DATA:  Upper abdominal pain, nausea, status post cholecystectomy. Elevated LFTs. Dilated common duct.  EXAM: MRI ABDOMEN WITHOUT AND WITH CONTRAST (INCLUDING MRCP)  TECHNIQUE: Multiplanar multisequence MR imaging of the abdomen was performed both before and after the administration of intravenous contrast. Heavily T2-weighted images of the biliary and pancreatic ducts were obtained, and  three-dimensional MRCP images were rendered by post processing.  CONTRAST:  12 mL Multihance IV  COMPARISON:  CT abdomen pelvis dated 11/01/2014  FINDINGS: Motion degraded images.  Lower chest:  Mild scarring/ atelectasis in the left lower lobe.  Hepatobiliary: Liver is within normal limits. No suspicious/enhancing hepatic lesions.  Status post cholecystectomy.  Mild central intrahepatic ductal dilatation. Dilated common duct, measuring 12 mm centrally (series 4/image 23). 4 mm distal CBD stone (series 5/ image 31).  Pancreas: Mild diffuse pancreatic ductal dilatation, measuring 4 mm. No associated pancreatic head mass or atrophy.  Spleen: Within normal limits.  Adrenals/Urinary Tract: Mild thickening of the left adrenal gland. Right adrenal gland is within normal limits.  Kidneys are within normal limits.  No hydronephrosis.  Stomach/Bowel: Stomach and visualized bowel is grossly unremarkable.  Vascular/Lymphatic: No evidence of abdominal aortic aneurysm.  No suspicious abdominal lymphadenopathy.  Other: No abdominal ascites.  Musculoskeletal: S-shaped thoracolumbar scoliosis with associated degenerative changes.  IMPRESSION: Motion degraded images.  Status post cholecystectomy. Mild intrahepatic and extrahepatic ductal dilatation. Common duct measures 12 mm.  Choledocholithiasis with associated 4 mm distal CBD stone.  Mild diffuse pancreatic ductal dilatation. No associated pancreatic head mass.   Electronically Signed   By: Julian Hy M.D.   On: 11/02/2014 10:39   Mr Jeananne Rama W/wo Cm/mrcp  11/02/2014   CLINICAL DATA:  Upper abdominal pain, nausea, status post cholecystectomy. Elevated LFTs. Dilated common duct.  EXAM: MRI ABDOMEN WITHOUT AND WITH CONTRAST (INCLUDING MRCP)  TECHNIQUE: Multiplanar multisequence MR imaging of the abdomen was performed both before and after the administration of intravenous contrast. Heavily T2-weighted images of the biliary and pancreatic ducts were obtained, and  three-dimensional MRCP images were rendered by post processing.  CONTRAST:  12 mL Multihance IV  COMPARISON:  CT abdomen pelvis dated 11/01/2014  FINDINGS: Motion degraded images.  Lower chest:  Mild scarring/ atelectasis in the left lower lobe.  Hepatobiliary: Liver is within normal limits. No suspicious/enhancing hepatic lesions.  Status post cholecystectomy.  Mild central intrahepatic ductal dilatation. Dilated common duct, measuring 12 mm centrally (series 4/image 23). 4 mm distal CBD stone (series 5/ image 31).  Pancreas: Mild diffuse pancreatic ductal dilatation, measuring 4 mm. No associated pancreatic head mass or atrophy.  Spleen: Within normal limits.  Adrenals/Urinary Tract: Mild thickening  of the left adrenal gland. Right adrenal gland is within normal limits.  Kidneys are within normal limits.  No hydronephrosis.  Stomach/Bowel: Stomach and visualized bowel is grossly unremarkable.  Vascular/Lymphatic: No evidence of abdominal aortic aneurysm.  No suspicious abdominal lymphadenopathy.  Other: No abdominal ascites.  Musculoskeletal: S-shaped thoracolumbar scoliosis with associated degenerative changes.  IMPRESSION: Motion degraded images.  Status post cholecystectomy. Mild intrahepatic and extrahepatic ductal dilatation. Common duct measures 12 mm.  Choledocholithiasis with associated 4 mm distal CBD stone.  Mild diffuse pancreatic ductal dilatation. No associated pancreatic head mass.   Electronically Signed   By: Julian Hy M.D.   On: 11/02/2014 10:39    Medications: I have reviewed the patient's current medications.  Assesment:   Active Problems:   RUQ pain   Abdominal pain   Elevated LFTs   Hyperbilirubinemia    Plan:  Medications reviewed GI consult appreciated Continue Iv antibiotics As per GI plan       Lesa Vandall 11/03/2014, 7:52 AM

## 2014-11-03 NOTE — Progress Notes (Signed)
Subjective:  Complains of ruq pain into shoulder, nausea this morning. No vomiting.   Objective: Vital signs in last 24 hours: Temp:  [98.7 F (37.1 C)-100.5 F (38.1 C)] 98.7 F (37.1 C) (08/31 0430) Pulse Rate:  [76-90] 76 (08/31 0430) Resp:  [18-20] 20 (08/31 0430) BP: (99-122)/(48-58) 122/58 mmHg (08/31 0430) SpO2:  [91 %-99 %] 99 % (08/31 0430) Last BM Date: 11/01/14 General:   Alert,  Well-developed, well-nourished, pleasant and cooperative in NAD Head:  Normocephalic and atraumatic. Eyes:  Sclera clear, no icterus.  Abdomen:  Soft, RUQ tenderness, moderate. Nondistended.    Extremities:  Without clubbing, deformity or edema. Neurologic:  Alert and  oriented x4;  grossly normal neurologically. Skin:  Intact without significant lesions or rashes. Psych:  Alert and cooperative. Normal mood and affect.  Intake/Output from previous day: 08/30 0701 - 08/31 0700 In: 2207.5 [P.O.:660; I.V.:1147.5; IV Piggyback:400] Out: 1350 [Urine:1350] Intake/Output this shift:    Lab Results: CBC  Recent Labs  11/01/14 1810 11/02/14 0538 11/03/14 0550  WBC 12.6* 22.7* 18.9*  HGB 15.9* 14.0 13.1  HCT 46.3* 41.1 38.7  MCV 87.0 87.8 87.4  PLT 233 201 186   BMET  Recent Labs  11/01/14 1810 11/02/14 0538 11/03/14 0533  NA 141 138 136  K 3.9 3.4* 3.1*  CL 96* 97* 96*  CO2 33* 32 33*  GLUCOSE 85 115* 97  BUN 22* 19 18  CREATININE 1.07* 0.98 1.02*  CALCIUM 9.6 8.7* 8.3*   LFTs  Recent Labs  11/01/14 1810 11/02/14 0538 11/03/14 0533  BILITOT 2.2* 2.6* 1.2  ALKPHOS 113 94 95  AST 287* 180* 92*  ALT 152* 137* 91*  PROT 7.4 6.1* 6.2*  ALBUMIN 3.7 2.8* 2.9*    Recent Labs  11/01/14 1810 11/02/14 0538  LIPASE 20* <10*   PT/INR No results for input(s): LABPROT, INR in the last 72 hours.    Imaging Studies: Ct Abdomen Pelvis W Contrast  11/01/2014   CLINICAL DATA:  Mid abdominal pain.  EXAM: CT ABDOMEN AND PELVIS WITH CONTRAST  TECHNIQUE: Multidetector CT  imaging of the abdomen and pelvis was performed using the standard protocol following bolus administration of intravenous contrast.  CONTRAST:  25mL OMNIPAQUE IOHEXOL 300 MG/ML SOLN, OMNIPAQUE IOHEXOL 300 MG/ML SOLN  COMPARISON:  CT 09/24/2013, MR 02/17/2013.  FINDINGS: There is mild dilatation of intrahepatic and extrahepatic bile ducts without significant interval change. There is no obstructing mass or calculus. There is a small amount of air within the biliary system consistent with prior sphincterotomy.  There are no focal liver lesions. There are normal appearances of the spleen. There is mild unchanged pancreatic duct dilatation. The pancreatic parenchyma is unremarkable. There is mild benign thickening of the adrenals, unchanged. There are normal appearances of the kidneys. Urinary bladder is unremarkable. Uterus and ovaries are unremarkable. Bowel is remarkable only for minimal uncomplicated colonic diverticulosis and a small hiatal hernia. The abdominal aorta is normal in caliber with moderate atherosclerotic calcification.  No acute inflammatory changes are evident in the abdomen or pelvis.  There is a fat containing midline ventral hernia, supraumbilical. There is severe lumbar degenerative disc change. No significant skeletal lesion is evident. There is no significant abnormality in the lower chest.  There is mild metal artifact in the pelvis due to right hip arthroplasty hardware.  IMPRESSION: 1. No acute findings are evident in the abdomen or pelvis. 2. Unchanged dilatation of the bile ducts and pancreatic duct without evidence of obstructing mass or  stone. Pneumobilia is present, consistent with prior sphincterotomy. 3. Small hiatal hernia. 4. Mild diverticulosis. 5. Fat containing supraumbilical midline ventral hernia.   Electronically Signed   By: Ellery Plunk M.D.   On: 11/01/2014 22:04   Mr 3d Recon At Scanner  11/02/2014   CLINICAL DATA:  Upper abdominal pain, nausea, status post  cholecystectomy. Elevated LFTs. Dilated common duct.  EXAM: MRI ABDOMEN WITHOUT AND WITH CONTRAST (INCLUDING MRCP)  TECHNIQUE: Multiplanar multisequence MR imaging of the abdomen was performed both before and after the administration of intravenous contrast. Heavily T2-weighted images of the biliary and pancreatic ducts were obtained, and three-dimensional MRCP images were rendered by post processing.  CONTRAST:  12 mL Multihance IV  COMPARISON:  CT abdomen pelvis dated 11/01/2014  FINDINGS: Motion degraded images.  Lower chest:  Mild scarring/ atelectasis in the left lower lobe.  Hepatobiliary: Liver is within normal limits. No suspicious/enhancing hepatic lesions.  Status post cholecystectomy.  Mild central intrahepatic ductal dilatation. Dilated common duct, measuring 12 mm centrally (series 4/image 23). 4 mm distal CBD stone (series 5/ image 31).  Pancreas: Mild diffuse pancreatic ductal dilatation, measuring 4 mm. No associated pancreatic head mass or atrophy.  Spleen: Within normal limits.  Adrenals/Urinary Tract: Mild thickening of the left adrenal gland. Right adrenal gland is within normal limits.  Kidneys are within normal limits.  No hydronephrosis.  Stomach/Bowel: Stomach and visualized bowel is grossly unremarkable.  Vascular/Lymphatic: No evidence of abdominal aortic aneurysm.  No suspicious abdominal lymphadenopathy.  Other: No abdominal ascites.  Musculoskeletal: S-shaped thoracolumbar scoliosis with associated degenerative changes.  IMPRESSION: Motion degraded images.  Status post cholecystectomy. Mild intrahepatic and extrahepatic ductal dilatation. Common duct measures 12 mm.  Choledocholithiasis with associated 4 mm distal CBD stone.  Mild diffuse pancreatic ductal dilatation. No associated pancreatic head mass.   Electronically Signed   By: Charline Bills M.D.   On: 11/02/2014 10:39   Mr Roe Coombs W/wo Cm/mrcp  11/02/2014   CLINICAL DATA:  Upper abdominal pain, nausea, status post  cholecystectomy. Elevated LFTs. Dilated common duct.  EXAM: MRI ABDOMEN WITHOUT AND WITH CONTRAST (INCLUDING MRCP)  TECHNIQUE: Multiplanar multisequence MR imaging of the abdomen was performed both before and after the administration of intravenous contrast. Heavily T2-weighted images of the biliary and pancreatic ducts were obtained, and three-dimensional MRCP images were rendered by post processing.  CONTRAST:  12 mL Multihance IV  COMPARISON:  CT abdomen pelvis dated 11/01/2014  FINDINGS: Motion degraded images.  Lower chest:  Mild scarring/ atelectasis in the left lower lobe.  Hepatobiliary: Liver is within normal limits. No suspicious/enhancing hepatic lesions.  Status post cholecystectomy.  Mild central intrahepatic ductal dilatation. Dilated common duct, measuring 12 mm centrally (series 4/image 23). 4 mm distal CBD stone (series 5/ image 31).  Pancreas: Mild diffuse pancreatic ductal dilatation, measuring 4 mm. No associated pancreatic head mass or atrophy.  Spleen: Within normal limits.  Adrenals/Urinary Tract: Mild thickening of the left adrenal gland. Right adrenal gland is within normal limits.  Kidneys are within normal limits.  No hydronephrosis.  Stomach/Bowel: Stomach and visualized bowel is grossly unremarkable.  Vascular/Lymphatic: No evidence of abdominal aortic aneurysm.  No suspicious abdominal lymphadenopathy.  Other: No abdominal ascites.  Musculoskeletal: S-shaped thoracolumbar scoliosis with associated degenerative changes.  IMPRESSION: Motion degraded images.  Status post cholecystectomy. Mild intrahepatic and extrahepatic ductal dilatation. Common duct measures 12 mm.  Choledocholithiasis with associated 4 mm distal CBD stone.  Mild diffuse pancreatic ductal dilatation. No associated pancreatic head mass.  Rosann [phone] [id number] HospitalMaryruth Ha ADTEXTTAG>TTAG>[id number] [id number] >[id number] [id number] [id number] TEXTTAG>[facility] S 66AM

## 2014-11-03 NOTE — Consult Note (Signed)
Patient seen, chart reviewed.  Full consult to follow.  Will talk to niece.

## 2014-11-04 DIAGNOSIS — K8050 Calculus of bile duct without cholangitis or cholecystitis without obstruction: Secondary | ICD-10-CM

## 2014-11-04 DIAGNOSIS — R1011 Right upper quadrant pain: Secondary | ICD-10-CM | POA: Insufficient documentation

## 2014-11-04 DIAGNOSIS — K8033 Calculus of bile duct with acute cholangitis with obstruction: Secondary | ICD-10-CM | POA: Insufficient documentation

## 2014-11-04 LAB — CBC
HEMATOCRIT: 38.7 % (ref 36.0–46.0)
HEMOGLOBIN: 13.2 g/dL (ref 12.0–15.0)
MCH: 30 pg (ref 26.0–34.0)
MCHC: 34.1 g/dL (ref 30.0–36.0)
MCV: 88 fL (ref 78.0–100.0)
Platelets: 188 10*3/uL (ref 150–400)
RBC: 4.4 MIL/uL (ref 3.87–5.11)
RDW: 15.3 % (ref 11.5–15.5)
WBC: 16.1 10*3/uL — ABNORMAL HIGH (ref 4.0–10.5)

## 2014-11-04 LAB — COMPREHENSIVE METABOLIC PANEL
ALBUMIN: 2.9 g/dL — AB (ref 3.5–5.0)
ALK PHOS: 90 U/L (ref 38–126)
ALT: 59 U/L — AB (ref 14–54)
ANION GAP: 9 (ref 5–15)
AST: 48 U/L — AB (ref 15–41)
BILIRUBIN TOTAL: 0.9 mg/dL (ref 0.3–1.2)
BUN: 9 mg/dL (ref 6–20)
CALCIUM: 8.5 mg/dL — AB (ref 8.9–10.3)
CO2: 30 mmol/L (ref 22–32)
CREATININE: 0.76 mg/dL (ref 0.44–1.00)
Chloride: 103 mmol/L (ref 101–111)
GFR calc Af Amer: >60 mL/min (ref >60)
GFR calc non Af Amer: >60 mL/min (ref >60)
GLUCOSE: 93 mg/dL (ref 65–99)
Potassium: 3.6 mmol/L (ref 3.5–5.1)
Sodium: 142 mmol/L (ref 135–145)
TOTAL PROTEIN: 6.3 g/dL — AB (ref 6.5–8.1)

## 2014-11-04 LAB — TYPE AND SCREEN
ABO/RH(D): A POS
Antibody Screen: NEGATIVE

## 2014-11-04 MED ORDER — GABAPENTIN 100 MG PO CAPS
200.0000 mg | ORAL_CAPSULE | Freq: Three times a day (TID) | ORAL | Status: DC
  Administered 2014-11-04 – 2014-11-09 (×14): 200 mg via ORAL
  Filled 2014-11-04 (×16): qty 2

## 2014-11-04 NOTE — Progress Notes (Signed)
Subjective: Patient feels better. Is tolerating diet well. Abdominal pain decreased.  Objective: Vital signs in last 24 hours: Temp:  [98.6 F (37 C)-100.5 F (38.1 C)] 99.4 F (37.4 C) (09/01 0627) Pulse Rate:  [83-89] 89 (09/01 0627) Resp:  [18] 18 (09/01 0627) BP: (115-123)/(51-59) 115/55 mmHg (09/01 0627) SpO2:  [95 %-97 %] 95 % (09/01 0627) Last BM Date: 11/01/14  Intake/Output from previous day: 08/31 0701 - 09/01 0700 In: 3950 [P.O.:660; I.V.:2890; IV Piggyback:400] Out: 1850 [Urine:1850] Intake/Output this shift: Total I/O In: -  Out: 500 [Urine:500]  General appearance: alert, cooperative and no distress GI: soft, non-tender; bowel sounds normal; no masses,  no organomegaly  Lab Results:   Recent Labs  11/03/14 0550 11/04/14 0534  WBC 18.9* 16.1*  HGB 13.1 13.2  HCT 38.7 38.7  PLT 186 188   BMET  Recent Labs  11/03/14 0533 11/04/14 0534  NA 136 142  K 3.1* 3.6  CL 96* 103  CO2 33* 30  GLUCOSE 97 93  BUN 18 9  CREATININE 1.02* 0.76  CALCIUM 8.3* 8.5*   PT/INR No results for input(s): LABPROT, INR in the last 72 hours.  Studies/Results: Mr 3d Recon At Scanner  11/02/2014   CLINICAL DATA:  Upper abdominal pain, nausea, status post cholecystectomy. Elevated LFTs. Dilated common duct.  EXAM: MRI ABDOMEN WITHOUT AND WITH CONTRAST (INCLUDING MRCP)  TECHNIQUE: Multiplanar multisequence MR imaging of the abdomen was performed both before and after the administration of intravenous contrast. Heavily T2-weighted images of the biliary and pancreatic ducts were obtained, and three-dimensional MRCP images were rendered by post processing.  CONTRAST:  12 mL Multihance IV  COMPARISON:  CT abdomen pelvis dated 11/01/2014  FINDINGS: Motion degraded images.  Lower chest:  Mild scarring/ atelectasis in the left lower lobe.  Hepatobiliary: Liver is within normal limits. No suspicious/enhancing hepatic lesions.  Status post cholecystectomy.  Mild central intrahepatic  ductal dilatation. Dilated common duct, measuring 12 mm centrally (series 4/image 23). 4 mm distal CBD stone (series 5/ image 31).  Pancreas: Mild diffuse pancreatic ductal dilatation, measuring 4 mm. No associated pancreatic head mass or atrophy.  Spleen: Within normal limits.  Adrenals/Urinary Tract: Mild thickening of the left adrenal gland. Right adrenal gland is within normal limits.  Kidneys are within normal limits.  No hydronephrosis.  Stomach/Bowel: Stomach and visualized bowel is grossly unremarkable.  Vascular/Lymphatic: No evidence of abdominal aortic aneurysm.  No suspicious abdominal lymphadenopathy.  Other: No abdominal ascites.  Musculoskeletal: S-shaped thoracolumbar scoliosis with associated degenerative changes.  IMPRESSION: Motion degraded images.  Status post cholecystectomy. Mild intrahepatic and extrahepatic ductal dilatation. Common duct measures 12 mm.  Choledocholithiasis with associated 4 mm distal CBD stone.  Mild diffuse pancreatic ductal dilatation. No associated pancreatic head mass.   Electronically Signed   By: Charline Bills M.D.   On: 11/02/2014 10:39   Mr Roe Coombs W/wo Cm/mrcp  11/02/2014   CLINICAL DATA:  Upper abdominal pain, nausea, status post cholecystectomy. Elevated LFTs. Dilated common duct.  EXAM: MRI ABDOMEN WITHOUT AND WITH CONTRAST (INCLUDING MRCP)  TECHNIQUE: Multiplanar multisequence MR imaging of the abdomen was performed both before and after the administration of intravenous contrast. Heavily T2-weighted images of the biliary and pancreatic ducts were obtained, and three-dimensional MRCP images were rendered by post processing.  CONTRAST:  12 mL Multihance IV  COMPARISON:  CT abdomen pelvis dated 11/01/2014  FINDINGS: Motion degraded images.  Lower chest:  Mild scarring/ atelectasis in the left lower lobe.  Hepatobiliary: Liver is  within normal limits. No suspicious/enhancing hepatic lesions.  Status post cholecystectomy.  Mild central intrahepatic ductal  dilatation. Dilated common duct, measuring 12 mm centrally (series 4/image 23). 4 mm distal CBD stone (series 5/ image 31).  Pancreas: Mild diffuse pancreatic ductal dilatation, measuring 4 mm. No associated pancreatic head mass or atrophy.  Spleen: Within normal limits.  Adrenals/Urinary Tract: Mild thickening of the left adrenal gland. Right adrenal gland is within normal limits.  Kidneys are within normal limits.  No hydronephrosis.  Stomach/Bowel: Stomach and visualized bowel is grossly unremarkable.  Vascular/Lymphatic: No evidence of abdominal aortic aneurysm.  No suspicious abdominal lymphadenopathy.  Other: No abdominal ascites.  Musculoskeletal: S-shaped thoracolumbar scoliosis with associated degenerative changes.  IMPRESSION: Motion degraded images.  Status post cholecystectomy. Mild intrahepatic and extrahepatic ductal dilatation. Common duct measures 12 mm.  Choledocholithiasis with associated 4 mm distal CBD stone.  Mild diffuse pancreatic ductal dilatation. No associated pancreatic head mass.   Electronically Signed   By: Charline Bills M.D.   On: 11/02/2014 10:39    Anti-infectives: Anti-infectives    Start     Dose/Rate Route Frequency Ordered Stop   11/02/14 1200  Ampicillin-Sulbactam (UNASYN) 3 g in sodium chloride 0.9 % 100 mL IVPB     3 g 100 mL/hr over 60 Minutes Intravenous Every 6 hours 11/02/14 1009        Assessment/Plan: Impression: Choledocholithiasis, liver enzyme tests improving, white blood cell count improving. Plan: We will need choledochoduodenostomy in the future. I would like her LFTs to normalize as well as her white blood cell count. Would like to defer surgery into next week so that it is elective in nature. Will repeat blood blood cell count in a.m. Patient may be able to be discharged soon and scheduled as an outpatient.  LOS: 1 day    Delmar Dondero A 11/04/2014

## 2014-11-04 NOTE — Progress Notes (Signed)
Subjective: Patient feels better. No nausea or vomiting. Her LFT is improving.  Objective: Vital signs in last 24 hours: Temp:  [98.6 F (37 C)-100.5 F (38.1 C)] 99.4 F (37.4 C) (09/01 0627) Pulse Rate:  [83-89] 89 (09/01 0627) Resp:  [18] 18 (09/01 0627) BP: (115-123)/(51-59) 115/55 mmHg (09/01 0627) SpO2:  [95 %-97 %] 95 % (09/01 0627) Weight change:  Last BM Date: 11/01/14  Intake/Output from previous day: 08/31 0701 - 09/01 0700 In: 3950 [P.O.:660; I.V.:2890; IV Piggyback:400] Out: 1850 [Urine:1850]  PHYSICAL EXAM General appearance: alert, fatigued and no distress Resp: clear to auscultation bilaterally Cardio: S1, S2 normal GI: soft and lax, bowel sound ++, RUQ tenderness Extremities: extremities normal, atraumatic, no cyanosis or edema  Lab Results:  Results for orders placed or performed during the hospital encounter of 11/01/14 (from the past 48 hour(s))  Comprehensive metabolic panel     Status: Abnormal   Collection Time: 11/03/14  5:33 AM  Result Value Ref Range   Sodium 136 135 - 145 mmol/L   Potassium 3.1 (L) 3.5 - 5.1 mmol/L   Chloride 96 (L) 101 - 111 mmol/L   CO2 33 (H) 22 - 32 mmol/L   Glucose, Bld 97 65 - 99 mg/dL   BUN 18 6 - 20 mg/dL   Creatinine, Ser 1.02 (H) 0.44 - 1.00 mg/dL   Calcium 8.3 (L) 8.9 - 10.3 mg/dL   Total Protein 6.2 (L) 6.5 - 8.1 g/dL   Albumin 2.9 (L) 3.5 - 5.0 g/dL   AST 92 (H) 15 - 41 U/L   ALT 91 (H) 14 - 54 U/L   Alkaline Phosphatase 95 38 - 126 U/L   Total Bilirubin 1.2 0.3 - 1.2 mg/dL   GFR calc non Af Amer 47 (L) >60 mL/min   GFR calc Af Amer 54 (L) >60 mL/min    Comment: (NOTE) The eGFR has been calculated using the CKD EPI equation. This calculation has not been validated in all clinical situations. eGFR's persistently <60 mL/min signify possible Chronic Kidney Disease.    Anion gap 7 5 - 15  CBC with Differential/Platelet     Status: Abnormal   Collection Time: 11/03/14  5:50 AM  Result Value Ref Range   WBC  18.9 (H) 4.0 - 10.5 K/uL   RBC 4.43 3.87 - 5.11 MIL/uL   Hemoglobin 13.1 12.0 - 15.0 g/dL   HCT 38.7 36.0 - 46.0 %   MCV 87.4 78.0 - 100.0 fL   MCH 29.6 26.0 - 34.0 pg   MCHC 33.9 30.0 - 36.0 g/dL   RDW 15.3 11.5 - 15.5 %   Platelets 186 150 - 400 K/uL   Neutrophils Relative % 86 (H) 43 - 77 %   Neutro Abs 16.2 (H) 1.7 - 7.7 K/uL   Lymphocytes Relative 6 (L) 12 - 46 %   Lymphs Abs 1.2 0.7 - 4.0 K/uL   Monocytes Relative 7 3 - 12 %   Monocytes Absolute 1.2 (H) 0.1 - 1.0 K/uL   Eosinophils Relative 1 0 - 5 %   Eosinophils Absolute 0.2 0.0 - 0.7 K/uL   Basophils Relative 0 0 - 1 %   Basophils Absolute 0.0 0.0 - 0.1 K/uL  Surgical pcr screen     Status: None   Collection Time: 11/03/14  9:00 AM  Result Value Ref Range   MRSA, PCR NEGATIVE NEGATIVE   Staphylococcus aureus NEGATIVE NEGATIVE    Comment:        The Xpert SA  Assay (FDA approved for NASAL specimens in patients over 35 years of age), is one component of a comprehensive surveillance program.  Test performance has been validated by [facility] for patients greater than or equal to 22 year old. It is not intended to diagnose infection nor to guide or monitor treatment.   CBC     Status: Abnormal   Collection Time: 11/04/14  5:34 AM  Result Value Ref Range   WBC 16.1 (H) 4.0 - 10.5 K/uL   RBC 4.40 3.87 - 5.11 MIL/uL   Hemoglobin 13.2 12.0 - 15.0 g/dL   HCT 38.7 36.0 - 46.0 %   MCV 88.0 78.0 - 100.0 fL   MCH 30.0 26.0 - 34.0 pg   MCHC 34.1 30.0 - 36.0 g/dL   RDW 15.3 11.5 - 15.5 %   Platelets 188 150 - 400 K/uL  Comprehensive metabolic panel     Status: Abnormal   Collection Time: 11/04/14  5:34 AM  Result Value Ref Range   Sodium 142 135 - 145 mmol/L   Potassium 3.6 3.5 - 5.1 mmol/L   Chloride 103 101 - 111 mmol/L   CO2 30 22 - 32 mmol/L   Glucose, Bld 93 65 - 99 mg/dL   BUN 9 6 - 20 mg/dL   Creatinine, Ser 0.76 0.44 - 1.00 mg/dL   Calcium 8.5 (L) 8.9 - 10.3 mg/dL   Total Protein 6.3 (L) 6.5 - 8.1 g/dL    Albumin 2.9 (L) 3.5 - 5.0 g/dL   AST 48 (H) 15 - 41 U/L   ALT 59 (H) 14 - 54 U/L   Alkaline Phosphatase 90 38 - 126 U/L   Total Bilirubin 0.9 0.3 - 1.2 mg/dL   GFR calc non Af Amer >60 >60 mL/min   GFR calc Af Amer >60 >60 mL/min    Comment: (NOTE) The eGFR has been calculated using the CKD EPI equation. This calculation has not been validated in all clinical situations. eGFR's persistently <60 mL/min signify possible Chronic Kidney Disease.    Anion gap 9 5 - 15    ABGS No results for input(s): PHART, PO2ART, TCO2, HCO3 in the last 72 hours.  Invalid input(s): PCO2 CULTURES Recent Results (from the past 240 hour(s))  Surgical pcr screen     Status: None   Collection Time: 11/03/14  9:00 AM  Result Value Ref Range Status   MRSA, PCR NEGATIVE NEGATIVE Final   Staphylococcus aureus NEGATIVE NEGATIVE Final    Comment:        The Xpert SA Assay (FDA approved for NASAL specimens in patients over 61 years of age), is one component of a comprehensive surveillance program.  Test performance has been validated by [facility] for patients greater than or equal to 26 year old. It is not intended to diagnose infection nor to guide or monitor treatment.    Studies/Results: Mr 3d Recon At Scanner  11/02/2014   CLINICAL DATA:  Upper abdominal pain, nausea, status post cholecystectomy. Elevated LFTs. Dilated common duct.  EXAM: MRI ABDOMEN WITHOUT AND WITH CONTRAST (INCLUDING MRCP)  TECHNIQUE: Multiplanar multisequence MR imaging of the abdomen was performed both before and after the administration of intravenous contrast. Heavily T2-weighted images of the biliary and pancreatic ducts were obtained, and three-dimensional MRCP images were rendered by post processing.  CONTRAST:  12 mL Multihance IV  COMPARISON:  CT abdomen pelvis dated 11/01/2014  FINDINGS: Motion degraded images.  Lower chest:  Mild scarring/ atelectasis in the left lower lobe.  Hepatobiliary: Liver is within normal  limits. No suspicious/enhancing hepatic lesions.  Status post cholecystectomy.  Mild central intrahepatic ductal dilatation. Dilated common duct, measuring 12 mm centrally (series 4/image 23). 4 mm distal CBD stone (series 5/ image 31).  Pancreas: Mild diffuse pancreatic ductal dilatation, measuring 4 mm. No associated pancreatic head mass or atrophy.  Spleen: Within normal limits.  Adrenals/Urinary Tract: Mild thickening of the left adrenal gland. Right adrenal gland is within normal limits.  Kidneys are within normal limits.  No hydronephrosis.  Stomach/Bowel: Stomach and visualized bowel is grossly unremarkable.  Vascular/Lymphatic: No evidence of abdominal aortic aneurysm.  No suspicious abdominal lymphadenopathy.  Other: No abdominal ascites.  Musculoskeletal: S-shaped thoracolumbar scoliosis with associated degenerative changes.  IMPRESSION: Motion degraded images.  Status post cholecystectomy. Mild intrahepatic and extrahepatic ductal dilatation. Common duct measures 12 mm.  Choledocholithiasis with associated 4 mm distal CBD stone.  Mild diffuse pancreatic ductal dilatation. No associated pancreatic head mass.   Electronically Signed   By: Julian Hy M.D.   On: 11/02/2014 10:39   Mr Jeananne Rama W/wo Cm/mrcp  11/02/2014   CLINICAL DATA:  Upper abdominal pain, nausea, status post cholecystectomy. Elevated LFTs. Dilated common duct.  EXAM: MRI ABDOMEN WITHOUT AND WITH CONTRAST (INCLUDING MRCP)  TECHNIQUE: Multiplanar multisequence MR imaging of the abdomen was performed both before and after the administration of intravenous contrast. Heavily T2-weighted images of the biliary and pancreatic ducts were obtained, and three-dimensional MRCP images were rendered by post processing.  CONTRAST:  12 mL Multihance IV  COMPARISON:  CT abdomen pelvis dated 11/01/2014  FINDINGS: Motion degraded images.  Lower chest:  Mild scarring/ atelectasis in the left lower lobe.  Hepatobiliary: Liver is within normal limits. No  suspicious/enhancing hepatic lesions.  Status post cholecystectomy.  Mild central intrahepatic ductal dilatation. Dilated common duct, measuring 12 mm centrally (series 4/image 23). 4 mm distal CBD stone (series 5/ image 31).  Pancreas: Mild diffuse pancreatic ductal dilatation, measuring 4 mm. No associated pancreatic head mass or atrophy.  Spleen: Within normal limits.  Adrenals/Urinary Tract: Mild thickening of the left adrenal gland. Right adrenal gland is within normal limits.  Kidneys are within normal limits.  No hydronephrosis.  Stomach/Bowel: Stomach and visualized bowel is grossly unremarkable.  Vascular/Lymphatic: No evidence of abdominal aortic aneurysm.  No suspicious abdominal lymphadenopathy.  Other: No abdominal ascites.  Musculoskeletal: S-shaped thoracolumbar scoliosis with associated degenerative changes.  IMPRESSION: Motion degraded images.  Status post cholecystectomy. Mild intrahepatic and extrahepatic ductal dilatation. Common duct measures 12 mm.  Choledocholithiasis with associated 4 mm distal CBD stone.  Mild diffuse pancreatic ductal dilatation. No associated pancreatic head mass.   Electronically Signed   By: Julian Hy M.D.   On: 11/02/2014 10:39    Medications: I have reviewed the patient's current medications.  Assesment:   Active Problems:   RUQ pain   Abdominal pain   Elevated LFTs   Hyperbilirubinemia   Choledocholithiasis with cholecystitis    Plan:  Medications reviewed Continue Iv antibiotics Will monitor LFT and BMP As per GI plan     LOS: 1 day   Brianne Maina 11/04/2014, 8:02 AM

## 2014-11-04 NOTE — Progress Notes (Signed)
Subjective:  Continues to have RUQ pain. Nausea better.   Objective: Vital signs in last 24 hours: Temp:  [98.6 F (37 C)-100.5 F (38.1 C)] 99.4 F (37.4 C) (09/01 0627) Pulse Rate:  [83-89] 89 (09/01 0627) Resp:  [18] 18 (09/01 0627) BP: (115-123)/(51-59) 115/55 mmHg (09/01 0627) SpO2:  [95 %-97 %] 95 % (09/01 0627) Last BM Date: 11/01/14 General:   Alert,  Well-developed, well-nourished, pleasant and cooperative in NAD Head:  Normocephalic and atraumatic. Eyes:  Sclera clear, no icterus.  Abdomen:  Soft, ruq tenderness. Nondistended.  Normal bowel sounds, without guarding, and without rebound.   Extremities:  Without clubbing, deformity or edema. Neurologic:  Alert and  oriented x4;  grossly normal neurologically. Skin:  Intact without significant lesions or rashes. Psych:  Alert and cooperative. Normal mood and affect.  Intake/Output from previous day: 08/31 0701 - 09/01 0700 In: 3950 [P.O.:660; I.V.:2890; IV Piggyback:400] Out: 1850 [Urine:1850] Intake/Output this shift: Total I/O In: -  Out: 500 [Urine:500]  Lab Results: CBC  Recent Labs  11/02/14 0538 11/03/14 0550 11/04/14 0534  WBC 22.7* 18.9* 16.1*  HGB 14.0 13.1 13.2  HCT 41.1 38.7 38.7  MCV 87.8 87.4 88.0  PLT 201 186 188   BMET  Recent Labs  11/02/14 0538 11/03/14 0533 11/04/14 0534  NA 138 136 142  K 3.4* 3.1* 3.6  CL 97* 96* 103  CO2 32 33* 30  GLUCOSE 115* 97 93  BUN 19 18 9   CREATININE 0.98 1.02* 0.76  CALCIUM 8.7* 8.3* 8.5*   LFTs  Recent Labs  11/02/14 0538 11/03/14 0533 11/04/14 0534  BILITOT 2.6* 1.2 0.9  ALKPHOS 94 95 90  AST 180* 92* 48*  ALT 137* 91* 59*  PROT 6.1* 6.2* 6.3*  ALBUMIN 2.8* 2.9* 2.9*    Recent Labs  11/01/14 1810 11/02/14 0538  LIPASE 20* <10*   PT/INR No results for input(s): LABPROT, INR in the last 72 hours.    Imaging Studies: Ct Abdomen Pelvis W Contrast  11/01/2014   CLINICAL DATA:  Mid abdominal pain.  EXAM: CT ABDOMEN AND PELVIS WITH  CONTRAST  TECHNIQUE: Multidetector CT imaging of the abdomen and pelvis was performed using the standard protocol following bolus administration of intravenous contrast.  CONTRAST:  25mL OMNIPAQUE IOHEXOL 300 MG/ML SOLN, OMNIPAQUE IOHEXOL 300 MG/ML SOLN  COMPARISON:  CT 09/24/2013, MR 02/17/2013.  FINDINGS: There is mild dilatation of intrahepatic and extrahepatic bile ducts without significant interval change. There is no obstructing mass or calculus. There is a small amount of air within the biliary system consistent with prior sphincterotomy.  There are no focal liver lesions. There are normal appearances of the spleen. There is mild unchanged pancreatic duct dilatation. The pancreatic parenchyma is unremarkable. There is mild benign thickening of the adrenals, unchanged. There are normal appearances of the kidneys. Urinary bladder is unremarkable. Uterus and ovaries are unremarkable. Bowel is remarkable only for minimal uncomplicated colonic diverticulosis and a small hiatal hernia. The abdominal aorta is normal in caliber with moderate atherosclerotic calcification.  No acute inflammatory changes are evident in the abdomen or pelvis.  There is a fat containing midline ventral hernia, supraumbilical. There is severe lumbar degenerative disc change. No significant skeletal lesion is evident. There is no significant abnormality in the lower chest.  There is mild metal artifact in the pelvis due to right hip arthroplasty hardware.  IMPRESSION: 1. No acute findings are evident in the abdomen or pelvis. 2. Unchanged dilatation of the bile ducts  and pancreatic duct without evidence of obstructing mass or stone. Pneumobilia is present, consistent with prior sphincterotomy. 3. Small hiatal hernia. 4. Mild diverticulosis. 5. Fat containing supraumbilical midline ventral hernia.   Electronically Signed   By: Ellery Plunk M.D.   On: 11/01/2014 22:04   Mr 3d Recon At Scanner  11/02/2014   CLINICAL DATA:  Upper  abdominal pain, nausea, status post cholecystectomy. Elevated LFTs. Dilated common duct.  EXAM: MRI ABDOMEN WITHOUT AND WITH CONTRAST (INCLUDING MRCP)  TECHNIQUE: Multiplanar multisequence MR imaging of the abdomen was performed both before and after the administration of intravenous contrast. Heavily T2-weighted images of the biliary and pancreatic ducts were obtained, and three-dimensional MRCP images were rendered by post processing.  CONTRAST:  12 mL Multihance IV  COMPARISON:  CT abdomen pelvis dated 11/01/2014  FINDINGS: Motion degraded images.  Lower chest:  Mild scarring/ atelectasis in the left lower lobe.  Hepatobiliary: Liver is within normal limits. No suspicious/enhancing hepatic lesions.  Status post cholecystectomy.  Mild central intrahepatic ductal dilatation. Dilated common duct, measuring 12 mm centrally (series 4/image 23). 4 mm distal CBD stone (series 5/ image 31).  Pancreas: Mild diffuse pancreatic ductal dilatation, measuring 4 mm. No associated pancreatic head mass or atrophy.  Spleen: Within normal limits.  Adrenals/Urinary Tract: Mild thickening of the left adrenal gland. Right adrenal gland is within normal limits.  Kidneys are within normal limits.  No hydronephrosis.  Stomach/Bowel: Stomach and visualized bowel is grossly unremarkable.  Vascular/Lymphatic: No evidence of abdominal aortic aneurysm.  No suspicious abdominal lymphadenopathy.  Other: No abdominal ascites.  Musculoskeletal: S-shaped thoracolumbar scoliosis with associated degenerative changes.  IMPRESSION: Motion degraded images.  Status post cholecystectomy. Mild intrahepatic and extrahepatic ductal dilatation. Common duct measures 12 mm.  Choledocholithiasis with associated 4 mm distal CBD stone.  Mild diffuse pancreatic ductal dilatation. No associated pancreatic head mass.   Electronically Signed   By: Charline Bills M.D.   On: 11/02/2014 10:39   Mr Roe Coombs W/wo Cm/mrcp  11/02/2014   CLINICAL DATA:  Upper abdominal  pain, nausea, status post cholecystectomy. Elevated LFTs. Dilated common duct.  EXAM: MRI ABDOMEN WITHOUT AND WITH CONTRAST (INCLUDING MRCP)  TECHNIQUE: Multiplanar multisequence MR imaging of the abdomen was performed both before and after the administration of intravenous contrast. Heavily T2-weighted images of the biliary and pancreatic ducts were obtained, and three-dimensional MRCP images were rendered by post processing.  CONTRAST:  12 mL Multihance IV  COMPARISON:  CT abdomen pelvis dated 11/01/2014  FINDINGS: Motion degraded images.  Lower chest:  Mild scarring/ atelectasis in the left lower lobe.  Hepatobiliary: Liver is within normal limits. No suspicious/enhancing hepatic lesions.  Status post cholecystectomy.  Mild central intrahepatic ductal dilatation. Dilated common duct, measuring 12 mm centrally (series 4/image 23). 4 mm distal CBD stone (series 5/ image 31).  Pancreas: Mild diffuse pancreatic ductal dilatation, measuring 4 mm. No associated pancreatic head mass or atrophy.  Spleen: Within normal limits.  Adrenals/Urinary Tract: Mild thickening of the left adrenal gland. Right adrenal gland is within normal limits.  Kidneys are within normal limits.  No hydronephrosis.  Stomach/Bowel: Stomach and visualized bowel is grossly unremarkable.  Vascular/Lymphatic: No evidence of abdominal aortic aneurysm.  No suspicious abdominal lymphadenopathy.  Other: No abdominal ascites.  Musculoskeletal: S-shaped thoracolumbar scoliosis with associated degenerative changes.  IMPRESSION: Motion degraded images.  Status post cholecystectomy. Mild intrahepatic and extrahepatic ductal dilatation. Common duct measures 12 mm.  Choledocholithiasis with associated 4 mm distal CBD stone.  Mild diffuse  pancreatic ductal dilatation. No associated pancreatic head mass.   Electronically Signed   By: Charline Bills M.D.   On: 11/02/2014 10:39  [2 weeks]   Assessment: 81 year old female with history of multiple ERCPs in  the past secondary to choledocholithiasis, presenting again with acute RUQ pain, N/V, elevated bilirubin and transaminases, and leukocytosis. Suspected cholangitis now on empiric Unasyn. MRI/MRCP shows 4mm CBD stone. Last ERCP with stone extraction in 2014. Concern regarding multiple ERCPs has been raised in the past, with discussion of possible choledochojejunostomy at time of last ERCP. Surgery consult appreciated. LFTs improving but clinically patient continues to have RUQ pain.   As of note Lovenox will be discontinued in anticipation of possible surgical intervention.   Plan: 1. Continue Unasyn. If discharged prior to surgery, would recommend continue antibiotic coverage as outpatient.  2. Follow labs in AM.   Leanna Battles. Dixon [facility]os [facility] [phone] 9/1/201610:00 AM     LOS: 1 day

## 2014-11-05 ENCOUNTER — Inpatient Hospital Stay (HOSPITAL_COMMUNITY): Payer: Medicare Other

## 2014-11-05 LAB — COMPREHENSIVE METABOLIC PANEL
ALBUMIN: 2.7 g/dL — AB (ref 3.5–5.0)
ALK PHOS: 141 U/L — AB (ref 38–126)
ALT: 82 U/L — AB (ref 14–54)
AST: 103 U/L — ABNORMAL HIGH (ref 15–41)
Anion gap: 5 (ref 5–15)
CALCIUM: 7.9 mg/dL — AB (ref 8.9–10.3)
CO2: 28 mmol/L (ref 22–32)
CREATININE: 0.69 mg/dL (ref 0.44–1.00)
Chloride: 105 mmol/L (ref 101–111)
GFR calc non Af Amer: >60 mL/min (ref >60)
GLUCOSE: 97 mg/dL (ref 65–99)
Potassium: 3.7 mmol/L (ref 3.5–5.1)
SODIUM: 138 mmol/L (ref 135–145)
Total Bilirubin: 1.5 mg/dL — ABNORMAL HIGH (ref 0.3–1.2)
Total Protein: 5.9 g/dL — ABNORMAL LOW (ref 6.5–8.1)

## 2014-11-05 LAB — CBC
HCT: 38.3 % (ref 36.0–46.0)
Hemoglobin: 13.1 g/dL (ref 12.0–15.0)
MCH: 29.9 pg (ref 26.0–34.0)
MCHC: 34.2 g/dL (ref 30.0–36.0)
MCV: 87.4 fL (ref 78.0–100.0)
PLATELETS: 171 10*3/uL (ref 150–400)
RBC: 4.38 MIL/uL (ref 3.87–5.11)
RDW: 15.2 % (ref 11.5–15.5)
WBC: 9.1 10*3/uL (ref 4.0–10.5)

## 2014-11-05 NOTE — Progress Notes (Signed)
Subjective: States occasional nausea which is intermittent, overall improved, resolves temporarily with antiemetics. Still some abdominal pain RUQ. Pain medication helps with pain but does not totally alleviate it. Overall feels improved. States she does not want to go home before the procedure due to concerns about her daughter's ability to care for her.  Objective: Vital signs in last 24 hours: Temp:  [98.2 F (36.8 C)-98.9 F (37.2 C)] 98.2 F (36.8 C) (09/02 0531) Pulse Rate:  [82-87] 87 (09/02 0531) Resp:  [18] 18 (09/02 0531) BP: (124-128)/(53-59) 127/53 mmHg (09/02 0531) SpO2:  [92 %-98 %] 92 % (09/02 1203) Last BM Date: 11/01/14 General:   Alert and oriented, pleasant Head:  Normocephalic and atraumatic. Eyes:  No icterus, sclera clear. Conjuctiva pink.  Heart:  S1, S2 present, no murmurs noted.  Lungs: Clear to auscultation bilaterally, without wheezing, rales, or rhonchi.  Abdomen:  Bowel sounds present, soft, non-distended. No increased tenderness to palpation. No HSM or hernias noted. No rebound or guarding. No masses appreciated  Extremities:  Without clubbing or edema. Neurologic:  Alert and  oriented x4;  grossly normal neurologically. Skin:  Warm and dry, intact without significant lesions.  Psych:  Alert and cooperative. Normal mood and affect.  Intake/Output from previous day: 09/01 0701 - 09/02 0700 In: 240 [P.O.:240] Out: 1700 [Urine:1700] Intake/Output this shift: Total I/O In: 120 [P.O.:120] Out: -   Lab Results:  Recent Labs  11/03/14 0550 11/04/14 0534 11/05/14 0603  WBC 18.9* 16.1* 9.1  HGB 13.1 13.2 13.1  HCT 38.7 38.7 38.3  PLT 186 188 171   BMET  Recent Labs  11/03/14 0533 11/04/14 0534 11/05/14 0603  NA 136 142 138  K 3.1* 3.6 3.7  CL 96* 103 105  CO2 33* 30 28  GLUCOSE 97 93 97  BUN 18 9 <5*  CREATININE 1.02* 0.76 0.69  CALCIUM 8.3* 8.5* 7.9*   LFT  Recent Labs  11/03/14 0533 11/04/14 0534 11/05/14 0603  PROT  6.2* 6.3* 5.9*  ALBUMIN 2.9* 2.9* 2.7*  AST 92* 48* 103*  ALT 91* 59* 82*  ALKPHOS 95 90 141*  BILITOT 1.2 0.9 1.5*   PT/INR No results for input(s): LABPROT, INR in the last 72 hours. Hepatitis Panel No results for input(s): HEPBSAG, HCVAB, HEPAIGM, HEPBIGM in the last 72 hours.   Studies/Results: Dg Chest Port 1 View  11/05/2014   CLINICAL DATA:  Patient with hypertension.  EXAM: PORTABLE CHEST - 1 VIEW  COMPARISON:  Chest radiograph 02/12/2014  FINDINGS: Stable cardiac and mediastinal contours, enlarged. Low lung volumes. Bandlike opacity within the right lower lung. No definite pleural effusion or pneumothorax. Pulmonary vascular redistribution.  IMPRESSION: Cardiomegaly.  Pulmonary vascular redistribution.  Bandlike opacity right lung base favored to represent atelectasis.   Electronically Signed   By: Lovey Newcomer M.D.   On: 11/05/2014 11:36    Assessment: 81 year old female with history of multiple ERCPs in the past secondary to choledocholithiasis, presenting again with acute RUQ pain, N/V, elevated bilirubin and transaminases, and leukocytosis. Suspected cholangitis now on empiric Unasyn. MRI/MRCP shows 10m CBD stone. Last ERCP with stone extraction in 2014. Concern regarding multiple ERCPs has been raised in the past, with discussion of possible choledochojejunostomy at time of last ERCP. Planned surgical intervention next week.  Pain and nausea improving, medications help. She had an initial improvement in LFTs after admission. Today there was a slight bump in her bili, AST/ALT, though still improved from admission levels. Alk phos increased to 141.  Symptomatically improved with no negative changes noted. Is still on antibiotics.    Plan: 1. Continue IV antibiotics, continue after d/c if patient is discharged prior to procedure next week 2. Recheck CMP (already ordered) for liver function changes 3. Continue supportive measures including pain medication and  antiemetics.   Walden Field, AGNP-C Adult & Gerontological Nurse Practitioner [facility]     LOS: 2 days    11/05/2014, 1:27 PM

## 2014-11-05 NOTE — Progress Notes (Signed)
Subjective: Patient claims her nausea and vomiting is better. Still she has RUQ pain. Her bilirubin level increased todaay. No fever or chills.  Objective: Vital signs in last 24 hours: Temp:  [98.2 F (36.8 C)-98.9 F (37.2 C)] 98.2 F (36.8 C) (09/02 0531) Pulse Rate:  [82-87] 87 (09/02 0531) Resp:  [18] 18 (09/02 0531) BP: (124-128)/(53-59) 127/53 mmHg (09/02 0531) SpO2:  [97 %-98 %] 97 % (09/02 0531) Weight change:  Last BM Date: 11/01/14  Intake/Output from previous day: 09/01 0701 - 09/02 0700 In: 240 [P.O.:240] Out: 1700 [Urine:1700]  PHYSICAL EXAM General appearance: alert, fatigued and no distress Resp: clear to auscultation bilaterally Cardio: S1, S2 normal GI: soft and lax, bowel sound ++, RUQ tenderness Extremities: extremities normal, atraumatic, no cyanosis or edema  Lab Results:  Results for orders placed or performed during the hospital encounter of 11/01/14 (from the past 48 hour(s))  Surgical pcr screen     Status: None   Collection Time: 11/03/14  9:00 AM  Result Value Ref Range   MRSA, PCR NEGATIVE NEGATIVE   Staphylococcus aureus NEGATIVE NEGATIVE    Comment:        The Xpert SA Assay (FDA approved for NASAL specimens in patients over 7 years of age), is one component of a comprehensive surveillance program.  Test performance has been validated by [facility] for patients greater than or equal to 62 year old. It is not intended to diagnose infection nor to guide or monitor treatment.   CBC     Status: Abnormal   Collection Time: 11/04/14  5:34 AM  Result Value Ref Range   WBC 16.1 (H) 4.0 - 10.5 K/uL   RBC 4.40 3.87 - 5.11 MIL/uL   Hemoglobin 13.2 12.0 - 15.0 g/dL   HCT 38.7 36.0 - 46.0 %   MCV 88.0 78.0 - 100.0 fL   MCH 30.0 26.0 - 34.0 pg   MCHC 34.1 30.0 - 36.0 g/dL   RDW 15.3 11.5 - 15.5 %   Platelets 188 150 - 400 K/uL  Comprehensive metabolic panel     Status: Abnormal   Collection Time: 11/04/14  5:34 AM  Result Value Ref  Range   Sodium 142 135 - 145 mmol/L   Potassium 3.6 3.5 - 5.1 mmol/L   Chloride 103 101 - 111 mmol/L   CO2 30 22 - 32 mmol/L   Glucose, Bld 93 65 - 99 mg/dL   BUN 9 6 - 20 mg/dL   Creatinine, Ser 0.76 0.44 - 1.00 mg/dL   Calcium 8.5 (L) 8.9 - 10.3 mg/dL   Total Protein 6.3 (L) 6.5 - 8.1 g/dL   Albumin 2.9 (L) 3.5 - 5.0 g/dL   AST 48 (H) 15 - 41 U/L   ALT 59 (H) 14 - 54 U/L   Alkaline Phosphatase 90 38 - 126 U/L   Total Bilirubin 0.9 0.3 - 1.2 mg/dL   GFR calc non Af Amer >60 >60 mL/min   GFR calc Af Amer >60 >60 mL/min    Comment: (NOTE) The eGFR has been calculated using the CKD EPI equation. This calculation has not been validated in all clinical situations. eGFR's persistently <60 mL/min signify possible Chronic Kidney Disease.    Anion gap 9 5 - 15  Type and screen     Status: None   Collection Time: 11/04/14  5:34 AM  Result Value Ref Range   ABO/RH(D) A POS    Antibody Screen NEG    Sample Expiration 11/07/2014  Comprehensive metabolic panel     Status: Abnormal   Collection Time: 11/05/14  6:03 AM  Result Value Ref Range   Sodium 138 135 - 145 mmol/L   Potassium 3.7 3.5 - 5.1 mmol/L   Chloride 105 101 - 111 mmol/L   CO2 28 22 - 32 mmol/L   Glucose, Bld 97 65 - 99 mg/dL   BUN <5 (L) 6 - 20 mg/dL   Creatinine, Ser 0.69 0.44 - 1.00 mg/dL   Calcium 7.9 (L) 8.9 - 10.3 mg/dL   Total Protein 5.9 (L) 6.5 - 8.1 g/dL   Albumin 2.7 (L) 3.5 - 5.0 g/dL   AST 103 (H) 15 - 41 U/L   ALT 82 (H) 14 - 54 U/L   Alkaline Phosphatase 141 (H) 38 - 126 U/L   Total Bilirubin 1.5 (H) 0.3 - 1.2 mg/dL   GFR calc non Af Amer >60 >60 mL/min   GFR calc Af Amer >60 >60 mL/min    Comment: (NOTE) The eGFR has been calculated using the CKD EPI equation. This calculation has not been validated in all clinical situations. eGFR's persistently <60 mL/min signify possible Chronic Kidney Disease.    Anion gap 5 5 - 15  CBC     Status: None   Collection Time: 11/05/14  6:03 AM  Result Value  Ref Range   WBC 9.1 4.0 - 10.5 K/uL   RBC 4.38 3.87 - 5.11 MIL/uL   Hemoglobin 13.1 12.0 - 15.0 g/dL   HCT 38.3 36.0 - 46.0 %   MCV 87.4 78.0 - 100.0 fL   MCH 29.9 26.0 - 34.0 pg   MCHC 34.2 30.0 - 36.0 g/dL   RDW 15.2 11.5 - 15.5 %   Platelets 171 150 - 400 K/uL    ABGS No results for input(s): PHART, PO2ART, TCO2, HCO3 in the last 72 hours.  Invalid input(s): PCO2 CULTURES Recent Results (from the past 240 hour(s))  Surgical pcr screen     Status: None   Collection Time: 11/03/14  9:00 AM  Result Value Ref Range Status   MRSA, PCR NEGATIVE NEGATIVE Final   Staphylococcus aureus NEGATIVE NEGATIVE Final    Comment:        The Xpert SA Assay (FDA approved for NASAL specimens in patients over 36 years of age), is one component of a comprehensive surveillance program.  Test performance has been validated by [facility] for patients greater than or equal to 18 year old. It is not intended to diagnose infection nor to guide or monitor treatment.    Studies/Results: No results found.  Medications: I have reviewed the patient's current medications.  Assesment:   Active Problems:   RUQ pain   Abdominal pain   Elevated LFTs   Hyperbilirubinemia   Choledocholithiasis with cholecystitis   Right upper quadrant pain   Calculus of bile duct with acute cholangitis with obstruction    Plan:  Medications reviewed Continue IV antibiotics Will monitorCBC/BMP/LFT Surgical consult appreciated     LOS: 2 days   Shonika Kolasinski 11/05/2014, 7:46 AM

## 2014-11-05 NOTE — Progress Notes (Signed)
Labs noted. The patient appears comfortable. Continue current management. Will continue to monitor white blood cell count as well as liver enzyme tests.

## 2014-11-05 NOTE — Care Management Important Message (Signed)
Important Message  Patient Details  Name: JOYELLE SIEDLECKI MRN: [id number] Date of Birth: 1920-10-08   Medicare Important Message Given:  Yes-second notification given    Malcolm Metro, RN 11/05/2014, 11:49 AM

## 2014-11-05 NOTE — Progress Notes (Signed)
Late Entry 11/05/2014 1345 Notified Dr. Felecia Shelling that the patient c/o of pain in her knees and that she request her tramadol that she takes at home.  MD stated that the patient is on Morphine and that should be enough for her pain.  Patient was notified that the MD says she should continue what she has prescribed at this time.  She verbalized  Understanding.

## 2014-11-05 NOTE — Care Management Note (Signed)
Case Management Note  Patient Details  Name: MAIMOUNA RONDEAU MRN: [id number] Date of Birth: 08/12/1920  Expected Discharge Date:                  Expected Discharge Plan:  Home/Self Care  In-House Referral:  NA  Discharge planning Services  CM Consult  Post Acute Care Choice:  NA Choice offered to:  NA  DME Arranged:    DME Agency:     HH Arranged:    HH Agency:     Status of Service:  Completed, signed off  Medicare Important Message Given:  Yes-second notification given Date Medicare IM Given:    Medicare IM give by:    Date Additional Medicare IM Given:    Additional Medicare Important Message give by:     If discussed at Long Length of Stay Meetings, dates discussed:    Additional Comments: Pt still plans to return home with self care at DC. Pt still managing pain and LFT levels. No CM needs anticipated.  Malcolm Metro, RN 11/05/2014, 11:49 AM

## 2014-11-06 LAB — COMPREHENSIVE METABOLIC PANEL
ALT: 60 U/L — ABNORMAL HIGH (ref 14–54)
ANION GAP: 5 (ref 5–15)
AST: 52 U/L — ABNORMAL HIGH (ref 15–41)
Albumin: 2.6 g/dL — ABNORMAL LOW (ref 3.5–5.0)
Alkaline Phosphatase: 134 U/L — ABNORMAL HIGH (ref 38–126)
BUN: <5 mg/dL — ABNORMAL LOW (ref 6–20)
CHLORIDE: 108 mmol/L (ref 101–111)
CO2: 27 mmol/L (ref 22–32)
Calcium: 8 mg/dL — ABNORMAL LOW (ref 8.9–10.3)
Creatinine, Ser: 0.69 mg/dL (ref 0.44–1.00)
Glucose, Bld: 89 mg/dL (ref 65–99)
POTASSIUM: 3.2 mmol/L — AB (ref 3.5–5.1)
Sodium: 140 mmol/L (ref 135–145)
Total Bilirubin: 0.8 mg/dL (ref 0.3–1.2)
Total Protein: 5.8 g/dL — ABNORMAL LOW (ref 6.5–8.1)

## 2014-11-06 MED ORDER — KCL IN DEXTROSE-NACL 40-5-0.45 MEQ/L-%-% IV SOLN
INTRAVENOUS | Status: DC
  Administered 2014-11-06 – 2014-11-07 (×2): via INTRAVENOUS

## 2014-11-06 MED ORDER — POTASSIUM CHLORIDE CRYS ER 20 MEQ PO TBCR: 40.0000 meq | EXTENDED_RELEASE_TABLET | Freq: Two times a day (BID) | ORAL | Status: DC

## 2014-11-06 MED ORDER — POLYETHYLENE GLYCOL 3350 17 G PO PACK
17.0000 g | PACK | Freq: Every day | ORAL | Status: DC | PRN
  Administered 2014-11-06: 17 g via ORAL
  Filled 2014-11-06: qty 1

## 2014-11-06 NOTE — Progress Notes (Signed)
Patient has been scheduled for choledochoduodenostomy for 11/09/2014. Hypokalemia has been noted and is being addressed.

## 2014-11-06 NOTE — Progress Notes (Signed)
Subjective: Patient feel better. She is tolerating her diet. She is planned for surgery by Dr. Arnoldo Morale.  Objective: Vital signs in last 24 hours: Temp:  [99.2 F (37.3 C)-99.3 F (37.4 C)] 99.3 F (37.4 C) (09/03 0521) Pulse Rate:  [74-81] 74 (09/03 0521) Resp:  [18] 18 (09/03 0521) BP: (124-137)/(56-73) 124/56 mmHg (09/03 0521) SpO2:  [92 %-97 %] 97 % (09/03 0521) Weight change:  Last BM Date: 11/01/14  Intake/Output from previous day: 09/02 0701 - 09/03 0700 In: 3567.5 [P.O.:120; I.V.:3447.5] Out: -   PHYSICAL EXAM General appearance: alert, fatigued and no distress Resp: clear to auscultation bilaterally Cardio: S1, S2 normal GI: soft and lax, bowel sound ++, RUQ tenderness Extremities: extremities normal, atraumatic, no cyanosis or edema  Lab Results:  Results for orders placed or performed during the hospital encounter of 11/01/14 (from the past 48 hour(s))  Comprehensive metabolic panel     Status: Abnormal   Collection Time: 11/05/14  6:03 AM  Result Value Ref Range   Sodium 138 135 - 145 mmol/L   Potassium 3.7 3.5 - 5.1 mmol/L   Chloride 105 101 - 111 mmol/L   CO2 28 22 - 32 mmol/L   Glucose, Bld 97 65 - 99 mg/dL   BUN <5 (L) 6 - 20 mg/dL   Creatinine, Ser 0.69 0.44 - 1.00 mg/dL   Calcium 7.9 (L) 8.9 - 10.3 mg/dL   Total Protein 5.9 (L) 6.5 - 8.1 g/dL   Albumin 2.7 (L) 3.5 - 5.0 g/dL   AST 103 (H) 15 - 41 U/L   ALT 82 (H) 14 - 54 U/L   Alkaline Phosphatase 141 (H) 38 - 126 U/L   Total Bilirubin 1.5 (H) 0.3 - 1.2 mg/dL   GFR calc non Af Amer >60 >60 mL/min   GFR calc Af Amer >60 >60 mL/min    Comment: (NOTE) The eGFR has been calculated using the CKD EPI equation. This calculation has not been validated in all clinical situations. eGFR's persistently <60 mL/min signify possible Chronic Kidney Disease.    Anion gap 5 5 - 15  CBC     Status: None   Collection Time: 11/05/14  6:03 AM  Result Value Ref Range   WBC 9.1 4.0 - 10.5 K/uL   RBC 4.38 3.87 -  5.11 MIL/uL   Hemoglobin 13.1 12.0 - 15.0 g/dL   HCT 38.3 36.0 - 46.0 %   MCV 87.4 78.0 - 100.0 fL   MCH 29.9 26.0 - 34.0 pg   MCHC 34.2 30.0 - 36.0 g/dL   RDW 15.2 11.5 - 15.5 %   Platelets 171 150 - 400 K/uL  Comprehensive metabolic panel     Status: Abnormal   Collection Time: 11/06/14  5:59 AM  Result Value Ref Range   Sodium 140 135 - 145 mmol/L   Potassium 3.2 (L) 3.5 - 5.1 mmol/L   Chloride 108 101 - 111 mmol/L   CO2 27 22 - 32 mmol/L   Glucose, Bld 89 65 - 99 mg/dL   BUN <5 (L) 6 - 20 mg/dL   Creatinine, Ser 0.69 0.44 - 1.00 mg/dL   Calcium 8.0 (L) 8.9 - 10.3 mg/dL   Total Protein 5.8 (L) 6.5 - 8.1 g/dL   Albumin 2.6 (L) 3.5 - 5.0 g/dL   AST 52 (H) 15 - 41 U/L   ALT 60 (H) 14 - 54 U/L   Alkaline Phosphatase 134 (H) 38 - 126 U/L   Total Bilirubin 0.8 0.3 - 1.2  mg/dL   GFR calc non Af Amer >60 >60 mL/min   GFR calc Af Amer >60 >60 mL/min    Comment: (NOTE) The eGFR has been calculated using the CKD EPI equation. This calculation has not been validated in all clinical situations. eGFR's persistently <60 mL/min signify possible Chronic Kidney Disease.    Anion gap 5 5 - 15    ABGS No results for input(s): PHART, PO2ART, TCO2, HCO3 in the last 72 hours.  Invalid input(s): PCO2 CULTURES Recent Results (from the past 240 hour(s))  Surgical pcr screen     Status: None   Collection Time: 11/03/14  9:00 AM  Result Value Ref Range Status   MRSA, PCR NEGATIVE NEGATIVE Final   Staphylococcus aureus NEGATIVE NEGATIVE Final    Comment:        The Xpert SA Assay (FDA approved for NASAL specimens in patients over 19 years of age), is one component of a comprehensive surveillance program.  Test performance has been validated by [facility] for patients greater than or equal to 51 year old. It is not intended to diagnose infection nor to guide or monitor treatment.    Studies/Results: Dg Chest Port 1 View  11/05/2014   CLINICAL DATA:  Patient with hypertension.   EXAM: PORTABLE CHEST - 1 VIEW  COMPARISON:  Chest radiograph 02/12/2014  FINDINGS: Stable cardiac and mediastinal contours, enlarged. Low lung volumes. Bandlike opacity within the right lower lung. No definite pleural effusion or pneumothorax. Pulmonary vascular redistribution.  IMPRESSION: Cardiomegaly.  Pulmonary vascular redistribution.  Bandlike opacity right lung base favored to represent atelectasis.   Electronically Signed   By: Lovey Newcomer M.D.   On: 11/05/2014 11:36    Medications: I have reviewed the patient's current medications.  Assesment:   Active Problems:   RUQ pain   Abdominal pain   Elevated LFTs   Hyperbilirubinemia   Choledocholithiasis with cholecystitis   Right upper quadrant pain   Calculus of bile duct with acute cholangitis with obstruction    Plan:  Medications reviewed Continue IV antibiotics Will monitorCBC/BMP/LFT As per surgery plan.     LOS: 3 days   Lori Bishop 11/06/2014, 8:50 AM

## 2014-11-07 LAB — BASIC METABOLIC PANEL
Anion gap: 7 (ref 5–15)
CALCIUM: 8.3 mg/dL — AB (ref 8.9–10.3)
CHLORIDE: 107 mmol/L (ref 101–111)
CO2: 27 mmol/L (ref 22–32)
CREATININE: 0.71 mg/dL (ref 0.44–1.00)
GFR calc Af Amer: >60 mL/min (ref >60)
GFR calc non Af Amer: >60 mL/min (ref >60)
Glucose, Bld: 85 mg/dL (ref 65–99)
Potassium: 3.7 mmol/L (ref 3.5–5.1)
SODIUM: 141 mmol/L (ref 135–145)

## 2014-11-07 MED ORDER — OXYCODONE HCL 5 MG PO TABS
5.0000 mg | ORAL_TABLET | Freq: Four times a day (QID) | ORAL | Status: DC | PRN
  Administered 2014-11-07 – 2014-11-09 (×6): 5 mg via ORAL
  Filled 2014-11-07 (×6): qty 1

## 2014-11-07 MED ORDER — SODIUM CHLORIDE 0.9 % IV SOLN
Freq: Once | INTRAVENOUS | Status: AC
Start: 2014-11-07 — End: 2014-11-09
  Administered 2014-11-09: 16:00:00 via INTRAVENOUS

## 2014-11-07 NOTE — Progress Notes (Signed)
Pt has loss of IV access. MD notified and made aware. Received verbal order from Dr. Janna Arch for PICC line and oxycodone IR 5 mg Q6H PRN. Informed by pharmacist at [facility] that UNASYN cannot be given IM or PO. Per MD okay to miss unasyn dose.

## 2014-11-07 NOTE — Progress Notes (Signed)
Patient has choledocholithiasis surgery scheduled for 9/6 she has had hypokalemia which is corrected diminished protein and albumen currently on Unasyn 3 g IV every 6 hours Lori Bishop WRU:[id number] DOB: 1920-10-16 DOA: 11/01/2014 PCP: Rosita Fire, MD             Physical Exam: Blood pressure 116/58, pulse 68, temperature 98.7 F (37.1 C), temperature source Oral, resp. rate 20, height 5' (1.524 Bishop), weight 132 lb 6.4 oz (60.056 kg), SpO2 94 %. lungs clear to A&P no rales wheeze rhonchi heart regular rhythm no S3 or S4 no heaves thrills rubs abdomen soft mild right upper quadrant tenderness no guarding or rebound or masses no megaly   Investigations:  Recent Results (from the past 240 hour(s))  Surgical pcr screen     Status: None   Collection Time: 11/03/14  9:00 AM  Result Value Ref Range Status   MRSA, PCR NEGATIVE NEGATIVE Final   Staphylococcus aureus NEGATIVE NEGATIVE Final    Comment:        The Xpert SA Assay (FDA approved for NASAL specimens in patients over 47 years of age), is one component of a comprehensive surveillance program.  Test performance has been validated by [facility] for patients greater than or equal to 20 year old. It is not intended to diagnose infection nor to guide or monitor treatment.      Basic Metabolic Panel:  Recent Labs  11/06/14 0559 11/07/14 0555  NA 140 141  K 3.2* 3.7  CL 108 107  CO2 27 27  GLUCOSE 89 85  BUN <5* <5*  CREATININE 0.69 0.71  CALCIUM 8.0* 8.3*   Liver Function Tests:  Recent Labs  11/05/14 0603 11/06/14 0559  AST 103* 52*  ALT 82* 60*  ALKPHOS 141* 134*  BILITOT 1.5* 0.8  PROT 5.9* 5.8*  ALBUMIN 2.7* 2.6*     CBC:  Recent Labs  11/05/14 0603  WBC 9.1  HGB 13.1  HCT 38.3  MCV 87.4  PLT 171    No results found.    Medications:  Impression: Hypokalemia  Active Problems:   RUQ pain   Abdominal pain   Elevated LFTs   Hyperbilirubinemia   Choledocholithiasis with  cholecystitis   Right upper quadrant pain   Calculus of bile duct with acute cholangitis with obstruction     Plan: Check be met and hepatic profile Monday a.Bishop. continue Unasyn 3 g IV every 6 hours surgery scheduled for Tuesday  Consultants: Dr. Aviva Signs general surgery    Procedures   Antibiotics: Unasyn 3 g IV every 6 h                  Code Status: Full  Family Communication:    Disposition Plan see plan above  Time spent: 30 minutes   LOS: 4 days   Lori Bishop   11/07/2014, 12:49 PM

## 2014-11-08 LAB — BASIC METABOLIC PANEL
ANION GAP: 5 (ref 5–15)
BUN: <5 mg/dL — ABNORMAL LOW (ref 6–20)
CALCIUM: 8.3 mg/dL — AB (ref 8.9–10.3)
CO2: 27 mmol/L (ref 22–32)
CREATININE: 0.7 mg/dL (ref 0.44–1.00)
Chloride: 109 mmol/L (ref 101–111)
Glucose, Bld: 77 mg/dL (ref 65–99)
Potassium: 3.8 mmol/L (ref 3.5–5.1)
SODIUM: 141 mmol/L (ref 135–145)

## 2014-11-08 LAB — CBC
HCT: 37.7 % (ref 36.0–46.0)
HEMOGLOBIN: 12.7 g/dL (ref 12.0–15.0)
MCH: 29.1 pg (ref 26.0–34.0)
MCHC: 33.7 g/dL (ref 30.0–36.0)
MCV: 86.5 fL (ref 78.0–100.0)
Platelets: 252 10*3/uL (ref 150–400)
RBC: 4.36 MIL/uL (ref 3.87–5.11)
RDW: 15.3 % (ref 11.5–15.5)
WBC: 11 10*3/uL — AB (ref 4.0–10.5)

## 2014-11-08 LAB — HEPATIC FUNCTION PANEL
ALBUMIN: 2.5 g/dL — AB (ref 3.5–5.0)
ALT: 44 U/L (ref 14–54)
AST: 34 U/L (ref 15–41)
Alkaline Phosphatase: 137 U/L — ABNORMAL HIGH (ref 38–126)
BILIRUBIN TOTAL: 0.7 mg/dL (ref 0.3–1.2)
Bilirubin, Direct: 0.2 mg/dL (ref 0.1–0.5)
Indirect Bilirubin: 0.5 mg/dL (ref 0.3–0.9)
TOTAL PROTEIN: 5.7 g/dL — AB (ref 6.5–8.1)

## 2014-11-08 LAB — PREPARE RBC (CROSSMATCH)

## 2014-11-08 MED ORDER — CHLORHEXIDINE GLUCONATE 4 % EX LIQD
1.0000 "application " | Freq: Once | CUTANEOUS | Status: AC
  Administered 2014-11-08: 1 via TOPICAL
  Filled 2014-11-08: qty 15

## 2014-11-08 NOTE — Progress Notes (Signed)
Subjective: Patient is doing better. Her symptoms has improved. She is scheduled for surgery tomorrow.s.  Objective: Vital signs in last 24 hours: Temp:  [98.7 F (37.1 C)-99.2 F (37.3 C)] 98.7 F (37.1 C) (09/05 0515) Pulse Rate:  [67-80] 67 (09/05 0515) Resp:  [20] 20 (09/05 0515) BP: (125-133)/(60-81) 132/81 mmHg (09/05 0515) SpO2:  [94 %-96 %] 94 % (09/05 0515) Weight change:  Last BM Date: 11/07/14  Intake/Output from previous day: 09/04 0701 - 09/05 0700 In: 960 [P.O.:960] Out: -   PHYSICAL EXAM General appearance: alert, fatigued and no distress Resp: clear to auscultation bilaterally Cardio: S1, S2 normal GI: soft and lax, bowel sound ++, RUQ tenderness Extremities: extremities normal, atraumatic, no cyanosis or edema  Lab Results:  Results for orders placed or performed during the hospital encounter of 11/01/14 (from the past 48 hour(s))  Basic metabolic panel     Status: Abnormal   Collection Time: 11/07/14  5:55 AM  Result Value Ref Range   Sodium 141 135 - 145 mmol/L   Potassium 3.7 3.5 - 5.1 mmol/L   Chloride 107 101 - 111 mmol/L   CO2 27 22 - 32 mmol/L   Glucose, Bld 85 65 - 99 mg/dL   BUN <5 (L) 6 - 20 mg/dL   Creatinine, Ser 0.71 0.44 - 1.00 mg/dL   Calcium 8.3 (L) 8.9 - 10.3 mg/dL   GFR calc non Af Amer >60 >60 mL/min   GFR calc Af Amer >60 >60 mL/min    Comment: (NOTE) The eGFR has been calculated using the CKD EPI equation. This calculation has not been validated in all clinical situations. eGFR's persistently <60 mL/min signify possible Chronic Kidney Disease.    Anion gap 7 5 - 15  Basic metabolic panel     Status: Abnormal   Collection Time: 11/08/14  5:30 AM  Result Value Ref Range   Sodium 141 135 - 145 mmol/L   Potassium 3.8 3.5 - 5.1 mmol/L   Chloride 109 101 - 111 mmol/L   CO2 27 22 - 32 mmol/L   Glucose, Bld 77 65 - 99 mg/dL   BUN <5 (L) 6 - 20 mg/dL   Creatinine, Ser 0.70 0.44 - 1.00 mg/dL   Calcium 8.3 (L) 8.9 - 10.3 mg/dL   GFR calc non Af Amer >60 >60 mL/min   GFR calc Af Amer >60 >60 mL/min    Comment: (NOTE) The eGFR has been calculated using the CKD EPI equation. This calculation has not been validated in all clinical situations. eGFR's persistently <60 mL/min signify possible Chronic Kidney Disease.    Anion gap 5 5 - 15  Hepatic function panel     Status: Abnormal   Collection Time: 11/08/14  5:30 AM  Result Value Ref Range   Total Protein 5.7 (L) 6.5 - 8.1 g/dL   Albumin 2.5 (L) 3.5 - 5.0 g/dL   AST 34 15 - 41 U/L   ALT 44 14 - 54 U/L   Alkaline Phosphatase 137 (H) 38 - 126 U/L   Total Bilirubin 0.7 0.3 - 1.2 mg/dL   Bilirubin, Direct 0.2 0.1 - 0.5 mg/dL   Indirect Bilirubin 0.5 0.3 - 0.9 mg/dL  CBC     Status: Abnormal   Collection Time: 11/08/14  5:35 AM  Result Value Ref Range   WBC 11.0 (H) 4.0 - 10.5 K/uL   RBC 4.36 3.87 - 5.11 MIL/uL   Hemoglobin 12.7 12.0 - 15.0 g/dL   HCT 37.7 36.0 - 46.0 %  MCV 86.5 78.0 - 100.0 fL   MCH 29.1 26.0 - 34.0 pg   MCHC 33.7 30.0 - 36.0 g/dL   RDW 15.3 11.5 - 15.5 %   Platelets 252 150 - 400 K/uL  Prepare RBC     Status: None   Collection Time: 11/08/14  8:15 AM  Result Value Ref Range   Order Confirmation ORDER PROCESSED BY BLOOD BANK   Type and screen     Status: None (Preliminary result)   Collection Time: 11/08/14  8:15 AM  Result Value Ref Range   ABO/RH(D) A POS    Antibody Screen NEG    Sample Expiration 11/11/2014    Unit Number [id number]    Blood Component Type RED CELLS,LR    Unit division 00    Status of Unit ALLOCATED    Transfusion Status OK TO TRANSFUSE    Crossmatch Result Compatible    Unit Number [id number]    Blood Component Type RED CELLS,LR    Unit division 00    Status of Unit ALLOCATED    Transfusion Status OK TO TRANSFUSE    Crossmatch Result Compatible     ABGS No results for input(s): PHART, PO2ART, TCO2, HCO3 in the last 72 hours.  Invalid input(s): PCO2 CULTURES Recent Results (from the past 240  hour(s))  Surgical pcr screen     Status: None   Collection Time: 11/03/14  9:00 AM  Result Value Ref Range Status   MRSA, PCR NEGATIVE NEGATIVE Final   Staphylococcus aureus NEGATIVE NEGATIVE Final    Comment:        The Xpert SA Assay (FDA approved for NASAL specimens in patients over 89 years of age), is one component of a comprehensive surveillance program.  Test performance has been validated by [facility] for patients greater than or equal to 54 year old. It is not intended to diagnose infection nor to guide or monitor treatment.    Studies/Results: No results found.  Medications: I have reviewed the patient's current medications.  Assesment:   Active Problems:   RUQ pain   Abdominal pain   Elevated LFTs   Hyperbilirubinemia   Choledocholithiasis with cholecystitis   Right upper quadrant pain   Calculus of bile duct with acute cholangitis with obstruction    Plan:  Medications reviewed Continue IV antibiotics Will monitorCBC/BMP/LFT As per surgery plan.     LOS: 5 days   Blane Worthington 11/08/2014, 11:52 AM

## 2014-11-08 NOTE — Progress Notes (Signed)
Patient seen today. Lost IV access last night and an order for a PICC line has been placed. Scheduled for choledocho enterostomy tomorrow. The risks and benefits of the procedure including bleeding, infection, anastomotic leak, and the possibility of cardiopulmonary difficulties were fully explained to the patient, who gave informed consent.

## 2014-11-09 ENCOUNTER — Inpatient Hospital Stay (HOSPITAL_COMMUNITY): Payer: Medicare Other | Admitting: Anesthesiology

## 2014-11-09 ENCOUNTER — Encounter (HOSPITAL_COMMUNITY): Payer: Self-pay | Admitting: *Deleted

## 2014-11-09 ENCOUNTER — Ambulatory Visit: Payer: Self-pay | Admitting: Neurology

## 2014-11-09 ENCOUNTER — Encounter (HOSPITAL_COMMUNITY)
Admission: EM | Disposition: A | Discharge status: Home Health | Payer: Self-pay | Source: Home / Self Care | Attending: General Surgery

## 2014-11-09 HISTORY — PX: CHOLEDOCHOENTEROSTOMY: SHX1343

## 2014-11-09 LAB — BASIC METABOLIC PANEL
Anion gap: 5 (ref 5–15)
CALCIUM: 8.1 mg/dL — AB (ref 8.9–10.3)
CO2: 27 mmol/L (ref 22–32)
Chloride: 109 mmol/L (ref 101–111)
Creatinine, Ser: 0.84 mg/dL (ref 0.44–1.00)
GFR calc Af Amer: >60 mL/min (ref >60)
GFR, EST NON AFRICAN AMERICAN: 59 mL/min — AB (ref >60)
GLUCOSE: 92 mg/dL (ref 65–99)
POTASSIUM: 4.1 mmol/L (ref 3.5–5.1)
Sodium: 141 mmol/L (ref 135–145)

## 2014-11-09 SURGERY — CHOLEDOCHOENTEROSTOMY
Anesthesia: General | Site: Abdomen

## 2014-11-09 MED ORDER — ONDANSETRON HCL 4 MG/2ML IJ SOLN: 4.0000 mg | Freq: Once | INTRAMUSCULAR | Status: DC | PRN

## 2014-11-09 MED ORDER — ROCURONIUM BROMIDE 50 MG/5ML IV SOLN
INTRAVENOUS | Status: AC
Start: 2014-11-09 — End: 2014-11-09
  Filled 2014-11-09: qty 2

## 2014-11-09 MED ORDER — MIDAZOLAM HCL 2 MG/2ML IJ SOLN
INTRAMUSCULAR | Status: AC
  Filled 2014-11-09: qty 2

## 2014-11-09 MED ORDER — ONDANSETRON HCL 4 MG/2ML IJ SOLN
INTRAMUSCULAR | Status: AC
Start: 2014-11-09 — End: 2014-11-09
  Filled 2014-11-09: qty 2

## 2014-11-09 MED ORDER — LACTATED RINGERS IV SOLN
INTRAVENOUS | Status: DC | PRN
  Administered 2014-11-09: 12:00:00 via INTRAVENOUS

## 2014-11-09 MED ORDER — FENTANYL CITRATE (PF) 100 MCG/2ML IJ SOLN
INTRAMUSCULAR | Status: DC | PRN
  Administered 2014-11-09: 25 ug via INTRAVENOUS
  Administered 2014-11-09: 50 ug via INTRAVENOUS
  Administered 2014-11-09 (×9): 25 ug via INTRAVENOUS

## 2014-11-09 MED ORDER — PANTOPRAZOLE SODIUM 40 MG IV SOLR
40.0000 mg | Freq: Every day | INTRAVENOUS | Status: DC
  Administered 2014-11-09 – 2014-11-12 (×4): 40 mg via INTRAVENOUS
  Filled 2014-11-09 (×4): qty 40

## 2014-11-09 MED ORDER — LIDOCAINE HCL (PF) 1 % IJ SOLN
INTRAMUSCULAR | Status: AC
  Filled 2014-11-09: qty 5

## 2014-11-09 MED ORDER — PHENYLEPHRINE HCL 10 MG/ML IJ SOLN
INTRAMUSCULAR | Status: DC | PRN
  Administered 2014-11-09: 40 ug via INTRAVENOUS

## 2014-11-09 MED ORDER — NEOSTIGMINE METHYLSULFATE 10 MG/10ML IV SOLN
INTRAVENOUS | Status: DC | PRN
  Administered 2014-11-09: 4 mg via INTRAVENOUS

## 2014-11-09 MED ORDER — LACTATED RINGERS IV SOLN
INTRAVENOUS | Status: DC
  Administered 2014-11-09: 1000 mL via INTRAVENOUS

## 2014-11-09 MED ORDER — GLYCOPYRROLATE 0.2 MG/ML IJ SOLN
INTRAMUSCULAR | Status: DC | PRN
  Administered 2014-11-09: .8 mg via INTRAVENOUS

## 2014-11-09 MED ORDER — ACETAMINOPHEN 650 MG RE SUPP: 650.0000 mg | Freq: Four times a day (QID) | RECTAL | Status: DC | PRN

## 2014-11-09 MED ORDER — CETYLPYRIDINIUM CHLORIDE 0.05 % MT LIQD: 7.0000 mL | Freq: Two times a day (BID) | OROMUCOSAL | Status: DC

## 2014-11-09 MED ORDER — ROCURONIUM BROMIDE 100 MG/10ML IV SOLN
INTRAVENOUS | Status: DC | PRN
  Administered 2014-11-09: 5 mg via INTRAVENOUS
  Administered 2014-11-09: 25 mg via INTRAVENOUS
  Administered 2014-11-09: 5 mg via INTRAVENOUS
  Administered 2014-11-09: 10 mg via INTRAVENOUS
  Administered 2014-11-09: 5 mg via INTRAVENOUS

## 2014-11-09 MED ORDER — POVIDONE-IODINE 10 % EX OINT
TOPICAL_OINTMENT | CUTANEOUS | Status: AC
  Filled 2014-11-09: qty 1

## 2014-11-09 MED ORDER — SUCCINYLCHOLINE CHLORIDE 20 MG/ML IJ SOLN
INTRAMUSCULAR | Status: AC
  Filled 2014-11-09: qty 1

## 2014-11-09 MED ORDER — FENTANYL CITRATE (PF) 100 MCG/2ML IJ SOLN
INTRAMUSCULAR | Status: AC
  Filled 2014-11-09: qty 4

## 2014-11-09 MED ORDER — MIDAZOLAM HCL 2 MG/2ML IJ SOLN
1.0000 mg | INTRAMUSCULAR | Status: DC | PRN
  Administered 2014-11-09 (×2): 1 mg via INTRAVENOUS

## 2014-11-09 MED ORDER — GLYCOPYRROLATE 0.2 MG/ML IJ SOLN
INTRAMUSCULAR | Status: AC
  Filled 2014-11-09: qty 4

## 2014-11-09 MED ORDER — CLONIDINE HCL 0.1 MG PO TABS: 0.1000 mg | ORAL_TABLET | Freq: Four times a day (QID) | ORAL | Status: DC | PRN

## 2014-11-09 MED ORDER — ETOMIDATE 2 MG/ML IV SOLN
INTRAVENOUS | Status: AC
Start: 2014-11-09 — End: 2014-11-09
  Filled 2014-11-09: qty 20

## 2014-11-09 MED ORDER — FENTANYL CITRATE (PF) 100 MCG/2ML IJ SOLN
25.0000 ug | INTRAMUSCULAR | Status: DC | PRN
  Administered 2014-11-09 (×2): 25 ug via INTRAVENOUS
  Filled 2014-11-09: qty 2

## 2014-11-09 MED ORDER — POVIDONE-IODINE 10 % OINT PACKET
TOPICAL_OINTMENT | CUTANEOUS | Status: DC | PRN
  Administered 2014-11-09: 1 via TOPICAL

## 2014-11-09 MED ORDER — ONDANSETRON HCL 4 MG/2ML IJ SOLN
4.0000 mg | Freq: Once | INTRAMUSCULAR | Status: AC
  Administered 2014-11-09: 4 mg via INTRAVENOUS

## 2014-11-09 MED ORDER — ETOMIDATE 2 MG/ML IV SOLN
INTRAVENOUS | Status: DC | PRN
  Administered 2014-11-09: 12 mg via INTRAVENOUS

## 2014-11-09 MED ORDER — MORPHINE SULFATE (PF) 2 MG/ML IV SOLN
2.0000 mg | INTRAVENOUS | Status: DC | PRN
  Administered 2014-11-09 – 2014-11-16 (×51): 2 mg via INTRAVENOUS
  Filled 2014-11-09 (×51): qty 1

## 2014-11-09 MED ORDER — ALBUTEROL SULFATE (2.5 MG/3ML) 0.083% IN NEBU: 2.5000 mg | INHALATION_SOLUTION | RESPIRATORY_TRACT | Status: DC | PRN

## 2014-11-09 MED ORDER — ENOXAPARIN SODIUM 40 MG/0.4ML ~~LOC~~ SOLN
40.0000 mg | SUBCUTANEOUS | Status: DC
  Administered 2014-11-10 – 2014-11-16 (×7): 40 mg via SUBCUTANEOUS
  Filled 2014-11-09 (×7): qty 0.4

## 2014-11-09 MED ORDER — LIDOCAINE HCL (CARDIAC) 10 MG/ML IV SOLN
INTRAVENOUS | Status: DC | PRN
  Administered 2014-11-09: 50 mg via INTRAVENOUS

## 2014-11-09 MED ORDER — ATROPINE SULFATE 1 MG/ML IJ SOLN
INTRAMUSCULAR | Status: AC
  Filled 2014-11-09: qty 1

## 2014-11-09 MED ORDER — MORPHINE SULFATE (PF) 2 MG/ML IV SOLN
1.0000 mg | INTRAVENOUS | Status: DC | PRN
  Administered 2014-11-09: 1 mg via INTRAVENOUS
  Filled 2014-11-09: qty 1

## 2014-11-09 MED ORDER — DEXAMETHASONE SODIUM PHOSPHATE 4 MG/ML IJ SOLN
4.0000 mg | Freq: Once | INTRAMUSCULAR | Status: AC
  Administered 2014-11-09: 4 mg via INTRAVENOUS

## 2014-11-09 MED ORDER — BUPIVACAINE LIPOSOME 1.3 % IJ SUSP
INTRAMUSCULAR | Status: DC | PRN
  Administered 2014-11-09: 20 mL

## 2014-11-09 MED ORDER — ACETAMINOPHEN 325 MG PO TABS
650.0000 mg | ORAL_TABLET | Freq: Four times a day (QID) | ORAL | Status: DC | PRN
  Administered 2014-11-12 – 2014-11-13 (×3): 650 mg via ORAL
  Filled 2014-11-09 (×3): qty 2

## 2014-11-09 MED ORDER — ONDANSETRON HCL 4 MG/2ML IJ SOLN
INTRAMUSCULAR | Status: AC
  Filled 2014-11-09: qty 2

## 2014-11-09 MED ORDER — NEOSTIGMINE METHYLSULFATE 10 MG/10ML IV SOLN
INTRAVENOUS | Status: AC
  Filled 2014-11-09: qty 1

## 2014-11-09 MED ORDER — 0.9 % SODIUM CHLORIDE (POUR BTL) OPTIME
TOPICAL | Status: DC | PRN
  Administered 2014-11-09 (×2): 1000 mL

## 2014-11-09 MED ORDER — NYSTATIN 100000 UNIT/GM EX CREA
TOPICAL_CREAM | Freq: Two times a day (BID) | CUTANEOUS | Status: DC
  Administered 2014-11-10 – 2014-11-13 (×5): via TOPICAL
  Administered 2014-11-13: 1 via TOPICAL
  Administered 2014-11-14 – 2014-11-16 (×4): via TOPICAL
  Filled 2014-11-09 (×2): qty 15

## 2014-11-09 MED ORDER — BUPIVACAINE LIPOSOME 1.3 % IJ SUSP
INTRAMUSCULAR | Status: AC
  Filled 2014-11-09: qty 20

## 2014-11-09 MED ORDER — DEXAMETHASONE SODIUM PHOSPHATE 4 MG/ML IJ SOLN
INTRAMUSCULAR | Status: AC
  Filled 2014-11-09: qty 1

## 2014-11-09 MED ORDER — LACTATED RINGERS IV SOLN
INTRAVENOUS | Status: DC
  Administered 2014-11-09: 20:00:00 via INTRAVENOUS

## 2014-11-09 SURGICAL SUPPLY — 44 items
18 GAUGE ANGIOCATH ×2 IMPLANT
BLADE SURG SZ11 CARB STEEL (BLADE) ×2 IMPLANT
CATH BALLN LAP (MISCELLANEOUS) ×2 IMPLANT
CLOTH BEACON ORANGE TIMEOUT ST (SAFETY) ×2 IMPLANT
COVER LIGHT HANDLE STERIS (MISCELLANEOUS) ×4 IMPLANT
DRAPE WARM FLUID 44X44 (DRAPE) ×2 IMPLANT
ELECT REM PT RETURN 9FT ADLT (ELECTROSURGICAL) ×3
ELECTRODE REM PT RTRN 9FT ADLT (ELECTROSURGICAL) IMPLANT
EVACUATOR DRAINAGE 10X20 100CC (DRAIN) IMPLANT
EVACUATOR SILICONE 100CC (DRAIN) ×3
GLOVE BIOGEL PI IND STRL 7.0 (GLOVE) IMPLANT
GLOVE BIOGEL PI INDICATOR 7.0 (GLOVE) ×4
GLOVE ECLIPSE 6.5 STRL STRAW (GLOVE) ×2 IMPLANT
GLOVE EXAM NITRILE MD LF STRL (GLOVE) ×2 IMPLANT
GLOVE SS BIOGEL STRL SZ 6.5 (GLOVE) IMPLANT
GLOVE SUPERSENSE BIOGEL SZ 6.5 (GLOVE) ×2
GLOVE SURG SS PI 7.5 STRL IVOR (GLOVE) ×2 IMPLANT
GOWN STRL REUS W/TWL LRG LVL3 (GOWN DISPOSABLE) ×6 IMPLANT
INST SET MAJOR GENERAL (KITS) ×2 IMPLANT
KIT ROOM TURNOVER APOR (KITS) ×2 IMPLANT
LIGASURE IMPACT 36 18CM CVD LR (INSTRUMENTS) ×2 IMPLANT
MANIFOLD NEPTUNE II (INSTRUMENTS) ×2 IMPLANT
NDL HYPO 18GX1.5 BLUNT FILL (NEEDLE) IMPLANT
NDL HYPO 21X1.5 SAFETY (NEEDLE) IMPLANT
NEEDLE HYPO 18GX1.5 BLUNT FILL (NEEDLE) ×3 IMPLANT
NEEDLE HYPO 21X1.5 SAFETY (NEEDLE) ×3 IMPLANT
NS IRRIG 1000ML POUR BTL (IV SOLUTION) ×2 IMPLANT
PACK ABDOMINAL MAJOR (CUSTOM PROCEDURE TRAY) ×2 IMPLANT
PAD ARMBOARD 7.5X6 YLW CONV (MISCELLANEOUS) ×2 IMPLANT
SET BASIN LINEN APH (SET/KITS/TRAYS/PACK) ×2 IMPLANT
SPONGE DRAIN TRACH 4X4 STRL 2S (GAUZE/BANDAGES/DRESSINGS) ×2 IMPLANT
SPONGE GAUZE 4X4 12PLY (GAUZE/BANDAGES/DRESSINGS) ×2 IMPLANT
SPONGE INTESTINAL PEANUT (DISPOSABLE) ×2 IMPLANT
SPONGE LAP 18X18 X RAY DECT (DISPOSABLE) IMPLANT
STAPLER VISISTAT (STAPLE) ×4 IMPLANT
SUT ETHILON 3 0 FSL (SUTURE) ×2 IMPLANT
SUT SILK 3 0 SH CR/8 (SUTURE) ×2 IMPLANT
SUT VIC AB 0 CT1 27 (SUTURE) ×12
SUT VIC AB 0 CT1 27XBRD ANTBC (SUTURE) IMPLANT
SUT VIC AB 0 CT1 27XCR 8 STRN (SUTURE) IMPLANT
SUT VIC AB 4-0 P-3 18XBRD (SUTURE) IMPLANT
SUT VIC AB 4-0 P3 18 (SUTURE) ×21
SYR 20CC LL (SYRINGE) ×2 IMPLANT
SYRINGE 10CC LL (SYRINGE) ×2 IMPLANT

## 2014-11-09 NOTE — Anesthesia Postprocedure Evaluation (Signed)
  Anesthesia Post-op Note  Patient: Lori Bishop  Procedure(s) Performed: Procedure(s): CHOLEDOCHODUODENOSTOMY (N/A)  Patient Location: PACU  Anesthesia Type:General  Level of Consciousness: awake and patient cooperative  Airway and Oxygen Therapy: Patient Spontanous Breathing and non-rebreather face mask  Post-op Pain: moderate  Post-op Assessment: Post-op Vital signs reviewed, Patient's Cardiovascular Status Stable, Respiratory Function Stable, Patent Airway and No signs of Nausea or vomiting              Post-op Vital Signs: Reviewed and stable  Last Vitals:  Filed Vitals:   11/09/14 1515  BP: 167/65  Pulse: 79  Temp:   Resp: 15    Complications: No apparent anesthesia complications

## 2014-11-09 NOTE — Progress Notes (Signed)
Subjective: Patient is resting. She is planned for surgery today.  Objective: Vital signs in last 24 hours: Temp:  [99.3 F (37.4 C)-99.8 F (37.7 C)] 99.3 F (37.4 C) (09/06 0631) Pulse Rate:  [76-87] 76 (09/06 0631) Resp:  [18-20] 20 (09/06 0631) BP: (125-140)/(46-77) 125/46 mmHg (09/06 0631) SpO2:  [89 %-91 %] 89 % (09/06 0631) Weight change:  Last BM Date: 11/07/14  Intake/Output from previous day: 09/05 0701 - 09/06 0700 In: 240 [P.O.:240] Out: 400 [Urine:400]  PHYSICAL EXAM General appearance: alert, fatigued and no distress Resp: clear to auscultation bilaterally Cardio: S1, S2 normal GI: soft and lax, bowel sound ++, RUQ tenderness Extremities: extremities normal, atraumatic, no cyanosis or edema  Lab Results:  Results for orders placed or performed during the hospital encounter of 11/01/14 (from the past 48 hour(s))  Basic metabolic panel     Status: Abnormal   Collection Time: 11/08/14  5:30 AM  Result Value Ref Range   Sodium 141 135 - 145 mmol/L   Potassium 3.8 3.5 - 5.1 mmol/L   Chloride 109 101 - 111 mmol/L   CO2 27 22 - 32 mmol/L   Glucose, Bld 77 65 - 99 mg/dL   BUN <5 (L) 6 - 20 mg/dL   Creatinine, Ser 0.70 0.44 - 1.00 mg/dL   Calcium 8.3 (L) 8.9 - 10.3 mg/dL   GFR calc non Af Amer >60 >60 mL/min   GFR calc Af Amer >60 >60 mL/min    Comment: (NOTE) The eGFR has been calculated using the CKD EPI equation. This calculation has not been validated in all clinical situations. eGFR's persistently <60 mL/min signify possible Chronic Kidney Disease.    Anion gap 5 5 - 15  Hepatic function panel     Status: Abnormal   Collection Time: 11/08/14  5:30 AM  Result Value Ref Range   Total Protein 5.7 (L) 6.5 - 8.1 g/dL   Albumin 2.5 (L) 3.5 - 5.0 g/dL   AST 34 15 - 41 U/L   ALT 44 14 - 54 U/L   Alkaline Phosphatase 137 (H) 38 - 126 U/L   Total Bilirubin 0.7 0.3 - 1.2 mg/dL   Bilirubin, Direct 0.2 0.1 - 0.5 mg/dL   Indirect Bilirubin 0.5 0.3 - 0.9 mg/dL   CBC     Status: Abnormal   Collection Time: 11/08/14  5:35 AM  Result Value Ref Range   WBC 11.0 (H) 4.0 - 10.5 K/uL   RBC 4.36 3.87 - 5.11 MIL/uL   Hemoglobin 12.7 12.0 - 15.0 g/dL   HCT 37.7 36.0 - 46.0 %   MCV 86.5 78.0 - 100.0 fL   MCH 29.1 26.0 - 34.0 pg   MCHC 33.7 30.0 - 36.0 g/dL   RDW 15.3 11.5 - 15.5 %   Platelets 252 150 - 400 K/uL  Prepare RBC     Status: None   Collection Time: 11/08/14  8:15 AM  Result Value Ref Range   Order Confirmation ORDER PROCESSED BY BLOOD BANK   Type and screen     Status: None (Preliminary result)   Collection Time: 11/08/14  8:15 AM  Result Value Ref Range   ABO/RH(D) A POS    Antibody Screen NEG    Sample Expiration 11/11/2014    Unit Number [id number]    Blood Component Type RED CELLS,LR    Unit division 00    Status of Unit ALLOCATED    Transfusion Status OK TO TRANSFUSE    Crossmatch Result Compatible  Unit Number [id number]    Blood Component Type RED CELLS,LR    Unit division 00    Status of Unit ALLOCATED    Transfusion Status OK TO TRANSFUSE    Crossmatch Result Compatible   Basic metabolic panel     Status: Abnormal   Collection Time: 11/09/14  5:42 AM  Result Value Ref Range   Sodium 141 135 - 145 mmol/L   Potassium 4.1 3.5 - 5.1 mmol/L   Chloride 109 101 - 111 mmol/L   CO2 27 22 - 32 mmol/L   Glucose, Bld 92 65 - 99 mg/dL   BUN <5 (L) 6 - 20 mg/dL   Creatinine, Ser 0.84 0.44 - 1.00 mg/dL   Calcium 8.1 (L) 8.9 - 10.3 mg/dL   GFR calc non Af Amer 59 (L) >60 mL/min   GFR calc Af Amer >60 >60 mL/min    Comment: (NOTE) The eGFR has been calculated using the CKD EPI equation. This calculation has not been validated in all clinical situations. eGFR's persistently <60 mL/min signify possible Chronic Kidney Disease.    Anion gap 5 5 - 15    ABGS No results for input(s): PHART, PO2ART, TCO2, HCO3 in the last 72 hours.  Invalid input(s): PCO2 CULTURES Recent Results (from the past 240 hour(s))  Surgical  pcr screen     Status: None   Collection Time: 11/03/14  9:00 AM  Result Value Ref Range Status   MRSA, PCR NEGATIVE NEGATIVE Final   Staphylococcus aureus NEGATIVE NEGATIVE Final    Comment:        The Xpert SA Assay (FDA approved for NASAL specimens in patients over 28 years of age), is one component of a comprehensive surveillance program.  Test performance has been validated by [facility] for patients greater than or equal to 2 year old. It is not intended to diagnose infection nor to guide or monitor treatment.    Studies/Results: No results found.  Medications: I have reviewed the patient's current medications.  Assesment:   Active Problems:   RUQ pain   Abdominal pain   Elevated LFTs   Hyperbilirubinemia   Choledocholithiasis with cholecystitis   Right upper quadrant pain   Calculus of bile duct with acute cholangitis with obstruction    Plan:  Medications reviewed Continue IV antibiotics As per surgery plan.     LOS: 6 days   Lori Bishop 11/09/2014, 8:16 AM

## 2014-11-09 NOTE — Anesthesia Preprocedure Evaluation (Signed)
Anesthesia Evaluation  Patient identified by MRN, date of birth, ID band Patient awake    Reviewed: Allergy & Precautions, NPO status , Patient's Chart, lab work & pertinent test results  Airway Mallampati: II       Dental  (+) Edentulous Lower, Edentulous Upper   Pulmonary COPDformer smoker,  breath sounds clear to auscultation        Cardiovascular hypertension, Pt. on medications Rhythm:Regular Rate:Normal     Neuro/Psych  Neuromuscular disease    GI/Hepatic   Endo/Other    Renal/GU      Musculoskeletal  (+) Arthritis - (spinal stenosis, chronic LBP - fentanyl patch),   Abdominal   Peds  Hematology   Anesthesia Other Findings   Reproductive/Obstetrics                             Anesthesia Physical Anesthesia Plan  ASA: III  Anesthesia Plan: General   Post-op Pain Management:    Induction: Intravenous  Airway Management Planned: Oral ETT  Additional Equipment:   Intra-op Plan:   Post-operative Plan: Extubation in OR  Informed Consent: I have reviewed the patients History and Physical, chart, labs and discussed the procedure including the risks, benefits and alternatives for the proposed anesthesia with the patient or authorized representative who has indicated his/her understanding and acceptance.     Plan Discussed with:   Anesthesia Plan Comments: (amidate induction )        Anesthesia Quick Evaluation                                  Anesthesia Evaluation  Patient identified by MRN, date of birth, ID band Patient awake    Reviewed: Allergy & Precautions, H&P , NPO status , Patient's Chart, lab work & pertinent test results  History of Anesthesia Complications Negative for: history of anesthetic complications  Airway Mallampati: II TM Distance: >3 FB Neck ROM: Full    Dental  (+) Edentulous Upper and Edentulous Lower   Pulmonary former smoker,     Pulmonary exam normal       Cardiovascular Exercise Tolerance: Poor hypertension, Pt. on medications Rate:Normal     Neuro/Psych Carpal tunnel numbness right hand; post plating right femur  Neuromuscular disease    GI/Hepatic Enzymes elevated   Endo/Other  Gout   Renal/GU      Musculoskeletal  (+) Arthritis -, Osteoarthritis,    Abdominal Normal abdominal exam  (+)   Peds  Hematology   Anesthesia Other Findings   Reproductive/Obstetrics                           Anesthesia Physical Anesthesia Plan  ASA: II  Anesthesia Plan: General   Post-op Pain Management:    Induction: Intravenous  Airway Management Planned: Oral ETT  Additional Equipment:   Intra-op Plan:   Post-operative Plan: Extubation in OR  Informed Consent: I have reviewed the patients History and Physical, chart, labs and discussed the procedure including the risks, benefits and alternatives for the proposed anesthesia with the patient or authorized representative who has indicated his/her understanding and acceptance.     Plan Discussed with: Anesthesiologist  Anesthesia Plan Comments:         Anesthesia Quick Evaluation

## 2014-11-09 NOTE — Op Note (Signed)
Patient:  Lori Bishop  DOB:  1920/12/11  MRN:  [id number]   Preop Diagnosis:  Recurrent choledocholithiasis  Postop Diagnosis:  Same  Procedure:  Choledochoduodenostomy  Surgeon:  Franky Macho, M.D.  Anes:  Gen. endotracheal  Indications:  Patient is a 81 year old black female status post cholecystectomy in the remote past who has had recurrent episodes of choledocholithiasis. She has undergone multiple ERCPs and now is referred for a choledocho enterostomy. The risks and benefits of the procedure including bleeding, infection, cardiopulmonary difficulties, and the possibility of leakage were fully explained to the patient, who gave informed consent.  Procedure note:  The patient was placed in the supine position. After induction of general endotracheal anesthesia, the abdomen was prepped and draped using the usual sterile technique with DuraPrep. Surgical site confirmation was performed.  A right subcostal incision was made down to the peritoneum. The peritoneal cavity was entered into without too much difficulty. The was adhesions towards the subhepatic space. This was freed away sharply without difficulty. The common bile duct was identified. In addition, the duodenum was fully identified. A longitudinal choledochotomy was then performed. The common bile duct was explored both superiorly and inferiorly. No stones were seen. The system was flushed with saline. A longitudinal incision was made along the duodenum. Both incisions were almost 2 cm in length. A 2 layer choledocho duodenostomy was then performed using 4-0 Vicryl interrupted sutures to reapproximate the mucosa and 3-0 silk sutures to attack the outer anastomotic layer. The right upper quadrant was then copiously irrigated with normal saline. A #10 flat Jackson-Pratt drain was placed into this region and brought out through a separate stab wound medial to the incision line. It was secured at the skin level using 3-0 nylon  interrupted suture. The posterior fascia was reapproximated using a running 0 Vicryl suture. The anterior abdominal wall was reapproximated using interrupted 0 Vicryl sutures. The subcutaneous layer was irrigated normal saline. Exparel was instilled into the surrounding wound. The skin was closed using staples. Betadine ointment and dry sterile dressings were applied.  All tape and needle counts were correct at the end of the procedure. The patient was extubated in the operating room and transferred to PACU in stable condition.  Complications:  None  EBL:  25 mL  Specimen:  None  Drains: JP drain to subhepatic space

## 2014-11-09 NOTE — Anesthesia Procedure Notes (Addendum)
Date/Time: 11/09/2014 11:55 AM Performed by: Franco Nones Oxygen Delivery Method: Non-rebreather mask   Procedure Name: Intubation Date/Time: 11/09/2014 12:10 PM Performed by: Franco Nones Pre-anesthesia Checklist: Patient identified, Patient being monitored, Timeout performed, Emergency Drugs available and Suction available Patient Re-evaluated:Patient Re-evaluated prior to inductionOxygen Delivery Method: Circle System Utilized Preoxygenation: Pre-oxygenation with 100% oxygen Intubation Type: IV induction Ventilation: Mask ventilation without difficulty Laryngoscope Size: Miller and 2 Grade View: Grade I Tube type: Oral Tube size: 7.0 mm Number of attempts: 1 Airway Equipment and Method: Stylet and Oral airway Placement Confirmation: ETT inserted through vocal cords under direct vision,  positive ETCO2 and breath sounds checked- equal and bilateral Secured at: 21 cm Tube secured with: Tape Dental Injury: Teeth and Oropharynx as per pre-operative assessment

## 2014-11-09 NOTE — Transfer of Care (Signed)
Immediate Anesthesia Transfer of Care Note  Patient: Lori Bishop  Procedure(s) Performed: Procedure(s): CHOLEDOCHODUODENOSTOMY (N/A)  Patient Location: PACU  Anesthesia Type:General  Level of Consciousness: sedated and patient cooperative  Airway & Oxygen Therapy: Patient Spontanous Breathing and non-rebreather face mask  Post-op Assessment: Report given to RN, Post -op Vital signs reviewed and stable and Patient moving all extremities  Post vital signs: Reviewed and stable  Last Vitals:  Filed Vitals:   11/09/14 1145  BP: 159/80  Pulse:   Temp:   Resp: 23    Complications: No apparent anesthesia complications

## 2014-11-09 NOTE — Care Management Note (Signed)
Case Management Note  Patient Details  Name: Lori Bishop MRN: [id number] Date of Birth: September 13, 1920   Expected Discharge Date:                  Expected Discharge Plan:  Home/Self Care  In-House Referral:  NA  Discharge planning Services  CM Consult  Post Acute Care Choice:  NA Choice offered to:  NA  DME Arranged:    DME Agency:     HH Arranged:    HH Agency:     Status of Service:  Completed, signed off  Medicare Important Message Given:  Yes-second notification given Date Medicare IM Given:    Medicare IM give by:    Date Additional Medicare IM Given:    Additional Medicare Important Message give by:     If discussed at Long Length of Stay Meetings, dates discussed: 11/09/2014    Additional Comments: Surgery today. Pt will need PT eval prior to DC. Will cont to follow.  Malcolm Metro, RN 11/09/2014, 3:11 PM

## 2014-11-10 ENCOUNTER — Encounter (HOSPITAL_COMMUNITY): Payer: Self-pay | Admitting: General Surgery

## 2014-11-10 LAB — HEPATIC FUNCTION PANEL
ALT: 33 U/L (ref 14–54)
AST: 29 U/L (ref 15–41)
Albumin: 2.4 g/dL — ABNORMAL LOW (ref 3.5–5.0)
Alkaline Phosphatase: 108 U/L (ref 38–126)
BILIRUBIN DIRECT: 0.3 mg/dL (ref 0.1–0.5)
BILIRUBIN INDIRECT: 0.7 mg/dL (ref 0.3–0.9)
TOTAL PROTEIN: 5.9 g/dL — AB (ref 6.5–8.1)
Total Bilirubin: 1 mg/dL (ref 0.3–1.2)

## 2014-11-10 LAB — TYPE AND SCREEN
ABO/RH(D): A POS
ANTIBODY SCREEN: NEGATIVE
UNIT DIVISION: 0
UNIT DIVISION: 0

## 2014-11-10 LAB — BASIC METABOLIC PANEL
Anion gap: 7 (ref 5–15)
BUN: 6 mg/dL (ref 6–20)
CALCIUM: 8.1 mg/dL — AB (ref 8.9–10.3)
CO2: 26 mmol/L (ref 22–32)
CREATININE: 0.77 mg/dL (ref 0.44–1.00)
Chloride: 108 mmol/L (ref 101–111)
GFR calc Af Amer: >60 mL/min (ref >60)
GLUCOSE: 103 mg/dL — AB (ref 65–99)
POTASSIUM: 4 mmol/L (ref 3.5–5.1)
SODIUM: 141 mmol/L (ref 135–145)

## 2014-11-10 LAB — CBC
HCT: 36.8 % (ref 36.0–46.0)
Hemoglobin: 12.7 g/dL (ref 12.0–15.0)
MCH: 29.4 pg (ref 26.0–34.0)
MCHC: 34.5 g/dL (ref 30.0–36.0)
MCV: 85.2 fL (ref 78.0–100.0)
PLATELETS: 322 10*3/uL (ref 150–400)
RBC: 4.32 MIL/uL (ref 3.87–5.11)
RDW: 15.4 % (ref 11.5–15.5)
WBC: 18.9 10*3/uL — ABNORMAL HIGH (ref 4.0–10.5)

## 2014-11-10 LAB — MAGNESIUM: MAGNESIUM: 2 mg/dL (ref 1.7–2.4)

## 2014-11-10 LAB — PHOSPHORUS: Phosphorus: 3.3 mg/dL (ref 2.5–4.6)

## 2014-11-10 MED ORDER — KCL IN DEXTROSE-NACL 20-5-0.45 MEQ/L-%-% IV SOLN
INTRAVENOUS | Status: DC
  Administered 2014-11-10 – 2014-11-12 (×3): via INTRAVENOUS

## 2014-11-10 NOTE — Progress Notes (Signed)
Subjective: Patient is in ICU post-surgically. She is doing better and she is table. She has NG-tube for now. Complains of some soreness at the incision site.  Objective: Vital signs in last 24 hours: Temp:  [98.2 F (36.8 C)-99.9 F (37.7 C)] 99 F (37.2 C) (09/07 0800) Pulse Rate:  [66-102] 87 (09/07 1200) Resp:  [9-21] 21 (09/07 1200) BP: (123-172)/(53-107) 146/65 mmHg (09/07 1200) SpO2:  [93 %-100 %] 100 % (09/07 1200) Weight:  [70.4 kg (155 lb 3.3 oz)] 70.4 kg (155 lb 3.3 oz) (09/07 0500) Weight change:  Last BM Date: 11/08/14  Intake/Output from previous day: 09/06 0701 - 09/07 0700 In: 1307.8 [I.V.:1207.8; IV Piggyback:100] Out: 2175 [Urine:2025; Drains:125; Blood:25]  PHYSICAL EXAM General appearance: alert, fatigued and no distress Resp: clear to auscultation bilaterally Cardio: S1, S2 normal GI: incision site dressed, JP drain in place bowel sound ++ Extremities: extremities normal, atraumatic, no cyanosis or edema  Lab Results:  Results for orders placed or performed during the hospital encounter of 11/01/14 (from the past 48 hour(s))  Basic metabolic panel     Status: Abnormal   Collection Time: 11/09/14  5:42 AM  Result Value Ref Range   Sodium 141 135 - 145 mmol/L   Potassium 4.1 3.5 - 5.1 mmol/L   Chloride 109 101 - 111 mmol/L   CO2 27 22 - 32 mmol/L   Glucose, Bld 92 65 - 99 mg/dL   BUN <5 (L) 6 - 20 mg/dL   Creatinine, Ser 0.84 0.44 - 1.00 mg/dL   Calcium 8.1 (L) 8.9 - 10.3 mg/dL   GFR calc non Af Amer 59 (L) >60 mL/min   GFR calc Af Amer >60 >60 mL/min    Comment: (NOTE) The eGFR has been calculated using the CKD EPI equation. This calculation has not been validated in all clinical situations. eGFR's persistently <60 mL/min signify possible Chronic Kidney Disease.    Anion gap 5 5 - 15  Basic metabolic panel     Status: Abnormal   Collection Time: 11/10/14  7:16 AM  Result Value Ref Range   Sodium 141 135 - 145 mmol/L   Potassium 4.0 3.5 - 5.1  mmol/L   Chloride 108 101 - 111 mmol/L   CO2 26 22 - 32 mmol/L   Glucose, Bld 103 (H) 65 - 99 mg/dL   BUN 6 6 - 20 mg/dL   Creatinine, Ser 0.77 0.44 - 1.00 mg/dL   Calcium 8.1 (L) 8.9 - 10.3 mg/dL   GFR calc non Af Amer >60 >60 mL/min   GFR calc Af Amer >60 >60 mL/min    Comment: (NOTE) The eGFR has been calculated using the CKD EPI equation. This calculation has not been validated in all clinical situations. eGFR's persistently <60 mL/min signify possible Chronic Kidney Disease.    Anion gap 7 5 - 15  Magnesium     Status: None   Collection Time: 11/10/14  7:16 AM  Result Value Ref Range   Magnesium 2.0 1.7 - 2.4 mg/dL  Phosphorus     Status: None   Collection Time: 11/10/14  7:16 AM  Result Value Ref Range   Phosphorus 3.3 2.5 - 4.6 mg/dL  CBC     Status: Abnormal   Collection Time: 11/10/14  7:16 AM  Result Value Ref Range   WBC 18.9 (H) 4.0 - 10.5 K/uL   RBC 4.32 3.87 - 5.11 MIL/uL   Hemoglobin 12.7 12.0 - 15.0 g/dL   HCT 36.8 36.0 - 46.0 %  MCV 85.2 78.0 - 100.0 fL   MCH 29.4 26.0 - 34.0 pg   MCHC 34.5 30.0 - 36.0 g/dL   RDW 15.4 11.5 - 15.5 %   Platelets 322 150 - 400 K/uL  Hepatic function panel     Status: Abnormal   Collection Time: 11/10/14  7:16 AM  Result Value Ref Range   Total Protein 5.9 (L) 6.5 - 8.1 g/dL   Albumin 2.4 (L) 3.5 - 5.0 g/dL   AST 29 15 - 41 U/L   ALT 33 14 - 54 U/L   Alkaline Phosphatase 108 38 - 126 U/L   Total Bilirubin 1.0 0.3 - 1.2 mg/dL   Bilirubin, Direct 0.3 0.1 - 0.5 mg/dL   Indirect Bilirubin 0.7 0.3 - 0.9 mg/dL    ABGS No results for input(s): PHART, PO2ART, TCO2, HCO3 in the last 72 hours.  Invalid input(s): PCO2 CULTURES Recent Results (from the past 240 hour(s))  Surgical pcr screen     Status: None   Collection Time: 11/03/14  9:00 AM  Result Value Ref Range Status   MRSA, PCR NEGATIVE NEGATIVE Final   Staphylococcus aureus NEGATIVE NEGATIVE Final    Comment:        The Xpert SA Assay (FDA approved for NASAL  specimens in patients over 68 years of age), is one component of a comprehensive surveillance program.  Test performance has been validated by [facility] for patients greater than or equal to 71 year old. It is not intended to diagnose infection nor to guide or monitor treatment.    Studies/Results: No results found.  Medications: I have reviewed the patient's current medications.  Assesment:   Active Problems:   RUQ pain   Abdominal pain   Elevated LFTs   Hyperbilirubinemia   Choledocholithiasis with cholecystitis   Right upper quadrant pain   Calculus of bile duct with acute cholangitis with obstruction Post choledochoduodenostomy  Plan:  Medications reviewed Continue hydration As per surgery plan.     LOS: 7 days   Lynee Rosenbach 11/10/2014, 12:29 PM

## 2014-11-10 NOTE — Progress Notes (Signed)
1 Day Post-Op  Subjective: Patient having mild incisional pain. No shortness of breath noted.  Objective: Vital signs in last 24 hours: Temp:  [98.2 F (36.8 C)-99.9 F (37.7 C)] 99 F (37.2 C) (09/07 0800) Pulse Rate:  [66-102] 85 (09/07 0830) Resp:  [9-23] 15 (09/07 0830) BP: (123-172)/(53-107) 139/67 mmHg (09/07 0830) SpO2:  [92 %-100 %] 98 % (09/07 0830) Weight:  [70.4 kg (155 lb 3.3 oz)] 70.4 kg (155 lb 3.3 oz) (09/07 0500) Last BM Date: 11/08/14  Intake/Output from previous day: 09/06 0701 - 09/07 0700 In: 1307.8 [I.V.:1207.8; IV Piggyback:100] Out: 2175 [Urine:2025; Drains:125; Blood:25] Intake/Output this shift:    General appearance: alert, cooperative, appears stated age and no distress Resp: clear to auscultation bilaterally Cardio: Regular rate and rhythm with slight systolic ejection murmur appreciated. GI: Soft, dressing dry and intact. JP drainage with serosanguineous drainage noted.  Lab Results:   Recent Labs  11/08/14 0535 11/10/14 0716  WBC 11.0* 18.9*  HGB 12.7 12.7  HCT 37.7 36.8  PLT 252 322   BMET  Recent Labs  11/09/14 0542 11/10/14 0716  NA 141 141  K 4.1 4.0  CL 109 108  CO2 27 26  GLUCOSE 92 103*  BUN <5* 6  CREATININE 0.84 0.77  CALCIUM 8.1* 8.1*   PT/INR No results for input(s): LABPROT, INR in the last 72 hours.  Studies/Results: No results found.  Anti-infectives: Anti-infectives    Start     Dose/Rate Route Frequency Ordered Stop   11/02/14 1200  Ampicillin-Sulbactam (UNASYN) 3 g in sodium chloride 0.9 % 100 mL IVPB     3 g 100 mL/hr over 60 Minutes Intravenous Every 6 hours 11/02/14 1009        Assessment/Plan: s/p Procedure(s): CHOLEDOCHODUODENOSTOMY Impression: Stable on postoperative day 1. Will continue ICU monitoring. Adjust IV fluids. We'll get patient up to chair.  LOS: 7 days    Jaleiyah Alas A 11/10/2014

## 2014-11-11 LAB — BASIC METABOLIC PANEL
Anion gap: 7 (ref 5–15)
BUN: 5 mg/dL — AB (ref 6–20)
CALCIUM: 8.1 mg/dL — AB (ref 8.9–10.3)
CO2: 27 mmol/L (ref 22–32)
CREATININE: 0.66 mg/dL (ref 0.44–1.00)
Chloride: 105 mmol/L (ref 101–111)
GFR calc Af Amer: >60 mL/min (ref >60)
GFR calc non Af Amer: >60 mL/min (ref >60)
GLUCOSE: 104 mg/dL — AB (ref 65–99)
Potassium: 3.7 mmol/L (ref 3.5–5.1)
Sodium: 139 mmol/L (ref 135–145)

## 2014-11-11 LAB — CBC
HCT: 37 % (ref 36.0–46.0)
Hemoglobin: 12.4 g/dL (ref 12.0–15.0)
MCH: 28.9 pg (ref 26.0–34.0)
MCHC: 33.5 g/dL (ref 30.0–36.0)
MCV: 86.2 fL (ref 78.0–100.0)
PLATELETS: 364 10*3/uL (ref 150–400)
RBC: 4.29 MIL/uL (ref 3.87–5.11)
RDW: 15.4 % (ref 11.5–15.5)
WBC: 17.7 10*3/uL — ABNORMAL HIGH (ref 4.0–10.5)

## 2014-11-11 LAB — MAGNESIUM: MAGNESIUM: 2.1 mg/dL (ref 1.7–2.4)

## 2014-11-11 LAB — PHOSPHORUS: Phosphorus: 2.6 mg/dL (ref 2.5–4.6)

## 2014-11-11 MED ORDER — MENTHOL 3 MG MT LOZG: 1.0000 | LOZENGE | OROMUCOSAL | Status: DC | PRN

## 2014-11-11 NOTE — Progress Notes (Signed)
Report called to [facility] on dept 300.  Verbalized understanding.  Pt transferred to 310 in safe and stable condition.

## 2014-11-11 NOTE — Progress Notes (Signed)
PT Cancellation Note  Patient Details Name: Lori Bishop MRN: [id number] DOB: Jun 12, 1920   Cancelled Treatment:    Reason Eval/Treat Not Completed: Pain limiting ability to participate.  RN is aware and will give pain meds.   [name] L 11/11/2014, 1:43 PM

## 2014-11-11 NOTE — Progress Notes (Signed)
2 Days Post-Op  Subjective: Patient complains of NG tube. Otherwise feeling okay.  Objective: Vital signs in last 24 hours: Temp:  [98.9 F (37.2 C)-99.1 F (37.3 C)] 99 F (37.2 C) (09/08 0822) Pulse Rate:  [69-87] 81 (09/08 0700) Resp:  [9-21] 17 (09/08 0700) BP: (122-154)/(57-83) 141/59 mmHg (09/08 0700) SpO2:  [92 %-100 %] 94 % (09/08 0700) Weight:  [71.5 kg (157 lb 10.1 oz)] 71.5 kg (157 lb 10.1 oz) (09/08 0500) Last BM Date: 11/08/14  Intake/Output from previous day: 09/07 0701 - 09/08 0700 In: 660 [I.V.:260; IV Piggyback:400] Out: 2020 [Urine:2000; Drains:20] Intake/Output this shift:    General appearance: alert, cooperative, appears stated age and no distress Resp: clear to auscultation bilaterally Cardio: regular rate and rhythm, S1, S2 normal, no murmur, click, rub or gallop GI: Soft. Dressing dry and intact. JP drainage serosanguineous in nature.  Lab Results:   Recent Labs  11/10/14 0716 11/11/14 0440  WBC 18.9* 17.7*  HGB 12.7 12.4  HCT 36.8 37.0  PLT 322 364   BMET  Recent Labs  11/10/14 0716 11/11/14 0440  NA 141 139  K 4.0 3.7  CL 108 105  CO2 26 27  GLUCOSE 103* 104*  BUN 6 5*  CREATININE 0.77 0.66  CALCIUM 8.1* 8.1*   PT/INR No results for input(s): LABPROT, INR in the last 72 hours.  Studies/Results: No results found.  Anti-infectives: Anti-infectives    Start     Dose/Rate Route Frequency Ordered Stop   11/02/14 1200  Ampicillin-Sulbactam (UNASYN) 3 g in sodium chloride 0.9 % 100 mL IVPB     3 g 100 mL/hr over 60 Minutes Intravenous Every 6 hours 11/02/14 1009        Assessment/Plan: s/p Procedure(s): CHOLEDOCHODUODENOSTOMY Impression: Continues to recover well from surgery. Leukocytosis slowly resolving. Hemoglobin stable. Oxygenation stable.  Plan: Continue NG tube decompression. Will transfer to regular floor.  LOS: 8 days    Lori Bishop 11/11/2014

## 2014-11-11 NOTE — Progress Notes (Signed)
Was able to gain iv access. All medications administered.

## 2014-11-11 NOTE — Progress Notes (Signed)
Notified Dr. Karilyn Cota, pt crying in pain. Requesting pain medication. Awaiting response.

## 2014-11-11 NOTE — Progress Notes (Signed)
Subjective: Patient is resting. She has NG tube in place with minimum out put. No complaint.  Objective: Vital signs in last 24 hours: Temp:  [98.9 F (37.2 C)-99.1 F (37.3 C)] 99 F (37.2 C) (09/08 0400) Pulse Rate:  [69-87] 81 (09/08 0700) Resp:  [9-21] 17 (09/08 0700) BP: (122-154)/(57-83) 141/59 mmHg (09/08 0700) SpO2:  [92 %-100 %] 94 % (09/08 0700) Weight:  [71.5 kg (157 lb 10.1 oz)] 71.5 kg (157 lb 10.1 oz) (09/08 0500) Weight change: 1.1 kg (2 lb 6.8 oz) Last BM Date: 11/08/14  Intake/Output from previous day: 09/07 0701 - 09/08 0700 In: 660 [I.V.:260; IV Piggyback:400] Out: 2020 [Urine:2000; Drains:20]  PHYSICAL EXAM General appearance: alert, fatigued and no distress Resp: clear to auscultation bilaterally Cardio: S1, S2 normal GI: incision site dressed, JP drain in place bowel sound ++ Extremities: extremities normal, atraumatic, no cyanosis or edema  Lab Results:  Results for orders placed or performed during the hospital encounter of 11/01/14 (from the past 48 hour(s))  Basic metabolic panel     Status: Abnormal   Collection Time: 11/10/14  7:16 AM  Result Value Ref Range   Sodium 141 135 - 145 mmol/L   Potassium 4.0 3.5 - 5.1 mmol/L   Chloride 108 101 - 111 mmol/L   CO2 26 22 - 32 mmol/L   Glucose, Bld 103 (H) 65 - 99 mg/dL   BUN 6 6 - 20 mg/dL   Creatinine, Ser 0.77 0.44 - 1.00 mg/dL   Calcium 8.1 (L) 8.9 - 10.3 mg/dL   GFR calc non Af Amer >60 >60 mL/min   GFR calc Af Amer >60 >60 mL/min    Comment: (NOTE) The eGFR has been calculated using the CKD EPI equation. This calculation has not been validated in all clinical situations. eGFR's persistently <60 mL/min signify possible Chronic Kidney Disease.    Anion gap 7 5 - 15  Magnesium     Status: None   Collection Time: 11/10/14  7:16 AM  Result Value Ref Range   Magnesium 2.0 1.7 - 2.4 mg/dL  Phosphorus     Status: None   Collection Time: 11/10/14  7:16 AM  Result Value Ref Range   Phosphorus  3.3 2.5 - 4.6 mg/dL  CBC     Status: Abnormal   Collection Time: 11/10/14  7:16 AM  Result Value Ref Range   WBC 18.9 (H) 4.0 - 10.5 K/uL   RBC 4.32 3.87 - 5.11 MIL/uL   Hemoglobin 12.7 12.0 - 15.0 g/dL   HCT 36.8 36.0 - 46.0 %   MCV 85.2 78.0 - 100.0 fL   MCH 29.4 26.0 - 34.0 pg   MCHC 34.5 30.0 - 36.0 g/dL   RDW 15.4 11.5 - 15.5 %   Platelets 322 150 - 400 K/uL  Hepatic function panel     Status: Abnormal   Collection Time: 11/10/14  7:16 AM  Result Value Ref Range   Total Protein 5.9 (L) 6.5 - 8.1 g/dL   Albumin 2.4 (L) 3.5 - 5.0 g/dL   AST 29 15 - 41 U/L   ALT 33 14 - 54 U/L   Alkaline Phosphatase 108 38 - 126 U/L   Total Bilirubin 1.0 0.3 - 1.2 mg/dL   Bilirubin, Direct 0.3 0.1 - 0.5 mg/dL   Indirect Bilirubin 0.7 0.3 - 0.9 mg/dL  Basic metabolic panel     Status: Abnormal   Collection Time: 11/11/14  4:40 AM  Result Value Ref Range   Sodium 139  135 - 145 mmol/L   Potassium 3.7 3.5 - 5.1 mmol/L   Chloride 105 101 - 111 mmol/L   CO2 27 22 - 32 mmol/L   Glucose, Bld 104 (H) 65 - 99 mg/dL   BUN 5 (L) 6 - 20 mg/dL   Creatinine, Ser 0.66 0.44 - 1.00 mg/dL   Calcium 8.1 (L) 8.9 - 10.3 mg/dL   GFR calc non Af Amer >60 >60 mL/min   GFR calc Af Amer >60 >60 mL/min    Comment: (NOTE) The eGFR has been calculated using the CKD EPI equation. This calculation has not been validated in all clinical situations. eGFR's persistently <60 mL/min signify possible Chronic Kidney Disease.    Anion gap 7 5 - 15  CBC     Status: Abnormal   Collection Time: 11/11/14  4:40 AM  Result Value Ref Range   WBC 17.7 (H) 4.0 - 10.5 K/uL   RBC 4.29 3.87 - 5.11 MIL/uL   Hemoglobin 12.4 12.0 - 15.0 g/dL   HCT 37.0 36.0 - 46.0 %   MCV 86.2 78.0 - 100.0 fL   MCH 28.9 26.0 - 34.0 pg   MCHC 33.5 30.0 - 36.0 g/dL   RDW 15.4 11.5 - 15.5 %   Platelets 364 150 - 400 K/uL  Magnesium     Status: None   Collection Time: 11/11/14  4:40 AM  Result Value Ref Range   Magnesium 2.1 1.7 - 2.4 mg/dL   Phosphorus     Status: None   Collection Time: 11/11/14  4:40 AM  Result Value Ref Range   Phosphorus 2.6 2.5 - 4.6 mg/dL    ABGS No results for input(s): PHART, PO2ART, TCO2, HCO3 in the last 72 hours.  Invalid input(s): PCO2 CULTURES Recent Results (from the past 240 hour(s))  Surgical pcr screen     Status: None   Collection Time: 11/03/14  9:00 AM  Result Value Ref Range Status   MRSA, PCR NEGATIVE NEGATIVE Final   Staphylococcus aureus NEGATIVE NEGATIVE Final    Comment:        The Xpert SA Assay (FDA approved for NASAL specimens in patients over 39 years of age), is one component of a comprehensive surveillance program.  Test performance has been validated by [facility] for patients greater than or equal to 39 year old. It is not intended to diagnose infection nor to guide or monitor treatment.    Studies/Results: No results found.  Medications: I have reviewed the patient's current medications.  Assesment:   Active Problems:   RUQ pain   Abdominal pain   Elevated LFTs   Hyperbilirubinemia   Choledocholithiasis with cholecystitis   Right upper quadrant pain   Calculus of bile duct with acute cholangitis with obstruction Post choledochoduodenostomy  Plan:  Medications reviewed Continue hydration As per surgery plan.     LOS: 8 days   Beckam Abdulaziz 11/11/2014, 8:21 AM

## 2014-11-11 NOTE — Progress Notes (Signed)
Upon shift, AC and first shift RN notified nurse that pt had lost iv access. After multiple attempts, AC and nurse unable to get iv access. Notified Dr. Karilyn Cota who stated a PICC line would be an option. Notified surgeon Dr. Lovell Sheehan. Dr. Lovell Sheehan stated to hold all hydration fluids and all medications. He states pt can have ice chips. Pt verbalized understanding but expressed displeasure in not being able to receive her anti-nausea and pain medications. MD aware. Will continue to monitor pt and provide alternative comfort measures.

## 2014-11-12 ENCOUNTER — Inpatient Hospital Stay (HOSPITAL_COMMUNITY): Payer: Medicare Other

## 2014-11-12 LAB — CBC
HEMATOCRIT: 34.9 % — AB (ref 36.0–46.0)
Hemoglobin: 12 g/dL (ref 12.0–15.0)
MCH: 29.7 pg (ref 26.0–34.0)
MCHC: 34.4 g/dL (ref 30.0–36.0)
MCV: 86.4 fL (ref 78.0–100.0)
Platelets: 382 10*3/uL (ref 150–400)
RBC: 4.04 MIL/uL (ref 3.87–5.11)
RDW: 15.1 % (ref 11.5–15.5)
WBC: 15.7 10*3/uL — AB (ref 4.0–10.5)

## 2014-11-12 LAB — BASIC METABOLIC PANEL
ANION GAP: 6 (ref 5–15)
BUN: 6 mg/dL (ref 6–20)
CHLORIDE: 106 mmol/L (ref 101–111)
CO2: 28 mmol/L (ref 22–32)
Calcium: 8.1 mg/dL — ABNORMAL LOW (ref 8.9–10.3)
Creatinine, Ser: 0.66 mg/dL (ref 0.44–1.00)
GFR calc Af Amer: >60 mL/min (ref >60)
GLUCOSE: 88 mg/dL (ref 65–99)
POTASSIUM: 3.6 mmol/L (ref 3.5–5.1)
Sodium: 140 mmol/L (ref 135–145)

## 2014-11-12 MED ORDER — SODIUM CHLORIDE 0.9 % IJ SOLN
10.0000 mL | Freq: Two times a day (BID) | INTRAMUSCULAR | Status: DC
  Administered 2014-11-12 – 2014-11-16 (×7): 10 mL

## 2014-11-12 MED ORDER — SODIUM CHLORIDE 0.9 % IJ SOLN: 10.0000 mL | INTRAMUSCULAR | Status: DC | PRN

## 2014-11-12 NOTE — Care Management Important Message (Signed)
Important Message  Patient Details  Name: RABIA ARGOTE MRN: [id number] Date of Birth: [birth year]   Medicare Important Message Given:  Yes-third notification given    Malcolm Metro, RN 11/12/2014, 2:00 PM

## 2014-11-12 NOTE — Progress Notes (Signed)
Subjective: Patient is resting. She has NG tube in place. No new complaint. No nausea or vomiting..  Objective: Vital signs in last 24 hours: Temp:  [97.8 F (36.6 C)-99.4 F (37.4 C)] 99.4 F (37.4 C) (09/09 0502) Pulse Rate:  [76-86] 85 (09/09 0502) Resp:  [9-20] 18 (09/09 0502) BP: (133-148)/(57-66) 143/61 mmHg (09/09 0502) SpO2:  [92 %-96 %] 93 % (09/09 0502) Weight change:  Last BM Date: 11/08/14  Intake/Output from previous day: 09/08 0701 - 09/09 0700 In: 2061.7 [I.V.:1661.7; IV Piggyback:400] Out: 1325 [Urine:1300; Drains:25]  PHYSICAL EXAM General appearance: alert, fatigued and no distress Resp: clear to auscultation bilaterally Cardio: S1, S2 normal GI: incision site dressed, JP drain in place bowel sound ++ Extremities: extremities normal, atraumatic, no cyanosis or edema  Lab Results:  Results for orders placed or performed during the hospital encounter of 11/01/14 (from the past 48 hour(s))  Basic metabolic panel     Status: Abnormal   Collection Time: 11/11/14  4:40 AM  Result Value Ref Range   Sodium 139 135 - 145 mmol/L   Potassium 3.7 3.5 - 5.1 mmol/L   Chloride 105 101 - 111 mmol/L   CO2 27 22 - 32 mmol/L   Glucose, Bld 104 (H) 65 - 99 mg/dL   BUN 5 (L) 6 - 20 mg/dL   Creatinine, Ser 0.66 0.44 - 1.00 mg/dL   Calcium 8.1 (L) 8.9 - 10.3 mg/dL   GFR calc non Af Amer >60 >60 mL/min   GFR calc Af Amer >60 >60 mL/min    Comment: (NOTE) The eGFR has been calculated using the CKD EPI equation. This calculation has not been validated in all clinical situations. eGFR's persistently <60 mL/min signify possible Chronic Kidney Disease.    Anion gap 7 5 - 15  CBC     Status: Abnormal   Collection Time: 11/11/14  4:40 AM  Result Value Ref Range   WBC 17.7 (H) 4.0 - 10.5 K/uL   RBC 4.29 3.87 - 5.11 MIL/uL   Hemoglobin 12.4 12.0 - 15.0 g/dL   HCT 37.0 36.0 - 46.0 %   MCV 86.2 78.0 - 100.0 fL   MCH 28.9 26.0 - 34.0 pg   MCHC 33.5 30.0 - 36.0 g/dL   RDW  15.4 11.5 - 15.5 %   Platelets 364 150 - 400 K/uL  Magnesium     Status: None   Collection Time: 11/11/14  4:40 AM  Result Value Ref Range   Magnesium 2.1 1.7 - 2.4 mg/dL  Phosphorus     Status: None   Collection Time: 11/11/14  4:40 AM  Result Value Ref Range   Phosphorus 2.6 2.5 - 4.6 mg/dL  CBC     Status: Abnormal   Collection Time: 11/12/14  5:41 AM  Result Value Ref Range   WBC 15.7 (H) 4.0 - 10.5 K/uL   RBC 4.04 3.87 - 5.11 MIL/uL   Hemoglobin 12.0 12.0 - 15.0 g/dL   HCT 34.9 (L) 36.0 - 46.0 %   MCV 86.4 78.0 - 100.0 fL   MCH 29.7 26.0 - 34.0 pg   MCHC 34.4 30.0 - 36.0 g/dL   RDW 15.1 11.5 - 15.5 %   Platelets 382 150 - 400 K/uL  Basic metabolic panel     Status: Abnormal   Collection Time: 11/12/14  5:41 AM  Result Value Ref Range   Sodium 140 135 - 145 mmol/L   Potassium 3.6 3.5 - 5.1 mmol/L   Chloride 106 101 -  111 mmol/L   CO2 28 22 - 32 mmol/L   Glucose, Bld 88 65 - 99 mg/dL   BUN 6 6 - 20 mg/dL   Creatinine, Ser 0.66 0.44 - 1.00 mg/dL   Calcium 8.1 (L) 8.9 - 10.3 mg/dL   GFR calc non Af Amer >60 >60 mL/min   GFR calc Af Amer >60 >60 mL/min    Comment: (NOTE) The eGFR has been calculated using the CKD EPI equation. This calculation has not been validated in all clinical situations. eGFR's persistently <60 mL/min signify possible Chronic Kidney Disease.    Anion gap 6 5 - 15    ABGS No results for input(s): PHART, PO2ART, TCO2, HCO3 in the last 72 hours.  Invalid input(s): PCO2 CULTURES Recent Results (from the past 240 hour(s))  Surgical pcr screen     Status: None   Collection Time: 11/03/14  9:00 AM  Result Value Ref Range Status   MRSA, PCR NEGATIVE NEGATIVE Final   Staphylococcus aureus NEGATIVE NEGATIVE Final    Comment:        The Xpert SA Assay (FDA approved for NASAL specimens in patients over 49 years of age), is one component of a comprehensive surveillance program.  Test performance has been validated by [facility] for patients  greater than or equal to 103 year old. It is not intended to diagnose infection nor to guide or monitor treatment.    Studies/Results: No results found.  Medications: I have reviewed the patient's current medications.  Assesment:   Active Problems:   RUQ pain   Abdominal pain   Elevated LFTs   Hyperbilirubinemia   Choledocholithiasis with cholecystitis   Right upper quadrant pain   Calculus of bile duct with acute cholangitis with obstruction Post choledochoduodenostomy  Plan:  Medications reviewed Continue hydration As per surgery plan.     LOS: 9 days   Lori Bishop 11/12/2014, 8:07 AM

## 2014-11-12 NOTE — Care Management (Signed)
Patient has mobility limitations that impair their ability to do one or more motility-related activities of daily living in customary locations in the home. Patient cannot use crutches, cane, or walker to resole the issue sufficiently, patient can safely self propel the wheelchair in the home or has a caregiver that can assist.

## 2014-11-12 NOTE — Progress Notes (Signed)
Peripherally Inserted Central Catheter/Midline Placement  The IV Nurse has discussed with the patient and/or persons authorized to consent for the patient, the purpose of this procedure and the potential benefits and risks involved with this procedure.  The benefits include less needle sticks, lab draws from the catheter and patient may be discharged home with the catheter.  Risks include, but not limited to, infection, bleeding, blood clot (thrombus formation), and puncture of an artery; nerve damage and irregular heat beat.  Alternatives to this procedure were also discussed.  PICC/Midline Placement Documentation  PICC / Midline Single Lumen 11/12/14 PICC Left Basilic 41 cm 0 cm (Active)  Indication for Insertion or Continuance of Line Limited venous access - need for IV therapy >5 days (PICC only);Poor Vasculature-patient has had multiple peripheral attempts or PIVs lasting less than 24 hours 11/12/2014 12:30 PM  Exposed Catheter (cm) 0 cm 11/12/2014 12:30 PM  Site Assessment Clean;Dry;Intact 11/12/2014 12:30 PM  Line Status Flushed;Saline locked;Capped (central line);Blood return noted 11/12/2014 12:30 PM  Dressing Type Transparent 11/12/2014 12:30 PM  Dressing Status Clean;Dry;Intact 11/12/2014 12:30 PM       Marinda Elk 11/12/2014, 12:39 PM

## 2014-11-12 NOTE — Plan of Care (Signed)
Problem: Phase I Progression Outcomes Goal: Pain controlled with appropriate interventions Outcome: Not Progressing Pt continues to rate her pain at a 10 even after the administration of pain medication. Goal: OOB as tolerated unless otherwise ordered Outcome: Not Progressing Pt has refused to get out of bed. She states she will get up tomorrow. Goal: Incision/dressings dry and intact Outcome: Progressing Dressing is clean, dry, and intact. Still original surgical dressing.  Goal: Tubes/drains patent Outcome: Progressing JP Drain emptied and recharged.  Goal: Voiding-avoid urinary catheter unless indicated Outcome: Progressing Pt still has a foley in place for perioperative reasons.

## 2014-11-12 NOTE — Care Management Note (Signed)
Case Management Note  Patient Details  Name: Lori Bishop MRN: [id number] Date of Birth: 1920-04-03  Expected Discharge Date:    11/14/2014              Expected Discharge Plan:  Home w Home Health Services  In-House Referral:  NA  Discharge planning Services  CM Consult  Post Acute Care Choice:  NA, Durable Medical Equipment Choice offered to:  NA  DME Arranged:  Wheelchair manual DME Agency:  [facility]  HH Arranged:  RN, PT, Nurse's Aide HH Agency:  [facility]  Status of Service:  Completed, signed off  Medicare Important Message Given:  Yes-third notification given Date Medicare IM Given:    Medicare IM give by:    Date Additional Medicare IM Given:    Additional Medicare Important Message give by:     If discussed at Long Length of Stay Meetings, dates discussed:    Additional Comments: Pt orignially agreed to SNF but is now refusing. Pt requests [facility] for [facility] services. Alroy Bailiff, of [facility] made aware of referral and will obtain pt info from chart. DC over weekend is possible. If pt discharges RN will notify [facility] that pt has left. Pt will need WC at DC. Pt has chosen [facility] for DME needs and Alroy Bailiff made aware to obtain pt info from chart. Wheelchair will be delivered to home after DC. DC plan discussed with, pt's niece and caregiver and she is in agreement. No further CM needs identified.  Malcolm Metro, RN 11/12/2014, 3:27 PM

## 2014-11-12 NOTE — Clinical Social Work Note (Signed)
CSW followed up with pt. She states, "I am going home, you can forget rehab." CSW notified CM for home health needs and will sign off.  Derenda Fennel, LCSW [phone]

## 2014-11-12 NOTE — Clinical Social Work Note (Signed)
Clinical Social Work Assessment  Patient Details  Name: Lori Bishop MRN: [id number] Date of Birth: January 02, 1921  Date of referral:  11/12/14               Reason for consult:  Facility Placement                Permission sought to share information with:  Family Supports Permission granted to share information::  Yes, Verbal Permission Granted  Name::     Marketing executive::     Relationship::  niece  Contact Information:     Housing/Transportation Living arrangements for the past 2 months:  Single Family Home Source of Information:  Patient, Other (Comment Required) (niece) Patient Interpreter Needed:  None Criminal Activity/Legal Involvement Pertinent to Current Situation/Hospitalization:  No - Comment as needed Significant Relationships:  Other Family Members Lives with:  Self Do you feel safe going back to the place where you live?  Yes Need for family participation in patient care:  Yes (Comment)  Care giving concerns:  Pt lives alone and is not at baseline.    Social Worker assessment / plan:  CSW met with pt at bedside. Pt alert and oriented and reports she lives alone. She has been in hospital for 9 days and is 3 days post op. PT evaluated pt and recommend SNF. CSW shared this with pt and she mentioned some bad experiences in the past, but wants to defer to her niece, Melanee Left. CSW spoke with [place] while pt having PICC line placed. She appeared somewhat overwhelmed by recommendation as she states her aunt has been very clear that she never wanted to go to SNF. Pt's husband had been to several prior to his death and they had issues at each one. Pt was an Therapist, sports for 30 years and Melanee Left feels that she will want to return home with home health if that is presented to her. At baseline, pt ambulates with a walker and is fairly independent. She has a Automotive engineer chair for nighttime. A neighbor checks on pt 3 times a day and also provides meals and transport for pt. Melanee Left is concerned about how pt  would emotionally handle being at SNF and wants to talk to her about it privately before making a decision. If pt returns home, [place] plans to talk to neighbor about the two of them arranging around the clock supervision until pt improves.   Employment status:  Retired Nurse, adult PT Recommendations:  [place] / Referral to community resources:  [place]  Patient/Family's Response to care:  Pt states that she really does not want to consider SNF, but will if absolutely necessary.   Patient/Family's Understanding of and Emotional Response to Diagnosis, Current Treatment, and Prognosis:  Pt and niece aware of treatment plan and recommendations. Niece is very concerned about pt's emotional response to being at [facility].   Emotional Assessment Appearance:  Appears stated age Attitude/Demeanor/Rapport:  Other (Cooperative) Affect (typically observed):  Accepting Orientation:  Oriented to Self, Oriented to Place, Oriented to  Time, Oriented to Situation Alcohol / Substance use:  Not Applicable Psych involvement (Current and /or in the community):  No (Comment)  Discharge Needs  Concerns to be addressed:  Discharge Planning Concerns Readmission within the last 30 days:  No Current discharge risk:  Lives alone, Physical Impairment Barriers to Discharge:  Continued Medical Work up   Salome Arnt, [place] 11/12/2014, 11:44 AM [phone]

## 2014-11-12 NOTE — Progress Notes (Signed)
3 Days Post-Op  Subjective: Patient continues to progress well. She does have mild incisional pain. IV access has been difficult in this patient.  Objective: Vital signs in last 24 hours: Temp:  [97.8 F (36.6 C)-99.4 F (37.4 C)] 99.4 F (37.4 C) (09/09 0502) Pulse Rate:  [76-86] 85 (09/09 0502) Resp:  [9-20] 18 (09/09 0502) BP: (133-148)/(57-61) 143/61 mmHg (09/09 0502) SpO2:  [92 %-96 %] 93 % (09/09 0502) Last BM Date: 11/08/14  Intake/Output from previous day: 09/08 0701 - 09/09 0700 In: 2061.7 [I.V.:1661.7; IV Piggyback:400] Out: 1325 [Urine:1300; Drains:25] Intake/Output this shift:    General appearance: alert, cooperative and no distress Resp: clear to auscultation bilaterally Cardio: regular rate and rhythm, S1, S2 normal, no murmur, click, rub or gallop GI: Soft, dressing dry and intact. JP drainage serosanguineous and low in nature. Minimal output from NG tube.  Lab Results:   Recent Labs  11/11/14 0440 11/12/14 0541  WBC 17.7* 15.7*  HGB 12.4 12.0  HCT 37.0 34.9*  PLT 364 382   BMET  Recent Labs  11/11/14 0440 11/12/14 0541  NA 139 140  K 3.7 3.6  CL 105 106  CO2 27 28  GLUCOSE 104* 88  BUN 5* 6  CREATININE 0.66 0.66  CALCIUM 8.1* 8.1*   PT/INR No results for input(s): LABPROT, INR in the last 72 hours.  Studies/Results: No results found.  Anti-infectives: Anti-infectives    Start     Dose/Rate Route Frequency Ordered Stop   11/02/14 1200  Ampicillin-Sulbactam (UNASYN) 3 g in sodium chloride 0.9 % 100 mL IVPB     3 g 100 mL/hr over 60 Minutes Intravenous Every 6 hours 11/02/14 1009        Assessment/Plan: s/p Procedure(s): CHOLEDOCHODUODENOSTOMY Impression: Postoperative day 3. Patient continues to heal well. Will DC gastric tube advance to clear liquid diet. Will get PICC line inserted as IV access will be needed for ongoing IV antibiotics and pain control.  LOS: 9 days    Arryana Tolleson A 11/12/2014

## 2014-11-12 NOTE — Evaluation (Signed)
Physical Therapy Evaluation Patient Details Name: Lori Bishop MRN: [id number] DOB: 1920/05/19 Today's Date: 11/12/2014   History of Present Illness  81 year old female who  has a past medical history of Gout; Osteoarthritis; Leg cramps; Choledocholithiasis (3 2014 and 5 2014); and Hypertension.  She had a choledochoduodenostomy on 11-09-14 due o gall sones.  Pt normally lives alone with assist from a neighbor and family.  She ambulates with a walker.  She now has an abdominal incision in the right abdomen with a J drain in place.    Clinical Impression   Pt was seen for evaluation.  She was alert and oriented, c/o 9/10 abdominal pain.  She reported having had no pain meds this AM.  She was willing to work with me, however, and was found to have significant generalized weakness and deconditioning.  We were able to get her to a chair but she is currently unable to ambulate.  She never actually c/o pain with any mobility or gave any evidence that she had pain with grimacing, etc.  I am recommending SNF at d/c.  If she refusing to go to SNF, she will need full time assist at home.    Follow Up Recommendations SNF    Equipment Recommendations  None recommended by PT    Recommendations for Other Services   OT    Precautions / Restrictions Precautions Precautions: Fall Restrictions Weight Bearing Restrictions: No      Mobility  Bed Mobility Overal bed mobility: Needs Assistance Bed Mobility: Supine to Sit     Supine to sit: HOB elevated;Mod assist     General bed mobility comments: transfer is painful  Transfers Overall transfer level: Needs assistance Equipment used: Rolling walker (2 wheeled) Transfers: Sit to/from [employer] Transfers Sit to Stand: Max assist Stand pivot transfers: Min assist       General transfer comment: pt able to transfer bed to chair using a wallker  Ambulation/Gait             General Gait Details: unable to ambulate                         Balance Overall balance assessment: No apparent balance deficits (not formally assessed)                                           Pertinent Vitals/Pain Pain Assessment: 0-10 Pain Score: 9  Pain Location: right abdomen in region of incision Pain Descriptors / Indicators: Aching Pain Intervention(s): Limited activity within patient's tolerance;Monitored during session;Repositioned;Patient requesting pain meds-RN notified    Home Living Family/patient expects to be discharged to:: Skilled nursing facility                      Prior Function Level of Independence: Needs assistance   Gait / Transfers Assistance Needed: transfers and ambulates independently with a walker  ADL's / Homemaking Assistance Needed: assist with all ADLs                Extremity/Trunk Assessment   Upper Extremity Assessment: Generalized weakness           Lower Extremity Assessment: Generalized weakness      Cervical / Trunk Assessment: Kyphotic  Communication   Communication: No difficulties  Cognition Arousal/Alertness: Awake/alert Behavior During Therapy: WFL for tasks assessed/performed Overall Cognitive Status: Within  Functional Limits for tasks assessed                                    Assessment/Plan    PT Assessment Patient needs continued PT services  PT Diagnosis Difficulty walking;Generalized weakness;Acute pain   PT Problem List Decreased strength;Decreased activity tolerance;Decreased mobility;Pain  PT Treatment Interventions Gait training;Functional mobility training;Therapeutic exercise   PT Goals (Current goals can be found in the Care Plan section) Acute Rehab PT Goals Patient Stated Goal: wants to go home PT Goal Formulation: With patient Time For Goal Achievement: 11/26/14 Potential to Achieve Goals: Good    Frequency Min 3X/week   Barriers to discharge Decreased caregiver support lives alone                    End of Session Equipment Utilized During Treatment: Gait belt;Oxygen Activity Tolerance: Patient tolerated treatment well;Patient limited by pain Patient left: in chair;with call bell/phone within reach;with chair alarm set Nurse Communication: Mobility status         Time: 9528-4132 PT Time Calculation (min) (ACUTE ONLY): 32 min   Charges:   PT Evaluation $Initial PT Evaluation Tier I: 1 Procedure     PT G CodesSilena, Wyss L  PT 11/12/2014, 11:21 AM [phone]

## 2014-11-12 NOTE — Progress Notes (Signed)
Pt.'s IV inadvertently pulled out while assisting physical therapy. Pt. Will have PICC line inserted as previously discussed.

## 2014-11-13 LAB — CBC
HCT: 33.5 % — ABNORMAL LOW (ref 36.0–46.0)
HEMOGLOBIN: 11.4 g/dL — AB (ref 12.0–15.0)
MCH: 29.5 pg (ref 26.0–34.0)
MCHC: 34 g/dL (ref 30.0–36.0)
MCV: 86.8 fL (ref 78.0–100.0)
Platelets: 341 10*3/uL (ref 150–400)
RBC: 3.86 MIL/uL — AB (ref 3.87–5.11)
RDW: 15.2 % (ref 11.5–15.5)
WBC: 12.4 10*3/uL — ABNORMAL HIGH (ref 4.0–10.5)

## 2014-11-13 MED ORDER — HYDROCODONE-ACETAMINOPHEN 5-325 MG PO TABS
1.0000 | ORAL_TABLET | ORAL | Status: DC | PRN
  Administered 2014-11-13 – 2014-11-16 (×13): 1 via ORAL
  Filled 2014-11-13 (×13): qty 1

## 2014-11-13 MED ORDER — PANTOPRAZOLE SODIUM 40 MG PO TBEC
40.0000 mg | DELAYED_RELEASE_TABLET | Freq: Every day | ORAL | Status: DC
  Administered 2014-11-13 – 2014-11-15 (×3): 40 mg via ORAL
  Filled 2014-11-13 (×3): qty 1

## 2014-11-13 MED ORDER — BISACODYL 10 MG RE SUPP
10.0000 mg | Freq: Two times a day (BID) | RECTAL | Status: DC
  Administered 2014-11-13 – 2014-11-15 (×4): 10 mg via RECTAL
  Filled 2014-11-13 (×4): qty 1

## 2014-11-13 NOTE — Progress Notes (Signed)
4 Days Post-Op  Subjective: Feels okay. No nausea or vomiting noted.  Objective: Vital signs in last 24 hours: Temp:  [98.5 F (36.9 C)-98.7 F (37.1 C)] 98.5 F (36.9 C) (09/10 0626) Pulse Rate:  [72-95] 72 (09/10 0626) Resp:  [18-20] 20 (09/10 0626) BP: (130-136)/(58-60) 136/59 mmHg (09/10 0626) SpO2:  [93 %-94 %] 93 % (09/10 0626) Last BM Date: 11/08/14  Intake/Output from previous day: 09/09 0701 - 09/10 0700 In: 1300 [P.O.:740; I.V.:560] Out: 890 [Urine:850; Drains:40] Intake/Output this shift:    General appearance: alert, cooperative and no distress Resp: clear to auscultation bilaterally Cardio: regular rate and rhythm, S1, S2 normal, no murmur, click, rub or gallop GI: Soft, dressing dry and intact. JP drainage without bilious drainage. Minimal bowel sounds appreciated.  Lab Results:   Recent Labs  11/12/14 0541 11/13/14 0635  WBC 15.7* 12.4*  HGB 12.0 11.4*  HCT 34.9* 33.5*  PLT 382 341   BMET  Recent Labs  11/11/14 0440 11/12/14 0541  NA 139 140  K 3.7 3.6  CL 105 106  CO2 27 28  GLUCOSE 104* 88  BUN 5* 6  CREATININE 0.66 0.66  CALCIUM 8.1* 8.1*   PT/INR No results for input(s): LABPROT, INR in the last 72 hours.  Studies/Results: Dg Chest Port 1 View  11/12/2014   CLINICAL DATA:  Central catheter insertion  EXAM: PORTABLE CHEST - 1 VIEW  COMPARISON:  November 05, 2014  FINDINGS: Central catheter tip is in the superior vena cava just beyond the junction with the left innominate vein. No pneumothorax. There is slight bibasilar interstitial edema. There is no airspace consolidation. Heart is mildly enlarged. There is mild pulmonary venous hypertension. No adenopathy. No bone lesions. There is degenerative change in the left shoulder.  IMPRESSION: Central catheter tip in superior vena cava just beyond the junction with the left innominate vein. No pneumothorax. Mild edema with cardiomegaly and mild pulmonary venous hypertension. These findings  indicate a degree of congestive heart failure. No airspace consolidation.   Electronically Signed   By: Bretta Bang III M.D.   On: 11/12/2014 13:02   Dg Chest Port 1v Same Day  11/12/2014   CLINICAL DATA:  Recheck PICC line position  EXAM: PORTABLE CHEST - 1 VIEW SAME DAY  COMPARISON:  11/12/2014  FINDINGS: Left PICC line projects over the superior vena cava about 2 cm above the cavoatrial junction similar to prior study. No pneumothorax.  Stable cardiac enlargement and bilateral interstitial prominence. Stable right lower lobe infiltrate.  IMPRESSION: No significant change in PICC line position allowing for differences in patient rotation. The tip is minimally more deeply located than it was previously remaining about 2 cm above the cavoatrial junction .   Electronically Signed   By: Esperanza Heir M.D.   On: 11/12/2014 14:48    Anti-infectives: Anti-infectives    Start     Dose/Rate Route Frequency Ordered Stop   11/02/14 1200  Ampicillin-Sulbactam (UNASYN) 3 g in sodium chloride 0.9 % 100 mL IVPB     3 g 100 mL/hr over 60 Minutes Intravenous Every 6 hours 11/02/14 1009        Assessment/Plan: s/p Procedure(s): CHOLEDOCHODUODENOSTOMY Impression: Continues to recover well, postoperative day 4. No evidence of anastomotic leak. We'll advance to full liquid diet. Will start suppositories to facilitate bowel function return. Will switch to by mouth pain medication.  LOS: 10 days    Lori Bishop A 11/13/2014

## 2014-11-13 NOTE — Progress Notes (Signed)
Subjective: Patient feels better. She continued to improve. Her NG tube is removed and started on clear liquid diet..  Objective: Vital signs in last 24 hours: Temp:  [98.5 F (36.9 C)-98.7 F (37.1 C)] 98.5 F (36.9 C) (09/10 0626) Pulse Rate:  [72-95] 72 (09/10 0626) Resp:  [18-20] 20 (09/10 0626) BP: (130-136)/(58-60) 136/59 mmHg (09/10 0626) SpO2:  [93 %-94 %] 93 % (09/10 0626) Weight change:  Last BM Date: 11/08/14  Intake/Output from previous day: 09/09 0701 - 09/10 0700 In: 1300 [P.O.:740; I.V.:560] Out: 53 [Urine:850; Drains:40]  PHYSICAL EXAM General appearance: alert, fatigued and no distress Resp: clear to auscultation bilaterally Cardio: S1, S2 normal GI: incision site dressed, JP drain in place bowel sound ++ Extremities: extremities normal, atraumatic, no cyanosis or edema  Lab Results:  Results for orders placed or performed during the hospital encounter of 11/01/14 (from the past 48 hour(s))  CBC     Status: Abnormal   Collection Time: 11/12/14  5:41 AM  Result Value Ref Range   WBC 15.7 (H) 4.0 - 10.5 K/uL   RBC 4.04 3.87 - 5.11 MIL/uL   Hemoglobin 12.0 12.0 - 15.0 g/dL   HCT 34.9 (L) 36.0 - 46.0 %   MCV 86.4 78.0 - 100.0 fL   MCH 29.7 26.0 - 34.0 pg   MCHC 34.4 30.0 - 36.0 g/dL   RDW 15.1 11.5 - 15.5 %   Platelets 382 150 - 400 K/uL  Basic metabolic panel     Status: Abnormal   Collection Time: 11/12/14  5:41 AM  Result Value Ref Range   Sodium 140 135 - 145 mmol/L   Potassium 3.6 3.5 - 5.1 mmol/L   Chloride 106 101 - 111 mmol/L   CO2 28 22 - 32 mmol/L   Glucose, Bld 88 65 - 99 mg/dL   BUN 6 6 - 20 mg/dL   Creatinine, Ser 0.66 0.44 - 1.00 mg/dL   Calcium 8.1 (L) 8.9 - 10.3 mg/dL   GFR calc non Af Amer >60 >60 mL/min   GFR calc Af Amer >60 >60 mL/min    Comment: (NOTE) The eGFR has been calculated using the CKD EPI equation. This calculation has not been validated in all clinical situations. eGFR's persistently <60 mL/min signify possible  Chronic Kidney Disease.    Anion gap 6 5 - 15  CBC     Status: Abnormal   Collection Time: 11/13/14  6:35 AM  Result Value Ref Range   WBC 12.4 (H) 4.0 - 10.5 K/uL   RBC 3.86 (L) 3.87 - 5.11 MIL/uL   Hemoglobin 11.4 (L) 12.0 - 15.0 g/dL   HCT 33.5 (L) 36.0 - 46.0 %   MCV 86.8 78.0 - 100.0 fL   MCH 29.5 26.0 - 34.0 pg   MCHC 34.0 30.0 - 36.0 g/dL   RDW 15.2 11.5 - 15.5 %   Platelets 341 150 - 400 K/uL    ABGS No results for input(s): PHART, PO2ART, TCO2, HCO3 in the last 72 hours.  Invalid input(s): PCO2 CULTURES No results found for this or any previous visit (from the past 240 hour(s)). Studies/Results: Dg Chest Port 1 View  11/12/2014   CLINICAL DATA:  Central catheter insertion  EXAM: PORTABLE CHEST - 1 VIEW  COMPARISON:  November 05, 2014  FINDINGS: Central catheter tip is in the superior vena cava just beyond the junction with the left innominate vein. No pneumothorax. There is slight bibasilar interstitial edema. There is no airspace consolidation. Heart is mildly  enlarged. There is mild pulmonary venous hypertension. No adenopathy. No bone lesions. There is degenerative change in the left shoulder.  IMPRESSION: Central catheter tip in superior vena cava just beyond the junction with the left innominate vein. No pneumothorax. Mild edema with cardiomegaly and mild pulmonary venous hypertension. These findings indicate a degree of congestive heart failure. No airspace consolidation.   Electronically Signed   By: Lowella Grip III M.D.   On: 11/12/2014 13:02   Dg Chest Port 1v Same Day  11/12/2014   CLINICAL DATA:  Recheck PICC line position  EXAM: PORTABLE CHEST - 1 VIEW SAME DAY  COMPARISON:  11/12/2014  FINDINGS: Left PICC line projects over the superior vena cava about 2 cm above the cavoatrial junction similar to prior study. No pneumothorax.  Stable cardiac enlargement and bilateral interstitial prominence. Stable right lower lobe infiltrate.  IMPRESSION: No significant change  in PICC line position allowing for differences in patient rotation. The tip is minimally more deeply located than it was previously remaining about 2 cm above the cavoatrial junction .   Electronically Signed   By: Skipper Cliche M.D.   On: 11/12/2014 14:48    Medications: I have reviewed the patient's current medications.  Assesment:   Active Problems:   RUQ pain   Abdominal pain   Elevated LFTs   Hyperbilirubinemia   Choledocholithiasis with cholecystitis   Right upper quadrant pain   Calculus of bile duct with acute cholangitis with obstruction Post choledochoduodenostomy  Plan:  Medications reviewed Continue hydration As per surgery plan.     LOS: 10 days   Lori Bishop 11/13/2014, 12:42 PM

## 2014-11-14 NOTE — Progress Notes (Signed)
Subjective: Patient is resting. She feeling better. No new complaint.  Objective: Vital signs in last 24 hours: Temp:  [97.8 F (36.6 C)-98 F (36.7 C)] 97.8 F (36.6 C) (09/11 0540) Pulse Rate:  [69-80] 80 (09/11 0540) Resp:  [20] 20 (09/11 0540) BP: (127-155)/(56-76) 155/76 mmHg (09/11 0540) SpO2:  [92 %] 92 % (09/11 0540) Weight change:  Last BM Date: 11/13/14  Intake/Output from previous day: 09/10 0701 - 09/11 0700 In: 70 [I.V.:70] Out: 598 [Urine:550; Drains:48]  PHYSICAL EXAM General appearance: alert, fatigued and no distress Resp: clear to auscultation bilaterally Cardio: S1, S2 normal GI: incision site is clean, stapleas in place, JP drain in place bowel sound ++ Extremities: extremities normal, atraumatic, no cyanosis or edema  Lab Results:  Results for orders placed or performed during the hospital encounter of 11/01/14 (from the past 48 hour(s))  CBC     Status: Abnormal   Collection Time: 11/13/14  6:35 AM  Result Value Ref Range   WBC 12.4 (H) 4.0 - 10.5 K/uL   RBC 3.86 (L) 3.87 - 5.11 MIL/uL   Hemoglobin 11.4 (L) 12.0 - 15.0 g/dL   HCT 40.9 (L) 81.1 - 91.4 %   MCV 86.8 78.0 - 100.0 fL   MCH 29.5 26.0 - 34.0 pg   MCHC 34.0 30.0 - 36.0 g/dL   RDW 78.2 95.6 - 21.3 %   Platelets 341 150 - 400 K/uL    ABGS No results for input(s): PHART, PO2ART, TCO2, HCO3 in the last 72 hours.  Invalid input(s): PCO2 CULTURES No results found for this or any previous visit (from the past 240 hour(s)). Studies/Results: Dg Chest Port 1 View  11/12/2014   CLINICAL DATA:  Central catheter insertion  EXAM: PORTABLE CHEST - 1 VIEW  COMPARISON:  November 05, 2014  FINDINGS: Central catheter tip is in the superior vena cava just beyond the junction with the left innominate vein. No pneumothorax. There is slight bibasilar interstitial edema. There is no airspace consolidation. Heart is mildly enlarged. There is mild pulmonary venous hypertension. No adenopathy. No bone lesions.  There is degenerative change in the left shoulder.  IMPRESSION: Central catheter tip in superior vena cava just beyond the junction with the left innominate vein. No pneumothorax. Mild edema with cardiomegaly and mild pulmonary venous hypertension. These findings indicate a degree of congestive heart failure. No airspace consolidation.   Electronically Signed   By: Bretta Bang III M.D.   On: 11/12/2014 13:02   Dg Chest Port 1v Same Day  11/12/2014   CLINICAL DATA:  Recheck PICC line position  EXAM: PORTABLE CHEST - 1 VIEW SAME DAY  COMPARISON:  11/12/2014  FINDINGS: Left PICC line projects over the superior vena cava about 2 cm above the cavoatrial junction similar to prior study. No pneumothorax.  Stable cardiac enlargement and bilateral interstitial prominence. Stable right lower lobe infiltrate.  IMPRESSION: No significant change in PICC line position allowing for differences in patient rotation. The tip is minimally more deeply located than it was previously remaining about 2 cm above the cavoatrial junction .   Electronically Signed   By: Esperanza Heir M.D.   On: 11/12/2014 14:48    Medications: I have reviewed the patient's current medications.  Assesment:   Active Problems:   RUQ pain   Abdominal pain   Elevated LFTs   Hyperbilirubinemia   Choledocholithiasis with cholecystitis   Right upper quadrant pain   Calculus of bile duct with acute cholangitis with obstruction Post choledochoduodenostomy  Plan:  Medications reviewed Continue hydration As per surgery plan.     LOS: 11 days   Yaakov Saindon 11/14/2014, 11:17 AM

## 2014-11-14 NOTE — Progress Notes (Signed)
5 Days Post-Op  Subjective: States she has had small bowel movement. Tolerating full liquid diet well. States her abdominal pain is intermittent in nature.  Objective: Vital signs in last 24 hours: Temp:  [97.8 F (36.6 C)-98 F (36.7 C)] 97.8 F (36.6 C) (09/11 0540) Pulse Rate:  [69-80] 80 (09/11 0540) Resp:  [20] 20 (09/11 0540) BP: (127-155)/(56-76) 155/76 mmHg (09/11 0540) SpO2:  [92 %] 92 % (09/11 0540) Last BM Date: 11/13/14  Intake/Output from previous day: 09/10 0701 - 09/11 0700 In: 70 [I.V.:70] Out: 598 [Urine:550; Drains:48] Intake/Output this shift:    General appearance: alert, cooperative and no distress Resp: clear to auscultation bilaterally Cardio: regular rate and rhythm, S1, S2 normal, no murmur, click, rub or gallop GI: Soft, incision healing well. No distention noted. Bowel sounds are active. JP drainage is serosanguineous in nature. No bile was present.  Lab Results:   Recent Labs  11/12/14 0541 11/13/14 0635  WBC 15.7* 12.4*  HGB 12.0 11.4*  HCT 34.9* 33.5*  PLT 382 341   BMET  Recent Labs  11/12/14 0541  NA 140  K 3.6  CL 106  CO2 28  GLUCOSE 88  BUN 6  CREATININE 0.66  CALCIUM 8.1*   PT/INR No results for input(s): LABPROT, INR in the last 72 hours.  Studies/Results: Dg Chest Port 1 View  11/12/2014   CLINICAL DATA:  Central catheter insertion  EXAM: PORTABLE CHEST - 1 VIEW  COMPARISON:  November 05, 2014  FINDINGS: Central catheter tip is in the superior vena cava just beyond the junction with the left innominate vein. No pneumothorax. There is slight bibasilar interstitial edema. There is no airspace consolidation. Heart is mildly enlarged. There is mild pulmonary venous hypertension. No adenopathy. No bone lesions. There is degenerative change in the left shoulder.  IMPRESSION: Central catheter tip in superior vena cava just beyond the junction with the left innominate vein. No pneumothorax. Mild edema with cardiomegaly and mild  pulmonary venous hypertension. These findings indicate Bishop degree of congestive heart failure. No airspace consolidation.   Electronically Signed   By: Bretta Bang III M.D.   On: 11/12/2014 13:02   Dg Chest Port 1v Same Day  11/12/2014   CLINICAL DATA:  Recheck PICC line position  EXAM: PORTABLE CHEST - 1 VIEW SAME DAY  COMPARISON:  11/12/2014  FINDINGS: Left PICC line projects over the superior vena cava about 2 cm above the cavoatrial junction similar to prior study. No pneumothorax.  Stable cardiac enlargement and bilateral interstitial prominence. Stable right lower lobe infiltrate.  IMPRESSION: No significant change in PICC line position allowing for differences in patient rotation. The tip is minimally more deeply located than it was previously remaining about 2 cm above the cavoatrial junction .   Electronically Signed   By: Esperanza Heir M.D.   On: 11/12/2014 14:48    Anti-infectives: Anti-infectives    Start     Dose/Rate Route Frequency Ordered Stop   11/02/14 1200  Ampicillin-Sulbactam (UNASYN) 3 g in sodium chloride 0.9 % 100 mL IVPB     3 g 100 mL/hr over 60 Minutes Intravenous Every 6 hours 11/02/14 1009        Assessment/Plan: s/p Procedure(s): CHOLEDOCHODUODENOSTOMY Impression: Stable on postoperative day 5. No anastomotic leak is noted. We'll advance to mechanical soft diet. Anticipate discharge home in the next 24-48 hours. Will need home health assistance.  LOS: 11 days    Lori Bishop 11/14/2014

## 2014-11-15 NOTE — Progress Notes (Signed)
PT Cancellation Note  Patient Details Name: Lori Bishop MRN: [id number] DOB: [birth year]   Cancelled Treatment:    Reason Eval/Treat Not Completed: Pain limiting ability to participate Pt refused to complete PT session today.  Pain scale 10/10 abdominal pain.  Pt stated she had received pain medications and has sat up in chair for close to 2 hours earlier today and does not wish to participate in therapy today.  Pt left in bed with call within reach.  [address], LPTA; CBIS [phone]   Juel Burrow 11/15/2014, 3:08 PM

## 2014-11-15 NOTE — Progress Notes (Signed)
6 Days Post-Op  Subjective: Patient sitting in chair. Looks good.  Objective: Vital signs in last 24 hours: Temp:  [98 F (36.7 C)-98.5 F (36.9 C)] 98 F (36.7 C) (09/12 0456) Pulse Rate:  [73-91] 73 (09/12 0456) Resp:  [20] 20 (09/12 0456) BP: (130-142)/(56-66) 142/66 mmHg (09/12 0456) SpO2:  [91 %-96 %] 96 % (09/12 0456) Last BM Date: 11/13/14  Intake/Output from previous day: 09/11 0701 - 09/12 0700 In: -  Out: 60 [Drains:60] Intake/Output this shift: Total I/O In: 180 [P.O.:180] Out: -   General appearance: alert, cooperative and no distress Resp: clear to auscultation bilaterally Cardio: regular rate and rhythm, S1, S2 normal, no murmur, click, rub or gallop GI: Abdomen is soft and flat. Incision healing well. JP drain removed.  Lab Results:   Recent Labs  11/13/14 0635  WBC 12.4*  HGB 11.4*  HCT 33.5*  PLT 341   BMET No results for input(s): NA, K, CL, CO2, GLUCOSE, BUN, CREATININE, CALCIUM in the last 72 hours. PT/INR No results for input(s): LABPROT, INR in the last 72 hours.  Studies/Results: No results found.  Anti-infectives: Anti-infectives    Start     Dose/Rate Route Frequency Ordered Stop   11/02/14 1200  Ampicillin-Sulbactam (UNASYN) 3 g in sodium chloride 0.9 % 100 mL IVPB  Status:  Discontinued     3 g 100 mL/hr over 60 Minutes Intravenous Every 6 hours 11/02/14 1009 11/15/14 1009      Assessment/Plan: s/p Procedure(s): CHOLEDOCHODUODENOSTOMY Impression: Patient continues to recover very well. JP drain has been removed. Anticipate discharge in next 24-48 hours. Will stop Unasyn.  LOS: 12 days    Dennette Faulconer A 11/15/2014

## 2014-11-15 NOTE — Progress Notes (Signed)
Subjective: Patient is progressively improving. She has tolerated regular diet. No complaint.t.  Objective: Vital signs in last 24 hours: Temp:  [98 F (36.7 C)-98.5 F (36.9 C)] 98 F (36.7 C) (09/12 0456) Pulse Rate:  [73-91] 73 (09/12 0456) Resp:  [20] 20 (09/12 0456) BP: (130-142)/(56-66) 142/66 mmHg (09/12 0456) SpO2:  [91 %-96 %] 96 % (09/12 0456) Weight change:  Last BM Date: 11/13/14  Intake/Output from previous day: 09/11 0701 - 09/12 0700 In: -  Out: 60 [Drains:60]  PHYSICAL EXAM General appearance: alert, fatigued and no distress Resp: clear to auscultation bilaterally Cardio: S1, S2 normal GI: incision site is clean, stapleas in place, JP drain in place bowel sound ++ Extremities: extremities normal, atraumatic, no cyanosis or edema  Lab Results:  No results found for this or any previous visit (from the past 48 hour(s)).  ABGS No results for input(s): PHART, PO2ART, TCO2, HCO3 in the last 72 hours.  Invalid input(s): PCO2 CULTURES No results found for this or any previous visit (from the past 240 hour(s)). Studies/Results: No results found.  Medications: I have reviewed the patient's current medications.  Assesment:   Active Problems:   RUQ pain   Abdominal pain   Elevated LFTs   Hyperbilirubinemia   Choledocholithiasis with cholecystitis   Right upper quadrant pain   Calculus of bile duct with acute cholangitis with obstruction Post choledochoduodenostomy  Plan:  Medications reviewed Continue hydration As per surgery plan.     LOS: 12 days   Chandlar Guice 11/15/2014, 8:34 AM

## 2014-11-16 ENCOUNTER — Other Ambulatory Visit: Payer: Self-pay | Admitting: Neurology

## 2014-11-16 ENCOUNTER — Telehealth: Payer: Self-pay | Admitting: Internal Medicine

## 2014-11-16 MED ORDER — HYDROCODONE-ACETAMINOPHEN 5-325 MG PO TABS: 1.0000 | ORAL_TABLET | ORAL | Status: DC | PRN

## 2014-11-16 NOTE — Progress Notes (Signed)
Pt. discharged home today. Pt. Received discharge instructions, prescription. Pt. Verbalized understanding and has no questions or concerns at this time. Pt.'s PICC line removed with catheter intact,no bleeding or complications. Pt. Left unit in stable condition in wheelchair with staff member.

## 2014-11-16 NOTE — Care Management Note (Addendum)
Case Management Note  Patient Details  Name: TAELOR WAYMIRE MRN: [id number] Date of Birth: May 20, 1920  Expected Discharge Date:                  Expected Discharge Plan:  Home w Home Health Services  In-House Referral:  NA  Discharge planning Services  CM Consult  Post Acute Care Choice:  NA, Durable Medical Equipment Choice offered to:  NA  DME Arranged:  Wheelchair manual DME Agency:  [facility]  HH Arranged:  RN, PT, Nurse's Aide HH Agency:  [facility]  Status of Service:  Completed, signed off  Medicare Important Message Given:  Yes-fourth notification given Date Medicare IM Given:    Medicare IM give by:    Date Additional Medicare IM Given:    Additional Medicare Important Message give by:     If discussed at Long Length of Stay Meetings, dates discussed:  11/16/2014  Additional Comments: Pt discharging home today with HH. [facility] made aware of DC home today. No further CM needs noted.  Malcolm Metro, RN 11/16/2014, 2:09 PM

## 2014-11-16 NOTE — Discharge Summary (Signed)
Physician Discharge Summary  Patient ID: Lori Bishop MRN: [id number] DOB/AGE: [birth year] 81 y.o.  Admit date: 11/01/2014 Discharge date: 11/16/2014  Admission Diagnoses: Cholangitis, choledocholithiasis  Discharge Diagnoses: Same Active Problems:   RUQ pain   Abdominal pain   Elevated LFTs   Hyperbilirubinemia   Choledocholithiasis with cholecystitis   Right upper quadrant pain   Calculus of bile duct with acute cholangitis with obstruction   Discharged Condition: good  Hospital Course: Patient is a 81 year old black female with a known history of recurrent choledocholithiasis who presented to the emergency room with worsening upper abdominal pain and fevers. She was found to have cholangitis. MRCP confirmed choledocholithiasis. A GI consultation was obtained. It was felt that the patient has had multiple ERCPs in the past and would benefit from a choledochoenterostomy. She was continued on IV antibiotics and once her liver enzyme tests and leukocytosis normalized, she underwent a choledochoduodenostomy on 11/09/2014. She tolerated the surgery remarkably well. Her diet was advanced once her nasogastric tube was removed. Her JP drain has been removed. She is being discharged home on 11/16/2014 in good and improving condition.  Consults: GI and general surgery  Treatments: surgery: Choledochoduodenostomy on 11/09/2014  Discharge Exam: Blood pressure 147/65, pulse 73, temperature 97.6 F (36.4 C), temperature source Oral, resp. rate 20, height 5' (1.524 m), weight 66.724 kg (147 lb 1.6 oz), SpO2 100 %. General appearance: alert, cooperative and no distress Resp: clear to auscultation bilaterally Cardio: regular rate and rhythm, S1, S2 normal, no murmur, click, rub or gallop GI: Soft. Incision healing well. Bowel sounds active.  Disposition: 01-Home or Self Care     Medication List    STOP taking these medications        ursodiol 300 MG capsule  Commonly known as:  ACTIGALL       TAKE these medications        allopurinol 100 MG tablet  Commonly known as:  ZYLOPRIM  Take 100 mg by mouth daily.     felodipine 10 MG 24 hr tablet  Commonly known as:  PLENDIL  Take 10 mg by mouth daily.     fentaNYL 50 MCG/HR  Commonly known as:  DURAGESIC - dosed mcg/hr  Place 50 mcg onto the skin every 3 (three) days.     furosemide 40 MG tablet  Commonly known as:  LASIX  Take 60 mg by mouth 2 (two) times daily.     gabapentin 100 MG capsule  Commonly known as:  NEURONTIN  TAKE 3 CAPSULES BY MOUTH THREE TIMES DAILY.     HYDROcodone-acetaminophen 5-325 MG per tablet  Commonly known as:  NORCO/VICODIN  Take 1 tablet by mouth every 4 (four) hours as needed for moderate pain or severe pain.     metolazone 2.5 MG tablet  Commonly known as:  ZAROXOLYN  Take 2.5 mg by mouth daily as needed (for fluid).     polyethylene glycol packet  Commonly known as:  MIRALAX / GLYCOLAX  Take 17 g by mouth daily as needed for moderate constipation.     potassium chloride SA 20 MEQ tablet  Commonly known as:  K-DUR,KLOR-CON  Take 40 mEq by mouth daily.     traMADol-acetaminophen 37.5-325 MG per tablet  Commonly known as:  ULTRACET  Take 1 tablet by mouth 3 (three) times daily as needed for pain.           Follow-up Information    Follow up with Dalia Heading, MD. Schedule an appointment as soon  as possible for a visit on 11/23/2014.   Specialty:  General Surgery   Contact information:   [address] [place] [address] [phone]       Signed: Franky Macho A 11/16/2014, 8:00 AM

## 2014-11-16 NOTE — Telephone Encounter (Signed)
[phone]  PLEASE CALL PATIENT NIECE GERDIA REGARDING PT MEDICATIONS   WANTS TO KNOW IF SHE NEEDS TO CONTINUE TAKING URSODIOL 

## 2014-11-16 NOTE — Progress Notes (Signed)
Subjective: Patient feels much better. Her JP drain is removed and she planned for discharge today. She is tolerating regular diet. No new complaint.  Objective: Vital signs in last 24 hours: Temp:  [97.6 F (36.4 C)-99.4 F (37.4 C)] 97.6 F (36.4 C) (09/13 0526) Pulse Rate:  [71-84] 73 (09/13 0526) Resp:  [20] 20 (09/13 0526) BP: (136-147)/(57-65) 147/65 mmHg (09/13 0526) SpO2:  [91 %-100 %] 100 % (09/13 0526) Weight:  [66.724 kg (147 lb 1.6 oz)] 66.724 kg (147 lb 1.6 oz) (09/13 0526) Weight change:  Last BM Date: 11/13/14  Intake/Output from previous day: 09/12 0701 - 09/13 0700 In: 300 [P.O.:300] Out: 900 [Urine:900]  PHYSICAL EXAM General appearance: alert, fatigued and no distress Resp: clear to auscultation bilaterally Cardio: S1, S2 normal GI: incision site is clean, stapleas in place, bowel sound ++ Extremities: extremities normal, atraumatic, no cyanosis or edema  Lab Results:  No results found for this or any previous visit (from the past 48 hour(s)).  ABGS No results for input(s): PHART, PO2ART, TCO2, HCO3 in the last 72 hours.  Invalid input(s): PCO2 CULTURES No results found for this or any previous visit (from the past 240 hour(s)). Studies/Results: No results found.  Medications: I have reviewed the patient's current medications.  Assesment:   Active Problems:   RUQ pain   Abdominal pain   Elevated LFTs   Hyperbilirubinemia   Choledocholithiasis with cholecystitis   Right upper quadrant pain   Calculus of bile duct with acute cholangitis with obstruction Post choledochoduodenostomy  Plan:  Medications reviewed Continue hydration Discharge home as planned by surgery.     LOS: 13 days   Tamer Baughman 11/16/2014, 8:18 AM

## 2014-11-16 NOTE — Care Management Important Message (Signed)
Important Message  Patient Details  Name: Lori Bishop MRN: [id number] Date of Birth: 19-Oct-1920   Medicare Important Message Given:  Yes-fourth notification given    Malcolm Metro, RN 11/16/2014, 2:09 PM

## 2014-11-16 NOTE — Telephone Encounter (Signed)
I showed AS the discharge summary from the hospital which Dr. Lovell Sheehan said for pt to D/C ursodiol, I spoke with Bosie Helper, informed her that per Dr.Jenkins instructions, the pt does not need to take ursodiol. She verbalized understanding.

## 2014-11-17 DIAGNOSIS — I10 Essential (primary) hypertension: Secondary | ICD-10-CM | POA: Diagnosis not present

## 2014-11-17 DIAGNOSIS — K8031 Calculus of bile duct with cholangitis, unspecified, with obstruction: Secondary | ICD-10-CM | POA: Diagnosis not present

## 2014-11-17 DIAGNOSIS — Z934 Other artificial openings of gastrointestinal tract status: Secondary | ICD-10-CM | POA: Diagnosis not present

## 2014-11-17 DIAGNOSIS — M1990 Unspecified osteoarthritis, unspecified site: Secondary | ICD-10-CM | POA: Diagnosis not present

## 2014-11-17 DIAGNOSIS — Z48815 Encounter for surgical aftercare following surgery on the digestive system: Secondary | ICD-10-CM | POA: Diagnosis not present

## 2014-11-17 DIAGNOSIS — R7989 Other specified abnormal findings of blood chemistry: Secondary | ICD-10-CM | POA: Diagnosis not present

## 2014-11-18 DIAGNOSIS — R7989 Other specified abnormal findings of blood chemistry: Secondary | ICD-10-CM | POA: Diagnosis not present

## 2014-11-18 DIAGNOSIS — D649 Anemia, unspecified: Secondary | ICD-10-CM | POA: Diagnosis not present

## 2014-11-18 DIAGNOSIS — Z934 Other artificial openings of gastrointestinal tract status: Secondary | ICD-10-CM | POA: Diagnosis not present

## 2014-11-18 DIAGNOSIS — K830 Cholangitis: Secondary | ICD-10-CM | POA: Diagnosis not present

## 2014-11-18 DIAGNOSIS — K810 Acute cholecystitis: Secondary | ICD-10-CM | POA: Diagnosis not present

## 2014-11-18 DIAGNOSIS — I10 Essential (primary) hypertension: Secondary | ICD-10-CM | POA: Diagnosis not present

## 2014-11-18 DIAGNOSIS — K8031 Calculus of bile duct with cholangitis, unspecified, with obstruction: Secondary | ICD-10-CM | POA: Diagnosis not present

## 2014-11-18 DIAGNOSIS — M1990 Unspecified osteoarthritis, unspecified site: Secondary | ICD-10-CM | POA: Diagnosis not present

## 2014-11-18 DIAGNOSIS — Z48815 Encounter for surgical aftercare following surgery on the digestive system: Secondary | ICD-10-CM | POA: Diagnosis not present

## 2014-11-19 DIAGNOSIS — Z934 Other artificial openings of gastrointestinal tract status: Secondary | ICD-10-CM | POA: Diagnosis not present

## 2014-11-19 DIAGNOSIS — M1990 Unspecified osteoarthritis, unspecified site: Secondary | ICD-10-CM | POA: Diagnosis not present

## 2014-11-19 DIAGNOSIS — K8031 Calculus of bile duct with cholangitis, unspecified, with obstruction: Secondary | ICD-10-CM | POA: Diagnosis not present

## 2014-11-19 DIAGNOSIS — R7989 Other specified abnormal findings of blood chemistry: Secondary | ICD-10-CM | POA: Diagnosis not present

## 2014-11-19 DIAGNOSIS — Z48815 Encounter for surgical aftercare following surgery on the digestive system: Secondary | ICD-10-CM | POA: Diagnosis not present

## 2014-11-19 DIAGNOSIS — I10 Essential (primary) hypertension: Secondary | ICD-10-CM | POA: Diagnosis not present

## 2014-11-19 DIAGNOSIS — S31109A Unspecified open wound of abdominal wall, unspecified quadrant without penetration into peritoneal cavity, initial encounter: Secondary | ICD-10-CM | POA: Diagnosis not present

## 2014-11-22 DIAGNOSIS — I10 Essential (primary) hypertension: Secondary | ICD-10-CM | POA: Diagnosis not present

## 2014-11-22 DIAGNOSIS — S31109A Unspecified open wound of abdominal wall, unspecified quadrant without penetration into peritoneal cavity, initial encounter: Secondary | ICD-10-CM | POA: Diagnosis not present

## 2014-11-22 DIAGNOSIS — M1990 Unspecified osteoarthritis, unspecified site: Secondary | ICD-10-CM | POA: Diagnosis not present

## 2014-11-22 DIAGNOSIS — Z48815 Encounter for surgical aftercare following surgery on the digestive system: Secondary | ICD-10-CM | POA: Diagnosis not present

## 2014-11-22 DIAGNOSIS — K8031 Calculus of bile duct with cholangitis, unspecified, with obstruction: Secondary | ICD-10-CM | POA: Diagnosis not present

## 2014-11-22 DIAGNOSIS — R7989 Other specified abnormal findings of blood chemistry: Secondary | ICD-10-CM | POA: Diagnosis not present

## 2014-11-22 DIAGNOSIS — Z934 Other artificial openings of gastrointestinal tract status: Secondary | ICD-10-CM | POA: Diagnosis not present

## 2014-11-23 DIAGNOSIS — I10 Essential (primary) hypertension: Secondary | ICD-10-CM | POA: Diagnosis not present

## 2014-11-23 DIAGNOSIS — R7989 Other specified abnormal findings of blood chemistry: Secondary | ICD-10-CM | POA: Diagnosis not present

## 2014-11-23 DIAGNOSIS — K8031 Calculus of bile duct with cholangitis, unspecified, with obstruction: Secondary | ICD-10-CM | POA: Diagnosis not present

## 2014-11-23 DIAGNOSIS — Z48815 Encounter for surgical aftercare following surgery on the digestive system: Secondary | ICD-10-CM | POA: Diagnosis not present

## 2014-11-23 DIAGNOSIS — Z934 Other artificial openings of gastrointestinal tract status: Secondary | ICD-10-CM | POA: Diagnosis not present

## 2014-11-23 DIAGNOSIS — M1990 Unspecified osteoarthritis, unspecified site: Secondary | ICD-10-CM | POA: Diagnosis not present

## 2014-11-24 DIAGNOSIS — I10 Essential (primary) hypertension: Secondary | ICD-10-CM | POA: Diagnosis not present

## 2014-11-24 DIAGNOSIS — N182 Chronic kidney disease, stage 2 (mild): Secondary | ICD-10-CM | POA: Diagnosis not present

## 2014-11-24 DIAGNOSIS — M5489 Other dorsalgia: Secondary | ICD-10-CM | POA: Diagnosis not present

## 2014-11-24 DIAGNOSIS — M1990 Unspecified osteoarthritis, unspecified site: Secondary | ICD-10-CM | POA: Diagnosis not present

## 2014-11-24 DIAGNOSIS — Z934 Other artificial openings of gastrointestinal tract status: Secondary | ICD-10-CM | POA: Diagnosis not present

## 2014-11-24 DIAGNOSIS — K8031 Calculus of bile duct with cholangitis, unspecified, with obstruction: Secondary | ICD-10-CM | POA: Diagnosis not present

## 2014-11-24 DIAGNOSIS — M159 Polyosteoarthritis, unspecified: Secondary | ICD-10-CM | POA: Diagnosis not present

## 2014-11-24 DIAGNOSIS — Z23 Encounter for immunization: Secondary | ICD-10-CM | POA: Diagnosis not present

## 2014-11-24 DIAGNOSIS — Z48815 Encounter for surgical aftercare following surgery on the digestive system: Secondary | ICD-10-CM | POA: Diagnosis not present

## 2014-11-24 DIAGNOSIS — R7989 Other specified abnormal findings of blood chemistry: Secondary | ICD-10-CM | POA: Diagnosis not present

## 2014-11-25 DIAGNOSIS — M1990 Unspecified osteoarthritis, unspecified site: Secondary | ICD-10-CM | POA: Diagnosis not present

## 2014-11-25 DIAGNOSIS — Z48815 Encounter for surgical aftercare following surgery on the digestive system: Secondary | ICD-10-CM | POA: Diagnosis not present

## 2014-11-25 DIAGNOSIS — Z934 Other artificial openings of gastrointestinal tract status: Secondary | ICD-10-CM | POA: Diagnosis not present

## 2014-11-25 DIAGNOSIS — R7989 Other specified abnormal findings of blood chemistry: Secondary | ICD-10-CM | POA: Diagnosis not present

## 2014-11-25 DIAGNOSIS — I10 Essential (primary) hypertension: Secondary | ICD-10-CM | POA: Diagnosis not present

## 2014-11-25 DIAGNOSIS — K8031 Calculus of bile duct with cholangitis, unspecified, with obstruction: Secondary | ICD-10-CM | POA: Diagnosis not present

## 2014-11-26 DIAGNOSIS — Z934 Other artificial openings of gastrointestinal tract status: Secondary | ICD-10-CM | POA: Diagnosis not present

## 2014-11-26 DIAGNOSIS — R7989 Other specified abnormal findings of blood chemistry: Secondary | ICD-10-CM | POA: Diagnosis not present

## 2014-11-26 DIAGNOSIS — Z48815 Encounter for surgical aftercare following surgery on the digestive system: Secondary | ICD-10-CM | POA: Diagnosis not present

## 2014-11-26 DIAGNOSIS — I10 Essential (primary) hypertension: Secondary | ICD-10-CM | POA: Diagnosis not present

## 2014-11-26 DIAGNOSIS — K8031 Calculus of bile duct with cholangitis, unspecified, with obstruction: Secondary | ICD-10-CM | POA: Diagnosis not present

## 2014-11-26 DIAGNOSIS — M1990 Unspecified osteoarthritis, unspecified site: Secondary | ICD-10-CM | POA: Diagnosis not present

## 2014-11-29 DIAGNOSIS — Z934 Other artificial openings of gastrointestinal tract status: Secondary | ICD-10-CM | POA: Diagnosis not present

## 2014-11-29 DIAGNOSIS — M1990 Unspecified osteoarthritis, unspecified site: Secondary | ICD-10-CM | POA: Diagnosis not present

## 2014-11-29 DIAGNOSIS — K8031 Calculus of bile duct with cholangitis, unspecified, with obstruction: Secondary | ICD-10-CM | POA: Diagnosis not present

## 2014-11-29 DIAGNOSIS — I10 Essential (primary) hypertension: Secondary | ICD-10-CM | POA: Diagnosis not present

## 2014-11-29 DIAGNOSIS — Z48815 Encounter for surgical aftercare following surgery on the digestive system: Secondary | ICD-10-CM | POA: Diagnosis not present

## 2014-11-29 DIAGNOSIS — R7989 Other specified abnormal findings of blood chemistry: Secondary | ICD-10-CM | POA: Diagnosis not present

## 2014-11-30 DIAGNOSIS — R7989 Other specified abnormal findings of blood chemistry: Secondary | ICD-10-CM | POA: Diagnosis not present

## 2014-11-30 DIAGNOSIS — Z934 Other artificial openings of gastrointestinal tract status: Secondary | ICD-10-CM | POA: Diagnosis not present

## 2014-11-30 DIAGNOSIS — M1990 Unspecified osteoarthritis, unspecified site: Secondary | ICD-10-CM | POA: Diagnosis not present

## 2014-11-30 DIAGNOSIS — Z48815 Encounter for surgical aftercare following surgery on the digestive system: Secondary | ICD-10-CM | POA: Diagnosis not present

## 2014-11-30 DIAGNOSIS — I10 Essential (primary) hypertension: Secondary | ICD-10-CM | POA: Diagnosis not present

## 2014-11-30 DIAGNOSIS — K8031 Calculus of bile duct with cholangitis, unspecified, with obstruction: Secondary | ICD-10-CM | POA: Diagnosis not present

## 2014-12-01 DIAGNOSIS — I10 Essential (primary) hypertension: Secondary | ICD-10-CM | POA: Diagnosis not present

## 2014-12-01 DIAGNOSIS — K8031 Calculus of bile duct with cholangitis, unspecified, with obstruction: Secondary | ICD-10-CM | POA: Diagnosis not present

## 2014-12-01 DIAGNOSIS — Z48815 Encounter for surgical aftercare following surgery on the digestive system: Secondary | ICD-10-CM | POA: Diagnosis not present

## 2014-12-01 DIAGNOSIS — Z934 Other artificial openings of gastrointestinal tract status: Secondary | ICD-10-CM | POA: Diagnosis not present

## 2014-12-01 DIAGNOSIS — R7989 Other specified abnormal findings of blood chemistry: Secondary | ICD-10-CM | POA: Diagnosis not present

## 2014-12-01 DIAGNOSIS — M1990 Unspecified osteoarthritis, unspecified site: Secondary | ICD-10-CM | POA: Diagnosis not present

## 2014-12-02 DIAGNOSIS — I10 Essential (primary) hypertension: Secondary | ICD-10-CM | POA: Diagnosis not present

## 2014-12-02 DIAGNOSIS — M1990 Unspecified osteoarthritis, unspecified site: Secondary | ICD-10-CM | POA: Diagnosis not present

## 2014-12-02 DIAGNOSIS — Z48815 Encounter for surgical aftercare following surgery on the digestive system: Secondary | ICD-10-CM | POA: Diagnosis not present

## 2014-12-02 DIAGNOSIS — R7989 Other specified abnormal findings of blood chemistry: Secondary | ICD-10-CM | POA: Diagnosis not present

## 2014-12-02 DIAGNOSIS — K8031 Calculus of bile duct with cholangitis, unspecified, with obstruction: Secondary | ICD-10-CM | POA: Diagnosis not present

## 2014-12-02 DIAGNOSIS — Z934 Other artificial openings of gastrointestinal tract status: Secondary | ICD-10-CM | POA: Diagnosis not present

## 2014-12-03 DIAGNOSIS — Z48815 Encounter for surgical aftercare following surgery on the digestive system: Secondary | ICD-10-CM | POA: Diagnosis not present

## 2014-12-03 DIAGNOSIS — R7989 Other specified abnormal findings of blood chemistry: Secondary | ICD-10-CM | POA: Diagnosis not present

## 2014-12-03 DIAGNOSIS — I10 Essential (primary) hypertension: Secondary | ICD-10-CM | POA: Diagnosis not present

## 2014-12-03 DIAGNOSIS — M1990 Unspecified osteoarthritis, unspecified site: Secondary | ICD-10-CM | POA: Diagnosis not present

## 2014-12-03 DIAGNOSIS — K8031 Calculus of bile duct with cholangitis, unspecified, with obstruction: Secondary | ICD-10-CM | POA: Diagnosis not present

## 2014-12-03 DIAGNOSIS — Z934 Other artificial openings of gastrointestinal tract status: Secondary | ICD-10-CM | POA: Diagnosis not present

## 2014-12-06 DIAGNOSIS — I10 Essential (primary) hypertension: Secondary | ICD-10-CM | POA: Diagnosis not present

## 2014-12-06 DIAGNOSIS — Z934 Other artificial openings of gastrointestinal tract status: Secondary | ICD-10-CM | POA: Diagnosis not present

## 2014-12-06 DIAGNOSIS — K8031 Calculus of bile duct with cholangitis, unspecified, with obstruction: Secondary | ICD-10-CM | POA: Diagnosis not present

## 2014-12-06 DIAGNOSIS — M1990 Unspecified osteoarthritis, unspecified site: Secondary | ICD-10-CM | POA: Diagnosis not present

## 2014-12-06 DIAGNOSIS — Z48815 Encounter for surgical aftercare following surgery on the digestive system: Secondary | ICD-10-CM | POA: Diagnosis not present

## 2014-12-06 DIAGNOSIS — R7989 Other specified abnormal findings of blood chemistry: Secondary | ICD-10-CM | POA: Diagnosis not present

## 2014-12-07 DIAGNOSIS — Z934 Other artificial openings of gastrointestinal tract status: Secondary | ICD-10-CM | POA: Diagnosis not present

## 2014-12-07 DIAGNOSIS — K8031 Calculus of bile duct with cholangitis, unspecified, with obstruction: Secondary | ICD-10-CM | POA: Diagnosis not present

## 2014-12-07 DIAGNOSIS — I10 Essential (primary) hypertension: Secondary | ICD-10-CM | POA: Diagnosis not present

## 2014-12-07 DIAGNOSIS — R7989 Other specified abnormal findings of blood chemistry: Secondary | ICD-10-CM | POA: Diagnosis not present

## 2014-12-07 DIAGNOSIS — Z48815 Encounter for surgical aftercare following surgery on the digestive system: Secondary | ICD-10-CM | POA: Diagnosis not present

## 2014-12-07 DIAGNOSIS — M1990 Unspecified osteoarthritis, unspecified site: Secondary | ICD-10-CM | POA: Diagnosis not present

## 2014-12-09 DIAGNOSIS — I10 Essential (primary) hypertension: Secondary | ICD-10-CM | POA: Diagnosis not present

## 2014-12-09 DIAGNOSIS — M1990 Unspecified osteoarthritis, unspecified site: Secondary | ICD-10-CM | POA: Diagnosis not present

## 2014-12-09 DIAGNOSIS — Z934 Other artificial openings of gastrointestinal tract status: Secondary | ICD-10-CM | POA: Diagnosis not present

## 2014-12-09 DIAGNOSIS — Z48815 Encounter for surgical aftercare following surgery on the digestive system: Secondary | ICD-10-CM | POA: Diagnosis not present

## 2014-12-09 DIAGNOSIS — K8031 Calculus of bile duct with cholangitis, unspecified, with obstruction: Secondary | ICD-10-CM | POA: Diagnosis not present

## 2014-12-09 DIAGNOSIS — R7989 Other specified abnormal findings of blood chemistry: Secondary | ICD-10-CM | POA: Diagnosis not present

## 2014-12-14 DIAGNOSIS — R7989 Other specified abnormal findings of blood chemistry: Secondary | ICD-10-CM | POA: Diagnosis not present

## 2014-12-14 DIAGNOSIS — Z48815 Encounter for surgical aftercare following surgery on the digestive system: Secondary | ICD-10-CM | POA: Diagnosis not present

## 2014-12-14 DIAGNOSIS — I10 Essential (primary) hypertension: Secondary | ICD-10-CM | POA: Diagnosis not present

## 2014-12-14 DIAGNOSIS — Z934 Other artificial openings of gastrointestinal tract status: Secondary | ICD-10-CM | POA: Diagnosis not present

## 2014-12-14 DIAGNOSIS — M1990 Unspecified osteoarthritis, unspecified site: Secondary | ICD-10-CM | POA: Diagnosis not present

## 2014-12-14 DIAGNOSIS — K8031 Calculus of bile duct with cholangitis, unspecified, with obstruction: Secondary | ICD-10-CM | POA: Diagnosis not present

## 2014-12-17 DIAGNOSIS — Z48815 Encounter for surgical aftercare following surgery on the digestive system: Secondary | ICD-10-CM | POA: Diagnosis not present

## 2014-12-17 DIAGNOSIS — Z934 Other artificial openings of gastrointestinal tract status: Secondary | ICD-10-CM | POA: Diagnosis not present

## 2014-12-17 DIAGNOSIS — K8031 Calculus of bile duct with cholangitis, unspecified, with obstruction: Secondary | ICD-10-CM | POA: Diagnosis not present

## 2014-12-17 DIAGNOSIS — M1990 Unspecified osteoarthritis, unspecified site: Secondary | ICD-10-CM | POA: Diagnosis not present

## 2014-12-17 DIAGNOSIS — I10 Essential (primary) hypertension: Secondary | ICD-10-CM | POA: Diagnosis not present

## 2014-12-17 DIAGNOSIS — R7989 Other specified abnormal findings of blood chemistry: Secondary | ICD-10-CM | POA: Diagnosis not present

## 2014-12-17 DIAGNOSIS — T1490 Injury, unspecified: Secondary | ICD-10-CM | POA: Diagnosis not present

## 2015-01-06 ENCOUNTER — Ambulatory Visit (INDEPENDENT_AMBULATORY_CARE_PROVIDER_SITE_OTHER): Payer: Medicare Other | Admitting: Otolaryngology

## 2015-01-06 DIAGNOSIS — H903 Sensorineural hearing loss, bilateral: Secondary | ICD-10-CM | POA: Diagnosis not present

## 2015-01-06 DIAGNOSIS — H6123 Impacted cerumen, bilateral: Secondary | ICD-10-CM

## 2015-02-10 DIAGNOSIS — J028 Acute pharyngitis due to other specified organisms: Secondary | ICD-10-CM | POA: Diagnosis not present

## 2015-02-10 DIAGNOSIS — I10 Essential (primary) hypertension: Secondary | ICD-10-CM | POA: Diagnosis not present

## 2015-04-26 ENCOUNTER — Ambulatory Visit (INDEPENDENT_AMBULATORY_CARE_PROVIDER_SITE_OTHER): Payer: Medicare Other | Admitting: Orthopaedic Surgery

## 2015-04-26 ENCOUNTER — Encounter: Payer: Self-pay | Admitting: Orthopaedic Surgery

## 2015-04-26 VITALS — BP 103/61 | HR 87 | Temp 98.1°F | Resp 16 | Ht <= 58 in | Wt 123.0 lb

## 2015-04-26 DIAGNOSIS — M25562 Pain in left knee: Secondary | ICD-10-CM

## 2015-04-26 DIAGNOSIS — M25561 Pain in right knee: Secondary | ICD-10-CM | POA: Diagnosis not present

## 2015-04-26 NOTE — Progress Notes (Signed)
Chief complaint:  Bilateral knee pain  She has chronic bilateral knee pain.  She has no new trauma.  She has popping and swelling.  She is not a candidate for any surgery.  She receives injections which help both knees for about three to four weeks at a time.  The patient request injection, verbal consent was obtained.  The right knee was prepped appropriately after time out was performed.   Sterile technique was observed and injection of 1 cc of Depo-Medrol 40 mg with several cc's of plain xylocaine. Anesthesia was provided by ethyl chloride and a 20-gauge needle was used to inject the knee area. The injection was tolerated well.  A band aid dressing was applied.  The patient was advised to apply ice later today and tomorrow to the injection sight as needed.  The patient request injection, verbal consent was obtained.  The left knee was prepped appropriately after time out was performed.   Sterile technique was observed and injection of 1 cc of Depo-Medrol 40 mg with several cc's of plain xylocaine. Anesthesia was provided by ethyl chloride and a 20-gauge needle was used to inject the knee area. The injection was tolerated well.  A band aid dressing was applied.  The patient was advised to apply ice later today and tomorrow to the injection sight as needed.  I will see her back in one month.  Call if any problem.

## 2015-04-26 NOTE — Patient Instructions (Signed)

## 2015-05-19 DIAGNOSIS — R52 Pain, unspecified: Secondary | ICD-10-CM | POA: Diagnosis not present

## 2015-05-19 DIAGNOSIS — I10 Essential (primary) hypertension: Secondary | ICD-10-CM | POA: Diagnosis not present

## 2015-05-19 DIAGNOSIS — M159 Polyosteoarthritis, unspecified: Secondary | ICD-10-CM | POA: Diagnosis not present

## 2015-05-19 DIAGNOSIS — M5489 Other dorsalgia: Secondary | ICD-10-CM | POA: Diagnosis not present

## 2015-05-24 ENCOUNTER — Ambulatory Visit (INDEPENDENT_AMBULATORY_CARE_PROVIDER_SITE_OTHER): Payer: Medicare Other | Admitting: Orthopaedic Surgery

## 2015-05-24 ENCOUNTER — Encounter: Payer: Self-pay | Admitting: Orthopaedic Surgery

## 2015-05-24 VITALS — BP 132/63 | HR 78 | Temp 97.1°F | Resp 16 | Ht <= 58 in | Wt 127.0 lb

## 2015-05-24 DIAGNOSIS — M25561 Pain in right knee: Secondary | ICD-10-CM | POA: Diagnosis not present

## 2015-05-24 DIAGNOSIS — M25562 Pain in left knee: Secondary | ICD-10-CM

## 2015-05-24 NOTE — Progress Notes (Signed)
CC:  My bilateral knees are still hurting.  I would like an injection.  The patient has had chronic pain and tenderness of the right and left knee for some time.  Injections help.  There is no locking or giving way of the knee.  There is no new trauma.  The bilateral knees have a mild effusion and some crepitus.  There is no redness or signs of recent trauma.  Impression:  Chronic pain of the bilateral knees  Return:  One month.  PROCEDURE NOTE:  The patient request injection, verbal consent was obtained.  Theright knee was prepped appropriately after time out was performed.   Sterile technique was observed and injection of 1 cc of Depo-Medrol 40 mg with several cc's of plain xylocaine. Anesthesia was provided by ethyl chloride and a 20-gauge needle was used to inject the knee area. The injection was tolerated well.  A band aid dressing was applied.  The patient was advised to apply ice later today and tomorrow to the injection sight as needed.  PROCEDURE NOTE:  The patient request injection, verbal consent was obtained.  Theleft knee was prepped appropriately after time out was performed.   Sterile technique was observed and injection of 1 cc of Depo-Medrol 40 mg with several cc's of plain xylocaine. Anesthesia was provided by ethyl chloride and a 20-gauge needle was used to inject the knee area. The injection was tolerated well.  A band aid dressing was applied.  The patient was advised to apply ice later today and tomorrow to the injection sight as needed.

## 2015-06-13 ENCOUNTER — Emergency Department (HOSPITAL_COMMUNITY): Payer: Medicare Other

## 2015-06-13 ENCOUNTER — Observation Stay (HOSPITAL_COMMUNITY)
Admission: EM | Admit: 2015-06-13 | Discharge: 2015-06-16 | Disposition: A | Discharge status: Home / Self Care | Payer: Medicare Other | Attending: Internal Medicine | Admitting: Internal Medicine

## 2015-06-13 ENCOUNTER — Telehealth: Payer: Self-pay

## 2015-06-13 ENCOUNTER — Encounter (HOSPITAL_COMMUNITY): Payer: Self-pay | Admitting: *Deleted

## 2015-06-13 DIAGNOSIS — E876 Hypokalemia: Secondary | ICD-10-CM | POA: Diagnosis present

## 2015-06-13 DIAGNOSIS — J189 Pneumonia, unspecified organism: Principal | ICD-10-CM | POA: Insufficient documentation

## 2015-06-13 DIAGNOSIS — Z79899 Other long term (current) drug therapy: Secondary | ICD-10-CM | POA: Diagnosis not present

## 2015-06-13 DIAGNOSIS — Z87891 Personal history of nicotine dependence: Secondary | ICD-10-CM | POA: Diagnosis not present

## 2015-06-13 DIAGNOSIS — R1011 Right upper quadrant pain: Secondary | ICD-10-CM | POA: Diagnosis not present

## 2015-06-13 DIAGNOSIS — R0902 Hypoxemia: Secondary | ICD-10-CM | POA: Diagnosis not present

## 2015-06-13 DIAGNOSIS — I10 Essential (primary) hypertension: Secondary | ICD-10-CM | POA: Insufficient documentation

## 2015-06-13 DIAGNOSIS — R918 Other nonspecific abnormal finding of lung field: Secondary | ICD-10-CM | POA: Diagnosis not present

## 2015-06-13 DIAGNOSIS — J181 Lobar pneumonia, unspecified organism: Secondary | ICD-10-CM

## 2015-06-13 LAB — COMPREHENSIVE METABOLIC PANEL
ALK PHOS: 116 U/L (ref 38–126)
ALT: 15 U/L (ref 14–54)
ANION GAP: 13 (ref 5–15)
AST: 23 U/L (ref 15–41)
Albumin: 4.2 g/dL (ref 3.5–5.0)
BUN: 17 mg/dL (ref 6–20)
CALCIUM: 9.5 mg/dL (ref 8.9–10.3)
CO2: 33 mmol/L — AB (ref 22–32)
Chloride: 92 mmol/L — ABNORMAL LOW (ref 101–111)
Creatinine, Ser: 1 mg/dL (ref 0.44–1.00)
GFR calc non Af Amer: 47 mL/min — ABNORMAL LOW (ref >60)
GFR, EST AFRICAN AMERICAN: 55 mL/min — AB (ref >60)
Glucose, Bld: 120 mg/dL — ABNORMAL HIGH (ref 65–99)
Potassium: 3.1 mmol/L — ABNORMAL LOW (ref 3.5–5.1)
SODIUM: 138 mmol/L (ref 135–145)
Total Bilirubin: 0.9 mg/dL (ref 0.3–1.2)
Total Protein: 8.1 g/dL (ref 6.5–8.1)

## 2015-06-13 LAB — CBC WITH DIFFERENTIAL/PLATELET
BASOS PCT: 0 %
Basophils Absolute: 0 10*3/uL (ref 0.0–0.1)
EOS ABS: 0 10*3/uL (ref 0.0–0.7)
Eosinophils Relative: 0 %
HCT: 47.2 % — ABNORMAL HIGH (ref 36.0–46.0)
Hemoglobin: 16.6 g/dL — ABNORMAL HIGH (ref 12.0–15.0)
LYMPHS ABS: 1.2 10*3/uL (ref 0.7–4.0)
Lymphocytes Relative: 6 %
MCH: 28 pg (ref 26.0–34.0)
MCHC: 35.2 g/dL (ref 30.0–36.0)
MCV: 79.7 fL (ref 78.0–100.0)
Monocytes Absolute: 2.1 10*3/uL — ABNORMAL HIGH (ref 0.1–1.0)
Monocytes Relative: 9 %
NEUTROS PCT: 85 %
Neutro Abs: 19 10*3/uL — ABNORMAL HIGH (ref 1.7–7.7)
PLATELETS: 271 10*3/uL (ref 150–400)
RBC: 5.92 MIL/uL — AB (ref 3.87–5.11)
RDW: 15.8 % — AB (ref 11.5–15.5)
WBC: 22.4 10*3/uL — AB (ref 4.0–10.5)

## 2015-06-13 LAB — LIPASE, BLOOD: Lipase: 25 U/L (ref 11–51)

## 2015-06-13 MED ORDER — ALLOPURINOL 100 MG PO TABS
100.0000 mg | ORAL_TABLET | Freq: Every day | ORAL | Status: DC
  Administered 2015-06-14 – 2015-06-16 (×3): 100 mg via ORAL
  Filled 2015-06-13 (×3): qty 1

## 2015-06-13 MED ORDER — TRAMADOL-ACETAMINOPHEN 37.5-325 MG PO TABS
1.0000 | ORAL_TABLET | Freq: Three times a day (TID) | ORAL | Status: DC | PRN
  Administered 2015-06-14 – 2015-06-16 (×7): 1 via ORAL
  Filled 2015-06-13 (×7): qty 1

## 2015-06-13 MED ORDER — DEXTROSE 5 % IV SOLN
1.0000 g | Freq: Once | INTRAVENOUS | Status: AC
  Administered 2015-06-13: 1 g via INTRAVENOUS
  Filled 2015-06-13: qty 10

## 2015-06-13 MED ORDER — POLYETHYLENE GLYCOL 3350 17 G PO PACK: 17.0000 g | PACK | Freq: Every day | ORAL | Status: DC | PRN

## 2015-06-13 MED ORDER — DIATRIZOATE MEGLUMINE & SODIUM 66-10 % PO SOLN
ORAL | Status: AC
Start: 2015-06-13 — End: 2015-06-14
  Filled 2015-06-13: qty 30

## 2015-06-13 MED ORDER — DEXTROSE 5 % IV SOLN
500.0000 mg | Freq: Once | INTRAVENOUS | Status: AC
  Administered 2015-06-13: 500 mg via INTRAVENOUS
  Filled 2015-06-13: qty 500

## 2015-06-13 MED ORDER — GABAPENTIN 300 MG PO CAPS
300.0000 mg | ORAL_CAPSULE | Freq: Three times a day (TID) | ORAL | Status: DC
  Administered 2015-06-13 – 2015-06-16 (×8): 300 mg via ORAL
  Filled 2015-06-13 (×8): qty 1

## 2015-06-13 MED ORDER — MORPHINE SULFATE (PF) 4 MG/ML IV SOLN
4.0000 mg | Freq: Once | INTRAVENOUS | Status: AC
  Administered 2015-06-13: 4 mg via INTRAVENOUS
  Filled 2015-06-13: qty 1

## 2015-06-13 MED ORDER — ONDANSETRON HCL 4 MG/2ML IJ SOLN: 4.0000 mg | Freq: Four times a day (QID) | INTRAMUSCULAR | Status: DC | PRN

## 2015-06-13 MED ORDER — SODIUM CHLORIDE 0.9 % IV SOLN: INTRAVENOUS | Status: DC

## 2015-06-13 MED ORDER — ONDANSETRON HCL 4 MG PO TABS: 4.0000 mg | ORAL_TABLET | Freq: Four times a day (QID) | ORAL | Status: DC | PRN

## 2015-06-13 MED ORDER — ACETAMINOPHEN 500 MG PO TABS
1000.0000 mg | ORAL_TABLET | Freq: Once | ORAL | Status: AC
  Administered 2015-06-13: 1000 mg via ORAL
  Filled 2015-06-13: qty 2

## 2015-06-13 MED ORDER — ONDANSETRON HCL 4 MG/2ML IJ SOLN
4.0000 mg | Freq: Once | INTRAMUSCULAR | Status: AC
  Administered 2015-06-13: 4 mg via INTRAVENOUS
  Filled 2015-06-13: qty 2

## 2015-06-13 MED ORDER — METOLAZONE 5 MG PO TABS: 2.5000 mg | ORAL_TABLET | Freq: Every day | ORAL | Status: DC | PRN

## 2015-06-13 MED ORDER — PANTOPRAZOLE SODIUM 40 MG IV SOLR
40.0000 mg | Freq: Once | INTRAVENOUS | Status: AC
  Administered 2015-06-13: 40 mg via INTRAVENOUS
  Filled 2015-06-13: qty 40

## 2015-06-13 MED ORDER — FENTANYL 50 MCG/HR TD PT72
50.0000 ug | MEDICATED_PATCH | TRANSDERMAL | Status: DC
  Administered 2015-06-16: 50 ug via TRANSDERMAL
  Filled 2015-06-13: qty 1

## 2015-06-13 MED ORDER — DEXTROSE 5 % IV SOLN
1.0000 g | INTRAVENOUS | Status: DC
  Administered 2015-06-14 – 2015-06-15 (×2): 1 g via INTRAVENOUS
  Filled 2015-06-13 (×3): qty 10

## 2015-06-13 MED ORDER — FUROSEMIDE 40 MG PO TABS
60.0000 mg | ORAL_TABLET | Freq: Two times a day (BID) | ORAL | Status: DC
  Administered 2015-06-14 – 2015-06-16 (×5): 60 mg via ORAL
  Filled 2015-06-13 (×5): qty 1

## 2015-06-13 MED ORDER — IOPAMIDOL (ISOVUE-300) INJECTION 61%
100.0000 mL | Freq: Once | INTRAVENOUS | Status: AC | PRN
  Administered 2015-06-13: 100 mL via INTRAVENOUS

## 2015-06-13 MED ORDER — MORPHINE SULFATE (PF) 2 MG/ML IV SOLN: 1.0000 mg | INTRAVENOUS | Status: DC | PRN

## 2015-06-13 MED ORDER — ENOXAPARIN SODIUM 30 MG/0.3ML ~~LOC~~ SOLN
30.0000 mg | SUBCUTANEOUS | Status: DC
  Administered 2015-06-13 – 2015-06-15 (×3): 30 mg via SUBCUTANEOUS
  Filled 2015-06-13 (×3): qty 0.3

## 2015-06-13 MED ORDER — POTASSIUM CHLORIDE CRYS ER 20 MEQ PO TBCR
40.0000 meq | EXTENDED_RELEASE_TABLET | ORAL | Status: AC
  Administered 2015-06-13 – 2015-06-14 (×2): 40 meq via ORAL
  Filled 2015-06-13 (×3): qty 2

## 2015-06-13 MED ORDER — POTASSIUM CHLORIDE CRYS ER 20 MEQ PO TBCR
40.0000 meq | EXTENDED_RELEASE_TABLET | Freq: Every day | ORAL | Status: DC
  Administered 2015-06-14 – 2015-06-16 (×3): 40 meq via ORAL
  Filled 2015-06-13 (×4): qty 2

## 2015-06-13 MED ORDER — DEXTROSE 5 % IV SOLN
500.0000 mg | INTRAVENOUS | Status: DC
  Administered 2015-06-14 – 2015-06-15 (×2): 500 mg via INTRAVENOUS
  Filled 2015-06-13 (×3): qty 500

## 2015-06-13 MED ORDER — FELODIPINE ER 5 MG PO TB24
10.0000 mg | ORAL_TABLET | Freq: Every day | ORAL | Status: DC
  Administered 2015-06-14 – 2015-06-16 (×3): 10 mg via ORAL
  Filled 2015-06-13: qty 1
  Filled 2015-06-13 (×2): qty 2
  Filled 2015-06-13: qty 1

## 2015-06-13 MED ORDER — SODIUM CHLORIDE 0.9 % IV BOLUS (SEPSIS)
500.0000 mL | Freq: Once | INTRAVENOUS | Status: AC
  Administered 2015-06-13: 500 mL via INTRAVENOUS

## 2015-06-13 NOTE — ED Notes (Signed)
MD at bedside. 

## 2015-06-13 NOTE — ED Provider Notes (Addendum)
CSN: [id number]     Arrival date & time 06/13/15  1428 History   First MD Initiated Contact with Patient 06/13/15 1440     Chief Complaint  Patient presents with  . Abdominal Pain     (Consider location/radiation/quality/duration/timing/severity/associated sxs/prior Treatment) HPI Comments: Patient is a 82 year old female with history of cholecystectomy with recurrent gallstones post surgically requiring ERCP and placement of a common bile duct stent. She presents today for complaints of epigastric and right upper quadrant pain that has been worsening since last night. This feels similar to her prior gallstone issues. She denies any fevers or chills. She denies any diarrhea.  Patient is a 82 y.o. female presenting with abdominal pain. The history is provided by the patient and a relative.  Abdominal Pain Pain location:  Epigastric and RUQ Pain quality: stabbing   Pain radiates to:  Does not radiate Pain severity:  Moderate Onset quality:  Sudden Duration:  1 day Timing:  Constant Progression:  Worsening Chronicity:  Recurrent Relieved by:  Nothing Worsened by:  Nothing tried Ineffective treatments:  None tried Associated symptoms: vomiting   Associated symptoms: no chills, no constipation, no dysuria and no fever     Past Medical History  Diagnosis Date  . Gout   . Osteoarthritis   . Leg cramps   . Choledocholithiasis 3 2014 and 5 2014, Dec 2014    S/P ERCP and sphincterotomy x3  . Hypertension    Past Surgical History  Procedure Laterality Date  . Cholecystectomy    . Appendectomy    . Cataract extraction Bilateral   . Wrist arthroplasty Right   . Right femur      metal plate  . Breast biopsy Left   . Ercp  05/23/2012    RMR: juxta-ampullary duodenal diverticulum, markedly dilated biliary tree with choledocholithiasis, s/p sphincterotomy with biliary sphincterotomy balloon dilation, balloon and basket stone extraction, stent placement  . Removal of stones  05/23/2012     Procedure: BALLOON AND BASKET REMOVAL OF STONES;  Surgeon: Corbin Adeobert M Rourk, MD;  Location: [facility];  Service: Endoscopy;;  . Sphincterotomy  05/23/2012    Procedure: Dennison MascotSPHINCTEROTOMY;  Surgeon: Corbin Adeobert M Rourk, MD;  Location: [facility];  Service: Endoscopy;;  . Biliary stent placement  05/23/2012    Procedure: BILIARY STENT PLACEMENT;  Surgeon: Corbin Adeobert M Rourk, MD;  Location: [facility];  Service: Endoscopy;;  . Balloon dilation  05/23/2012    Procedure: AMPULLARY BALLOON DILATION;  Surgeon: Corbin Adeobert M Rourk, MD;  Location: [facility];  Service: Endoscopy;;  . Ercp N/A 07/24/2012    NWG:[id number]:Juxta-ampullary duodenal diverticulum/s/p dilation sphincterotomy site s/p basket stone extraction s/p cholangioscopy with holmium lithotripsy of common duct stones  . Spyglass cholangioscopy N/A 07/24/2012    Procedure: NGEXBMWUSPYGLASS CHOLANGIOSCOPY;  Surgeon: Corbin Adeobert M Rourk, MD;  Location: [facility];  Service: Endoscopy;  Laterality: N/A;  . Spyglass lithotripsy N/A 07/24/2012    Procedure: XLKGMWNUSPYGLASS LITHOTRIPSY;  Surgeon: Corbin Adeobert M Rourk, MD;  Location: [facility];  Service: Endoscopy;  Laterality: N/A;  . Removal of stones N/A 07/24/2012    Procedure: REMOVAL OF STONES;  Surgeon: Corbin Adeobert M Rourk, MD;  Location: [facility];  Service: Endoscopy;  Laterality: N/A;  . Balloon dilation N/A 07/24/2012    Procedure: BALLOON DILATION; Balloon basket dredge and balloon dialate sphincterotomy;  Surgeon: Corbin Adeobert M Rourk, MD;  Location: [facility];  Service: Endoscopy;  Laterality: N/A;  . Holmium laser application N/A 07/24/2012    Procedure: HOLMIUM LASER APPLICATION;  Surgeon: Gerrit Friendsobert M  Rourk, MD;  Location: [facility];  Service: Endoscopy;  Laterality: N/A;  . Ercp N/A 02/17/2013    Dr. Jena Gauss: markedly dilated biliary tree with choledocholithiasis, s/p sphincterotomy and extraction  . Stone extraction with basket N/A 02/17/2013    Procedure: STONE EXTRACTION WITH BASKET and BALLOON;  Surgeon: Corbin Ade, MD;  Location: [facility];  Service: Endoscopy;  Laterality: N/A;   . Balloon dilation N/A 02/17/2013    Procedure: BALLOON DILATION;  Surgeon: Corbin Ade, MD;  Location: [facility];  Service: Endoscopy;  Laterality: N/A;  . Carpal tunnel release Right   . Choledochoenterostomy N/A 11/09/2014    Procedure: CHOLEDOCHODUODENOSTOMY;  Surgeon: Franky Macho, MD;  Location: [facility];  Service: General;  Laterality: N/A;   Family History  Problem Relation Age of Onset  . Stroke Father   . CAD Mother   . Cancer Son     ?metastatic  . Colon cancer Neg Hx    Social History  Substance Use Topics  . Smoking status: Former Smoker -- 0.25 packs/day for 15 years    Types: Cigarettes    Quit date: 07/17/1988  . Smokeless tobacco: Never Used  . Alcohol Use: No   OB History    Gravida Para Term Preterm AB TAB SAB Ectopic Multiple Living   0     Review of Systems  Constitutional: Negative for fever and chills.  Gastrointestinal: Positive for vomiting and abdominal pain. Negative for constipation.  Genitourinary: Negative for dysuria.      Allergies  Review of patient's allergies indicates no known allergies.  Home Medications   Prior to Admission medications   Medication Sig Start Date End Date Taking? Authorizing Provider  allopurinol (ZYLOPRIM) 100 MG tablet Take 100 mg by mouth daily.   Yes Historical Provider, MD  felodipine (PLENDIL) 10 MG 24 hr tablet Take 10 mg by mouth daily.   Yes Historical Provider, MD  fentaNYL (DURAGESIC - DOSED MCG/HR) 50 MCG/HR Place 50 mcg onto the skin every 3 (three) days.   Yes Historical Provider, MD  furosemide (LASIX) 40 MG tablet Take 60 mg by mouth 2 (two) times daily.   Yes Historical Provider, MD  gabapentin (NEURONTIN) 100 MG capsule TAKE 3 CAPSULES BY MOUTH THREE TIMES DAILY. 11/18/14  Yes Anson Fret, MD  metolazone (ZAROXOLYN) 2.5 MG tablet Take 2.5 mg by mouth daily as needed (for fluid).  08/10/13  Yes Historical Provider, MD  polyethylene glycol (MIRALAX / GLYCOLAX) packet Take 17 g by mouth  daily as needed for moderate constipation.   Yes Historical Provider, MD  potassium chloride SA (K-DUR,KLOR-CON) 20 MEQ tablet Take 40 mEq by mouth daily.    Yes Historical Provider, MD  traMADol-acetaminophen (ULTRACET) 37.5-325 MG per tablet Take 1 tablet by mouth 3 (three) times daily as needed for pain.   Yes Historical Provider, MD   BP 130/57 mmHg  Pulse 85  Temp(Src) 100.2 F (37.9 C) (Oral)  Resp 16  Ht 5' (1.524 m)  Wt 123 lb (55.792 kg)  BMI 24.02 kg/m2  SpO2 97% Physical Exam  Constitutional: She is oriented to person, place, and time. She appears well-developed and well-nourished. No distress.  HENT:  Head: Normocephalic and atraumatic.  Neck: Normal range of motion. Neck supple.  Cardiovascular: Normal rate and regular rhythm.  Exam reveals no gallop and no friction rub.   No murmur heard. Pulmonary/Chest: Effort normal and breath sounds normal. No respiratory distress. She  has no wheezes.  Abdominal: Soft. Bowel sounds are normal. She exhibits no distension. There is tenderness. There is no rebound and no guarding.  There is tenderness to palpation in the right upper quadrant.  Musculoskeletal: Normal range of motion.  Neurological: She is alert and oriented to person, place, and time.  Skin: Skin is warm and dry. She is not diaphoretic.  Nursing note and vitals reviewed.   ED Course  Procedures (including critical care time) Labs Review Labs Reviewed - No data to display  Imaging Review No results found. I have personally reviewed and evaluated these images and lab results as part of my medical decision-making.   MDM   Final diagnoses:  None   Patient presents with epigastric and right upper quadrant pain. She has a history of gallstones, cholecystectomy, and retained gallstones requiring ERCP and biliary stent. Her workup today reveals a white count of 22,000, fever of 100.2, and CT scan that does not show any intra-abdominal acute pathology, but does show  a right lower lobe pneumonia. Due to her advanced age, fever, high white count, I feel as though admission for IV antibiotics as indicated. She will be given Rocephin and Zithromax and will be admitted to the hospitalist service under the care of Dr. Sharl Ma.  I have also discussed the case with Dr. Jena Gauss from gastroenterology who does not feel as though there is any GI pathology at this time morning.    Geoffery Lyons, MD 06/14/15 1191  Geoffery Lyons, MD 06/23/15 [phone]

## 2015-06-13 NOTE — H&P (Signed)
PCP:   FANTA,TESFAYE, MD   Chief Complaint:  Abdominal pain  HPI:  82 year old female who  has a past medical history of Gout; Osteoarthritis; Leg cramps; Choledocholithiasis (3 2014 and 5 2014, Dec 2014); and Hypertension. today presents to the hospital with chief complaint of abdominal pain. Patient has history of cholecystectomy with recurrent gallstones requiring he has CP and placement of CBD stent. Patient has been complaining of right upper quadrant pain along with nausea and vomiting. In the ED liver enzymes are within normal limits. CT abdomen and pelvis showed right middle lobe pneumonia and patient started on IV antibiotics. Patient was found to be hypoxic requiring oxygen in the ED. She has a history of diastolic heart failure with pulmonary hypertension. Denies chest pain. Was found to have fever of 101.1 in the ED. Denies any chills. No dysuria. Denies diarrhea.  Allergies:  No Known Allergies    Past Medical History  Diagnosis Date  . Gout   . Osteoarthritis   . Leg cramps   . Choledocholithiasis 3 2014 and 5 2014, Dec 2014    S/P ERCP and sphincterotomy x3  . Hypertension     Past Surgical History  Procedure Laterality Date  . Cholecystectomy    . Appendectomy    . Cataract extraction Bilateral   . Wrist arthroplasty Right   . Right femur      metal plate  . Breast biopsy Left   . Ercp  05/23/2012    RMR: juxta-ampullary duodenal diverticulum, markedly dilated biliary tree with choledocholithiasis, s/p sphincterotomy with biliary sphincterotomy balloon dilation, balloon and basket stone extraction, stent placement  . Removal of stones  05/23/2012    Procedure: BALLOON AND BASKET REMOVAL OF STONES;  Surgeon: Corbin Ade, MD;  Location: [facility];  Service: Endoscopy;;  . Sphincterotomy  05/23/2012    Procedure: Dennison Mascot;  Surgeon: Corbin Ade, MD;  Location: [facility];  Service: Endoscopy;;  . Biliary stent placement  05/23/2012    Procedure: BILIARY  STENT PLACEMENT;  Surgeon: Corbin Ade, MD;  Location: [facility];  Service: Endoscopy;;  . Balloon dilation  05/23/2012    Procedure: AMPULLARY BALLOON DILATION;  Surgeon: Corbin Ade, MD;  Location: [facility];  Service: Endoscopy;;  . Ercp N/A 07/24/2012    [id number] duodenal diverticulum/s/p dilation sphincterotomy site s/p basket stone extraction s/p cholangioscopy with holmium lithotripsy of common duct stones  . Spyglass cholangioscopy N/A 07/24/2012    Procedure: YNWGNFAO CHOLANGIOSCOPY;  Surgeon: Corbin Ade, MD;  Location: [facility];  Service: Endoscopy;  Laterality: N/A;  . Spyglass lithotripsy N/A 07/24/2012    Procedure: ZHYQMVHQ LITHOTRIPSY;  Surgeon: Corbin Ade, MD;  Location: [facility];  Service: Endoscopy;  Laterality: N/A;  . Removal of stones N/A 07/24/2012    Procedure: REMOVAL OF STONES;  Surgeon: Corbin Ade, MD;  Location: [facility];  Service: Endoscopy;  Laterality: N/A;  . Balloon dilation N/A 07/24/2012    Procedure: BALLOON DILATION; Balloon basket dredge and balloon dialate sphincterotomy;  Surgeon: Corbin Ade, MD;  Location: [facility];  Service: Endoscopy;  Laterality: N/A;  . Holmium laser application N/A 07/24/2012    Procedure: HOLMIUM LASER APPLICATION;  Surgeon: Corbin Ade, MD;  Location: [facility];  Service: Endoscopy;  Laterality: N/A;  . Ercp N/A 02/17/2013    Dr. Jena Gauss: markedly dilated biliary tree with choledocholithiasis, s/p sphincterotomy and extraction  . Stone extraction with basket N/A 02/17/2013    Procedure: STONE EXTRACTION WITH BASKET  and BALLOON;  Surgeon: Corbin Ade, MD;  Location: [facility];  Service: Endoscopy;  Laterality: N/A;  . Balloon dilation N/A 02/17/2013    Procedure: BALLOON DILATION;  Surgeon: Corbin Ade, MD;  Location: [facility];  Service: Endoscopy;  Laterality: N/A;  . Carpal tunnel release Right   . Choledochoenterostomy N/A 11/09/2014    Procedure: CHOLEDOCHODUODENOSTOMY;  Surgeon: Franky Macho, MD;  Location: [facility];   Service: General;  Laterality: N/A;    Prior to Admission medications   Medication Sig Start Date End Date Taking? Authorizing Provider  allopurinol (ZYLOPRIM) 100 MG tablet Take 100 mg by mouth daily.   Yes Historical Provider, MD  felodipine (PLENDIL) 10 MG 24 hr tablet Take 10 mg by mouth daily.   Yes Historical Provider, MD  fentaNYL (DURAGESIC - DOSED MCG/HR) 50 MCG/HR Place 50 mcg onto the skin every 3 (three) days.   Yes Historical Provider, MD  furosemide (LASIX) 40 MG tablet Take 60 mg by mouth 2 (two) times daily.   Yes Historical Provider, MD  gabapentin (NEURONTIN) 100 MG capsule TAKE 3 CAPSULES BY MOUTH THREE TIMES DAILY. 11/18/14  Yes Anson Fret, MD  metolazone (ZAROXOLYN) 2.5 MG tablet Take 2.5 mg by mouth daily as needed (for fluid).  08/10/13  Yes Historical Provider, MD  polyethylene glycol (MIRALAX / GLYCOLAX) packet Take 17 g by mouth daily as needed for moderate constipation.   Yes Historical Provider, MD  potassium chloride SA (K-DUR,KLOR-CON) 20 MEQ tablet Take 40 mEq by mouth daily.    Yes Historical Provider, MD  traMADol-acetaminophen (ULTRACET) 37.5-325 MG per tablet Take 1 tablet by mouth 3 (three) times daily as needed for pain.   Yes Historical Provider, MD    Social History:  reports that she quit smoking about 26 years ago. Her smoking use included Cigarettes. She has a 3.75 pack-year smoking history. She has never used smokeless tobacco. She reports that she does not drink alcohol or use illicit drugs.  Family History  Problem Relation Age of Onset  . Stroke Father   . CAD Mother   . Cancer Son     ?metastatic  . Colon cancer Neg Hx     Filed Weights   06/13/15 1433  Weight: 55.792 kg (123 lb)    All the positives are listed in BOLD  Review of Systems:  HEENT: Headache, blurred vision, runny nose, sore throat Neck: Hypothyroidism, hyperthyroidism,,lymphadenopathy Chest : Shortness of breath, history of COPD, Asthma Heart : Chest pain, history  of coronary arterey disease GI:  Nausea, vomiting, diarrhea, constipation, GERD GU: Dysuria, urgency, frequency of urination, hematuria Neuro: Stroke, seizures, syncope Psych: Depression, anxiety, hallucinations   Physical Exam: Blood pressure 104/57, pulse 85, temperature 99.4 F (37.4 C), temperature source Oral, resp. rate 15, height 5' (1.524 m), weight 55.792 kg (123 lb), SpO2 95 %. Constitutional:   Patient is a well-developed and well-nourished female in no acute distress and cooperative with exam. Head: Normocephalic and atraumatic Mouth: Mucus membranes moist Eyes: PERRL, EOMI, conjunctivae normal Neck: Supple, No Thyromegaly Cardiovascular: RRR, S1 normal, S2 normal Pulmonary/Chest: CTAB, no wheezes, rales, or rhonchi Abdominal: Soft.  positive right upper quadrant tenderness to palpation, non-distended, bowel sounds are normal, no masses, organomegaly, or guarding present.  Neurological: A&O x3, Strength is normal and symmetric bilaterally, cranial nerve II-XII are grossly intact, no focal motor deficit, sensory intact to light touch bilaterally.  Extremities : No Cyanosis, Clubbing or Edema  Labs on Admission:  Basic Metabolic Panel:  Recent Labs Lab 06/13/15 1520  NA 138  K 3.1*  CL 92*  CO2 33*  GLUCOSE 120*  BUN 17  CREATININE 1.00  CALCIUM 9.5   Liver Function Tests:  Recent Labs Lab 06/13/15 1520  AST 23  ALT 15  ALKPHOS 116  BILITOT 0.9  PROT 8.1  ALBUMIN 4.2    Recent Labs Lab 06/13/15 1520  LIPASE 25   CBC:  Recent Labs Lab 06/13/15 1520  WBC 22.4*  NEUTROABS 19.0*  HGB 16.6*  HCT 47.2*  MCV 79.7  PLT 271    Radiological Exams on Admission: Ct Abdomen Pelvis W Contrast  06/13/2015  CLINICAL DATA:  Abdominal pain, primarily epigastric and right upper quadrant. EXAM: CT ABDOMEN AND PELVIS WITH CONTRAST TECHNIQUE: Multidetector CT imaging of the abdomen and pelvis was performed using the standard protocol following bolus  administration of intravenous contrast. Oral contrast also administered. CONTRAST:  100mL ISOVUE-300 IOPAMIDOL (ISOVUE-300) INJECTION 61% COMPARISON:  CT abdomen and pelvis November 01, 2014; abdominal MRI November 02, 2014 FINDINGS: Lower chest: There is scarring in the lung bases. There is questionable early infiltrate versus atelectasis in the right middle lobe inferiorly. There is atherosclerotic calcification with peripheral thrombus in the distal thoracic aorta. There is a small hiatal hernia. Hepatobiliary: There is extensive air in the biliary ductal system consistent with previous sphincterotomy. No focal liver lesions are identified. Gallbladder is absent. There is no appreciable biliary duct dilatation. Pancreas: Pancreas appears mildly atrophic. There is chronic pancreatic duct dilatation. No pancreatic mass or inflammatory focus is evident by CT. Spleen: No splenic lesions are appreciable. Adrenals/Urinary Tract: Adrenals show mild hypertrophy, stable. No focal adrenal lesions are evident. There is no hydronephrosis or renal mass on either side. There are areas of scarring in the right kidney, stable. No renal or ureteral calculus is evident. The urinary bladder is midline with wall thickness within normal limits. Stomach/Bowel: Rectum is mildly distended with stool and air. There is no bowel wall or mesenteric thickening. No bowel obstruction. No free air or portal venous air. There is moderate stool throughout the colon. No bowel pneumatosis. Vascular/Lymphatic: There is extensive atherosclerotic calcification throughout the aorta and iliac arteries without frank aneurysm. There is moderate calcification at the origins of the mesenteric vessels. Note that the aorta is tortuous. Reproductive:  No pelvic mass or pelvic fluid collection. Other: There is no appreciable appendiceal inflammation. No abscess or ascites in the abdomen or pelvis. There is a small ventral hernia containing only fat.  Musculoskeletal: There is marked lumbar levoscoliosis. There is extensive osteoarthritic change throughout the lumbar spine. There is a total hip prosthesis on the right. There is moderate osteoarthritic change in the left hip joint. There are no blastic or lytic bone lesions. No intramuscular or abdominal wall lesions. IMPRESSION: Question early pneumonia right middle lobe. Extensive air in the biliary ductal system consistent with prior sphincterotomy. No focal liver lesions evident. Gallbladder absent. Chronic pancreatic duct dilatation.  No pancreatic mass. Small hiatal hernia. Extensive atherosclerotic calcification. Advanced arthropathy in the lower thoracic and lumbar spine. No renal or ureteral calculus. No hydronephrosis. No bowel obstruction. No abscess. Small ventral hernia containing only fat. Electronically Signed   By: Bretta BangWilliam  Woodruff III M.D.   On: 06/13/2015 18:13     Assessment/Plan Active Problems:   HYPOKALEMIA   Hypoxemia   RUQ pain   CAP (community acquired pneumonia)   Community-acquired pneumonia Patient presented with right upper quadrant pain was found to have right middle lobe  pneumonia. She has white count 22,000. Will start ceftriaxone and Zithromax. Obtain urinary strep pneumo antigen. Will check chest x-ray to confirm the pneumonia. Obtain blood cultures 2.  Hypokalemia Replace potassium and check BMP in a.m.  Chronic diastolic heart failure Patient has history of chronic diastolic heart failure, pulmonary hypertension. Continue Lasix 60 mg by mouth twice a day. Metolazone Patient may require home oxygen.  Right upper quadrant pain ? Cause, secondary to pneumonia Liver enzymes are within normal limits. We'll continue with liquid diet. Check liver enzymes in a.m.  DVT prophylaxis Lovenox  Code status: DO NOT RESUSCITATE  Family discussion: Admission, patients condition and plan of care including tests being ordered have been discussed with the  patient and her daughters at bedside who indicate understanding and agree with the plan and Code Status.   Time Spent on Admission: 60 min  Devyne Hauger S [facility] Pager: [phone] 06/13/2015, 7:38 PM  If 7PM-7AM, please contact night-coverage  [email or web address]  Password [email or web address]

## 2015-06-13 NOTE — ED Notes (Signed)
Pt c/o RUQ pain that started about 0300 this morning. Pt has hx of gallstones with ERCP when she is experiencing this type of pain. Pt reports 1 episode of vomiting this morning. Denies diarrhea, fever.

## 2015-06-13 NOTE — Telephone Encounter (Signed)
Pt niece called and states that the pt is having abdominal pain and her pain is a 10/10. Advised pt niece to take pt to the ER for evaluation. Nothing can be done in office if pain is on that level.

## 2015-06-13 NOTE — ED Notes (Signed)
Attempted to call report.  Nurse unavailable. 

## 2015-06-13 NOTE — Telephone Encounter (Signed)
Agree 

## 2015-06-13 NOTE — ED Notes (Signed)
Admitting doctor at bedside 

## 2015-06-14 DIAGNOSIS — R1011 Right upper quadrant pain: Secondary | ICD-10-CM

## 2015-06-14 DIAGNOSIS — R109 Unspecified abdominal pain: Secondary | ICD-10-CM | POA: Diagnosis not present

## 2015-06-14 DIAGNOSIS — J189 Pneumonia, unspecified organism: Secondary | ICD-10-CM

## 2015-06-14 LAB — CBC
HCT: 39.9 % (ref 36.0–46.0)
Hemoglobin: 13.9 g/dL (ref 12.0–15.0)
MCH: 28 pg (ref 26.0–34.0)
MCHC: 34.8 g/dL (ref 30.0–36.0)
MCV: 80.3 fL (ref 78.0–100.0)
Platelets: 236 10*3/uL (ref 150–400)
RBC: 4.97 MIL/uL (ref 3.87–5.11)
RDW: 16 % — ABNORMAL HIGH (ref 11.5–15.5)
WBC: 15 10*3/uL — ABNORMAL HIGH (ref 4.0–10.5)

## 2015-06-14 LAB — COMPREHENSIVE METABOLIC PANEL
ALT: 12 U/L — ABNORMAL LOW (ref 14–54)
AST: 19 U/L (ref 15–41)
Albumin: 3.2 g/dL — ABNORMAL LOW (ref 3.5–5.0)
Alkaline Phosphatase: 89 U/L (ref 38–126)
Anion gap: 7 (ref 5–15)
BUN: 14 mg/dL (ref 6–20)
CO2: 36 mmol/L — ABNORMAL HIGH (ref 22–32)
Calcium: 8.9 mg/dL (ref 8.9–10.3)
Chloride: 96 mmol/L — ABNORMAL LOW (ref 101–111)
Creatinine, Ser: 1.08 mg/dL — ABNORMAL HIGH (ref 0.44–1.00)
GFR calc Af Amer: 50 mL/min — ABNORMAL LOW (ref >60)
GFR calc non Af Amer: 43 mL/min — ABNORMAL LOW (ref >60)
Glucose, Bld: 96 mg/dL (ref 65–99)
Potassium: 4.1 mmol/L (ref 3.5–5.1)
Sodium: 139 mmol/L (ref 135–145)
Total Bilirubin: 0.7 mg/dL (ref 0.3–1.2)
Total Protein: 6.5 g/dL (ref 6.5–8.1)

## 2015-06-14 MED ORDER — PANTOPRAZOLE SODIUM 40 MG PO TBEC
40.0000 mg | DELAYED_RELEASE_TABLET | Freq: Every day | ORAL | Status: DC
  Administered 2015-06-14 – 2015-06-16 (×3): 40 mg via ORAL
  Filled 2015-06-14 (×3): qty 1

## 2015-06-14 NOTE — Care Management Obs Status (Signed)
MEDICARE OBSERVATION STATUS NOTIFICATION   Patient Details  Name: Lori Bishop MRN: [id number] Date of Birth: [birth year]   Medicare Observation Status Notification Given:  Yes    Adonis HugueninBerkhead, Jerek Meulemans L, RN 06/14/2015, 8:56 AM

## 2015-06-14 NOTE — Progress Notes (Signed)
Pharmacy Antibiotic Note  Lori Bishop is a 82 y.o. female admitted on 06/13/2015 with pneumonia.  Pharmacy has been consulted for ROCEPHIN dosing.  Plan: Rocephin 1gm IV q24hrs Monitor labs, progress and cultures  Height: 5' (152.4 cm) Weight: 123 lb (55.792 kg) IBW/kg (Calculated) : 45.5  Temp (24hrs), Avg:99.3 F (37.4 C), Min:97.7 F (36.5 C), Max:101.1 F (38.4 C)   Recent Labs Lab 06/13/15 1520 06/14/15 0533  WBC 22.4* 15.0*  CREATININE 1.00 1.08*    Estimated Creatinine Clearance: 26 mL/min (by C-G formula based on Cr of 1.08).    No Known Allergies  Anti-infectives    Start     Dose/Rate Route Frequency Ordered Stop   06/14/15 2100  azithromycin (ZITHROMAX) 500 mg in dextrose 5 % 250 mL IVPB     500 mg 250 mL/hr over 60 Minutes Intravenous Every 24 hours 06/13/15 2108     06/14/15 1800  cefTRIAXone (ROCEPHIN) 1 g in dextrose 5 % 50 mL IVPB     1 g 100 mL/hr over 30 Minutes Intravenous Every 24 hours 06/13/15 2149     06/13/15 1915  cefTRIAXone (ROCEPHIN) 1 g in dextrose 5 % 50 mL IVPB     1 g 100 mL/hr over 30 Minutes Intravenous  Once 06/13/15 1905 06/13/15 2016   06/13/15 1915  azithromycin (ZITHROMAX) 500 mg in dextrose 5 % 250 mL IVPB     500 mg 250 mL/hr over 60 Minutes Intravenous  Once 06/13/15 1905 06/13/15 2116     Results for orders placed or performed during the hospital encounter of 06/13/15  Culture, blood (Routine X 2) w Reflex to ID Panel     Status: None (Preliminary result)   Collection Time: 06/13/15  8:05 PM  Result Value Ref Range Status   Specimen Description BLOOD LEFT ARM  Final   Special Requests BOTTLES DRAWN AEROBIC AND ANAEROBIC 6CC  Final   Culture PENDING  Incomplete   Report Status PENDING  Incomplete  Culture, blood (Routine X 2) w Reflex to ID Panel     Status: None (Preliminary result)   Collection Time: 06/13/15  8:06 PM  Result Value Ref Range Status   Specimen Description BLOOD RIGHT HAND  Final   Special Requests  BOTTLES DRAWN AEROBIC AND ANAEROBIC 6CC  Final   Culture PENDING  Incomplete   Report Status PENDING  Incomplete   Thank you for allowing pharmacy to be a part of this patient's care.  Valrie HartHall, Taray Normoyle A 06/14/2015 8:51 AM

## 2015-06-14 NOTE — Progress Notes (Signed)
Subjective: Patient was admitted yesterday due to pneumonia. She has right side pleuritic chest pain and fever. Patient feels better today. No cough or shortness of breath.  Objective: Vital signs in last 24 hours: Temp:  [97.7 F (36.5 C)-101.1 F (38.4 C)] 97.7 F (36.5 C) (04/11 0452) Pulse Rate:  [64-97] 64 (04/11 0452) Resp:  [15-20] 20 (04/11 0452) BP: (85-144)/(42-58) 144/58 mmHg (04/11 0452) SpO2:  [91 %-100 %] 99 % (04/11 0452) Weight:  [55.792 kg (123 lb)] 55.792 kg (123 lb) (04/10 2139) Weight change:  Last BM Date: 06/13/15  Intake/Output from previous day:    PHYSICAL EXAM General appearance: alert, cooperative and no distress Resp: diminished breath sounds bilaterally and rhonchi bilaterally Cardio: S1, S2 normal GI: soft, non-tender; bowel sounds normal; no masses,  no organomegaly Extremities: extremities normal, atraumatic, no cyanosis or edema  Lab Results:  Results for orders placed or performed during the hospital encounter of 06/13/15 (from the past 48 hour(s))  Comprehensive metabolic panel     Status: Abnormal   Collection Time: 06/13/15  3:20 PM  Result Value Ref Range   Sodium 138 135 - 145 mmol/L   Potassium 3.1 (L) 3.5 - 5.1 mmol/L   Chloride 92 (L) 101 - 111 mmol/L   CO2 33 (H) 22 - 32 mmol/L   Glucose, Bld 120 (H) 65 - 99 mg/dL   BUN 17 6 - 20 mg/dL   Creatinine, Ser 1.00 0.44 - 1.00 mg/dL   Calcium 9.5 8.9 - 10.3 mg/dL   Total Protein 8.1 6.5 - 8.1 g/dL   Albumin 4.2 3.5 - 5.0 g/dL   AST 23 15 - 41 U/L   ALT 15 14 - 54 U/L   Alkaline Phosphatase 116 38 - 126 U/L   Total Bilirubin 0.9 0.3 - 1.2 mg/dL   GFR calc non Af Amer 47 (L) >60 mL/min   GFR calc Af Amer 55 (L) >60 mL/min    Comment: (NOTE) The eGFR has been calculated using the CKD EPI equation. This calculation has not been validated in all clinical situations. eGFR's persistently <60 mL/min signify possible Chronic Kidney Disease.    Anion gap 13 5 - 15  Lipase, blood      Status: None   Collection Time: 06/13/15  3:20 PM  Result Value Ref Range   Lipase 25 11 - 51 U/L  CBC with Differential     Status: Abnormal   Collection Time: 06/13/15  3:20 PM  Result Value Ref Range   WBC 22.4 (H) 4.0 - 10.5 K/uL   RBC 5.92 (H) 3.87 - 5.11 MIL/uL   Hemoglobin 16.6 (H) 12.0 - 15.0 g/dL   HCT 47.2 (H) 36.0 - 46.0 %   MCV 79.7 78.0 - 100.0 fL   MCH 28.0 26.0 - 34.0 pg   MCHC 35.2 30.0 - 36.0 g/dL   RDW 15.8 (H) 11.5 - 15.5 %   Platelets 271 150 - 400 K/uL   Neutrophils Relative % 85 %   Neutro Abs 19.0 (H) 1.7 - 7.7 K/uL   Lymphocytes Relative 6 %   Lymphs Abs 1.2 0.7 - 4.0 K/uL   Monocytes Relative 9 %   Monocytes Absolute 2.1 (H) 0.1 - 1.0 K/uL   Eosinophils Relative 0 %   Eosinophils Absolute 0.0 0.0 - 0.7 K/uL   Basophils Relative 0 %   Basophils Absolute 0.0 0.0 - 0.1 K/uL  Culture, blood (Routine X 2) w Reflex to ID Panel     Status: None (  Preliminary result)   Collection Time: 06/13/15  8:05 PM  Result Value Ref Range   Specimen Description BLOOD LEFT ARM    Special Requests BOTTLES DRAWN AEROBIC AND ANAEROBIC 6CC    Culture PENDING    Report Status PENDING   Culture, blood (Routine X 2) w Reflex to ID Panel     Status: None (Preliminary result)   Collection Time: 06/13/15  8:06 PM  Result Value Ref Range   Specimen Description BLOOD RIGHT HAND    Special Requests BOTTLES DRAWN AEROBIC AND ANAEROBIC 6CC    Culture PENDING    Report Status PENDING   CBC     Status: Abnormal   Collection Time: 06/14/15  5:33 AM  Result Value Ref Range   WBC 15.0 (H) 4.0 - 10.5 K/uL   RBC 4.97 3.87 - 5.11 MIL/uL   Hemoglobin 13.9 12.0 - 15.0 g/dL   HCT 39.9 36.0 - 46.0 %   MCV 80.3 78.0 - 100.0 fL   MCH 28.0 26.0 - 34.0 pg   MCHC 34.8 30.0 - 36.0 g/dL   RDW 16.0 (H) 11.5 - 15.5 %   Platelets 236 150 - 400 K/uL  Comprehensive metabolic panel     Status: Abnormal   Collection Time: 06/14/15  5:33 AM  Result Value Ref Range   Sodium 139 135 - 145 mmol/L    Potassium 4.1 3.5 - 5.1 mmol/L    Comment: DELTA CHECK NOTED   Chloride 96 (L) 101 - 111 mmol/L   CO2 36 (H) 22 - 32 mmol/L   Glucose, Bld 96 65 - 99 mg/dL   BUN 14 6 - 20 mg/dL   Creatinine, Ser 1.08 (H) 0.44 - 1.00 mg/dL   Calcium 8.9 8.9 - 10.3 mg/dL   Total Protein 6.5 6.5 - 8.1 g/dL   Albumin 3.2 (L) 3.5 - 5.0 g/dL   AST 19 15 - 41 U/L   ALT 12 (L) 14 - 54 U/L   Alkaline Phosphatase 89 38 - 126 U/L   Total Bilirubin 0.7 0.3 - 1.2 mg/dL   GFR calc non Af Amer 43 (L) >60 mL/min   GFR calc Af Amer 50 (L) >60 mL/min    Comment: (NOTE) The eGFR has been calculated using the CKD EPI equation. This calculation has not been validated in all clinical situations. eGFR's persistently <60 mL/min signify possible Chronic Kidney Disease.    Anion gap 7 5 - 15    ABGS No results for input(s): PHART, PO2ART, TCO2, HCO3 in the last 72 hours.  Invalid input(s): PCO2 CULTURES Recent Results (from the past 240 hour(s))  Culture, blood (Routine X 2) w Reflex to ID Panel     Status: None (Preliminary result)   Collection Time: 06/13/15  8:05 PM  Result Value Ref Range Status   Specimen Description BLOOD LEFT ARM  Final   Special Requests BOTTLES DRAWN AEROBIC AND ANAEROBIC 6CC  Final   Culture PENDING  Incomplete   Report Status PENDING  Incomplete  Culture, blood (Routine X 2) w Reflex to ID Panel     Status: None (Preliminary result)   Collection Time: 06/13/15  8:06 PM  Result Value Ref Range Status   Specimen Description BLOOD RIGHT HAND  Final   Special Requests BOTTLES DRAWN AEROBIC AND ANAEROBIC [facility]  Final   Culture PENDING  Incomplete   Report Status PENDING  Incomplete   Studies/Results: Dg Chest 2 View  06/13/2015  CLINICAL DATA:  Acute onset of right upper  quadrant abdominal pain and vomiting. Initial encounter. EXAM: CHEST  2 VIEW COMPARISON:  Chest radiograph from 11/12/2014 FINDINGS: The lungs are well-aerated. Mild right basilar opacity could reflect mild pneumonia,  depending on the patient's symptoms. There is no evidence of pleural effusion or pneumothorax. The heart is normal in size; the mediastinal contour is within normal limits. No acute osseous abnormalities are seen. IMPRESSION: Mild right basilar opacity could reflect mild pneumonia, depending on the patient's symptoms. Electronically Signed   By: Garald Balding M.D.   On: 06/13/2015 20:10   Ct Abdomen Pelvis W Contrast  06/13/2015  CLINICAL DATA:  Abdominal pain, primarily epigastric and right upper quadrant. EXAM: CT ABDOMEN AND PELVIS WITH CONTRAST TECHNIQUE: Multidetector CT imaging of the abdomen and pelvis was performed using the standard protocol following bolus administration of intravenous contrast. Oral contrast also administered. CONTRAST:  165m ISOVUE-300 IOPAMIDOL (ISOVUE-300) INJECTION 61% COMPARISON:  CT abdomen and pelvis November 01, 2014; abdominal MRI November 02, 2014 FINDINGS: Lower chest: There is scarring in the lung bases. There is questionable early infiltrate versus atelectasis in the right middle lobe inferiorly. There is atherosclerotic calcification with peripheral thrombus in the distal thoracic aorta. There is a small hiatal hernia. Hepatobiliary: There is extensive air in the biliary ductal system consistent with previous sphincterotomy. No focal liver lesions are identified. Gallbladder is absent. There is no appreciable biliary duct dilatation. Pancreas: Pancreas appears mildly atrophic. There is chronic pancreatic duct dilatation. No pancreatic mass or inflammatory focus is evident by CT. Spleen: No splenic lesions are appreciable. Adrenals/Urinary Tract: Adrenals show mild hypertrophy, stable. No focal adrenal lesions are evident. There is no hydronephrosis or renal mass on either side. There are areas of scarring in the right kidney, stable. No renal or ureteral calculus is evident. The urinary bladder is midline with wall thickness within normal limits. Stomach/Bowel: Rectum is  mildly distended with stool and air. There is no bowel wall or mesenteric thickening. No bowel obstruction. No free air or portal venous air. There is moderate stool throughout the colon. No bowel pneumatosis. Vascular/Lymphatic: There is extensive atherosclerotic calcification throughout the aorta and iliac arteries without frank aneurysm. There is moderate calcification at the origins of the mesenteric vessels. Note that the aorta is tortuous. Reproductive:  No pelvic mass or pelvic fluid collection. Other: There is no appreciable appendiceal inflammation. No abscess or ascites in the abdomen or pelvis. There is a small ventral hernia containing only fat. Musculoskeletal: There is marked lumbar levoscoliosis. There is extensive osteoarthritic change throughout the lumbar spine. There is a total hip prosthesis on the right. There is moderate osteoarthritic change in the left hip joint. There are no blastic or lytic bone lesions. No intramuscular or abdominal wall lesions. IMPRESSION: Question early pneumonia right middle lobe. Extensive air in the biliary ductal system consistent with prior sphincterotomy. No focal liver lesions evident. Gallbladder absent. Chronic pancreatic duct dilatation.  No pancreatic mass. Small hiatal hernia. Extensive atherosclerotic calcification. Advanced arthropathy in the lower thoracic and lumbar spine. No renal or ureteral calculus. No hydronephrosis. No bowel obstruction. No abscess. Small ventral hernia containing only fat. Electronically Signed   By: WLowella GripIII M.D.   On: 06/13/2015 18:13    Medications: I have reviewed the patient's current medications.  Assesment:   Active Problems:   HYPOKALEMIA   Hypoxemia   RUQ pain   CAP (community acquired pneumonia)    Plan:  Medications reviewed Will continue IV antibiotics Continue regular medications Will monitor CBC.  Zorian Gunderman 06/14/2015, 8:14 AM

## 2015-06-14 NOTE — Consult Note (Signed)
Referring Provider: Dr. Sharl Ma  Primary Care Physician:  Avon Gully, MD Primary Gastroenterologist:  Dr. Jena Gauss   Date of Admission: 06/13/15 Date of Consultation: 06/14/15  Reason for Consultation:  Abdominal pain   HPI:  Lori Bishop is a 82 y.o. year old female with a history of multiple ERCPs in the past secondary to choledocholithiasis, s/p choledochojejunostomy in Sept 2016, calling our office yesterday complaining of severe right-sided abdomnial pain. She was advised to seek medical attention. She presented to the ED and was found to be febrile with fever of 101.1 in the ED, hypoxic, and evidence of right middle lob pneumonia. CT without acute findings. Chronic evidence of dilated pancreatic duct. LFTs normal. Lipase normal.   She tells me that symptoms began 2 evenings ago, with pain in the RUQ "near my incision from last year". Drank water and then vomited. States pain worsened in intensity, prompting evaluation. She notes some improvement in pain today. Appetite is good. No reflux. No dysphagia. No overt GI bleeding. Denies any changes in bowel habits. Would like to advance diet.   Past Medical History  Diagnosis Date  . Gout   . Osteoarthritis   . Leg cramps   . Choledocholithiasis 3 2014 and 5 2014, Dec 2014    S/P ERCP and sphincterotomy x3  . Hypertension     Past Surgical History  Procedure Laterality Date  . Cholecystectomy    . Appendectomy    . Cataract extraction Bilateral   . Wrist arthroplasty Right   . Right femur      metal plate  . Breast biopsy Left   . Ercp  05/23/2012    RMR: juxta-ampullary duodenal diverticulum, markedly dilated biliary tree with choledocholithiasis, s/p sphincterotomy with biliary sphincterotomy balloon dilation, balloon and basket stone extraction, stent placement  . Removal of stones  05/23/2012    Procedure: BALLOON AND BASKET REMOVAL OF STONES;  Surgeon: Corbin Ade, MD;  Location: [facility];  Service: Endoscopy;;  .  Sphincterotomy  05/23/2012    Procedure: Dennison Mascot;  Surgeon: Corbin Ade, MD;  Location: [facility];  Service: Endoscopy;;  . Biliary stent placement  05/23/2012    Procedure: BILIARY STENT PLACEMENT;  Surgeon: Corbin Ade, MD;  Location: [facility];  Service: Endoscopy;;  . Balloon dilation  05/23/2012    Procedure: AMPULLARY BALLOON DILATION;  Surgeon: Corbin Ade, MD;  Location: [facility];  Service: Endoscopy;;  . Ercp N/A 07/24/2012    [id number] duodenal diverticulum/s/p dilation sphincterotomy site s/p basket stone extraction s/p cholangioscopy with holmium lithotripsy of common duct stones  . Spyglass cholangioscopy N/A 07/24/2012    Procedure: YNWGNFAO CHOLANGIOSCOPY;  Surgeon: Corbin Ade, MD;  Location: [facility];  Service: Endoscopy;  Laterality: N/A;  . Spyglass lithotripsy N/A 07/24/2012    Procedure: ZHYQMVHQ LITHOTRIPSY;  Surgeon: Corbin Ade, MD;  Location: [facility];  Service: Endoscopy;  Laterality: N/A;  . Removal of stones N/A 07/24/2012    Procedure: REMOVAL OF STONES;  Surgeon: Corbin Ade, MD;  Location: [facility];  Service: Endoscopy;  Laterality: N/A;  . Balloon dilation N/A 07/24/2012    Procedure: BALLOON DILATION; Balloon basket dredge and balloon dialate sphincterotomy;  Surgeon: Corbin Ade, MD;  Location: [facility];  Service: Endoscopy;  Laterality: N/A;  . Holmium laser application N/A 07/24/2012    Procedure: HOLMIUM LASER APPLICATION;  Surgeon: Corbin Ade, MD;  Location: [facility];  Service: Endoscopy;  Laterality: N/A;  . Ercp N/A 02/17/2013  Dr. Jena Gauss: markedly dilated biliary tree with choledocholithiasis, s/p sphincterotomy and extraction  . Stone extraction with basket N/A 02/17/2013    Procedure: STONE EXTRACTION WITH BASKET and BALLOON;  Surgeon: Corbin Ade, MD;  Location: [facility];  Service: Endoscopy;  Laterality: N/A;  . Balloon dilation N/A 02/17/2013    Procedure: BALLOON DILATION;  Surgeon: Corbin Ade, MD;  Location: [facility];  Service:  Endoscopy;  Laterality: N/A;  . Carpal tunnel release Right   . Choledochoenterostomy N/A 11/09/2014    Procedure: CHOLEDOCHODUODENOSTOMY;  Surgeon: Franky Macho, MD;  Location: [facility];  Service: General;  Laterality: N/A;    Prior to Admission medications   Medication Sig Start Date End Date Taking? Authorizing Provider  allopurinol (ZYLOPRIM) 100 MG tablet Take 100 mg by mouth daily.   Yes Historical Provider, MD  felodipine (PLENDIL) 10 MG 24 hr tablet Take 10 mg by mouth daily.   Yes Historical Provider, MD  fentaNYL (DURAGESIC - DOSED MCG/HR) 50 MCG/HR Place 50 mcg onto the skin every 3 (three) days.   Yes Historical Provider, MD  furosemide (LASIX) 40 MG tablet Take 60 mg by mouth 2 (two) times daily.   Yes Historical Provider, MD  gabapentin (NEURONTIN) 100 MG capsule TAKE 3 CAPSULES BY MOUTH THREE TIMES DAILY. 11/18/14  Yes Anson Fret, MD  metolazone (ZAROXOLYN) 2.5 MG tablet Take 2.5 mg by mouth daily as needed (for fluid).  08/10/13  Yes Historical Provider, MD  polyethylene glycol (MIRALAX / GLYCOLAX) packet Take 17 g by mouth daily as needed for moderate constipation.   Yes Historical Provider, MD  potassium chloride SA (K-DUR,KLOR-CON) 20 MEQ tablet Take 40 mEq by mouth daily.    Yes Historical Provider, MD  traMADol-acetaminophen (ULTRACET) 37.5-325 MG per tablet Take 1 tablet by mouth 3 (three) times daily as needed for pain.   Yes Historical Provider, MD    Current Facility-Administered Medications  Medication Dose Route Frequency Provider Last Rate Last Dose  . 0.9 %  sodium chloride infusion   Intravenous Continuous Meredeth Ide, MD 10 mL/hr at 06/13/15 2115    . allopurinol (ZYLOPRIM) tablet 100 mg  100 mg Oral Daily Meredeth Ide, MD      . azithromycin (ZITHROMAX) 500 mg in dextrose 5 % 250 mL IVPB  500 mg Intravenous Q24H Meredeth Ide, MD      . cefTRIAXone (ROCEPHIN) 1 g in dextrose 5 % 50 mL IVPB  1 g Intravenous Q24H Avon Gully, MD      . enoxaparin (LOVENOX)  injection 30 mg  30 mg Subcutaneous Q24H Meredeth Ide, MD   30 mg at 06/13/15 2135  . felodipine (PLENDIL) 24 hr tablet 10 mg  10 mg Oral Daily Meredeth Ide, MD      . Melene Muller ON 06/16/2015] fentaNYL (DURAGESIC - dosed mcg/hr) 50 mcg  50 mcg Transdermal Q72H Meredeth Ide, MD      . furosemide (LASIX) tablet 60 mg  60 mg Oral BID Meredeth Ide, MD      . gabapentin (NEURONTIN) capsule 300 mg  300 mg Oral TID Meredeth Ide, MD   300 mg at 06/13/15 2135  . metolazone (ZAROXOLYN) tablet 2.5 mg  2.5 mg Oral Daily PRN Meredeth Ide, MD      . morphine 2 MG/ML injection 1 mg  1 mg Intravenous Q4H PRN Meredeth Ide, MD      . ondansetron (ZOFRAN) tablet 4 mg  4 mg  Oral Q6H PRN Meredeth Ide, MD       Or  . ondansetron (ZOFRAN) injection 4 mg  4 mg Intravenous Q6H PRN Meredeth Ide, MD      . polyethylene glycol (MIRALAX / GLYCOLAX) packet 17 g  17 g Oral Daily PRN Meredeth Ide, MD      . potassium chloride SA (K-DUR,KLOR-CON) CR tablet 40 mEq  40 mEq Oral Daily Meredeth Ide, MD      . traMADol-acetaminophen (ULTRACET) 37.5-325 MG per tablet 1 tablet  1 tablet Oral Q8H PRN Meredeth Ide, MD   1 tablet at 06/14/15 0028    Allergies as of 06/13/2015  . (No Known Allergies)    Family History  Problem Relation Age of Onset  . Stroke Father   . CAD Mother   . Cancer Son     ?metastatic  . Colon cancer Neg Hx     Social History   Social History  . Marital Status: Widowed    Spouse Name: N/A  . Number of Children: 1  . Years of Education: College   Occupational History  . 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 Smoking status: Former Smoker -- 0.25 packs/day for 15 years    Types: Cigarettes    Quit date: 07/17/1988  . Smokeless tobacco: Never Used  . Alcohol Use: No  . Drug Use: No  . Sexual Activity: No   Other Topics Concern  . Not on file   Social History Narrative   Patient is single with one child.   Patient is right handed.   Patient is a Charity fundraiser    Patient drinks 1 cup  daily.    Candiss Norse, niece & caregiver, as well as POA             Review of Systems: Gen: Denies fever, chills, loss of appetite, change in weight or weight loss CV: Denies chest pain, heart palpitations, syncope, edema  Resp: Denies shortness of breath with rest, cough, wheezing GI: see HPI  GU : Denies urinary burning, urinary frequency, urinary incontinence.  MS: +arthritic pain  Derm: Denies rash, itching, dry skin Psych: Denies depression, anxiety,confusion, or memory loss Heme: Denies bruising, bleeding, and enlarged lymph nodes.  Physical Exam: Vital signs in last 24 hours: Temp:  [97.7 F (36.5 C)-101.1 F (38.4 C)] 97.7 F (36.5 C) (04/11 0452) Pulse Rate:  [64-97] 64 (04/11 0452) Resp:  [15-20] 20 (04/11 0452) BP: (85-144)/(42-58) 144/58 mmHg (04/11 0452) SpO2:  [91 %-100 %] 99 % (04/11 0452) Weight:  [123 lb (55.792 kg)] 123 lb (55.792 kg) (04/10 2139) Last BM Date: 06/13/15 General:   Alert,  Well-developed, well-nourished, pleasant and cooperative in NAD Head:  Normocephalic and atraumatic. Eyes:  Sclera clear, no icterus.   Conjunctiva pink. Ears:  Normal auditory acuity. Nose:  No deformity, discharge,  or lesions. Mouth:  No deformity or lesions, dentition normal. Lungs:  Clear throughout to auscultation.   No wheezes, crackles, or rhonchi. No acute distress. Heart:  Regular rate and rhythm Abdomen:  Soft, very mild discomfort RUQ to right of incision from prior choledochojejunostomy and nondistended. Normal bowel sounds. Small umbilical hernia.  Rectal:  Deferred  Msk:  Symmetrical without gross deformities. Normal posture. Extremities:  Without edema. Neurologic:  Alert and  oriented x4;  grossly normal neurologically. Psych:  Alert and cooperative. Normal mood and affect.  Intake/Output from previous day:   Intake/Output this shift:  Lab Results:  Recent Labs  06/13/15 1520 06/14/15 0533  WBC 22.4* 15.0*  HGB 16.6* 13.9  HCT 47.2*  39.9  PLT 271 236   BMET  Recent Labs  06/13/15 1520 06/14/15 0533  NA 138 139  K 3.1* 4.1  CL 92* 96*  CO2 33* 36*  GLUCOSE 120* 96  BUN 17 14  CREATININE 1.00 1.08*  CALCIUM 9.5 8.9   LFT  Recent Labs  06/13/15 1520 06/14/15 0533  PROT 8.1 6.5  ALBUMIN 4.2 3.2*  AST 23 19  ALT 15 12*  ALKPHOS 116 89  BILITOT 0.9 0.7    Studies/Results: Dg Chest 2 View  06/13/2015  CLINICAL DATA:  Acute onset of right upper quadrant abdominal pain and vomiting. Initial encounter. EXAM: CHEST  2 VIEW COMPARISON:  Chest radiograph from 11/12/2014 FINDINGS: The lungs are well-aerated. Mild right basilar opacity could reflect mild pneumonia, depending on the patient's symptoms. There is no evidence of pleural effusion or pneumothorax. The heart is normal in size; the mediastinal contour is within normal limits. No acute osseous abnormalities are seen. IMPRESSION: Mild right basilar opacity could reflect mild pneumonia, depending on the patient's symptoms. Electronically Signed   By: Roanna RaiderJeffery  Chang M.D.   On: 06/13/2015 20:10   Ct Abdomen Pelvis W Contrast  06/13/2015  CLINICAL DATA:  Abdominal pain, primarily epigastric and right upper quadrant. EXAM: CT ABDOMEN AND PELVIS WITH CONTRAST TECHNIQUE: Multidetector CT imaging of the abdomen and pelvis was performed using the standard protocol following bolus administration of intravenous contrast. Oral contrast also administered. CONTRAST:  100mL ISOVUE-300 IOPAMIDOL (ISOVUE-300) INJECTION 61% COMPARISON:  CT abdomen and pelvis November 01, 2014; abdominal MRI November 02, 2014 FINDINGS: Lower chest: There is scarring in the lung bases. There is questionable early infiltrate versus atelectasis in the right middle lobe inferiorly. There is atherosclerotic calcification with peripheral thrombus in the distal thoracic aorta. There is a small hiatal hernia. Hepatobiliary: There is extensive air in the biliary ductal system consistent with previous  sphincterotomy. No focal liver lesions are identified. Gallbladder is absent. There is no appreciable biliary duct dilatation. Pancreas: Pancreas appears mildly atrophic. There is chronic pancreatic duct dilatation. No pancreatic mass or inflammatory focus is evident by CT. Spleen: No splenic lesions are appreciable. Adrenals/Urinary Tract: Adrenals show mild hypertrophy, stable. No focal adrenal lesions are evident. There is no hydronephrosis or renal mass on either side. There are areas of scarring in the right kidney, stable. No renal or ureteral calculus is evident. The urinary bladder is midline with wall thickness within normal limits. Stomach/Bowel: Rectum is mildly distended with stool and air. There is no bowel wall or mesenteric thickening. No bowel obstruction. No free air or portal venous air. There is moderate stool throughout the colon. No bowel pneumatosis. Vascular/Lymphatic: There is extensive atherosclerotic calcification throughout the aorta and iliac arteries without frank aneurysm. There is moderate calcification at the origins of the mesenteric vessels. Note that the aorta is tortuous. Reproductive:  No pelvic mass or pelvic fluid collection. Other: There is no appreciable appendiceal inflammation. No abscess or ascites in the abdomen or pelvis. There is a small ventral hernia containing only fat. Musculoskeletal: There is marked lumbar levoscoliosis. There is extensive osteoarthritic change throughout the lumbar spine. There is a total hip prosthesis on the right. There is moderate osteoarthritic change in the left hip joint. There are no blastic or lytic bone lesions. No intramuscular or abdominal wall lesions. IMPRESSION: Question early pneumonia right middle lobe. Extensive  air in the biliary ductal system consistent with prior sphincterotomy. No focal liver lesions evident. Gallbladder absent. Chronic pancreatic duct dilatation.  No pancreatic mass. Small hiatal hernia. Extensive  atherosclerotic calcification. Advanced arthropathy in the lower thoracic and lumbar spine. No renal or ureteral calculus. No hydronephrosis. No bowel obstruction. No abscess. Small ventral hernia containing only fat. Electronically Signed   By: Bretta Bang III M.D.   On: 06/13/2015 18:13    Impression: 82 year old female with history of multiple ERCPs in the past secondary to choledocholithiasis, s/p choledochojejunostomy in Sept 2016, presenting with right-sided pain, fever, and found to have pneumonia. She has a history of chronically dilation pancreatic duct, noted on CT April 2017 but not a new finding. No evidence for any acute GI issues as culprit for RUQ pain. Will continue to follow with you; supportive measures recommended.   Plan: Continue supportive measures Will continue to follow with you LFTs in am  Nira Retort, ANP-BC [facility]        06/14/2015, 8:22 AM

## 2015-06-15 DIAGNOSIS — R109 Unspecified abdominal pain: Secondary | ICD-10-CM | POA: Diagnosis not present

## 2015-06-15 DIAGNOSIS — J189 Pneumonia, unspecified organism: Secondary | ICD-10-CM | POA: Diagnosis not present

## 2015-06-15 LAB — BASIC METABOLIC PANEL
ANION GAP: 7 (ref 5–15)
BUN: 10 mg/dL (ref 6–20)
CO2: 34 mmol/L — ABNORMAL HIGH (ref 22–32)
Calcium: 8.5 mg/dL — ABNORMAL LOW (ref 8.9–10.3)
Chloride: 97 mmol/L — ABNORMAL LOW (ref 101–111)
Creatinine, Ser: 0.82 mg/dL (ref 0.44–1.00)
GFR calc non Af Amer: >60 mL/min (ref >60)
GLUCOSE: 92 mg/dL (ref 65–99)
POTASSIUM: 3.3 mmol/L — AB (ref 3.5–5.1)
Sodium: 138 mmol/L (ref 135–145)

## 2015-06-15 LAB — HEPATIC FUNCTION PANEL
ALT: 16 U/L (ref 14–54)
AST: 24 U/L (ref 15–41)
Albumin: 3 g/dL — ABNORMAL LOW (ref 3.5–5.0)
Alkaline Phosphatase: 87 U/L (ref 38–126)
BILIRUBIN DIRECT: 0.1 mg/dL (ref 0.1–0.5)
BILIRUBIN INDIRECT: 0.4 mg/dL (ref 0.3–0.9)
TOTAL PROTEIN: 6.2 g/dL — AB (ref 6.5–8.1)
Total Bilirubin: 0.5 mg/dL (ref 0.3–1.2)

## 2015-06-15 LAB — CBC
HEMATOCRIT: 37.5 % (ref 36.0–46.0)
Hemoglobin: 12.6 g/dL (ref 12.0–15.0)
MCH: 27.1 pg (ref 26.0–34.0)
MCHC: 33.6 g/dL (ref 30.0–36.0)
MCV: 80.6 fL (ref 78.0–100.0)
Platelets: 222 10*3/uL (ref 150–400)
RBC: 4.65 MIL/uL (ref 3.87–5.11)
RDW: 15.8 % — ABNORMAL HIGH (ref 11.5–15.5)
WBC: 9 10*3/uL (ref 4.0–10.5)

## 2015-06-15 LAB — STREP PNEUMONIAE URINARY ANTIGEN: Strep Pneumo Urinary Antigen: NEGATIVE

## 2015-06-15 NOTE — Progress Notes (Signed)
Subjective: Patient feels better today. Her fever has subsided. She is being evaluated by GI for RUQ. Patient is on combinations of iv antibiotics for pneumonia. Her leucocytosis has improved.  Objective: Vital signs in last 24 hours: Temp:  [97.7 F (36.5 C)-98 F (36.7 C)] 97.7 F (36.5 C) (04/12 0558) Pulse Rate:  [60-80] 60 (04/12 0558) Resp:  [18-20] 20 (04/12 0558) BP: (105-120)/(46-57) 120/57 mmHg (04/12 0558) SpO2:  [95 %-100 %] 100 % (04/12 0558) Weight change:  Last BM Date: 06/14/15  Intake/Output from previous day: 04/11 0701 - 04/12 0700 In: 840 [P.O.:840] Out: 1700 [Urine:1700]  PHYSICAL EXAM General appearance: alert, cooperative and no distress Resp: diminished breath sounds bilaterally and rhonchi bilaterally Cardio: S1, S2 normal GI: soft, non-tender; bowel sounds normal; no masses,  no organomegaly Extremities: extremities normal, atraumatic, no cyanosis or edema  Lab Results:  Results for orders placed or performed during the hospital encounter of 06/13/15 (from the past 48 hour(s))  Comprehensive metabolic panel     Status: Abnormal   Collection Time: 06/13/15  3:20 PM  Result Value Ref Range   Sodium 138 135 - 145 mmol/L   Potassium 3.1 (L) 3.5 - 5.1 mmol/L   Chloride 92 (L) 101 - 111 mmol/L   CO2 33 (H) 22 - 32 mmol/L   Glucose, Bld 120 (H) 65 - 99 mg/dL   BUN 17 6 - 20 mg/dL   Creatinine, Ser 1.00 0.44 - 1.00 mg/dL   Calcium 9.5 8.9 - 10.3 mg/dL   Total Protein 8.1 6.5 - 8.1 g/dL   Albumin 4.2 3.5 - 5.0 g/dL   AST 23 15 - 41 U/L   ALT 15 14 - 54 U/L   Alkaline Phosphatase 116 38 - 126 U/L   Total Bilirubin 0.9 0.3 - 1.2 mg/dL   GFR calc non Af Amer 47 (L) >60 mL/min   GFR calc Af Amer 55 (L) >60 mL/min    Comment: (NOTE) The eGFR has been calculated using the CKD EPI equation. This calculation has not been validated in all clinical situations. eGFR's persistently <60 mL/min signify possible Chronic Kidney Disease.    Anion gap 13 5 - 15   Lipase, blood     Status: None   Collection Time: 06/13/15  3:20 PM  Result Value Ref Range   Lipase 25 11 - 51 U/L  CBC with Differential     Status: Abnormal   Collection Time: 06/13/15  3:20 PM  Result Value Ref Range   WBC 22.4 (H) 4.0 - 10.5 K/uL   RBC 5.92 (H) 3.87 - 5.11 MIL/uL   Hemoglobin 16.6 (H) 12.0 - 15.0 g/dL   HCT 47.2 (H) 36.0 - 46.0 %   MCV 79.7 78.0 - 100.0 fL   MCH 28.0 26.0 - 34.0 pg   MCHC 35.2 30.0 - 36.0 g/dL   RDW 15.8 (H) 11.5 - 15.5 %   Platelets 271 150 - 400 K/uL   Neutrophils Relative % 85 %   Neutro Abs 19.0 (H) 1.7 - 7.7 K/uL   Lymphocytes Relative 6 %   Lymphs Abs 1.2 0.7 - 4.0 K/uL   Monocytes Relative 9 %   Monocytes Absolute 2.1 (H) 0.1 - 1.0 K/uL   Eosinophils Relative 0 %   Eosinophils Absolute 0.0 0.0 - 0.7 K/uL   Basophils Relative 0 %   Basophils Absolute 0.0 0.0 - 0.1 K/uL  Culture, blood (Routine X 2) w Reflex to ID Panel     Status: None (  Preliminary result)   Collection Time: 06/13/15  8:05 PM  Result Value Ref Range   Specimen Description BLOOD LEFT ARM    Special Requests BOTTLES DRAWN AEROBIC AND ANAEROBIC 6CC    Culture NO GROWTH < 24 HOURS    Report Status PENDING   Culture, blood (Routine X 2) w Reflex to ID Panel     Status: None (Preliminary result)   Collection Time: 06/13/15  8:06 PM  Result Value Ref Range   Specimen Description BLOOD RIGHT HAND    Special Requests BOTTLES DRAWN AEROBIC AND ANAEROBIC 6CC    Culture NO GROWTH < 24 HOURS    Report Status PENDING   CBC     Status: Abnormal   Collection Time: 06/14/15  5:33 AM  Result Value Ref Range   WBC 15.0 (H) 4.0 - 10.5 K/uL   RBC 4.97 3.87 - 5.11 MIL/uL   Hemoglobin 13.9 12.0 - 15.0 g/dL   HCT 39.9 36.0 - 46.0 %   MCV 80.3 78.0 - 100.0 fL   MCH 28.0 26.0 - 34.0 pg   MCHC 34.8 30.0 - 36.0 g/dL   RDW 16.0 (H) 11.5 - 15.5 %   Platelets 236 150 - 400 K/uL  Comprehensive metabolic panel     Status: Abnormal   Collection Time: 06/14/15  5:33 AM  Result Value  Ref Range   Sodium 139 135 - 145 mmol/L   Potassium 4.1 3.5 - 5.1 mmol/L    Comment: DELTA CHECK NOTED   Chloride 96 (L) 101 - 111 mmol/L   CO2 36 (H) 22 - 32 mmol/L   Glucose, Bld 96 65 - 99 mg/dL   BUN 14 6 - 20 mg/dL   Creatinine, Ser 1.08 (H) 0.44 - 1.00 mg/dL   Calcium 8.9 8.9 - 10.3 mg/dL   Total Protein 6.5 6.5 - 8.1 g/dL   Albumin 3.2 (L) 3.5 - 5.0 g/dL   AST 19 15 - 41 U/L   ALT 12 (L) 14 - 54 U/L   Alkaline Phosphatase 89 38 - 126 U/L   Total Bilirubin 0.7 0.3 - 1.2 mg/dL   GFR calc non Af Amer 43 (L) >60 mL/min   GFR calc Af Amer 50 (L) >60 mL/min    Comment: (NOTE) The eGFR has been calculated using the CKD EPI equation. This calculation has not been validated in all clinical situations. eGFR's persistently <60 mL/min signify possible Chronic Kidney Disease.    Anion gap 7 5 - 15  Hepatic function panel     Status: Abnormal   Collection Time: 06/15/15  6:05 AM  Result Value Ref Range   Total Protein 6.2 (L) 6.5 - 8.1 g/dL   Albumin 3.0 (L) 3.5 - 5.0 g/dL   AST 24 15 - 41 U/L   ALT 16 14 - 54 U/L   Alkaline Phosphatase 87 38 - 126 U/L   Total Bilirubin 0.5 0.3 - 1.2 mg/dL   Bilirubin, Direct 0.1 0.1 - 0.5 mg/dL   Indirect Bilirubin 0.4 0.3 - 0.9 mg/dL  Basic metabolic panel     Status: Abnormal   Collection Time: 06/15/15  6:05 AM  Result Value Ref Range   Sodium 138 135 - 145 mmol/L   Potassium 3.3 (L) 3.5 - 5.1 mmol/L    Comment: DELTA CHECK NOTED   Chloride 97 (L) 101 - 111 mmol/L   CO2 34 (H) 22 - 32 mmol/L   Glucose, Bld 92 65 - 99 mg/dL   BUN 10 6 -  20 mg/dL   Creatinine, Ser 0.82 0.44 - 1.00 mg/dL   Calcium 8.5 (L) 8.9 - 10.3 mg/dL   GFR calc non Af Amer >60 >60 mL/min   GFR calc Af Amer >60 >60 mL/min    Comment: (NOTE) The eGFR has been calculated using the CKD EPI equation. This calculation has not been validated in all clinical situations. eGFR's persistently <60 mL/min signify possible Chronic Kidney Disease.    Anion gap 7 5 - 15  CBC      Status: Abnormal   Collection Time: 06/15/15  6:05 AM  Result Value Ref Range   WBC 9.0 4.0 - 10.5 K/uL   RBC 4.65 3.87 - 5.11 MIL/uL   Hemoglobin 12.6 12.0 - 15.0 g/dL   HCT 37.5 36.0 - 46.0 %   MCV 80.6 78.0 - 100.0 fL   MCH 27.1 26.0 - 34.0 pg   MCHC 33.6 30.0 - 36.0 g/dL   RDW 15.8 (H) 11.5 - 15.5 %   Platelets 222 150 - 400 K/uL    ABGS No results for input(s): PHART, PO2ART, TCO2, HCO3 in the last 72 hours.  Invalid input(s): PCO2 CULTURES Recent Results (from the past 240 hour(s))  Culture, blood (Routine X 2) w Reflex to ID Panel     Status: None (Preliminary result)   Collection Time: 06/13/15  8:05 PM  Result Value Ref Range Status   Specimen Description BLOOD LEFT ARM  Final   Special Requests BOTTLES DRAWN AEROBIC AND ANAEROBIC 6CC  Final   Culture NO GROWTH < 24 HOURS  Final   Report Status PENDING  Incomplete  Culture, blood (Routine X 2) w Reflex to ID Panel     Status: None (Preliminary result)   Collection Time: 06/13/15  8:06 PM  Result Value Ref Range Status   Specimen Description BLOOD RIGHT HAND  Final   Special Requests BOTTLES DRAWN AEROBIC AND ANAEROBIC 6CC  Final   Culture NO GROWTH < 24 HOURS  Final   Report Status PENDING  Incomplete   Studies/Results: Dg Chest 2 View  06/13/2015  CLINICAL DATA:  Acute onset of right upper quadrant abdominal pain and vomiting. Initial encounter. EXAM: CHEST  2 VIEW COMPARISON:  Chest radiograph from 11/12/2014 FINDINGS: The lungs are well-aerated. Mild right basilar opacity could reflect mild pneumonia, depending on the patient's symptoms. There is no evidence of pleural effusion or pneumothorax. The heart is normal in size; the mediastinal contour is within normal limits. No acute osseous abnormalities are seen. IMPRESSION: Mild right basilar opacity could reflect mild pneumonia, depending on the patient's symptoms. Electronically Signed   By: Garald Balding M.D.   On: 06/13/2015 20:10   Ct Abdomen Pelvis W  Contrast  06/13/2015  CLINICAL DATA:  Abdominal pain, primarily epigastric and right upper quadrant. EXAM: CT ABDOMEN AND PELVIS WITH CONTRAST TECHNIQUE: Multidetector CT imaging of the abdomen and pelvis was performed using the standard protocol following bolus administration of intravenous contrast. Oral contrast also administered. CONTRAST:  164m ISOVUE-300 IOPAMIDOL (ISOVUE-300) INJECTION 61% COMPARISON:  CT abdomen and pelvis November 01, 2014; abdominal MRI November 02, 2014 FINDINGS: Lower chest: There is scarring in the lung bases. There is questionable early infiltrate versus atelectasis in the right middle lobe inferiorly. There is atherosclerotic calcification with peripheral thrombus in the distal thoracic aorta. There is a small hiatal hernia. Hepatobiliary: There is extensive air in the biliary ductal system consistent with previous sphincterotomy. No focal liver lesions are identified. Gallbladder is absent. There is  no appreciable biliary duct dilatation. Pancreas: Pancreas appears mildly atrophic. There is chronic pancreatic duct dilatation. No pancreatic mass or inflammatory focus is evident by CT. Spleen: No splenic lesions are appreciable. Adrenals/Urinary Tract: Adrenals show mild hypertrophy, stable. No focal adrenal lesions are evident. There is no hydronephrosis or renal mass on either side. There are areas of scarring in the right kidney, stable. No renal or ureteral calculus is evident. The urinary bladder is midline with wall thickness within normal limits. Stomach/Bowel: Rectum is mildly distended with stool and air. There is no bowel wall or mesenteric thickening. No bowel obstruction. No free air or portal venous air. There is moderate stool throughout the colon. No bowel pneumatosis. Vascular/Lymphatic: There is extensive atherosclerotic calcification throughout the aorta and iliac arteries without frank aneurysm. There is moderate calcification at the origins of the mesenteric vessels.  Note that the aorta is tortuous. Reproductive:  No pelvic mass or pelvic fluid collection. Other: There is no appreciable appendiceal inflammation. No abscess or ascites in the abdomen or pelvis. There is a small ventral hernia containing only fat. Musculoskeletal: There is marked lumbar levoscoliosis. There is extensive osteoarthritic change throughout the lumbar spine. There is a total hip prosthesis on the right. There is moderate osteoarthritic change in the left hip joint. There are no blastic or lytic bone lesions. No intramuscular or abdominal wall lesions. IMPRESSION: Question early pneumonia right middle lobe. Extensive air in the biliary ductal system consistent with prior sphincterotomy. No focal liver lesions evident. Gallbladder absent. Chronic pancreatic duct dilatation.  No pancreatic mass. Small hiatal hernia. Extensive atherosclerotic calcification. Advanced arthropathy in the lower thoracic and lumbar spine. No renal or ureteral calculus. No hydronephrosis. No bowel obstruction. No abscess. Small ventral hernia containing only fat. Electronically Signed   By: Lowella Grip III M.D.   On: 06/13/2015 18:13    Medications: I have reviewed the patient's current medications.  Assesment:   Active Problems:   HYPOKALEMIA   Hypoxemia   RUQ pain   CAP (community acquired pneumonia)    Plan:  Medications reviewed Will continue IV antibiotics Continue regular medications Gi consult appreciated.      Lilton Pare 06/15/2015, 8:05 AM

## 2015-06-15 NOTE — Progress Notes (Signed)
Subjective: RUQ pain improving. No N/V. Tolerating diet.   Objective: Vital signs in last 24 hours: Temp:  [97.7 F (36.5 C)-98 F (36.7 C)] 97.7 F (36.5 C) (04/12 0558) Pulse Rate:  [60-80] 60 (04/12 0558) Resp:  [18-20] 20 (04/12 0558) BP: (105-120)/(46-57) 120/57 mmHg (04/12 0558) SpO2:  [95 %-100 %] 100 % (04/12 0558) Last BM Date: 06/14/15 General:   Alert and oriented, pleasant Head:  Normocephalic and atraumatic. Abdomen:  Bowel sounds present, soft, mild TTP RUQ, non-distended. Small umbilical hernia Psych:  Alert and cooperative. Normal mood and affect.  Intake/Output from previous day: 04/11 0701 - 04/12 0700 In: 840 [P.O.:840] Out: 1700 [Urine:1700] Intake/Output this shift:    Lab Results:  Recent Labs  06/13/15 1520 06/14/15 0533 06/15/15 0605  WBC 22.4* 15.0* 9.0  HGB 16.6* 13.9 12.6  HCT 47.2* 39.9 37.5  PLT 271 236 222   BMET  Recent Labs  06/13/15 1520 06/14/15 0533 06/15/15 0605  NA 138 139 138  K 3.1* 4.1 3.3*  CL 92* 96* 97*  CO2 33* 36* 34*  GLUCOSE 120* 96 92  BUN 17 14 10   CREATININE 1.00 1.08* 0.82  CALCIUM 9.5 8.9 8.5*   LFT  Recent Labs  06/13/15 1520 06/14/15 0533 06/15/15 0605  PROT 8.1 6.5 6.2*  ALBUMIN 4.2 3.2* 3.0*  AST 23 19 24   ALT 15 12* 16  ALKPHOS 116 89 87  BILITOT 0.9 0.7 0.5  BILIDIR  --   --  0.1  IBILI  --   --  0.4     Studies/Results: Dg Chest 2 View  06/13/2015  CLINICAL DATA:  Acute onset of right upper quadrant abdominal pain and vomiting. Initial encounter. EXAM: CHEST  2 VIEW COMPARISON:  Chest radiograph from 11/12/2014 FINDINGS: The lungs are well-aerated. Mild right basilar opacity could reflect mild pneumonia, depending on the patient's symptoms. There is no evidence of pleural effusion or pneumothorax. The heart is normal in size; the mediastinal contour is within normal limits. No acute osseous abnormalities are seen. IMPRESSION: Mild right basilar opacity could reflect mild  pneumonia, depending on the patient's symptoms. Electronically Signed   By: Roanna RaiderJeffery  Chang M.D.   On: 06/13/2015 20:10   Ct Abdomen Pelvis W Contrast  06/13/2015  CLINICAL DATA:  Abdominal pain, primarily epigastric and right upper quadrant. EXAM: CT ABDOMEN AND PELVIS WITH CONTRAST TECHNIQUE: Multidetector CT imaging of the abdomen and pelvis was performed using the standard protocol following bolus administration of intravenous contrast. Oral contrast also administered. CONTRAST:  100mL ISOVUE-300 IOPAMIDOL (ISOVUE-300) INJECTION 61% COMPARISON:  CT abdomen and pelvis November 01, 2014; abdominal MRI November 02, 2014 FINDINGS: Lower chest: There is scarring in the lung bases. There is questionable early infiltrate versus atelectasis in the right middle lobe inferiorly. There is atherosclerotic calcification with peripheral thrombus in the distal thoracic aorta. There is a small hiatal hernia. Hepatobiliary: There is extensive air in the biliary ductal system consistent with previous sphincterotomy. No focal liver lesions are identified. Gallbladder is absent. There is no appreciable biliary duct dilatation. Pancreas: Pancreas appears mildly atrophic. There is chronic pancreatic duct dilatation. No pancreatic mass or inflammatory focus is evident by CT. Spleen: No splenic lesions are appreciable. Adrenals/Urinary Tract: Adrenals show mild hypertrophy, stable. No focal adrenal lesions are evident. There is no hydronephrosis or renal mass on either side. There are areas of scarring in the right kidney, stable. No renal or ureteral calculus is evident. The urinary bladder is midline with  wall thickness within normal limits. Stomach/Bowel: Rectum is mildly distended with stool and air. There is no bowel wall or mesenteric thickening. No bowel obstruction. No free air or portal venous air. There is moderate stool throughout the colon. No bowel pneumatosis. Vascular/Lymphatic: There is extensive atherosclerotic  calcification throughout the aorta and iliac arteries without frank aneurysm. There is moderate calcification at the origins of the mesenteric vessels. Note that the aorta is tortuous. Reproductive:  No pelvic mass or pelvic fluid collection. Other: There is no appreciable appendiceal inflammation. No abscess or ascites in the abdomen or pelvis. There is a small ventral hernia containing only fat. Musculoskeletal: There is marked lumbar levoscoliosis. There is extensive osteoarthritic change throughout the lumbar spine. There is a total hip prosthesis on the right. There is moderate osteoarthritic change in the left hip joint. There are no blastic or lytic bone lesions. No intramuscular or abdominal wall lesions. IMPRESSION: Question early pneumonia right middle lobe. Extensive air in the biliary ductal system consistent with prior sphincterotomy. No focal liver lesions evident. Gallbladder absent. Chronic pancreatic duct dilatation.  No pancreatic mass. Small hiatal hernia. Extensive atherosclerotic calcification. Advanced arthropathy in the lower thoracic and lumbar spine. No renal or ureteral calculus. No hydronephrosis. No bowel obstruction. No abscess. Small ventral hernia containing only fat. Electronically Signed   By: Bretta Bang III M.D.   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 She has a history of chronically dilation pancreatic duct, noted on CT April 2017 but not a new finding. No evidence for any acute GI issues as culprit for RUQ pain. LFTs remain normal. Clinically improving. From a GI standpoint, no further work-up is needed.   Plan: Continue supportive measures We will follow peripherally Follow-up in office prn  Nira Retort, ANP-BC [facility]       06/15/2015,  8:12 AM

## 2015-06-16 DIAGNOSIS — J189 Pneumonia, unspecified organism: Secondary | ICD-10-CM | POA: Diagnosis not present

## 2015-06-16 DIAGNOSIS — R109 Unspecified abdominal pain: Secondary | ICD-10-CM | POA: Diagnosis not present

## 2015-06-16 MED ORDER — AMOXICILLIN-POT CLAVULANATE 500-125 MG PO TABS: 1.0000 | ORAL_TABLET | Freq: Three times a day (TID) | ORAL | Status: DC

## 2015-06-16 MED ORDER — POTASSIUM CHLORIDE CRYS ER 20 MEQ PO TBCR
40.0000 meq | EXTENDED_RELEASE_TABLET | Freq: Once | ORAL | Status: AC
  Administered 2015-06-16: 40 meq via ORAL

## 2015-06-16 MED ORDER — PANTOPRAZOLE SODIUM 40 MG PO TBEC: 40.0000 mg | DELAYED_RELEASE_TABLET | Freq: Every day | ORAL | Status: AC

## 2015-06-16 NOTE — Discharge Summary (Signed)
Physician Discharge Summary  Patient ID: Lori Bishop A Chovan MRN: [id number] DOB/AGE: [birth year] 82 y.o. Primary Care Physician:Prabhav Faulkenberry, MD Admit date: 06/13/2015 Discharge date: 06/16/2015    Discharge Diagnoses:   Active Problems:   HYPOKALEMIA   Hypoxemia   RUQ pain   CAP (community acquired pneumonia)     Medication List    TAKE these medications        allopurinol 100 MG tablet  Commonly known as:  ZYLOPRIM  Take 100 mg by mouth daily.     amoxicillin-clavulanate 500-125 MG tablet  Commonly known as:  AUGMENTIN  Take 1 tablet (500 mg total) by mouth 3 (three) times daily.     felodipine 10 MG 24 hr tablet  Commonly known as:  PLENDIL  Take 10 mg by mouth daily.     fentaNYL 50 MCG/HR  Commonly known as:  DURAGESIC - dosed mcg/hr  Place 50 mcg onto the skin every 3 (three) days.     furosemide 40 MG tablet  Commonly known as:  LASIX  Take 60 mg by mouth 2 (two) times daily.     gabapentin 100 MG capsule  Commonly known as:  NEURONTIN  TAKE 3 CAPSULES BY MOUTH THREE TIMES DAILY.     metolazone 2.5 MG tablet  Commonly known as:  ZAROXOLYN  Take 2.5 mg by mouth daily as needed (for fluid).     pantoprazole 40 MG tablet  Commonly known as:  PROTONIX  Take 1 tablet (40 mg total) by mouth daily before breakfast.     polyethylene glycol packet  Commonly known as:  MIRALAX / GLYCOLAX  Take 17 g by mouth daily as needed for moderate constipation.     potassium chloride SA 20 MEQ tablet  Commonly known as:  K-DUR,KLOR-CON  Take 40 mEq by mouth daily.     traMADol-acetaminophen 37.5-325 MG tablet  Commonly known as:  ULTRACET  Take 1 tablet by mouth 3 (three) times daily as needed for pain.        Discharged Condition: improved    Consults: GI   Significant Diagnostic Studies: Dg Chest 2 View  06/13/2015  CLINICAL DATA:  Acute onset of right upper quadrant abdominal pain and vomiting. Initial encounter. EXAM: CHEST  2 VIEW COMPARISON:  Chest  radiograph from 11/12/2014 FINDINGS: The lungs are well-aerated. Mild right basilar opacity could reflect mild pneumonia, depending on the patient's symptoms. There is no evidence of pleural effusion or pneumothorax. The heart is normal in size; the mediastinal contour is within normal limits. No acute osseous abnormalities are seen. IMPRESSION: Mild right basilar opacity could reflect mild pneumonia, depending on the patient's symptoms. Electronically Signed   By: Roanna RaiderJeffery  Chang M.D.   On: 06/13/2015 20:10   Ct Abdomen Pelvis W Contrast  06/13/2015  CLINICAL DATA:  Abdominal pain, primarily epigastric and right upper quadrant. EXAM: CT ABDOMEN AND PELVIS WITH CONTRAST TECHNIQUE: Multidetector CT imaging of the abdomen and pelvis was performed using the standard protocol following bolus administration of intravenous contrast. Oral contrast also administered. CONTRAST:  100mL ISOVUE-300 IOPAMIDOL (ISOVUE-300) INJECTION 61% COMPARISON:  CT abdomen and pelvis November 01, 2014; abdominal MRI November 02, 2014 FINDINGS: Lower chest: There is scarring in the lung bases. There is questionable early infiltrate versus atelectasis in the right middle lobe inferiorly. There is atherosclerotic calcification with peripheral thrombus in the distal thoracic aorta. There is a small hiatal hernia. Hepatobiliary: There is extensive air in the biliary ductal system consistent with previous sphincterotomy. No focal liver  lesions are identified. Gallbladder is absent. There is no appreciable biliary duct dilatation. Pancreas: Pancreas appears mildly atrophic. There is chronic pancreatic duct dilatation. No pancreatic mass or inflammatory focus is evident by CT. Spleen: No splenic lesions are appreciable. Adrenals/Urinary Tract: Adrenals show mild hypertrophy, stable. No focal adrenal lesions are evident. There is no hydronephrosis or renal mass on either side. There are areas of scarring in the right kidney, stable. No renal or ureteral  calculus is evident. The urinary bladder is midline with wall thickness within normal limits. Stomach/Bowel: Rectum is mildly distended with stool and air. There is no bowel wall or mesenteric thickening. No bowel obstruction. No free air or portal venous air. There is moderate stool throughout the colon. No bowel pneumatosis. Vascular/Lymphatic: There is extensive atherosclerotic calcification throughout the aorta and iliac arteries without frank aneurysm. There is moderate calcification at the origins of the mesenteric vessels. Note that the aorta is tortuous. Reproductive:  No pelvic mass or pelvic fluid collection. Other: There is no appreciable appendiceal inflammation. No abscess or ascites in the abdomen or pelvis. There is a small ventral hernia containing only fat. Musculoskeletal: There is marked lumbar levoscoliosis. There is extensive osteoarthritic change throughout the lumbar spine. There is a total hip prosthesis on the right. There is moderate osteoarthritic change in the left hip joint. There are no blastic or lytic bone lesions. No intramuscular or abdominal wall lesions. IMPRESSION: Question early pneumonia right middle lobe. Extensive air in the biliary ductal system consistent with prior sphincterotomy. No focal liver lesions evident. Gallbladder absent. Chronic pancreatic duct dilatation.  No pancreatic mass. Small hiatal hernia. Extensive atherosclerotic calcification. Advanced arthropathy in the lower thoracic and lumbar spine. No renal or ureteral calculus. No hydronephrosis. No bowel obstruction. No abscess. Small ventral hernia containing only fat. Electronically Signed   By: Bretta Bang III M.D.   On: 06/13/2015 18:13    Lab Results: Basic Metabolic Panel:  Recent Labs  19/14/78 0533 06/15/15 0605  NA 139 138  K 4.1 3.3*  CL 96* 97*  CO2 36* 34*  GLUCOSE 96 92  BUN 14 10  CREATININE 1.08* 0.82  CALCIUM 8.9 8.5*   Liver Function Tests:  Recent Labs   06/14/15 0533 06/15/15 0605  AST 19 24  ALT 12* 16  ALKPHOS 89 87  BILITOT 0.7 0.5  PROT 6.5 6.2*  ALBUMIN 3.2* 3.0*     CBC:  Recent Labs  06/13/15 1520 06/14/15 0533 06/15/15 0605  WBC 22.4* 15.0* 9.0  NEUTROABS 19.0*  --   --   HGB 16.6* 13.9 12.6  HCT 47.2* 39.9 37.5  MCV 79.7 80.3 80.6  PLT 271 236 222    Recent Results (from the past 240 hour(s))  Culture, blood (Routine X 2) w Reflex to ID Panel     Status: None (Preliminary result)   Collection Time: 06/13/15  8:05 PM  Result Value Ref Range Status   Specimen Description BLOOD LEFT ARM  Final   Special Requests BOTTLES DRAWN AEROBIC AND ANAEROBIC 6CC  Final   Culture NO GROWTH 2 DAYS  Final   Report Status PENDING  Incomplete  Culture, blood (Routine X 2) w Reflex to ID Panel     Status: None (Preliminary result)   Collection Time: 06/13/15  8:06 PM  Result Value Ref Range Status   Specimen Description BLOOD RIGHT HAND  Final   Special Requests BOTTLES DRAWN AEROBIC AND ANAEROBIC 6CC  Final   Culture NO GROWTH 2 DAYS  Final   Report Status PENDING  Incomplete     Hospital Course:   This is a 82 years old who came to ER due to RUQ and rt side chest pain was found to have have pneumonia. Her chest x-ray showed bandlike opacity on the rt side. She had also leucocytosis and fever. Patient was started on combination antibiotics. Patient significantly improved with the treatment. Her fever resolved and leucocytosis improved. She was also evaluated by GI who advised to continue current treatment.  Discharge Exam: Blood pressure 120/60, pulse 70, temperature 98.2 F (36.8 C), temperature source Oral, resp. rate 20, height 5' (1.524 m), weight 55.792 kg (123 lb), SpO2 93 %.    Disposition:  home        Follow-up Information    Follow up with [facility], MD In 2 weeks.   Specialty:  Internal Medicine   Contact information:   [address] [place] [address] [phone]        Signed: Avon Gully   06/16/2015, 8:25 AM

## 2015-06-16 NOTE — Progress Notes (Signed)
Patient states understanding of discharge instructions, prescriptions given 

## 2015-06-16 NOTE — Care Management Note (Signed)
Case Management Note  Patient Details  Name: Catalina Gravelretta A Elpers MRN: [id number] Date of Birth: 1920/08/19  Subjective/Objective:        Spoke with patient who is alert and oriented from home alone. Has support of her niece an her neighbor who help her to her appointments. Patient expresses not needs. Neighbor helps her dress each day and bathe. Uses a walker at home and has handicapped accessable bathroom.  Stated her niece drives her to appointments.  Denies issues with medications.  No cm needs identified.         Action/Plan:Home with self care.   Expected Discharge Date:                  Expected Discharge Plan:  Home/Self Care  In-House Referral:     Discharge planning Services  CM Consult  Post Acute Care Choice:    Choice offered to:  NA  DME Arranged:  N/A DME Agency:  NA  HH Arranged:  NA HH Agency:  NA  Status of Service:  Completed, signed off  Medicare Important Message Given:    Date Medicare IM Given:    Medicare IM give by:    Date Additional Medicare IM Given:    Additional Medicare Important Message give by:     If discussed at Long Length of Stay Meetings, dates discussed:    Additional Comments:  Adonis HugueninBerkhead, Carsen Leaf L, RN 06/16/2015, 9:03 AM

## 2015-06-18 LAB — CULTURE, BLOOD (ROUTINE X 2)
CULTURE: NO GROWTH
Culture: NO GROWTH

## 2015-06-21 ENCOUNTER — Ambulatory Visit (INDEPENDENT_AMBULATORY_CARE_PROVIDER_SITE_OTHER): Payer: Medicare Other | Admitting: Orthopaedic Surgery

## 2015-06-21 VITALS — BP 126/66 | HR 65 | Temp 97.7°F | Ht 60.0 in | Wt 123.0 lb

## 2015-06-21 DIAGNOSIS — M25562 Pain in left knee: Secondary | ICD-10-CM

## 2015-06-21 DIAGNOSIS — M25561 Pain in right knee: Secondary | ICD-10-CM

## 2015-06-21 NOTE — Progress Notes (Signed)
Patient ID: Catalina GravelAretta A Bishop, female   DOB: Jan 03, 1921, 82 y.o.   MRN: [id number]  CC: Both of my knees are hurting. I would like an injection in both knees.  The patient has had chronic pain and tenderness of both knees for some time.  Injections help.  There is no locking or giving way of the knee.  There is no new trauma. There is no redness or signs of infections.  The knees have a mild effusion and some crepitus.  There is no redness or signs of recent trauma.  Impression:  Chronic pain of the both knees  Return:  One month  PROCEDURE NOTE:  The patient requests injections of the left knee , verbal consent was obtained.  The left knee was prepped appropriately after time out was performed.   Sterile technique was observed and injection of 1 cc of Depo-Medrol 40 mg with several cc's of plain xylocaine. Anesthesia was provided by ethyl chloride and a 20-gauge needle was used to inject the knee area. The injection was tolerated well.  A band aid dressing was applied.  The patient was advised to apply ice later today and tomorrow to the injection sight as needed.  PROCEDURE NOTE:  The patient requests injections of the right knee , verbal consent was obtained.  The right knee was prepped appropriately after time out was performed.   Sterile technique was observed and injection of 1 cc of Depo-Medrol 40 mg with several cc's of plain xylocaine. Anesthesia was provided by ethyl chloride and a 20-gauge needle was used to inject the knee area. The injection was tolerated well.  A band aid dressing was applied.  The patient was advised to apply ice later today and tomorrow to the injection sight as needed.

## 2015-06-28 DIAGNOSIS — N182 Chronic kidney disease, stage 2 (mild): Secondary | ICD-10-CM | POA: Diagnosis not present

## 2015-06-28 DIAGNOSIS — I10 Essential (primary) hypertension: Secondary | ICD-10-CM | POA: Diagnosis not present

## 2015-06-28 DIAGNOSIS — J189 Pneumonia, unspecified organism: Secondary | ICD-10-CM | POA: Diagnosis not present

## 2015-06-28 DIAGNOSIS — K219 Gastro-esophageal reflux disease without esophagitis: Secondary | ICD-10-CM | POA: Diagnosis not present

## 2015-07-19 ENCOUNTER — Encounter: Payer: Self-pay | Admitting: Orthopaedic Surgery

## 2015-07-19 ENCOUNTER — Ambulatory Visit (INDEPENDENT_AMBULATORY_CARE_PROVIDER_SITE_OTHER): Payer: Medicare Other | Admitting: Orthopaedic Surgery

## 2015-07-19 VITALS — BP 130/65 | HR 62 | Temp 97.5°F | Ht 60.0 in | Wt 123.0 lb

## 2015-07-19 DIAGNOSIS — M25561 Pain in right knee: Secondary | ICD-10-CM | POA: Diagnosis not present

## 2015-07-19 DIAGNOSIS — M25562 Pain in left knee: Secondary | ICD-10-CM | POA: Diagnosis not present

## 2015-07-19 DIAGNOSIS — I10 Essential (primary) hypertension: Secondary | ICD-10-CM

## 2015-07-19 NOTE — Progress Notes (Signed)
CC: Both of my knees are hurting. I would like an injection in both knees.  The patient has had chronic pain and tenderness of both knees for some time.  Injections help.  There is no locking or giving way of the knee.  There is no new trauma. There is no redness or signs of infections.  The knees have a mild effusion and some crepitus.  There is no redness or signs of recent trauma.  Impression:  Chronic pain of the both knees  Return:  One month  PROCEDURE NOTE:  The patient requests injections of the left knee , verbal consent was obtained.  The left knee was prepped appropriately after time out was performed.   Sterile technique was observed and injection of 1 cc of Depo-Medrol 40 mg with several cc's of plain xylocaine. Anesthesia was provided by ethyl chloride and a 20-gauge needle was used to inject the knee area. The injection was tolerated well.  A band aid dressing was applied.  The patient was advised to apply ice later today and tomorrow to the injection sight as needed.  PROCEDURE NOTE:  The patient requests injections of the right knee , verbal consent was obtained.  The right knee was prepped appropriately after time out was performed.   Sterile technique was observed and injection of 1 cc of Depo-Medrol 40 mg with several cc's of plain xylocaine. Anesthesia was provided by ethyl chloride and a 20-gauge needle was used to inject the knee area. The injection was tolerated well.  A band aid dressing was applied.  The patient was advised to apply ice later today and tomorrow to the injection sight as needed.   

## 2015-07-21 ENCOUNTER — Ambulatory Visit (INDEPENDENT_AMBULATORY_CARE_PROVIDER_SITE_OTHER): Payer: Medicare Other | Admitting: Otolaryngology

## 2015-07-21 DIAGNOSIS — H903 Sensorineural hearing loss, bilateral: Secondary | ICD-10-CM | POA: Diagnosis not present

## 2015-07-21 DIAGNOSIS — H6123 Impacted cerumen, bilateral: Secondary | ICD-10-CM

## 2015-08-16 ENCOUNTER — Ambulatory Visit (INDEPENDENT_AMBULATORY_CARE_PROVIDER_SITE_OTHER): Payer: Medicare Other | Admitting: Orthopaedic Surgery

## 2015-08-16 ENCOUNTER — Encounter: Payer: Self-pay | Admitting: Orthopaedic Surgery

## 2015-08-16 VITALS — BP 138/69 | HR 88 | Temp 97.7°F | Ht <= 58 in | Wt 121.8 lb

## 2015-08-16 DIAGNOSIS — M25561 Pain in right knee: Secondary | ICD-10-CM | POA: Diagnosis not present

## 2015-08-16 DIAGNOSIS — M109 Gout, unspecified: Secondary | ICD-10-CM | POA: Diagnosis not present

## 2015-08-16 DIAGNOSIS — M25562 Pain in left knee: Secondary | ICD-10-CM | POA: Diagnosis not present

## 2015-08-16 DIAGNOSIS — I10 Essential (primary) hypertension: Secondary | ICD-10-CM

## 2015-08-16 NOTE — Progress Notes (Signed)
CC: Both of my knees are hurting. I would like an injection in both knees.  The patient has had chronic pain and tenderness of both knees for some time.  Injections help.  There is no locking or giving way of the knee.  There is no new trauma. There is no redness or signs of infections.  The knees have a mild effusion and some crepitus.  There is no redness or signs of recent trauma.  Impression:  Chronic pain of the both knees  Return:  One month.  PROCEDURE NOTE:  The patient requests injections of the right knee , verbal consent was obtained.  The right knee was prepped appropriately after time out was performed.   Sterile technique was observed and injection of 1 cc of Depo-Medrol 40 mg with several cc's of plain xylocaine. Anesthesia was provided by ethyl chloride and a 20-gauge needle was used to inject the knee area. The injection was tolerated well.  A band aid dressing was applied.  The patient was advised to apply ice later today and tomorrow to the injection sight as needed.  PROCEDURE NOTE:  The patient requests injections of the left knee , verbal consent was obtained.  The left knee was prepped appropriately after time out was performed.   Sterile technique was observed and injection of 1 cc of Depo-Medrol 40 mg with several cc's of plain xylocaine. Anesthesia was provided by ethyl chloride and a 20-gauge needle was used to inject the knee area. The injection was tolerated well.  A band aid dressing was applied.  The patient was advised to apply ice later today and tomorrow to the injection sight as needed.  Electronically Signed Darreld McleanWayne Brandilynn Taormina, MD 6/13/20179:29 AM

## 2015-08-26 ENCOUNTER — Emergency Department (HOSPITAL_COMMUNITY): Payer: Medicare Other

## 2015-08-26 ENCOUNTER — Encounter (HOSPITAL_COMMUNITY): Payer: Self-pay | Admitting: Emergency Medicine

## 2015-08-26 ENCOUNTER — Emergency Department (HOSPITAL_COMMUNITY)
Admission: EM | Admit: 2015-08-26 | Discharge: 2015-08-26 | Disposition: A | Discharge status: Home / Self Care | Payer: Medicare Other | Attending: Emergency Medicine | Admitting: Emergency Medicine

## 2015-08-26 DIAGNOSIS — Z87891 Personal history of nicotine dependence: Secondary | ICD-10-CM | POA: Diagnosis not present

## 2015-08-26 DIAGNOSIS — I10 Essential (primary) hypertension: Secondary | ICD-10-CM | POA: Insufficient documentation

## 2015-08-26 DIAGNOSIS — M79605 Pain in left leg: Secondary | ICD-10-CM | POA: Diagnosis not present

## 2015-08-26 DIAGNOSIS — M1990 Unspecified osteoarthritis, unspecified site: Secondary | ICD-10-CM | POA: Diagnosis not present

## 2015-08-26 DIAGNOSIS — R252 Cramp and spasm: Secondary | ICD-10-CM

## 2015-08-26 DIAGNOSIS — M79604 Pain in right leg: Secondary | ICD-10-CM | POA: Diagnosis present

## 2015-08-26 HISTORY — DX: Dorsalgia, unspecified: M54.9

## 2015-08-26 HISTORY — DX: Pain in right knee: M25.561

## 2015-08-26 HISTORY — DX: Carpal tunnel syndrome, bilateral upper limbs: G56.03

## 2015-08-26 HISTORY — DX: Pain in left knee: M25.562

## 2015-08-26 HISTORY — DX: Other chronic pain: G89.29

## 2015-08-26 LAB — I-STAT CHEM 8, ED
BUN: 3 mg/dL — AB (ref 6–20)
CALCIUM ION: 1.21 mmol/L (ref 1.13–1.30)
CREATININE: 0.8 mg/dL (ref 0.44–1.00)
Chloride: 106 mmol/L (ref 101–111)
Glucose, Bld: 106 mg/dL — ABNORMAL HIGH (ref 65–99)
HCT: 49 % — ABNORMAL HIGH (ref 36.0–46.0)
Hemoglobin: 16.7 g/dL — ABNORMAL HIGH (ref 12.0–15.0)
Potassium: 4.2 mmol/L (ref 3.5–5.1)
SODIUM: 146 mmol/L — AB (ref 135–145)
TCO2: 28 mmol/L (ref 0–100)

## 2015-08-26 LAB — CK: Total CK: 81 U/L (ref 38–234)

## 2015-08-26 MED ORDER — TRAMADOL-ACETAMINOPHEN 37.5-325 MG PO TABS
1.0000 | ORAL_TABLET | Freq: Once | ORAL | Status: AC
  Administered 2015-08-26: 1 via ORAL
  Filled 2015-08-26: qty 1

## 2015-08-26 NOTE — ED Notes (Signed)
Pt was talking and forgot to sign for d/c instructions.  Verbalized understanding.

## 2015-08-26 NOTE — Discharge Instructions (Signed)
Take your usual prescriptions as previously directed.  Apply moist heat or ice to the area(s) of discomfort, for 15 minutes at a time, several times per day for the next few days.  Do not fall asleep on a heating or ice pack.  Call your regular medical doctor today to schedule a follow up appointment in the next 3 days.  Return to the Emergency Department immediately if worsening. ° °

## 2015-08-26 NOTE — ED Provider Notes (Signed)
CSN: [id number]     Arrival date & time 08/26/15  0746 History   First MD Initiated Contact with Patient 08/26/15 0800     Chief Complaint  Patient presents with  . leg cramps       HPI  Pt was seen at 0800. Per pt and her family, c/o gradual onset and persistence of constant bilateral legs "cramping" for the past 2 days. Pt endorses hx of same. Pt concerned she is "dehydrated and that's why my legs are cramping." Denies incont/retention of bowel or bladder, no saddle anesthesia, no focal motor weakness, no tingling/numbness in extremities, no fevers, no injury, no abd pain.    Past Medical History  Diagnosis Date  . Gout   . Osteoarthritis   . Leg cramps   . Choledocholithiasis 3 2014 and 5 2014, Dec 2014    S/P ERCP and sphincterotomy x3  . Hypertension   . Bilateral chronic knee pain   . Carpal tunnel syndrome, bilateral   . Chronic back pain    Past Surgical History  Procedure Laterality Date  . Cholecystectomy    . Appendectomy    . Cataract extraction Bilateral   . Wrist arthroplasty Right   . Right femur      metal plate  . Breast biopsy Left   . Ercp  05/23/2012    RMR: juxta-ampullary duodenal diverticulum, markedly dilated biliary tree with choledocholithiasis, s/p sphincterotomy with biliary sphincterotomy balloon dilation, balloon and basket stone extraction, stent placement  . Removal of stones  05/23/2012    Procedure: BALLOON AND BASKET REMOVAL OF STONES;  Surgeon: Corbin Ade, MD;  Location: [facility];  Service: Endoscopy;;  . Sphincterotomy  05/23/2012    Procedure: Dennison Mascot;  Surgeon: Corbin Ade, MD;  Location: [facility];  Service: Endoscopy;;  . Biliary stent placement  05/23/2012    Procedure: BILIARY STENT PLACEMENT;  Surgeon: Corbin Ade, MD;  Location: [facility];  Service: Endoscopy;;  . Balloon dilation  05/23/2012    Procedure: AMPULLARY BALLOON DILATION;  Surgeon: Corbin Ade, MD;  Location: [facility];  Service: Endoscopy;;  . Ercp N/A 07/24/2012     GMW:[id number] duodenal diverticulum/s/p dilation sphincterotomy site s/p basket stone extraction s/p cholangioscopy with holmium lithotripsy of common duct stones  . Spyglass cholangioscopy N/A 07/24/2012    Procedure: VQQVZDGL CHOLANGIOSCOPY;  Surgeon: Corbin Ade, MD;  Location: [facility];  Service: Endoscopy;  Laterality: N/A;  . Spyglass lithotripsy N/A 07/24/2012    Procedure: OVFIEPPI LITHOTRIPSY;  Surgeon: Corbin Ade, MD;  Location: [facility];  Service: Endoscopy;  Laterality: N/A;  . Removal of stones N/A 07/24/2012    Procedure: REMOVAL OF STONES;  Surgeon: Corbin Ade, MD;  Location: [facility];  Service: Endoscopy;  Laterality: N/A;  . Balloon dilation N/A 07/24/2012    Procedure: BALLOON DILATION; Balloon basket dredge and balloon dialate sphincterotomy;  Surgeon: Corbin Ade, MD;  Location: [facility];  Service: Endoscopy;  Laterality: N/A;  . Holmium laser application N/A 07/24/2012    Procedure: HOLMIUM LASER APPLICATION;  Surgeon: Corbin Ade, MD;  Location: [facility];  Service: Endoscopy;  Laterality: N/A;  . Ercp N/A 02/17/2013    Dr. Jena Gauss: markedly dilated biliary tree with choledocholithiasis, s/p sphincterotomy and extraction  . Stone extraction with basket N/A 02/17/2013    Procedure: STONE EXTRACTION WITH BASKET and BALLOON;  Surgeon: Corbin Ade, MD;  Location: [facility];  Service: Endoscopy;  Laterality: N/A;  . Balloon dilation N/A  02/17/2013    Procedure: BALLOON DILATION;  Surgeon: Corbin Adeobert M Rourk, MD;  Location: [facility];  Service: Endoscopy;  Laterality: N/A;  . Carpal tunnel release Right   . Choledochoenterostomy N/A 11/09/2014    Procedure: CHOLEDOCHODUODENOSTOMY;  Surgeon: Franky MachoMark Jenkins, MD;  Location: [facility];  Service: General;  Laterality: N/A;   Family History  Problem Relation Age of Onset  . Stroke Father   . CAD Mother   . Cancer Son     ?metastatic  . Colon cancer Neg Hx    Social History  Substance Use Topics  . Smoking status: Former Smoker --  0.25 packs/day for 15 years    Types: Cigarettes    Quit date: 07/17/1988  . Smokeless tobacco: Never Used  . Alcohol Use: No   OB History    Gravida Para Term Preterm AB TAB SAB Ectopic Multiple Living   1 1 1        0     Review of Systems ROS: Statement: All systems negative except as marked or noted in the HPI; Constitutional: Negative for fever and chills. ; ; Eyes: Negative for eye pain, redness and discharge. ; ; ENMT: Negative for ear pain, hoarseness, nasal congestion, sinus pressure and sore throat. ; ; Cardiovascular: Negative for chest pain, palpitations, diaphoresis, dyspnea and peripheral edema. ; ; Respiratory: Negative for cough, wheezing and stridor. ; ; Gastrointestinal: Negative for nausea, vomiting, diarrhea, abdominal pain, blood in stool, hematemesis, jaundice and rectal bleeding. . ; ; Genitourinary: Negative for dysuria, flank pain and hematuria. ; ; Musculoskeletal: +muscles cramps. Negative for back pain and neck pain. Negative for swelling and trauma.; ; Skin: Negative for pruritus, rash, abrasions, blisters, bruising and skin lesion.; ; Neuro: Negative for headache, lightheadedness and neck stiffness. Negative for weakness, altered level of consciousness, altered mental status, extremity weakness, paresthesias, involuntary movement, seizure and syncope.      Allergies  Review of patient's allergies indicates no known allergies.  Home Medications   Prior to Admission medications   Medication Sig Start Date End Date Taking? Authorizing Provider  allopurinol (ZYLOPRIM) 100 MG tablet Take 100 mg by mouth daily.    Historical Provider, MD  amoxicillin-clavulanate (AUGMENTIN) 500-125 MG tablet Take 1 tablet (500 mg total) by mouth 3 (three) times daily. 06/16/15   Avon Gullyesfaye Fanta, MD  felodipine (PLENDIL) 10 MG 24 hr tablet Take 10 mg by mouth daily.    Historical Provider, MD  fentaNYL (DURAGESIC - DOSED MCG/HR) 50 MCG/HR Place 50 mcg onto the skin every 3 (three) days.     Historical Provider, MD  furosemide (LASIX) 40 MG tablet Take 60 mg by mouth 2 (two) times daily.    Historical Provider, MD  gabapentin (NEURONTIN) 100 MG capsule TAKE 3 CAPSULES BY MOUTH THREE TIMES DAILY. 11/18/14   Anson FretAntonia B Ahern, MD  metolazone (ZAROXOLYN) 2.5 MG tablet Take 2.5 mg by mouth daily as needed (for fluid).  08/10/13   Historical Provider, MD  pantoprazole (PROTONIX) 40 MG tablet Take 1 tablet (40 mg total) by mouth daily before breakfast. 06/16/15   Avon Gullyesfaye Fanta, MD  polyethylene glycol (MIRALAX / GLYCOLAX) packet Take 17 g by mouth daily as needed for moderate constipation.    Historical Provider, MD  potassium chloride SA (K-DUR,KLOR-CON) 20 MEQ tablet Take 40 mEq by mouth daily.     Historical Provider, MD  traMADol-acetaminophen (ULTRACET) 37.5-325 MG per tablet Take 1 tablet by mouth 3 (three) times daily as needed for pain.    Historical  Provider, MD   BP 170/66 mmHg  Pulse 82  Temp(Src) 97.9 F (36.6 C) (Oral)  Resp 16  SpO2 100% Physical Exam  0805: Physical examination:  Nursing notes reviewed; Vital signs and O2 SAT reviewed;  Constitutional: Well developed, Well nourished, Well hydrated, In no acute distress; Head:  Normocephalic, atraumatic; Eyes: EOMI, PERRL, No scleral icterus; ENMT: Mouth and pharynx normal, Mucous membranes moist; Neck: Supple, Full range of motion, No lymphadenopathy; Cardiovascular: Regular rate and rhythm, No gallop; Respiratory: Breath sounds clear & equal bilaterally, No wheezes.  Speaking full sentences with ease, Normal respiratory effort/excursion; Chest: Nontender, Movement normal; Abdomen: Soft, Nontender, Nondistended, Normal bowel sounds; Genitourinary: No CVA tenderness; Spine:  No midline CS, TS, LS tenderness.;;  Extremities: Pulses normal, Pelvis stable. Bilat LE's muscles compartments soft and NT to palp, no edema, no joints tenderness. No deformity. No edema, No calf tenderness, edema or asymmetry.; Neuro: AA&Ox3, Major CN grossly  intact.  Speech clear. DTR 2/4 equal bilat LE's. Strength 5/5 equal bilat UE's and LE's. No gross focal motor or sensory deficits in extremities.; Skin: Color normal, Warm, Dry.   ED Course  Procedures (including critical care time) Labs Review  Imaging Review  I have personally reviewed and evaluated these images and lab results as part of my medical decision-making.   EKG Interpretation None      MDM  MDM Reviewed: previous chart, nursing note and vitals Reviewed previous: labs Interpretation: labs and ultrasound   Results for orders placed or performed during the hospital encounter of 08/26/15  CK  Result Value Ref Range   Total CK 81 38 - 234 U/L  I-stat Chem 8, ED  Result Value Ref Range   Sodium 146 (H) 135 - 145 mmol/L   Potassium 4.2 3.5 - 5.1 mmol/L   Chloride 106 101 - 111 mmol/L   BUN 3 (L) 6 - 20 mg/dL   Creatinine, Ser 8.29 0.44 - 1.00 mg/dL   Glucose, Bld 562 (H) 65 - 99 mg/dL   Calcium, Ion 1.30 8.65 - 1.30 mmol/L   TCO2 28 0 - 100 mmol/L   Hemoglobin 16.7 (H) 12.0 - 15.0 g/dL   HCT 78.4 (H) 69.6 - 29.5 %   US Venous Img Lower Bilateral 08/26/2015  CLINICAL DATA:  Bilateral lower extremity cramping for 2 days. EXAM: BILATERAL LOWER EXTREMITY VENOUS DOPPLER ULTRASOUND TECHNIQUE: Gray-scale sonography with graded compression, as well as color Doppler and duplex ultrasound were performed to evaluate the lower extremity deep venous systems from the level of the common femoral vein and including the common femoral, femoral, profunda femoral, popliteal and calf veins including the posterior tibial, peroneal and gastrocnemius veins when visible. The superficial great saphenous vein was also interrogated. Spectral Doppler was utilized to evaluate flow at rest and with distal augmentation maneuvers in the common femoral, femoral and popliteal veins. COMPARISON:  None. FINDINGS: RIGHT LOWER EXTREMITY Common Femoral Vein: No evidence of thrombus. Normal compressibility,  respiratory phasicity and response to augmentation. Saphenofemoral Junction: No evidence of thrombus. Normal compressibility and flow on color Doppler imaging. Profunda Femoral Vein: No evidence of thrombus. Normal compressibility and flow on color Doppler imaging. Femoral Vein: No evidence of thrombus. Normal compressibility, respiratory phasicity and response to augmentation. Popliteal Vein: No evidence of thrombus. Normal compressibility, respiratory phasicity and response to augmentation. Calf Veins: Suboptimally evaluated. Superficial Great Saphenous Vein: No evidence of thrombus. Normal compressibility and flow on color Doppler imaging. Other Findings:  None. LEFT LOWER EXTREMITY Common Femoral Vein: No  evidence of thrombus. Normal compressibility, respiratory phasicity and response to augmentation. Saphenofemoral Junction: No evidence of thrombus. Normal compressibility and flow on color Doppler imaging. Profunda Femoral Vein: No evidence of thrombus. Normal compressibility and flow on color Doppler imaging. Femoral Vein: No evidence of thrombus. Normal compressibility, respiratory phasicity and response to augmentation. Popliteal Vein: No evidence of thrombus. Normal compressibility, respiratory phasicity and response to augmentation. Calf Veins: Suboptimally evaluated. Superficial Great Saphenous Vein: No evidence of thrombus. Normal compressibility and flow on color Doppler imaging. Other Findings:  None. IMPRESSION: No evidence of deep venous thrombosis. Degraded evaluation of the calf veins secondary to patient's discomfort. Electronically Signed   By: Jeronimo GreavesKyle  Talbot M.D.   On: 08/26/2015 09:37     1020:  Pt now states she did not take her pain meds this morning; will dose here.  Pt endorses hx of same "legs cramps," denies any change in her usual symptoms. Vasc US as above: doubt bilateral DVT at this time. Workup otherwise reassuring. Pt has no LBP or abd pain on exam, has bilat LE's strong pulses  and does not have neuro deficits; doubt vascular, LS or abd causes for symptoms. Tx symptomatically, f/u PMD. Dx and testing d/w pt and family.  Questions answered.  Verb understanding, agreeable to d/c home with outpt f/u.    Samuel JesterKathleen Kewana Sanon, DO 08/31/15 1243

## 2015-08-26 NOTE — ED Notes (Signed)
having leg cramps since yesterday.  Rates pain 8/10.  Denies any other pain or discomfort.  Denies n/v/d.

## 2015-09-15 ENCOUNTER — Encounter: Payer: Self-pay | Admitting: Orthopaedic Surgery

## 2015-09-15 ENCOUNTER — Ambulatory Visit (INDEPENDENT_AMBULATORY_CARE_PROVIDER_SITE_OTHER): Payer: Medicare Other | Admitting: Orthopaedic Surgery

## 2015-09-15 VITALS — BP 141/59 | HR 81 | Temp 97.3°F | Ht <= 58 in | Wt 123.0 lb

## 2015-09-15 DIAGNOSIS — M109 Gout, unspecified: Secondary | ICD-10-CM

## 2015-09-15 DIAGNOSIS — M25561 Pain in right knee: Secondary | ICD-10-CM

## 2015-09-15 DIAGNOSIS — I10 Essential (primary) hypertension: Secondary | ICD-10-CM

## 2015-09-15 DIAGNOSIS — M25562 Pain in left knee: Secondary | ICD-10-CM

## 2015-09-15 NOTE — Progress Notes (Signed)
CC: Both of my knees are hurting. I would like an injection in both knees.  The patient has had chronic pain and tenderness of both knees for some time.  Injections help.  There is no locking or giving way of the knee.  There is no new trauma. There is no redness or signs of infections.  The knees have a mild effusion and some crepitus.  There is no redness or signs of recent trauma.  Right knee ROM is 0-95 and left knee ROM is 0-90.  Impression:  Chronic pain of the both knees  Return:  3 months  PROCEDURE NOTE:  The patient requests injections of both knees, verbal consent was obtained.  The left and right knee were individually prepped appropriately after time out was performed.   Sterile technique was observed and injection of 1 cc of Depo-Medrol 40 mg with several cc's of plain xylocaine. Anesthesia was provided by ethyl chloride and a 20-gauge needle was used to inject each knee area. The injections were tolerated well.  A band aid dressing was applied.  The patient was advised to apply ice later today and tomorrow to the injection sight as needed.  Electronically Signed Darreld McleanWayne Posie Lillibridge, MD 7/13/20179:30 AM

## 2015-09-27 DIAGNOSIS — Z0000 Encounter for general adult medical examination without abnormal findings: Secondary | ICD-10-CM | POA: Diagnosis not present

## 2015-09-27 DIAGNOSIS — M179 Osteoarthritis of knee, unspecified: Secondary | ICD-10-CM | POA: Diagnosis not present

## 2015-09-27 DIAGNOSIS — N182 Chronic kidney disease, stage 2 (mild): Secondary | ICD-10-CM | POA: Diagnosis not present

## 2015-09-27 DIAGNOSIS — I10 Essential (primary) hypertension: Secondary | ICD-10-CM | POA: Diagnosis not present

## 2015-09-27 DIAGNOSIS — M5489 Other dorsalgia: Secondary | ICD-10-CM | POA: Diagnosis not present

## 2015-10-13 ENCOUNTER — Encounter: Payer: Self-pay | Admitting: Orthopaedic Surgery

## 2015-10-13 ENCOUNTER — Ambulatory Visit (INDEPENDENT_AMBULATORY_CARE_PROVIDER_SITE_OTHER): Payer: Medicare Other | Admitting: Orthopaedic Surgery

## 2015-10-13 VITALS — BP 144/68 | HR 84 | Temp 97.7°F | Ht <= 58 in | Wt 123.0 lb

## 2015-10-13 DIAGNOSIS — M25562 Pain in left knee: Secondary | ICD-10-CM | POA: Diagnosis not present

## 2015-10-13 DIAGNOSIS — M109 Gout, unspecified: Secondary | ICD-10-CM

## 2015-10-13 DIAGNOSIS — M25561 Pain in right knee: Secondary | ICD-10-CM

## 2015-10-13 DIAGNOSIS — I10 Essential (primary) hypertension: Secondary | ICD-10-CM

## 2015-10-13 NOTE — Progress Notes (Signed)
CC: Both of my knees are hurting. I would like an injection in both knees.  The patient has had chronic pain and tenderness of both knees for some time.  Injections help.  There is no locking or giving way of the knee.  There is no new trauma. There is no redness or signs of infections.  The knees have a mild effusion and some crepitus.  There is no redness or signs of recent trauma.  Right knee ROM is 0-95 and left knee ROM is 0-90.  Impression:  Chronic pain of the both knees  Return:  1 month  PROCEDURE NOTE:  The patient requests injections of both knees, verbal consent was obtained.  The left and right knee were individually prepped appropriately after time out was performed.   Sterile technique was observed and injection of 1 cc of Depo-Medrol 40 mg with several cc's of plain xylocaine. Anesthesia was provided by ethyl chloride and a 20-gauge needle was used to inject each knee area. The injections were tolerated well.  A band aid dressing was applied.  The patient was advised to apply ice later today and tomorrow to the injection sight as needed.  Electronically Signed Darreld McleanWayne Deddrick Saindon, MD 8/10/20179:42 AM

## 2015-11-10 ENCOUNTER — Ambulatory Visit (INDEPENDENT_AMBULATORY_CARE_PROVIDER_SITE_OTHER): Payer: Medicare Other | Admitting: Orthopaedic Surgery

## 2015-11-10 ENCOUNTER — Encounter: Payer: Self-pay | Admitting: Orthopaedic Surgery

## 2015-11-10 VITALS — BP 120/63 | HR 87 | Temp 97.2°F | Ht <= 58 in | Wt 121.0 lb

## 2015-11-10 DIAGNOSIS — M25561 Pain in right knee: Secondary | ICD-10-CM

## 2015-11-10 DIAGNOSIS — M25562 Pain in left knee: Secondary | ICD-10-CM | POA: Diagnosis not present

## 2015-11-10 DIAGNOSIS — I10 Essential (primary) hypertension: Secondary | ICD-10-CM

## 2015-11-10 NOTE — Progress Notes (Signed)
CC: Both of my knees are hurting. I would like an injection in both knees.  The patient has had chronic pain and tenderness of both knees for some time.  Injections help.  There is no locking or giving way of the knee.  There is no new trauma. There is no redness or signs of infections.  The knees have a mild effusion and some crepitus.  There is no redness or signs of recent trauma.  Right knee ROM is 0-100 and left knee ROM is 0-105.  Impression:  Chronic pain of the both knees  Return:  1 month  PROCEDURE NOTE:  The patient requests injections of both knees, verbal consent was obtained.  The left and right knee were individually prepped appropriately after time out was performed.   Sterile technique was observed and injection of 1 cc of Depo-Medrol 40 mg with several cc's of plain xylocaine. Anesthesia was provided by ethyl chloride and a 20-gauge needle was used to inject each knee area. The injections were tolerated well.  A band aid dressing was applied.  The patient was advised to apply ice later today and tomorrow to the injection sight as needed.  Electronically Signed Darreld McleanWayne Ayano Douthitt, MD 9/7/20179:49 AM

## 2015-12-01 ENCOUNTER — Ambulatory Visit (INDEPENDENT_AMBULATORY_CARE_PROVIDER_SITE_OTHER): Payer: Medicare Other | Admitting: Orthopaedic Surgery

## 2015-12-01 DIAGNOSIS — M25562 Pain in left knee: Secondary | ICD-10-CM

## 2015-12-01 DIAGNOSIS — I10 Essential (primary) hypertension: Secondary | ICD-10-CM

## 2015-12-01 DIAGNOSIS — M25561 Pain in right knee: Secondary | ICD-10-CM | POA: Diagnosis not present

## 2015-12-01 NOTE — Progress Notes (Signed)
CC: Both of my knees are hurting. I would like an injection in both knees.  The patient has had chronic pain and tenderness of both knees for some time.  Injections help.  There is no locking or giving way of the knee.  There is no new trauma. There is no redness or signs of infections.  The knees have a mild effusion and some crepitus.  There is no redness or signs of recent trauma.  Right knee ROM is 0-100 and left knee ROM is 0-105.  Impression:  Chronic pain of the both knees  Return:  1 month  PROCEDURE NOTE:  The patient requests injections of both knees, verbal consent was obtained.  The left and right knee were individually prepped appropriately after time out was performed.   Sterile technique was observed and injection of 1 cc of Depo-Medrol 40 mg with several cc's of plain xylocaine. Anesthesia was provided by ethyl chloride and a 20-gauge needle was used to inject each knee area. The injections were tolerated well.  A band aid dressing was applied.  The patient was advised to apply ice later today and tomorrow to the injection sight as needed.  Electronically Signed Darreld McleanWayne Eliud Polo, MD 9/28/20178:55 AM

## 2015-12-12 DIAGNOSIS — Z23 Encounter for immunization: Secondary | ICD-10-CM | POA: Diagnosis not present

## 2015-12-12 DIAGNOSIS — N182 Chronic kidney disease, stage 2 (mild): Secondary | ICD-10-CM | POA: Diagnosis not present

## 2015-12-12 DIAGNOSIS — M4800 Spinal stenosis, site unspecified: Secondary | ICD-10-CM | POA: Diagnosis not present

## 2015-12-12 DIAGNOSIS — I10 Essential (primary) hypertension: Secondary | ICD-10-CM | POA: Diagnosis not present

## 2015-12-12 DIAGNOSIS — M5489 Other dorsalgia: Secondary | ICD-10-CM | POA: Diagnosis not present

## 2015-12-20 DIAGNOSIS — H6121 Impacted cerumen, right ear: Secondary | ICD-10-CM | POA: Diagnosis not present

## 2015-12-28 ENCOUNTER — Encounter: Payer: Self-pay | Admitting: Orthopaedic Surgery

## 2015-12-28 ENCOUNTER — Ambulatory Visit (INDEPENDENT_AMBULATORY_CARE_PROVIDER_SITE_OTHER): Payer: Medicare Other | Admitting: Orthopaedic Surgery

## 2015-12-28 VITALS — BP 144/79 | HR 80 | Ht <= 58 in | Wt 125.0 lb

## 2015-12-28 DIAGNOSIS — I10 Essential (primary) hypertension: Secondary | ICD-10-CM

## 2015-12-28 DIAGNOSIS — M25561 Pain in right knee: Secondary | ICD-10-CM | POA: Diagnosis not present

## 2015-12-28 DIAGNOSIS — G8929 Other chronic pain: Secondary | ICD-10-CM | POA: Diagnosis not present

## 2015-12-28 DIAGNOSIS — M25562 Pain in left knee: Secondary | ICD-10-CM | POA: Diagnosis not present

## 2015-12-28 NOTE — Progress Notes (Signed)
CC: Both of my knees are hurting. I would like an injection in both knees.  The patient has had chronic pain and tenderness of both knees for some time.  Injections help.  There is no locking or giving way of the knee.  There is no new trauma. There is no redness or signs of infections.  The knees have a mild effusion and some crepitus.  There is no redness or signs of recent trauma.  Right knee ROM is 0-100 and left knee ROM is 0-105.  Impression:  Chronic pain of the both knees  Return:  1 month  PROCEDURE NOTE:  The patient requests injections of both knees, verbal consent was obtained.  The left and right knee were individually prepped appropriately after time out was performed.   Sterile technique was observed and injection of 1 cc of Depo-Medrol 40 mg with several cc's of plain xylocaine. Anesthesia was provided by ethyl chloride and a 20-gauge needle was used to inject each knee area. The injections were tolerated well.  A band aid dressing was applied.  The patient was advised to apply ice later today and tomorrow to the injection sight as needed.  Electronically Signed Darreld McleanWayne Eh Sesay, MD 10/25/20179:26 AM

## 2016-01-25 ENCOUNTER — Ambulatory Visit (INDEPENDENT_AMBULATORY_CARE_PROVIDER_SITE_OTHER): Payer: Medicare Other | Admitting: Orthopaedic Surgery

## 2016-01-25 DIAGNOSIS — M25561 Pain in right knee: Secondary | ICD-10-CM

## 2016-01-25 DIAGNOSIS — M25562 Pain in left knee: Secondary | ICD-10-CM

## 2016-01-25 DIAGNOSIS — I10 Essential (primary) hypertension: Secondary | ICD-10-CM

## 2016-01-25 DIAGNOSIS — G8929 Other chronic pain: Secondary | ICD-10-CM

## 2016-01-25 NOTE — Progress Notes (Signed)
CC: Both of my knees are hurting. I would like an injection in both knees.  The patient has had chronic pain and tenderness of both knees for some time.  Injections help.  There is no locking or giving way of the knee.  There is no new trauma. There is no redness or signs of infections.  The knees have a mild effusion and some crepitus.  There is no redness or signs of recent trauma.  Right knee ROM is 0-100 and left knee ROM is 0-95.  Impression:  Chronic pain of the both knees  Return:  1 month  PROCEDURE NOTE:  The patient requests injections of both knees, verbal consent was obtained.  The left and right knee were individually prepped appropriately after time out was performed.   Sterile technique was observed and injection of 1 cc of Depo-Medrol 40 mg with several cc's of plain xylocaine. Anesthesia was provided by ethyl chloride and a 20-gauge needle was used to inject each knee area. The injections were tolerated well.  A band aid dressing was applied.  The patient was advised to apply ice later today and tomorrow to the injection sight as needed.   Electronically Signed Darreld McleanWayne Darik Massing, MD 11/22/20179:36 AM

## 2016-02-02 ENCOUNTER — Ambulatory Visit (INDEPENDENT_AMBULATORY_CARE_PROVIDER_SITE_OTHER): Payer: Medicare Other | Admitting: Otolaryngology

## 2016-02-02 DIAGNOSIS — H6123 Impacted cerumen, bilateral: Secondary | ICD-10-CM

## 2016-02-06 DIAGNOSIS — H1133 Conjunctival hemorrhage, bilateral: Secondary | ICD-10-CM | POA: Diagnosis not present

## 2016-02-22 ENCOUNTER — Ambulatory Visit (INDEPENDENT_AMBULATORY_CARE_PROVIDER_SITE_OTHER): Payer: Medicare Other | Admitting: Orthopaedic Surgery

## 2016-02-22 DIAGNOSIS — I10 Essential (primary) hypertension: Secondary | ICD-10-CM

## 2016-02-22 DIAGNOSIS — M25562 Pain in left knee: Secondary | ICD-10-CM | POA: Diagnosis not present

## 2016-02-22 DIAGNOSIS — M25561 Pain in right knee: Secondary | ICD-10-CM

## 2016-02-22 DIAGNOSIS — G8929 Other chronic pain: Secondary | ICD-10-CM | POA: Diagnosis not present

## 2016-02-22 NOTE — Progress Notes (Signed)
CC: Both of my knees are hurting. I would like an injection in both knees.  The patient has had chronic pain and tenderness of both knees for some time.  Injections help.  There is no locking or giving way of the knee.  There is no new trauma. There is no redness or signs of infections.  The knees have a mild effusion and some crepitus.  There is no redness or signs of recent trauma.  Right knee ROM is 0-100 and left knee ROM is 0-95.  Impression:  Chronic pain of the both knees  Return:  1 month  PROCEDURE NOTE:  The patient requests injections of both knees, verbal consent was obtained.  The left and right knee were individually prepped appropriately after time out was performed.   Sterile technique was observed and injection of 1 cc of Depo-Medrol 40 mg with several cc's of plain xylocaine. Anesthesia was provided by ethyl chloride and a 20-gauge needle was used to inject each knee area. The injections were tolerated well.  A band aid dressing was applied.  The patient was advised to apply ice later today and tomorrow to the injection sight as needed.   Electronically Signed Darreld McleanWayne Stiven Kaspar, MD 12/20/20179:32 AM

## 2016-03-13 DIAGNOSIS — M1610 Unilateral primary osteoarthritis, unspecified hip: Secondary | ICD-10-CM | POA: Diagnosis not present

## 2016-03-13 DIAGNOSIS — N182 Chronic kidney disease, stage 2 (mild): Secondary | ICD-10-CM | POA: Diagnosis not present

## 2016-03-13 DIAGNOSIS — M159 Polyosteoarthritis, unspecified: Secondary | ICD-10-CM | POA: Diagnosis not present

## 2016-03-13 DIAGNOSIS — Z0000 Encounter for general adult medical examination without abnormal findings: Secondary | ICD-10-CM | POA: Diagnosis not present

## 2016-03-13 DIAGNOSIS — I10 Essential (primary) hypertension: Secondary | ICD-10-CM | POA: Diagnosis not present

## 2016-03-13 DIAGNOSIS — R600 Localized edema: Secondary | ICD-10-CM | POA: Diagnosis not present

## 2016-03-13 DIAGNOSIS — M4800 Spinal stenosis, site unspecified: Secondary | ICD-10-CM | POA: Diagnosis not present

## 2016-03-15 DIAGNOSIS — H524 Presbyopia: Secondary | ICD-10-CM | POA: Diagnosis not present

## 2016-03-21 ENCOUNTER — Ambulatory Visit: Payer: Medicare Other | Admitting: Orthopaedic Surgery

## 2016-03-27 ENCOUNTER — Ambulatory Visit (INDEPENDENT_AMBULATORY_CARE_PROVIDER_SITE_OTHER): Payer: Medicare Other | Admitting: Orthopaedic Surgery

## 2016-03-27 DIAGNOSIS — M25561 Pain in right knee: Secondary | ICD-10-CM | POA: Diagnosis not present

## 2016-03-27 DIAGNOSIS — M25562 Pain in left knee: Secondary | ICD-10-CM

## 2016-03-27 DIAGNOSIS — G8929 Other chronic pain: Secondary | ICD-10-CM | POA: Diagnosis not present

## 2016-03-27 NOTE — Progress Notes (Signed)
CC: Both of my knees are hurting. I would like an injection in both knees.  The patient has had chronic pain and tenderness of both knees for some time.  Injections help.  There is no locking or giving way of the knee.  There is no new trauma. There is no redness or signs of infections.  The knees have a mild effusion and some crepitus.  There is no redness or signs of recent trauma.  Right knee ROM is 0-95 and left knee ROM is 0-100.  Impression:  Chronic pain of the both knees  Return:  1 month  PROCEDURE NOTE:  The patient requests injections of both knees, verbal consent was obtained.  The left and right knee were individually prepped appropriately after time out was performed.   Sterile technique was observed and injection of 1 cc of Depo-Medrol 40 mg with several cc's of plain xylocaine. Anesthesia was provided by ethyl chloride and a 20-gauge needle was used to inject each knee area. The injections were tolerated well.  A band aid dressing was applied.  The patient was advised to apply ice later today and tomorrow to the injection sight as needed.  Electronically Signed Darreld McleanWayne Dasani Crear, MD 1/23/20189:56 AM

## 2016-04-26 ENCOUNTER — Ambulatory Visit (INDEPENDENT_AMBULATORY_CARE_PROVIDER_SITE_OTHER): Payer: Medicare Other | Admitting: Orthopaedic Surgery

## 2016-04-26 DIAGNOSIS — M25561 Pain in right knee: Secondary | ICD-10-CM | POA: Diagnosis not present

## 2016-04-26 DIAGNOSIS — G8929 Other chronic pain: Secondary | ICD-10-CM | POA: Diagnosis not present

## 2016-04-26 DIAGNOSIS — M25562 Pain in left knee: Secondary | ICD-10-CM

## 2016-04-26 DIAGNOSIS — I10 Essential (primary) hypertension: Secondary | ICD-10-CM

## 2016-04-26 NOTE — Progress Notes (Signed)
CC: Both of my knees are hurting. I would like an injection in both knees.  The patient has had chronic pain and tenderness of both knees for some time.  Injections help.  There is no locking or giving way of the knee.  There is no new trauma. There is no redness or signs of infections.  The knees have a mild effusion and some crepitus.  There is no redness or signs of recent trauma.  Right knee ROM is 0-100 and left knee ROM is 0-95.  Impression:  Chronic pain of the both knees  Return:  1 month  PROCEDURE NOTE:  The patient requests injections of both knees, verbal consent was obtained.  The left and right knee were individually prepped appropriately after time out was performed.   Sterile technique was observed and injection of 1 cc of Depo-Medrol 40 mg with several cc's of plain xylocaine. Anesthesia was provided by ethyl chloride and a 20-gauge needle was used to inject each knee area. The injections were tolerated well.  A band aid dressing was applied.  The patient was advised to apply ice later today and tomorrow to the injection sight as needed.   Electronically Signed Darreld McleanWayne Ellison Leisure, MD 2/22/20188:36 AM

## 2016-05-24 ENCOUNTER — Ambulatory Visit (INDEPENDENT_AMBULATORY_CARE_PROVIDER_SITE_OTHER): Payer: Medicare Other | Admitting: Orthopaedic Surgery

## 2016-05-24 DIAGNOSIS — M25561 Pain in right knee: Secondary | ICD-10-CM | POA: Diagnosis not present

## 2016-05-24 DIAGNOSIS — G8929 Other chronic pain: Secondary | ICD-10-CM | POA: Diagnosis not present

## 2016-05-24 DIAGNOSIS — I10 Essential (primary) hypertension: Secondary | ICD-10-CM

## 2016-05-24 DIAGNOSIS — M25562 Pain in left knee: Secondary | ICD-10-CM

## 2016-05-24 NOTE — Progress Notes (Signed)
CC: Both of my knees are hurting. I would like an injection in both knees.  The patient has had chronic pain and tenderness of both knees for some time.  Injections help.  There is no locking or giving way of the knee.  There is no new trauma. There is no redness or signs of infections.  The knees have a mild effusion and some crepitus.  There is no redness or signs of recent trauma.  Right knee ROM is 0-100 and left knee ROM is 0-95.  Impression:  Chronic pain of the both knees  Return:  1 month  PROCEDURE NOTE:  The patient requests injections of both knees, verbal consent was obtained.  The left and right knee were individually prepped appropriately after time out was performed.   Sterile technique was observed and injection of 1 cc of Depo-Medrol 40 mg with several cc's of plain xylocaine. Anesthesia was provided by ethyl chloride and a 20-gauge needle was used to inject each knee area. The injections were tolerated well.  A band aid dressing was applied.  The patient was advised to apply ice later today and tomorrow to the injection sight as needed.   Electronically Signed Darreld McleanWayne Neale Marzette, MD 3/22/20188:41 AM

## 2016-06-11 DIAGNOSIS — M4800 Spinal stenosis, site unspecified: Secondary | ICD-10-CM | POA: Diagnosis not present

## 2016-06-11 DIAGNOSIS — Z0000 Encounter for general adult medical examination without abnormal findings: Secondary | ICD-10-CM | POA: Diagnosis not present

## 2016-06-11 DIAGNOSIS — I10 Essential (primary) hypertension: Secondary | ICD-10-CM | POA: Diagnosis not present

## 2016-06-11 DIAGNOSIS — R600 Localized edema: Secondary | ICD-10-CM | POA: Diagnosis not present

## 2016-06-11 DIAGNOSIS — M40209 Unspecified kyphosis, site unspecified: Secondary | ICD-10-CM | POA: Diagnosis not present

## 2016-06-21 ENCOUNTER — Encounter: Payer: Self-pay | Admitting: Orthopaedic Surgery

## 2016-06-21 ENCOUNTER — Ambulatory Visit (INDEPENDENT_AMBULATORY_CARE_PROVIDER_SITE_OTHER): Payer: Medicare Other | Admitting: Orthopaedic Surgery

## 2016-06-21 DIAGNOSIS — M25561 Pain in right knee: Secondary | ICD-10-CM

## 2016-06-21 DIAGNOSIS — M25562 Pain in left knee: Secondary | ICD-10-CM | POA: Diagnosis not present

## 2016-06-21 DIAGNOSIS — G8929 Other chronic pain: Secondary | ICD-10-CM

## 2016-06-21 NOTE — Progress Notes (Signed)
CC: Both of my knees are hurting. I would like an injection in both knees.  The patient has had chronic pain and tenderness of both knees for some time.  Injections help.  There is no locking or giving way of the knee.  There is no new trauma. There is no redness or signs of infections.  The knees have a mild effusion and some crepitus.  There is no redness or signs of recent trauma.  Right knee ROM is 0-95 and left knee ROM is 0-100.  Impression:  Chronic pain of the both knees  Return:  1 month  PROCEDURE NOTE:  The patient requests injections of both knees, verbal consent was obtained.  The left and right knee were individually prepped appropriately after time out was performed.   Sterile technique was observed and injection of 1 cc of Depo-Medrol 40 mg with several cc's of plain xylocaine. Anesthesia was provided by ethyl chloride and a 20-gauge needle was used to inject each knee area. The injections were tolerated well.  A band aid dressing was applied.  The patient was advised to apply ice later today and tomorrow to the injection sight as needed.  Electronically Signed Darreld Mclean, MD 4/19/20188:55 AM

## 2016-07-19 ENCOUNTER — Encounter: Payer: Self-pay | Admitting: Orthopaedic Surgery

## 2016-07-19 ENCOUNTER — Ambulatory Visit (INDEPENDENT_AMBULATORY_CARE_PROVIDER_SITE_OTHER): Payer: Medicare Other | Admitting: Orthopaedic Surgery

## 2016-07-19 DIAGNOSIS — M25562 Pain in left knee: Secondary | ICD-10-CM

## 2016-07-19 DIAGNOSIS — M25561 Pain in right knee: Secondary | ICD-10-CM | POA: Diagnosis not present

## 2016-07-19 DIAGNOSIS — G8929 Other chronic pain: Secondary | ICD-10-CM

## 2016-07-19 DIAGNOSIS — I10 Essential (primary) hypertension: Secondary | ICD-10-CM

## 2016-07-19 NOTE — Progress Notes (Signed)
CC: Both of my knees are hurting. I would like an injection in both knees.  The patient has had chronic pain and tenderness of both knees for some time.  Injections help.  There is no locking or giving way of the knee.  There is no new trauma. There is no redness or signs of infections.  The knees have a mild effusion and some crepitus.  There is no redness or signs of recent trauma.  Right knee ROM is 0-95 and left knee ROM is 0-100.  Impression:  Chronic pain of the both knees  Return:  1 month  PROCEDURE NOTE:  The patient requests injections of both knees, verbal consent was obtained.  The left and right knee were individually prepped appropriately after time out was performed.   Sterile technique was observed and injection of 1 cc of Depo-Medrol 40 mg with several cc's of plain xylocaine. Anesthesia was provided by ethyl chloride and a 20-gauge needle was used to inject each knee area. The injections were tolerated well.  A band aid dressing was applied.  The patient was advised to apply ice later today and tomorrow to the injection sight as needed.  Electronically Signed Darreld McleanWayne Matthan Sledge, MD 5/17/20188:53 AM

## 2016-08-02 ENCOUNTER — Ambulatory Visit (INDEPENDENT_AMBULATORY_CARE_PROVIDER_SITE_OTHER): Payer: Medicare Other | Admitting: Otolaryngology

## 2016-08-02 DIAGNOSIS — H6123 Impacted cerumen, bilateral: Secondary | ICD-10-CM | POA: Diagnosis not present

## 2016-08-16 ENCOUNTER — Encounter: Payer: Self-pay | Admitting: Orthopaedic Surgery

## 2016-08-16 ENCOUNTER — Ambulatory Visit (INDEPENDENT_AMBULATORY_CARE_PROVIDER_SITE_OTHER): Payer: Medicare Other | Admitting: Orthopaedic Surgery

## 2016-08-16 VITALS — BP 132/66 | HR 78 | Temp 97.9°F | Ht <= 58 in | Wt 126.0 lb

## 2016-08-16 DIAGNOSIS — G8929 Other chronic pain: Secondary | ICD-10-CM

## 2016-08-16 DIAGNOSIS — M25561 Pain in right knee: Secondary | ICD-10-CM | POA: Diagnosis not present

## 2016-08-16 DIAGNOSIS — M25562 Pain in left knee: Secondary | ICD-10-CM | POA: Diagnosis not present

## 2016-08-16 NOTE — Progress Notes (Signed)
CC: Both of my knees are hurting. I would like an injection in both knees.  The patient has had chronic pain and tenderness of both knees for some time.  Injections help.  There is no locking or giving way of the knee.  There is no new trauma. There is no redness or signs of infections.  The knees have a mild effusion and some crepitus.  There is no redness or signs of recent trauma.  Right knee ROM is 0-100 and left knee ROM is 0-90.  Impression:  Chronic pain of the both knees  Return:  1 month  PROCEDURE NOTE:  The patient requests injections of both knees, verbal consent was obtained.  The left and right knee were individually prepped appropriately after time out was performed.   Sterile technique was observed and injection of 1 cc of Depo-Medrol 40 mg with several cc's of plain xylocaine. Anesthesia was provided by ethyl chloride and a 20-gauge needle was used to inject each knee area. The injections were tolerated well.  A band aid dressing was applied.  The patient was advised to apply ice later today and tomorrow to the injection sight as needed.   Call if any problem.  Precautions discussed.    Electronically Signed Darreld McleanWayne Emilio Baylock, MD 6/14/20189:13 AM

## 2016-09-12 ENCOUNTER — Emergency Department (HOSPITAL_COMMUNITY): Payer: Medicare Other

## 2016-09-12 ENCOUNTER — Encounter (HOSPITAL_COMMUNITY): Payer: Self-pay | Admitting: *Deleted

## 2016-09-12 ENCOUNTER — Emergency Department (HOSPITAL_COMMUNITY)
Admission: EM | Admit: 2016-09-12 | Discharge: 2016-09-12 | Disposition: A | Discharge status: Home / Self Care | Payer: Medicare Other | Attending: Emergency Medicine | Admitting: Emergency Medicine

## 2016-09-12 DIAGNOSIS — R110 Nausea: Secondary | ICD-10-CM | POA: Diagnosis not present

## 2016-09-12 DIAGNOSIS — Z79899 Other long term (current) drug therapy: Secondary | ICD-10-CM | POA: Insufficient documentation

## 2016-09-12 DIAGNOSIS — Z87891 Personal history of nicotine dependence: Secondary | ICD-10-CM | POA: Diagnosis not present

## 2016-09-12 DIAGNOSIS — R109 Unspecified abdominal pain: Secondary | ICD-10-CM | POA: Diagnosis not present

## 2016-09-12 DIAGNOSIS — M545 Low back pain: Secondary | ICD-10-CM | POA: Insufficient documentation

## 2016-09-12 DIAGNOSIS — R509 Fever, unspecified: Secondary | ICD-10-CM | POA: Diagnosis not present

## 2016-09-12 DIAGNOSIS — R6883 Chills (without fever): Secondary | ICD-10-CM | POA: Diagnosis not present

## 2016-09-12 DIAGNOSIS — I10 Essential (primary) hypertension: Secondary | ICD-10-CM | POA: Insufficient documentation

## 2016-09-12 DIAGNOSIS — R6889 Other general symptoms and signs: Secondary | ICD-10-CM

## 2016-09-12 DIAGNOSIS — Z96632 Presence of left artificial wrist joint: Secondary | ICD-10-CM | POA: Diagnosis not present

## 2016-09-12 LAB — URINALYSIS, ROUTINE W REFLEX MICROSCOPIC
BILIRUBIN URINE: NEGATIVE
Glucose, UA: NEGATIVE mg/dL
Hgb urine dipstick: NEGATIVE
KETONES UR: 5 mg/dL — AB
Leukocytes, UA: NEGATIVE
NITRITE: NEGATIVE
Protein, ur: NEGATIVE mg/dL
SPECIFIC GRAVITY, URINE: 1.013 (ref 1.005–1.030)
pH: 7 (ref 5.0–8.0)

## 2016-09-12 LAB — COMPREHENSIVE METABOLIC PANEL
ALBUMIN: 3.4 g/dL — AB (ref 3.5–5.0)
ALK PHOS: 85 U/L (ref 38–126)
ALT: 11 U/L — AB (ref 14–54)
AST: 26 U/L (ref 15–41)
Anion gap: 7 (ref 5–15)
BUN: 14 mg/dL (ref 6–20)
CHLORIDE: 107 mmol/L (ref 101–111)
CO2: 27 mmol/L (ref 22–32)
CREATININE: 1.01 mg/dL — AB (ref 0.44–1.00)
Calcium: 9.3 mg/dL (ref 8.9–10.3)
GFR calc non Af Amer: 46 mL/min — ABNORMAL LOW (ref >60)
GFR, EST AFRICAN AMERICAN: 54 mL/min — AB (ref >60)
Glucose, Bld: 93 mg/dL (ref 65–99)
Potassium: 4.3 mmol/L (ref 3.5–5.1)
SODIUM: 141 mmol/L (ref 135–145)
Total Bilirubin: 0.9 mg/dL (ref 0.3–1.2)
Total Protein: 6.7 g/dL (ref 6.5–8.1)

## 2016-09-12 LAB — CBC WITH DIFFERENTIAL/PLATELET
BASOS ABS: 0 10*3/uL (ref 0.0–0.1)
BASOS PCT: 0 %
EOS ABS: 0.3 10*3/uL (ref 0.0–0.7)
Eosinophils Relative: 2 %
HCT: 39.7 % (ref 36.0–46.0)
HEMOGLOBIN: 13.6 g/dL (ref 12.0–15.0)
Lymphocytes Relative: 25 %
Lymphs Abs: 3.1 10*3/uL (ref 0.7–4.0)
MCH: 28.5 pg (ref 26.0–34.0)
MCHC: 34.3 g/dL (ref 30.0–36.0)
MCV: 83.2 fL (ref 78.0–100.0)
Monocytes Absolute: 1.1 10*3/uL — ABNORMAL HIGH (ref 0.1–1.0)
Monocytes Relative: 9 %
NEUTROS PCT: 64 %
Neutro Abs: 8.2 10*3/uL — ABNORMAL HIGH (ref 1.7–7.7)
PLATELETS: 228 10*3/uL (ref 150–400)
RBC: 4.77 MIL/uL (ref 3.87–5.11)
RDW: 15.4 % (ref 11.5–15.5)
WBC: 12.8 10*3/uL — AB (ref 4.0–10.5)

## 2016-09-12 MED ORDER — ACETAMINOPHEN 325 MG PO TABS
650.0000 mg | ORAL_TABLET | Freq: Once | ORAL | Status: AC
  Administered 2016-09-12: 650 mg via ORAL
  Filled 2016-09-12: qty 2

## 2016-09-12 MED ORDER — IOPAMIDOL (ISOVUE-300) INJECTION 61%
100.0000 mL | Freq: Once | INTRAVENOUS | Status: AC | PRN
  Administered 2016-09-12: 100 mL via INTRAVENOUS

## 2016-09-12 MED ORDER — SODIUM CHLORIDE 0.9 % IV BOLUS (SEPSIS)
500.0000 mL | Freq: Once | INTRAVENOUS | Status: AC
  Administered 2016-09-12: 500 mL via INTRAVENOUS

## 2016-09-12 MED ORDER — TRAMADOL HCL 50 MG PO TABS: 50.0000 mg | ORAL_TABLET | Freq: Four times a day (QID) | ORAL | 0 refills | Status: DC | PRN

## 2016-09-12 MED ORDER — LEVOFLOXACIN 250 MG PO TABS: 500.0000 mg | ORAL_TABLET | Freq: Every day | ORAL | 0 refills | Status: DC

## 2016-09-12 MED ORDER — ONDANSETRON 4 MG PO TBDP
4.0000 mg | ORAL_TABLET | Freq: Once | ORAL | Status: AC
  Administered 2016-09-12: 4 mg via ORAL
  Filled 2016-09-12: qty 1

## 2016-09-12 MED ORDER — LEVOFLOXACIN 250 MG PO TABS: 250.0000 mg | ORAL_TABLET | Freq: Every day | ORAL | 0 refills | Status: DC

## 2016-09-12 MED ORDER — MORPHINE SULFATE (PF) 2 MG/ML IV SOLN
2.0000 mg | Freq: Once | INTRAVENOUS | Status: AC
  Administered 2016-09-12: 2 mg via INTRAVENOUS
  Filled 2016-09-12: qty 1

## 2016-09-12 NOTE — ED Provider Notes (Signed)
[facility] Provider Note   CSN: [id number] Arrival date & time: 09/12/16  0608     History   Chief Complaint Chief Complaint  Patient presents with  . Chills  . Nausea    HPI Lori Bishop is a 83 y.o. female.  HPI  This is a 83 year old female with a history of arthritis with chronic knee and back pain, hypertension who presents with chills, back pain, and nausea. Patient reports that she went to bed in her usual manner last night. She woke up at 1 AM with chills. No documented fevers. She states that she added a blanket bed. His morning she states that "everybody my body hurts." Specifically she reports midline lower back pain. She's not taken anything for the pain. Denies weakness, numbness, tingling of the lower extremities, difficulty with her bowel or bladder. She denies chest pain shortness of breath, abdominal pain. She does endorse some nausea. No recent illnesses, history of cancer, immunosuppression.  Past Medical History:  Diagnosis Date  . Bilateral chronic knee pain   . Carpal tunnel syndrome, bilateral   . Choledocholithiasis 3 2014 and 5 2014, Dec 2014   S/P ERCP and sphincterotomy x3  . Chronic back pain   . Gout   . Hypertension   . Leg cramps   . Osteoarthritis     Patient Active Problem List   Diagnosis Date Noted  . CAP (community acquired pneumonia) 06/13/2015  . Left knee pain 04/26/2015  . Right knee pain 04/26/2015  . Right upper quadrant pain   . Calculus of bile duct with acute cholangitis with obstruction   . Choledocholithiasis with cholecystitis   . Severe carpal tunnel syndrome 02/02/2014  . Hand weakness 02/02/2014  . Unspecified hereditary and idiopathic peripheral neuropathy 10/30/2013  . Choledocholithiasis 04/17/2013  . Abdominal pain 02/14/2013  . Acute pancreatitis 02/14/2013  . Elevated LFTs 02/14/2013  . Hepatic steatosis 02/14/2013  . Hyperbilirubinemia 02/14/2013  . Leukocytosis 02/14/2013  . Osteoarthritis  02/14/2013  . RUQ pain 07/07/2012  . VIRAL URI 02/17/2008  . PAIN IN JOINT OTHER SPECIFIED SITES 06/09/2007  . PLEURISY 09/03/2006  . LIVER FUNCTION TESTS, ABNORMAL 09/03/2006  . HYPOKALEMIA 06/14/2006  . CONSTIPATION 05/16/2006  . NUMBNESS, HAND 05/16/2006  . Hypoxemia 04/17/2006  . HYPERLIPIDEMIA 01/04/2006  . GOUT 01/04/2006  . CARPAL TUNNEL SYNDROME 01/04/2006  . CATARACT NOS 01/04/2006  . Essential hypertension 01/04/2006  . BRONCHITIS NOS 01/04/2006  . ARTHRITIS 01/04/2006  . SPINAL STENOSIS 01/04/2006  . OSTEOPOROSIS 01/04/2006    Past Surgical History:  Procedure Laterality Date  . APPENDECTOMY    . BALLOON DILATION  05/23/2012   Procedure: AMPULLARY BALLOON DILATION;  Surgeon: Corbin Ade, MD;  Location: [facility];  Service: Endoscopy;;  . BALLOON DILATION N/A 07/24/2012   Procedure: BALLOON DILATION; Balloon basket dredge and balloon dialate sphincterotomy;  Surgeon: Corbin Ade, MD;  Location: [facility];  Service: Endoscopy;  Laterality: N/A;  . BALLOON DILATION N/A 02/17/2013   Procedure: BALLOON DILATION;  Surgeon: Corbin Ade, MD;  Location: [facility];  Service: Endoscopy;  Laterality: N/A;  . BILIARY STENT PLACEMENT  05/23/2012   Procedure: BILIARY STENT PLACEMENT;  Surgeon: Corbin Ade, MD;  Location: [facility];  Service: Endoscopy;;  . BREAST BIOPSY Left   . CARPAL TUNNEL RELEASE Right   . CATARACT EXTRACTION Bilateral   . CHOLECYSTECTOMY    . CHOLEDOCHOENTEROSTOMY N/A 11/09/2014   Procedure: CHOLEDOCHODUODENOSTOMY;  Surgeon: Franky Macho, MD;  Location: [facility];  Service: General;  Laterality: N/A;  . ERCP  05/23/2012   RMR: juxta-ampullary duodenal diverticulum, markedly dilated biliary tree with choledocholithiasis, s/p sphincterotomy with biliary sphincterotomy balloon dilation, balloon and basket stone extraction, stent placement  . ERCP N/A 07/24/2012   ZOX:[id number]:Juxta-ampullary duodenal diverticulum/s/p dilation sphincterotomy site s/p basket stone extraction s/p  cholangioscopy with holmium lithotripsy of common duct stones  . ERCP N/A 02/17/2013   Dr. Jena Gaussourk: markedly dilated biliary tree with choledocholithiasis, s/p sphincterotomy and extraction  . HOLMIUM LASER APPLICATION N/A 07/24/2012   Procedure: HOLMIUM LASER APPLICATION;  Surgeon: Corbin Adeobert M Rourk, MD;  Location: [facility];  Service: Endoscopy;  Laterality: N/A;  . REMOVAL OF STONES  05/23/2012   Procedure: BALLOON AND BASKET REMOVAL OF STONES;  Surgeon: Corbin Adeobert M Rourk, MD;  Location: [facility];  Service: Endoscopy;;  . REMOVAL OF STONES N/A 07/24/2012   Procedure: REMOVAL OF STONES;  Surgeon: Corbin Adeobert M Rourk, MD;  Location: [facility];  Service: Endoscopy;  Laterality: N/A;  . right femur     metal plate  . SPHINCTEROTOMY  05/23/2012   Procedure: SPHINCTEROTOMY;  Surgeon: Corbin Adeobert M Rourk, MD;  Location: [facility];  Service: Endoscopy;;  . Burman FreestoneSPYGLASS CHOLANGIOSCOPY N/A 07/24/2012   Procedure: YNWGNFAOSPYGLASS CHOLANGIOSCOPY;  Surgeon: Corbin Adeobert M Rourk, MD;  Location: [facility];  Service: Endoscopy;  Laterality: N/A;  [name]. SPYGLASS LITHOTRIPSY N/A 07/24/2012   Procedure: ZHYQMVHQSPYGLASS LITHOTRIPSY;  Surgeon: Corbin Adeobert M Rourk, MD;  Location: [facility];  Service: Endoscopy;  Laterality: N/A;  . STONE EXTRACTION WITH BASKET N/A 02/17/2013   Procedure: STONE EXTRACTION WITH BASKET and BALLOON;  Surgeon: Corbin Adeobert M Rourk, MD;  Location: [facility];  Service: Endoscopy;  Laterality: N/A;  . WRIST ARTHROPLASTY Right     OB History    Gravida Para Term Preterm AB Living   1 1 1      0   SAB TAB Ectopic Multiple Live Births                   Home Medications    Prior to Admission medications   Medication Sig Start Date End Date Taking? Authorizing Provider  acetaminophen (TYLENOL) 650 MG CR tablet Take 650 mg by mouth every 8 (eight) hours as needed for pain.   Yes [provider]  allopurinol (ZYLOPRIM) 100 MG tablet Take 100 mg by mouth daily.   Yes [provider]  felodipine (PLENDIL) 10 MG 24 hr tablet Take 10 mg by mouth  daily.   Yes [provider]  furosemide (LASIX) 40 MG tablet Take 40 mg by mouth 2 (two) times daily as needed for fluid or edema.    Yes [provider]  gabapentin (NEURONTIN) 100 MG capsule Take 100 mg by mouth 3 (three) times daily.   Yes [provider]  metolazone (ZAROXOLYN) 2.5 MG tablet Take 2.5 mg by mouth daily as needed (for fluid).  08/10/13  Yes [provider]  pantoprazole (PROTONIX) 40 MG tablet Take 1 tablet (40 mg total) by mouth daily before breakfast. 06/16/15  Yes Fanta, Tesfaye, MD  polyethylene glycol (MIRALAX / GLYCOLAX) packet Take 17 g by mouth daily as needed for moderate constipation.   Yes [provider]  potassium chloride SA (K-DUR,KLOR-CON) 20 MEQ tablet Take 40 mEq by mouth daily.    Yes [provider]  traMADol (ULTRAM) 50 MG tablet Take 1 tablet by mouth 2 (two) times daily as needed for moderate pain.  08/05/15  Yes [provider]    Family  History Family History  Problem Relation Age of Onset  . CAD Mother   . Stroke Father   . Cancer Son        ?metastatic  . Colon cancer Neg Hx     Social History Social History  Substance Use Topics  . Smoking status: Former Smoker    Packs/day: 0.25    Years: 15.00    Types: Cigarettes    Quit date: 07/17/1988  . Smokeless tobacco: Never Used  . Alcohol use No     Allergies   Patient has no known allergies.   Review of Systems Review of Systems  Constitutional: Positive for chills. Negative for diaphoresis and fever.  Respiratory: Negative for shortness of breath.   Cardiovascular: Negative for chest pain.  Gastrointestinal: Positive for nausea. Negative for abdominal pain, diarrhea and vomiting.  Genitourinary: Negative for difficulty urinating, dysuria and flank pain.  Musculoskeletal: Positive for arthralgias, back pain and myalgias.  Skin: Negative for rash.  Neurological: Negative for weakness and numbness.  All other systems  reviewed and are negative.    Physical Exam Updated Vital Signs BP (!) 141/75 (BP Location: Left Arm)   Pulse 84   Temp 98.4 F (36.9 C) (Oral)   Resp 17   Ht 5' (1.524 m)   Wt 57.2 kg (126 lb)   SpO2 98%   BMI 24.61 kg/m   Physical Exam  Constitutional: She is oriented to person, place, and time. No distress.  Elderly, no acute distress  HENT:  Head: Normocephalic and atraumatic.  Eyes: Pupils are equal, round, and reactive to light.  Cardiovascular: Normal rate, regular rhythm and normal heart sounds.   No murmur heard. Pulmonary/Chest: Effort normal and breath sounds normal. No respiratory distress. She has no wheezes.  Abdominal: Soft. Bowel sounds are normal. There is no tenderness. There is no guarding.  Musculoskeletal:  Tenderness palpation lower lumbar spine, no step-off or deformities noted, no lateralizing tenderness to palpation  Neurological: She is alert and oriented to person, place, and time.  5 out of 5 strength bilateral lower extremities, normal reflexes bilaterally  Skin: Skin is warm and dry.  Psychiatric: She has a normal mood and affect.  Nursing note and vitals reviewed.    ED Treatments / Results  Labs (all labs ordered are listed, but only abnormal results are displayed) Labs Reviewed  CBC WITH DIFFERENTIAL/PLATELET  COMPREHENSIVE METABOLIC PANEL  URINALYSIS, ROUTINE W REFLEX MICROSCOPIC    EKG  EKG Interpretation  Date/Time:  Wednesday September 12 2016 06:52:41 EDT Ventricular Rate:  85 PR Interval:    QRS Duration: 88 QT Interval:  364 QTC Calculation: 433 R Axis:   -71 Text Interpretation:  Sinus rhythm Left anterior fascicular block Borderline low voltage, extremity leads Abnormal R-wave progression, late transition Q waves inferiorly, similar to prior Confirmed by Ross Marcus ([id number]) on 09/12/2016 6:57:41 AM       Radiology No results found.  Procedures Procedures (including critical care time)  Medications Ordered in  ED Medications  acetaminophen (TYLENOL) tablet 650 mg (650 mg Oral Given 09/12/16 0646)     Initial Impression / Assessment and Plan / ED Course  I have reviewed the triage vital signs and the nursing notes.  Pertinent labs & imaging results that were available during my care of the patient were reviewed by me and considered in my medical decision making (see chart for details).    Patient presents with chills, arthralgias, back pain. She is nontoxic on exam. Vital signs  are reassuring. She reports a vague general symptoms. No chest pain, shortness breath, abdominal pain. Her exam is fairly benign with the exception of tenderness palpation over the lower lumbar spine. Screening lab work sent. Given nausea, EKG obtained. No signs of ischemia.  Labs are pending. Signed out to Dr. Estell Harpin.  Final Clinical Impressions(s) / ED Diagnoses   Final diagnoses:  None    New Prescriptions New Prescriptions   No medications on file     Shon Baton, MD 09/12/16 2308

## 2016-09-12 NOTE — ED Notes (Signed)
Pt taken to CT.

## 2016-09-12 NOTE — Discharge Instructions (Signed)
Follow-up with her family doctor next week as planned. Return sooner if problems

## 2016-09-12 NOTE — ED Notes (Signed)
Repositioned pt due to 02 sats 89%.  Pt now 100% on RA.

## 2016-09-12 NOTE — ED Triage Notes (Signed)
Pt states chills that started this morning w/ nausea.

## 2016-09-12 NOTE — ED Notes (Signed)
Pt taken to XR.  

## 2016-09-12 NOTE — ED Provider Notes (Signed)
Patient with flank pain and chills. Labs unremarkable. Patient also had a CT of abdomen that was negative. She will have a urine culture done and will empirically be treated for possible UTI. Patient will follow-up with her PCP next week or sooner if problems   Bethann BerkshireZammit, Yuridia Couts, MD 09/12/16 1101

## 2016-09-13 ENCOUNTER — Ambulatory Visit: Payer: Medicare Other | Admitting: Orthopaedic Surgery

## 2016-09-14 LAB — URINE CULTURE

## 2016-09-15 ENCOUNTER — Telehealth: Payer: Self-pay

## 2016-09-15 NOTE — Telephone Encounter (Signed)
NO further treatment needed for [facility] ED [facility]12/[facility]

## 2016-09-19 ENCOUNTER — Ambulatory Visit (INDEPENDENT_AMBULATORY_CARE_PROVIDER_SITE_OTHER): Payer: Medicare Other | Admitting: Orthopaedic Surgery

## 2016-09-19 ENCOUNTER — Encounter: Payer: Self-pay | Admitting: Orthopaedic Surgery

## 2016-09-19 DIAGNOSIS — M25561 Pain in right knee: Secondary | ICD-10-CM | POA: Diagnosis not present

## 2016-09-19 DIAGNOSIS — G8929 Other chronic pain: Secondary | ICD-10-CM

## 2016-09-19 DIAGNOSIS — M25562 Pain in left knee: Secondary | ICD-10-CM

## 2016-09-19 NOTE — Progress Notes (Signed)
CC: Both of my knees are hurting. I would like an injection in both knees.  The patient has had chronic pain and tenderness of both knees for some time.  Injections help.  There is no locking or giving way of the knee.  There is no new trauma. There is no redness or signs of infections.  The knees have a mild effusion and some crepitus.  There is no redness or signs of recent trauma.  Right knee ROM is 0-95 and left knee ROM is 0-100.  Impression:  Chronic pain of the both knees  Return:  1 month  PROCEDURE NOTE:  The patient requests injections of both knees, verbal consent was obtained.  The left and right knee were individually prepped appropriately after time out was performed.   Sterile technique was observed and injection of 1 cc of Depo-Medrol 40 mg with several cc's of plain xylocaine. Anesthesia was provided by ethyl chloride and a 20-gauge needle was used to inject each knee area. The injections were tolerated well.  A band aid dressing was applied.  The patient was advised to apply ice later today and tomorrow to the injection sight as needed.    Electronically Signed Darreld McleanWayne Cha Gomillion, MD 7/18/20189:26 AM

## 2016-09-20 ENCOUNTER — Ambulatory Visit: Payer: Medicare Other | Admitting: Orthopaedic Surgery

## 2016-09-20 DIAGNOSIS — M1A00X0 Idiopathic chronic gout, unspecified site, without tophus (tophi): Secondary | ICD-10-CM | POA: Diagnosis not present

## 2016-09-20 DIAGNOSIS — I10 Essential (primary) hypertension: Secondary | ICD-10-CM | POA: Diagnosis not present

## 2016-09-20 DIAGNOSIS — M4800 Spinal stenosis, site unspecified: Secondary | ICD-10-CM | POA: Diagnosis not present

## 2016-09-20 DIAGNOSIS — Z1389 Encounter for screening for other disorder: Secondary | ICD-10-CM | POA: Diagnosis not present

## 2016-09-20 DIAGNOSIS — M40209 Unspecified kyphosis, site unspecified: Secondary | ICD-10-CM | POA: Diagnosis not present

## 2016-10-11 ENCOUNTER — Emergency Department (HOSPITAL_COMMUNITY)
Admission: EM | Admit: 2016-10-11 | Discharge: 2016-10-11 | Disposition: A | Discharge status: Home / Self Care | Payer: Medicare Other | Attending: Emergency Medicine | Admitting: Emergency Medicine

## 2016-10-11 ENCOUNTER — Encounter (HOSPITAL_COMMUNITY): Payer: Self-pay

## 2016-10-11 DIAGNOSIS — Z87891 Personal history of nicotine dependence: Secondary | ICD-10-CM | POA: Diagnosis not present

## 2016-10-11 DIAGNOSIS — Z79899 Other long term (current) drug therapy: Secondary | ICD-10-CM | POA: Insufficient documentation

## 2016-10-11 DIAGNOSIS — Z96631 Presence of right artificial wrist joint: Secondary | ICD-10-CM | POA: Insufficient documentation

## 2016-10-11 DIAGNOSIS — M546 Pain in thoracic spine: Secondary | ICD-10-CM | POA: Diagnosis not present

## 2016-10-11 DIAGNOSIS — I10 Essential (primary) hypertension: Secondary | ICD-10-CM | POA: Diagnosis not present

## 2016-10-11 DIAGNOSIS — R109 Unspecified abdominal pain: Secondary | ICD-10-CM

## 2016-10-11 DIAGNOSIS — N390 Urinary tract infection, site not specified: Secondary | ICD-10-CM | POA: Diagnosis not present

## 2016-10-11 DIAGNOSIS — R52 Pain, unspecified: Secondary | ICD-10-CM | POA: Diagnosis not present

## 2016-10-11 LAB — URINALYSIS, ROUTINE W REFLEX MICROSCOPIC
Bilirubin Urine: NEGATIVE
Glucose, UA: NEGATIVE mg/dL
NITRITE: NEGATIVE
PH: 6 (ref 5.0–8.0)
PROTEIN: NEGATIVE mg/dL
Specific Gravity, Urine: 1.01 (ref 1.005–1.030)

## 2016-10-11 LAB — URINALYSIS, MICROSCOPIC (REFLEX)

## 2016-10-11 MED ORDER — TRAMADOL HCL 50 MG PO TABS
50.0000 mg | ORAL_TABLET | Freq: Once | ORAL | Status: AC
  Administered 2016-10-11: 50 mg via ORAL
  Filled 2016-10-11: qty 1

## 2016-10-11 MED ORDER — OXYCODONE-ACETAMINOPHEN 5-325 MG PO TABS: 1.0000 | ORAL_TABLET | ORAL | 0 refills | Status: DC | PRN

## 2016-10-11 MED ORDER — IBUPROFEN 400 MG PO TABS
400.0000 mg | ORAL_TABLET | Freq: Once | ORAL | Status: AC
  Administered 2016-10-11: 400 mg via ORAL
  Filled 2016-10-11: qty 1

## 2016-10-11 MED ORDER — ONDANSETRON 8 MG PO TBDP
8.0000 mg | ORAL_TABLET | Freq: Once | ORAL | Status: AC
  Administered 2016-10-11: 8 mg via ORAL
  Filled 2016-10-11: qty 1

## 2016-10-11 MED ORDER — OXYCODONE-ACETAMINOPHEN 5-325 MG PO TABS
1.0000 | ORAL_TABLET | Freq: Once | ORAL | Status: AC
  Administered 2016-10-11: 1 via ORAL
  Filled 2016-10-11: qty 1

## 2016-10-11 MED ORDER — CEPHALEXIN 500 MG PO CAPS: 500.0000 mg | ORAL_CAPSULE | Freq: Three times a day (TID) | ORAL | 0 refills | Status: DC

## 2016-10-11 MED ORDER — ONDANSETRON HCL 4 MG PO TABS: 4.0000 mg | ORAL_TABLET | Freq: Four times a day (QID) | ORAL | 0 refills | Status: DC | PRN

## 2016-10-11 MED ORDER — CEPHALEXIN 500 MG PO CAPS
500.0000 mg | ORAL_CAPSULE | Freq: Once | ORAL | Status: AC
  Administered 2016-10-11: 500 mg via ORAL
  Filled 2016-10-11: qty 1

## 2016-10-11 NOTE — ED Provider Notes (Signed)
[facility] Provider Note   CSN: [id number] Arrival date & time: 10/11/16  0357     History   Chief Complaint Chief Complaint  Patient presents with  . Back Pain    HPI Lori Bishop is a 83 y.o. female.  The history is provided by the patient.  Back Pain    She was awakened at about 2:45 AM my severe pain in the right flank. She is unable to put a number on the pain. There is no radiation of the pain. There is associated nausea but no vomiting. She denies fever or chills and she denies urinary urgency, frequency, tenesmus, dysuria. Nothing makes it better, nothing makes it worse. She has not taken anything for pain.  Past Medical History:  Diagnosis Date  . Bilateral chronic knee pain   . Carpal tunnel syndrome, bilateral   . Choledocholithiasis 3 2014 and 5 2014, Dec 2014   S/P ERCP and sphincterotomy x3  . Chronic back pain   . Gout   . Hypertension   . Leg cramps   . Osteoarthritis     Patient Active Problem List   Diagnosis Date Noted  . CAP (community acquired pneumonia) 06/13/2015  . Left knee pain 04/26/2015  . Right knee pain 04/26/2015  . Right upper quadrant pain   . Calculus of bile duct with acute cholangitis with obstruction   . Choledocholithiasis with cholecystitis   . Severe carpal tunnel syndrome 02/02/2014  . Hand weakness 02/02/2014  . Unspecified hereditary and idiopathic peripheral neuropathy 10/30/2013  . Choledocholithiasis 04/17/2013  . Abdominal pain 02/14/2013  . Acute pancreatitis 02/14/2013  . Elevated LFTs 02/14/2013  . Hepatic steatosis 02/14/2013  . Hyperbilirubinemia 02/14/2013  . Leukocytosis 02/14/2013  . Osteoarthritis 02/14/2013  . RUQ pain 07/07/2012  . VIRAL URI 02/17/2008  . PAIN IN JOINT OTHER SPECIFIED SITES 06/09/2007  . PLEURISY 09/03/2006  . LIVER FUNCTION TESTS, ABNORMAL 09/03/2006  . HYPOKALEMIA 06/14/2006  . CONSTIPATION 05/16/2006  . NUMBNESS, HAND 05/16/2006  . Hypoxemia 04/17/2006  .  HYPERLIPIDEMIA 01/04/2006  . GOUT 01/04/2006  . CARPAL TUNNEL SYNDROME 01/04/2006  . CATARACT NOS 01/04/2006  . Essential hypertension 01/04/2006  . BRONCHITIS NOS 01/04/2006  . ARTHRITIS 01/04/2006  . SPINAL STENOSIS 01/04/2006  . OSTEOPOROSIS 01/04/2006    Past Surgical History:  Procedure Laterality Date  . APPENDECTOMY    . BALLOON DILATION  05/23/2012   Procedure: AMPULLARY BALLOON DILATION;  Surgeon: Corbin Ade, MD;  Location: [facility];  Service: Endoscopy;;  . BALLOON DILATION N/A 07/24/2012   Procedure: BALLOON DILATION; Balloon basket dredge and balloon dialate sphincterotomy;  Surgeon: Corbin Ade, MD;  Location: [facility];  Service: Endoscopy;  Laterality: N/A;  . BALLOON DILATION N/A 02/17/2013   Procedure: BALLOON DILATION;  Surgeon: Corbin Ade, MD;  Location: [facility];  Service: Endoscopy;  Laterality: N/A;  . BILIARY STENT PLACEMENT  05/23/2012   Procedure: BILIARY STENT PLACEMENT;  Surgeon: Corbin Ade, MD;  Location: [facility];  Service: Endoscopy;;  . BREAST BIOPSY Left   . CARPAL TUNNEL RELEASE Right   . CATARACT EXTRACTION Bilateral   . CHOLECYSTECTOMY    . CHOLEDOCHOENTEROSTOMY N/A 11/09/2014   Procedure: CHOLEDOCHODUODENOSTOMY;  Surgeon: Franky Macho, MD;  Location: [facility];  Service: General;  Laterality: N/A;  . ERCP  05/23/2012   RMR: juxta-ampullary duodenal diverticulum, markedly dilated biliary tree with choledocholithiasis, s/p sphincterotomy with biliary sphincterotomy balloon dilation, balloon and basket stone extraction, stent placement  . ERCP N/A  07/24/2012   VWU:[id number] duodenal diverticulum/s/p dilation sphincterotomy site s/p basket stone extraction s/p cholangioscopy with holmium lithotripsy of common duct stones  . ERCP N/A 02/17/2013   Dr. Jena Gauss: markedly dilated biliary tree with choledocholithiasis, s/p sphincterotomy and extraction  . HOLMIUM LASER APPLICATION N/A 07/24/2012   Procedure: HOLMIUM LASER APPLICATION;  Surgeon: Corbin Ade, MD;  Location: [facility];  Service: Endoscopy;  Laterality: N/A;  . REMOVAL OF STONES  05/23/2012   Procedure: BALLOON AND BASKET REMOVAL OF STONES;  Surgeon: Corbin Ade, MD;  Location: [facility];  Service: Endoscopy;;  . REMOVAL OF STONES N/A 07/24/2012   Procedure: REMOVAL OF STONES;  Surgeon: Corbin Ade, MD;  Location: [facility];  Service: Endoscopy;  Laterality: N/A;  . right femur     metal plate  . SPHINCTEROTOMY  05/23/2012   Procedure: SPHINCTEROTOMY;  Surgeon: Corbin Ade, MD;  Location: [facility];  Service: Endoscopy;;  . [name] CHOLANGIOSCOPY N/A 07/24/2012   Procedure: ZHYQMVHQ CHOLANGIOSCOPY;  Surgeon: Corbin Ade, MD;  Location: [facility];  Service: Endoscopy;  Laterality: N/A;  [name] LITHOTRIPSY N/A 07/24/2012   Procedure: IONGEXBM LITHOTRIPSY;  Surgeon: Corbin Ade, MD;  Location: [facility];  Service: Endoscopy;  Laterality: N/A;  . STONE EXTRACTION WITH BASKET N/A 02/17/2013   Procedure: STONE EXTRACTION WITH BASKET and BALLOON;  Surgeon: Corbin Ade, MD;  Location: [facility];  Service: Endoscopy;  Laterality: N/A;  . WRIST ARTHROPLASTY Right     OB History    Gravida Para Term Preterm AB Living   1 1 1      0   SAB TAB Ectopic Multiple Live Births                   Home Medications    Prior to Admission medications   Medication Sig Start Date End Date Taking? Authorizing Provider  allopurinol (ZYLOPRIM) 100 MG tablet Take 100 mg by mouth daily.    [provider]  felodipine (PLENDIL) 10 MG 24 hr tablet Take 10 mg by mouth daily.    [provider]  furosemide (LASIX) 40 MG tablet Take 40 mg by mouth 2 (two) times daily as needed for fluid or edema.     [provider]  gabapentin (NEURONTIN) 100 MG capsule Take 100 mg by mouth 3 (three) times daily.    [provider]  ibuprofen (ADVIL,MOTRIN) 200 MG tablet Take 200 mg by mouth every 6 (six) hours as needed.    [provider]  levofloxacin (LEVAQUIN) 250 MG  tablet Take 2 tablets (500 mg total) by mouth daily. 09/12/16   Bethann Berkshire, MD  levofloxacin (LEVAQUIN) 250 MG tablet Take 1 tablet (250 mg total) by mouth daily. 09/12/16   Bethann Berkshire, MD  pantoprazole (PROTONIX) 40 MG tablet Take 1 tablet (40 mg total) by mouth daily before breakfast. 06/16/15   Avon Gully, MD  polyethylene glycol (MIRALAX / GLYCOLAX) packet Take 17 g by mouth daily as needed for moderate constipation.    [provider]  potassium chloride SA (K-DUR,KLOR-CON) 20 MEQ tablet Take 40 mEq by mouth daily.     [provider]  traMADol (ULTRAM) 50 MG tablet Take 1 tablet (50 mg total) by mouth every 6 (six) hours as needed. 09/12/16   Bethann Berkshire, MD    Family History Family History  Problem Relation Age of Onset  . CAD Mother   . Stroke Father   . Cancer Son        ?  metastatic  . Colon cancer Neg Hx     Social History Social History  Substance Use Topics  . Smoking status: Former Smoker    Packs/day: 0.25    Years: 15.00    Types: Cigarettes    Quit date: 07/17/1988  . Smokeless tobacco: Never Used  . Alcohol use No     Allergies   Patient has no known allergies.   Review of Systems Review of Systems  Musculoskeletal: Positive for back pain.  All other systems reviewed and are negative.    Physical Exam Updated Vital Signs BP (!) 148/80 (BP Location: Left Arm)   Pulse 76   Temp 98.1 F (36.7 C) (Oral)   Resp 17   Ht 5' (1.524 m)   Wt 61.7 kg (136 lb)   SpO2 100%   BMI 26.56 kg/m   Physical Exam  Nursing note and vitals reviewed.  82 year old female, resting comfortably and in no acute distress. Vital signs are significant for hypertension. Oxygen saturation is 100%, which is normal. Head is normocephalic and atraumatic. PERRLA, EOMI. Oropharynx is clear. Neck is nontender and supple without adenopathy or JVD. Back is nontender in the midline. There is moderate right CVA tenderness. Lungs are clear without rales,  wheezes, or rhonchi. Chest is nontender. Heart has regular rate and rhythm without murmur. Abdomen is soft, flat, nontender without masses or hepatosplenomegaly and peristalsis is normoactive. Extremities have no cyanosis or edema, full range of motion is present. Skin is warm and dry without rash. Neurologic: Mental status is normal, cranial nerves are intact, there are no motor or sensory deficits.  ED Treatments / Results  Labs (all labs ordered are listed, but only abnormal results are displayed) Labs Reviewed  URINALYSIS, ROUTINE W REFLEX MICROSCOPIC - Abnormal; Notable for the following:       Result Value   Hgb urine dipstick TRACE (*)    Ketones, ur TRACE (*)    Leukocytes, UA TRACE (*)    All other components within normal limits  URINALYSIS, MICROSCOPIC (REFLEX) - Abnormal; Notable for the following:    Bacteria, UA MANY (*)    Squamous Epithelial / LPF TOO NUMEROUS TO COUNT (*)    All other components within normal limits  URINE CULTURE   Procedures Procedures (including critical care time)  Medications Ordered in ED Medications  ondansetron (ZOFRAN-ODT) disintegrating tablet 8 mg (8 mg Oral Given 10/11/16 0420)  ibuprofen (ADVIL,MOTRIN) tablet 400 mg (400 mg Oral Given 10/11/16 0419)  traMADol (ULTRAM) tablet 50 mg (50 mg Oral Given 10/11/16 0508)  cephALEXin (KEFLEX) capsule 500 mg (500 mg Oral Given 10/11/16 0508)  oxyCODONE-acetaminophen (PERCOCET/ROXICET) 5-325 MG per tablet 1 tablet (1 tablet Oral Given 10/11/16 0555)     Initial Impression / Assessment and Plan / ED Course  I have reviewed the triage vital signs and the nursing notes.  Pertinent labs & imaging results that were available during my care of the patient were reviewed by me and considered in my medical decision making (see chart for details).  Right flank pain of uncertain cause. Old records are reviewed, and she was seen one month ago and had a CT of abdomen and pelvis which showed no aneurysm, no renal  calculi. Will check urinalysis to look for evidence of urinary tract infection. She is given ondansetron for nausea and ibuprofen for pain.  Urinalysis is a 30 specimen, but with many bacteria. With significant flank pain, I am not certain that she does not have  a urinary tract infection. Specimen sent for culture, and she is started on cephalexin. She was initially given ibuprofen for pain with little relief. She was given tramadol, which also gave little relief. She was given oxycodone-acetaminophen, which gave good relief. She also was given ondansetron with good relief of nausea. She is discharged with prescriptions for oxycodone have acetaminophen, ondansetron, and cephalexin. Follow-up with PCP. Return precautions discussed.  Final Clinical Impressions(s) / ED Diagnoses   Final diagnoses:  Right flank pain  Urinary tract infection without hematuria, site unspecified    New Prescriptions New Prescriptions   CEPHALEXIN (KEFLEX) 500 MG CAPSULE    Take 1 capsule (500 mg total) by mouth 3 (three) times daily.   ONDANSETRON (ZOFRAN) 4 MG TABLET    Take 1 tablet (4 mg total) by mouth every 6 (six) hours as needed for nausea.   OXYCODONE-ACETAMINOPHEN (PERCOCET) 5-325 MG TABLET    Take 1-2 tablets by mouth every 4 (four) hours as needed for moderate pain.     Dione BoozeGlick, Kaisha Wachob, MD 10/11/16 [phone]

## 2016-10-11 NOTE — Discharge Instructions (Signed)
Your urine may have an infection in it. It has been sent for culture. In the mean time, take the antibiotic as prescribed. Apply ice and/or heat to your back as needed. Return if pain is not being adequately controlled, or if you start running a fever.

## 2016-10-11 NOTE — ED Triage Notes (Signed)
Lower back pain started approx 1 am, states pain is across her kidney area.  Pt denies injury or trauma, and denies urinary symptoms.

## 2016-10-11 NOTE — ED Notes (Signed)
Pt wheeled to waiting room. Pt verbalized understanding of discharge instructions.   

## 2016-10-12 LAB — URINE CULTURE

## 2016-10-17 ENCOUNTER — Ambulatory Visit: Payer: Medicare Other | Admitting: Orthopaedic Surgery

## 2016-11-13 ENCOUNTER — Other Ambulatory Visit (HOSPITAL_COMMUNITY): Payer: Self-pay | Admitting: Internal Medicine

## 2016-11-13 ENCOUNTER — Ambulatory Visit (HOSPITAL_COMMUNITY)
Admission: RE | Admit: 2016-11-13 | Discharge: 2016-11-13 | Disposition: A | Discharge status: Home / Self Care | Payer: Medicare Other | Source: Ambulatory Visit | Attending: Internal Medicine | Admitting: Internal Medicine

## 2016-11-13 DIAGNOSIS — M25511 Pain in right shoulder: Secondary | ICD-10-CM | POA: Insufficient documentation

## 2016-11-13 DIAGNOSIS — M40209 Unspecified kyphosis, site unspecified: Secondary | ICD-10-CM | POA: Diagnosis not present

## 2016-11-13 DIAGNOSIS — I10 Essential (primary) hypertension: Secondary | ICD-10-CM | POA: Diagnosis not present

## 2016-11-13 DIAGNOSIS — W19XXXA Unspecified fall, initial encounter: Secondary | ICD-10-CM | POA: Insufficient documentation

## 2016-11-13 DIAGNOSIS — N182 Chronic kidney disease, stage 2 (mild): Secondary | ICD-10-CM | POA: Diagnosis not present

## 2016-11-13 DIAGNOSIS — M159 Polyosteoarthritis, unspecified: Secondary | ICD-10-CM | POA: Diagnosis not present

## 2016-11-13 DIAGNOSIS — Z23 Encounter for immunization: Secondary | ICD-10-CM | POA: Diagnosis not present

## 2016-11-16 DIAGNOSIS — M25511 Pain in right shoulder: Secondary | ICD-10-CM | POA: Diagnosis not present

## 2016-11-16 DIAGNOSIS — M109 Gout, unspecified: Secondary | ICD-10-CM | POA: Diagnosis not present

## 2016-11-16 DIAGNOSIS — Z87891 Personal history of nicotine dependence: Secondary | ICD-10-CM | POA: Diagnosis not present

## 2016-11-16 DIAGNOSIS — M1991 Primary osteoarthritis, unspecified site: Secondary | ICD-10-CM | POA: Diagnosis not present

## 2016-11-16 DIAGNOSIS — Z9181 History of falling: Secondary | ICD-10-CM | POA: Diagnosis not present

## 2016-11-16 DIAGNOSIS — I129 Hypertensive chronic kidney disease with stage 1 through stage 4 chronic kidney disease, or unspecified chronic kidney disease: Secondary | ICD-10-CM | POA: Diagnosis not present

## 2016-11-16 DIAGNOSIS — N182 Chronic kidney disease, stage 2 (mild): Secondary | ICD-10-CM | POA: Diagnosis not present

## 2016-11-16 DIAGNOSIS — M4800 Spinal stenosis, site unspecified: Secondary | ICD-10-CM | POA: Diagnosis not present

## 2016-11-21 DIAGNOSIS — M4800 Spinal stenosis, site unspecified: Secondary | ICD-10-CM | POA: Diagnosis not present

## 2016-11-21 DIAGNOSIS — Z87891 Personal history of nicotine dependence: Secondary | ICD-10-CM | POA: Diagnosis not present

## 2016-11-21 DIAGNOSIS — M1991 Primary osteoarthritis, unspecified site: Secondary | ICD-10-CM | POA: Diagnosis not present

## 2016-11-21 DIAGNOSIS — N182 Chronic kidney disease, stage 2 (mild): Secondary | ICD-10-CM | POA: Diagnosis not present

## 2016-11-21 DIAGNOSIS — I129 Hypertensive chronic kidney disease with stage 1 through stage 4 chronic kidney disease, or unspecified chronic kidney disease: Secondary | ICD-10-CM | POA: Diagnosis not present

## 2016-11-21 DIAGNOSIS — Z9181 History of falling: Secondary | ICD-10-CM | POA: Diagnosis not present

## 2016-11-21 DIAGNOSIS — M25511 Pain in right shoulder: Secondary | ICD-10-CM | POA: Diagnosis not present

## 2016-11-21 DIAGNOSIS — M109 Gout, unspecified: Secondary | ICD-10-CM | POA: Diagnosis not present

## 2016-11-22 DIAGNOSIS — M1991 Primary osteoarthritis, unspecified site: Secondary | ICD-10-CM | POA: Diagnosis not present

## 2016-11-22 DIAGNOSIS — I129 Hypertensive chronic kidney disease with stage 1 through stage 4 chronic kidney disease, or unspecified chronic kidney disease: Secondary | ICD-10-CM | POA: Diagnosis not present

## 2016-11-22 DIAGNOSIS — M25511 Pain in right shoulder: Secondary | ICD-10-CM | POA: Diagnosis not present

## 2016-11-22 DIAGNOSIS — M4800 Spinal stenosis, site unspecified: Secondary | ICD-10-CM | POA: Diagnosis not present

## 2016-11-22 DIAGNOSIS — Z87891 Personal history of nicotine dependence: Secondary | ICD-10-CM | POA: Diagnosis not present

## 2016-11-22 DIAGNOSIS — Z9181 History of falling: Secondary | ICD-10-CM | POA: Diagnosis not present

## 2016-11-22 DIAGNOSIS — M109 Gout, unspecified: Secondary | ICD-10-CM | POA: Diagnosis not present

## 2016-11-22 DIAGNOSIS — N182 Chronic kidney disease, stage 2 (mild): Secondary | ICD-10-CM | POA: Diagnosis not present

## 2016-11-23 DIAGNOSIS — M4800 Spinal stenosis, site unspecified: Secondary | ICD-10-CM | POA: Diagnosis not present

## 2016-11-23 DIAGNOSIS — Z9181 History of falling: Secondary | ICD-10-CM | POA: Diagnosis not present

## 2016-11-23 DIAGNOSIS — M109 Gout, unspecified: Secondary | ICD-10-CM | POA: Diagnosis not present

## 2016-11-23 DIAGNOSIS — I129 Hypertensive chronic kidney disease with stage 1 through stage 4 chronic kidney disease, or unspecified chronic kidney disease: Secondary | ICD-10-CM | POA: Diagnosis not present

## 2016-11-23 DIAGNOSIS — N182 Chronic kidney disease, stage 2 (mild): Secondary | ICD-10-CM | POA: Diagnosis not present

## 2016-11-23 DIAGNOSIS — M1991 Primary osteoarthritis, unspecified site: Secondary | ICD-10-CM | POA: Diagnosis not present

## 2016-11-23 DIAGNOSIS — Z87891 Personal history of nicotine dependence: Secondary | ICD-10-CM | POA: Diagnosis not present

## 2016-11-23 DIAGNOSIS — M25511 Pain in right shoulder: Secondary | ICD-10-CM | POA: Diagnosis not present

## 2016-11-27 DIAGNOSIS — Z9181 History of falling: Secondary | ICD-10-CM | POA: Diagnosis not present

## 2016-11-27 DIAGNOSIS — M25511 Pain in right shoulder: Secondary | ICD-10-CM | POA: Diagnosis not present

## 2016-11-27 DIAGNOSIS — M109 Gout, unspecified: Secondary | ICD-10-CM | POA: Diagnosis not present

## 2016-11-27 DIAGNOSIS — Z87891 Personal history of nicotine dependence: Secondary | ICD-10-CM | POA: Diagnosis not present

## 2016-11-27 DIAGNOSIS — M4800 Spinal stenosis, site unspecified: Secondary | ICD-10-CM | POA: Diagnosis not present

## 2016-11-27 DIAGNOSIS — I129 Hypertensive chronic kidney disease with stage 1 through stage 4 chronic kidney disease, or unspecified chronic kidney disease: Secondary | ICD-10-CM | POA: Diagnosis not present

## 2016-11-27 DIAGNOSIS — M1991 Primary osteoarthritis, unspecified site: Secondary | ICD-10-CM | POA: Diagnosis not present

## 2016-11-27 DIAGNOSIS — N182 Chronic kidney disease, stage 2 (mild): Secondary | ICD-10-CM | POA: Diagnosis not present

## 2016-11-29 DIAGNOSIS — Z87891 Personal history of nicotine dependence: Secondary | ICD-10-CM | POA: Diagnosis not present

## 2016-11-29 DIAGNOSIS — N182 Chronic kidney disease, stage 2 (mild): Secondary | ICD-10-CM | POA: Diagnosis not present

## 2016-11-29 DIAGNOSIS — M109 Gout, unspecified: Secondary | ICD-10-CM | POA: Diagnosis not present

## 2016-11-29 DIAGNOSIS — I129 Hypertensive chronic kidney disease with stage 1 through stage 4 chronic kidney disease, or unspecified chronic kidney disease: Secondary | ICD-10-CM | POA: Diagnosis not present

## 2016-11-29 DIAGNOSIS — M1991 Primary osteoarthritis, unspecified site: Secondary | ICD-10-CM | POA: Diagnosis not present

## 2016-11-29 DIAGNOSIS — M4800 Spinal stenosis, site unspecified: Secondary | ICD-10-CM | POA: Diagnosis not present

## 2016-11-29 DIAGNOSIS — Z9181 History of falling: Secondary | ICD-10-CM | POA: Diagnosis not present

## 2016-11-29 DIAGNOSIS — M25511 Pain in right shoulder: Secondary | ICD-10-CM | POA: Diagnosis not present

## 2016-11-30 DIAGNOSIS — M25511 Pain in right shoulder: Secondary | ICD-10-CM | POA: Diagnosis not present

## 2016-12-04 DIAGNOSIS — M25511 Pain in right shoulder: Secondary | ICD-10-CM | POA: Diagnosis not present

## 2016-12-04 DIAGNOSIS — M1991 Primary osteoarthritis, unspecified site: Secondary | ICD-10-CM | POA: Diagnosis not present

## 2016-12-04 DIAGNOSIS — M109 Gout, unspecified: Secondary | ICD-10-CM | POA: Diagnosis not present

## 2016-12-04 DIAGNOSIS — Z9181 History of falling: Secondary | ICD-10-CM | POA: Diagnosis not present

## 2016-12-04 DIAGNOSIS — I129 Hypertensive chronic kidney disease with stage 1 through stage 4 chronic kidney disease, or unspecified chronic kidney disease: Secondary | ICD-10-CM | POA: Diagnosis not present

## 2016-12-04 DIAGNOSIS — M4800 Spinal stenosis, site unspecified: Secondary | ICD-10-CM | POA: Diagnosis not present

## 2016-12-04 DIAGNOSIS — Z87891 Personal history of nicotine dependence: Secondary | ICD-10-CM | POA: Diagnosis not present

## 2016-12-04 DIAGNOSIS — N182 Chronic kidney disease, stage 2 (mild): Secondary | ICD-10-CM | POA: Diagnosis not present

## 2016-12-06 DIAGNOSIS — M1991 Primary osteoarthritis, unspecified site: Secondary | ICD-10-CM | POA: Diagnosis not present

## 2016-12-06 DIAGNOSIS — M4800 Spinal stenosis, site unspecified: Secondary | ICD-10-CM | POA: Diagnosis not present

## 2016-12-06 DIAGNOSIS — M25511 Pain in right shoulder: Secondary | ICD-10-CM | POA: Diagnosis not present

## 2016-12-06 DIAGNOSIS — Z9181 History of falling: Secondary | ICD-10-CM | POA: Diagnosis not present

## 2016-12-06 DIAGNOSIS — I129 Hypertensive chronic kidney disease with stage 1 through stage 4 chronic kidney disease, or unspecified chronic kidney disease: Secondary | ICD-10-CM | POA: Diagnosis not present

## 2016-12-06 DIAGNOSIS — N182 Chronic kidney disease, stage 2 (mild): Secondary | ICD-10-CM | POA: Diagnosis not present

## 2016-12-06 DIAGNOSIS — M109 Gout, unspecified: Secondary | ICD-10-CM | POA: Diagnosis not present

## 2016-12-06 DIAGNOSIS — Z87891 Personal history of nicotine dependence: Secondary | ICD-10-CM | POA: Diagnosis not present

## 2016-12-10 DIAGNOSIS — M109 Gout, unspecified: Secondary | ICD-10-CM | POA: Diagnosis not present

## 2016-12-10 DIAGNOSIS — I129 Hypertensive chronic kidney disease with stage 1 through stage 4 chronic kidney disease, or unspecified chronic kidney disease: Secondary | ICD-10-CM | POA: Diagnosis not present

## 2016-12-10 DIAGNOSIS — M1991 Primary osteoarthritis, unspecified site: Secondary | ICD-10-CM | POA: Diagnosis not present

## 2016-12-10 DIAGNOSIS — N182 Chronic kidney disease, stage 2 (mild): Secondary | ICD-10-CM | POA: Diagnosis not present

## 2016-12-10 DIAGNOSIS — M25511 Pain in right shoulder: Secondary | ICD-10-CM | POA: Diagnosis not present

## 2016-12-10 DIAGNOSIS — Z87891 Personal history of nicotine dependence: Secondary | ICD-10-CM | POA: Diagnosis not present

## 2016-12-10 DIAGNOSIS — Z9181 History of falling: Secondary | ICD-10-CM | POA: Diagnosis not present

## 2016-12-10 DIAGNOSIS — M4800 Spinal stenosis, site unspecified: Secondary | ICD-10-CM | POA: Diagnosis not present

## 2016-12-13 DIAGNOSIS — M25511 Pain in right shoulder: Secondary | ICD-10-CM | POA: Diagnosis not present

## 2016-12-13 DIAGNOSIS — Z9181 History of falling: Secondary | ICD-10-CM | POA: Diagnosis not present

## 2016-12-13 DIAGNOSIS — N182 Chronic kidney disease, stage 2 (mild): Secondary | ICD-10-CM | POA: Diagnosis not present

## 2016-12-13 DIAGNOSIS — M1991 Primary osteoarthritis, unspecified site: Secondary | ICD-10-CM | POA: Diagnosis not present

## 2016-12-13 DIAGNOSIS — Z87891 Personal history of nicotine dependence: Secondary | ICD-10-CM | POA: Diagnosis not present

## 2016-12-13 DIAGNOSIS — I129 Hypertensive chronic kidney disease with stage 1 through stage 4 chronic kidney disease, or unspecified chronic kidney disease: Secondary | ICD-10-CM | POA: Diagnosis not present

## 2016-12-13 DIAGNOSIS — M109 Gout, unspecified: Secondary | ICD-10-CM | POA: Diagnosis not present

## 2016-12-13 DIAGNOSIS — M4800 Spinal stenosis, site unspecified: Secondary | ICD-10-CM | POA: Diagnosis not present

## 2016-12-14 DIAGNOSIS — Z87891 Personal history of nicotine dependence: Secondary | ICD-10-CM | POA: Diagnosis not present

## 2016-12-14 DIAGNOSIS — Z9181 History of falling: Secondary | ICD-10-CM | POA: Diagnosis not present

## 2016-12-14 DIAGNOSIS — I129 Hypertensive chronic kidney disease with stage 1 through stage 4 chronic kidney disease, or unspecified chronic kidney disease: Secondary | ICD-10-CM | POA: Diagnosis not present

## 2016-12-14 DIAGNOSIS — N182 Chronic kidney disease, stage 2 (mild): Secondary | ICD-10-CM | POA: Diagnosis not present

## 2016-12-14 DIAGNOSIS — M109 Gout, unspecified: Secondary | ICD-10-CM | POA: Diagnosis not present

## 2016-12-14 DIAGNOSIS — M25511 Pain in right shoulder: Secondary | ICD-10-CM | POA: Diagnosis not present

## 2016-12-14 DIAGNOSIS — M4800 Spinal stenosis, site unspecified: Secondary | ICD-10-CM | POA: Diagnosis not present

## 2016-12-14 DIAGNOSIS — M1991 Primary osteoarthritis, unspecified site: Secondary | ICD-10-CM | POA: Diagnosis not present

## 2016-12-17 DIAGNOSIS — M25511 Pain in right shoulder: Secondary | ICD-10-CM | POA: Diagnosis not present

## 2016-12-17 DIAGNOSIS — Z87891 Personal history of nicotine dependence: Secondary | ICD-10-CM | POA: Diagnosis not present

## 2016-12-17 DIAGNOSIS — N182 Chronic kidney disease, stage 2 (mild): Secondary | ICD-10-CM | POA: Diagnosis not present

## 2016-12-17 DIAGNOSIS — M109 Gout, unspecified: Secondary | ICD-10-CM | POA: Diagnosis not present

## 2016-12-17 DIAGNOSIS — M4800 Spinal stenosis, site unspecified: Secondary | ICD-10-CM | POA: Diagnosis not present

## 2016-12-17 DIAGNOSIS — Z9181 History of falling: Secondary | ICD-10-CM | POA: Diagnosis not present

## 2016-12-17 DIAGNOSIS — I129 Hypertensive chronic kidney disease with stage 1 through stage 4 chronic kidney disease, or unspecified chronic kidney disease: Secondary | ICD-10-CM | POA: Diagnosis not present

## 2016-12-17 DIAGNOSIS — M1991 Primary osteoarthritis, unspecified site: Secondary | ICD-10-CM | POA: Diagnosis not present

## 2016-12-20 DIAGNOSIS — M25511 Pain in right shoulder: Secondary | ICD-10-CM | POA: Diagnosis not present

## 2016-12-20 DIAGNOSIS — Z87891 Personal history of nicotine dependence: Secondary | ICD-10-CM | POA: Diagnosis not present

## 2016-12-20 DIAGNOSIS — M109 Gout, unspecified: Secondary | ICD-10-CM | POA: Diagnosis not present

## 2016-12-20 DIAGNOSIS — Z9181 History of falling: Secondary | ICD-10-CM | POA: Diagnosis not present

## 2016-12-20 DIAGNOSIS — M1991 Primary osteoarthritis, unspecified site: Secondary | ICD-10-CM | POA: Diagnosis not present

## 2016-12-20 DIAGNOSIS — M4800 Spinal stenosis, site unspecified: Secondary | ICD-10-CM | POA: Diagnosis not present

## 2016-12-20 DIAGNOSIS — I129 Hypertensive chronic kidney disease with stage 1 through stage 4 chronic kidney disease, or unspecified chronic kidney disease: Secondary | ICD-10-CM | POA: Diagnosis not present

## 2016-12-20 DIAGNOSIS — N182 Chronic kidney disease, stage 2 (mild): Secondary | ICD-10-CM | POA: Diagnosis not present

## 2016-12-22 DIAGNOSIS — M1991 Primary osteoarthritis, unspecified site: Secondary | ICD-10-CM | POA: Diagnosis not present

## 2016-12-22 DIAGNOSIS — M25511 Pain in right shoulder: Secondary | ICD-10-CM | POA: Diagnosis not present

## 2016-12-22 DIAGNOSIS — N182 Chronic kidney disease, stage 2 (mild): Secondary | ICD-10-CM | POA: Diagnosis not present

## 2016-12-22 DIAGNOSIS — I129 Hypertensive chronic kidney disease with stage 1 through stage 4 chronic kidney disease, or unspecified chronic kidney disease: Secondary | ICD-10-CM | POA: Diagnosis not present

## 2016-12-22 DIAGNOSIS — Z9181 History of falling: Secondary | ICD-10-CM | POA: Diagnosis not present

## 2016-12-22 DIAGNOSIS — M109 Gout, unspecified: Secondary | ICD-10-CM | POA: Diagnosis not present

## 2016-12-22 DIAGNOSIS — M4800 Spinal stenosis, site unspecified: Secondary | ICD-10-CM | POA: Diagnosis not present

## 2016-12-22 DIAGNOSIS — Z87891 Personal history of nicotine dependence: Secondary | ICD-10-CM | POA: Diagnosis not present

## 2016-12-24 DIAGNOSIS — M25511 Pain in right shoulder: Secondary | ICD-10-CM | POA: Diagnosis not present

## 2016-12-24 DIAGNOSIS — M4800 Spinal stenosis, site unspecified: Secondary | ICD-10-CM | POA: Diagnosis not present

## 2016-12-24 DIAGNOSIS — I129 Hypertensive chronic kidney disease with stage 1 through stage 4 chronic kidney disease, or unspecified chronic kidney disease: Secondary | ICD-10-CM | POA: Diagnosis not present

## 2016-12-24 DIAGNOSIS — M1991 Primary osteoarthritis, unspecified site: Secondary | ICD-10-CM | POA: Diagnosis not present

## 2016-12-24 DIAGNOSIS — N182 Chronic kidney disease, stage 2 (mild): Secondary | ICD-10-CM | POA: Diagnosis not present

## 2016-12-24 DIAGNOSIS — Z9181 History of falling: Secondary | ICD-10-CM | POA: Diagnosis not present

## 2016-12-24 DIAGNOSIS — Z87891 Personal history of nicotine dependence: Secondary | ICD-10-CM | POA: Diagnosis not present

## 2016-12-24 DIAGNOSIS — M109 Gout, unspecified: Secondary | ICD-10-CM | POA: Diagnosis not present

## 2016-12-25 DIAGNOSIS — I129 Hypertensive chronic kidney disease with stage 1 through stage 4 chronic kidney disease, or unspecified chronic kidney disease: Secondary | ICD-10-CM | POA: Diagnosis not present

## 2016-12-25 DIAGNOSIS — N182 Chronic kidney disease, stage 2 (mild): Secondary | ICD-10-CM | POA: Diagnosis not present

## 2016-12-25 DIAGNOSIS — Z87891 Personal history of nicotine dependence: Secondary | ICD-10-CM | POA: Diagnosis not present

## 2016-12-25 DIAGNOSIS — M25511 Pain in right shoulder: Secondary | ICD-10-CM | POA: Diagnosis not present

## 2016-12-25 DIAGNOSIS — M109 Gout, unspecified: Secondary | ICD-10-CM | POA: Diagnosis not present

## 2016-12-25 DIAGNOSIS — M4800 Spinal stenosis, site unspecified: Secondary | ICD-10-CM | POA: Diagnosis not present

## 2016-12-25 DIAGNOSIS — M1991 Primary osteoarthritis, unspecified site: Secondary | ICD-10-CM | POA: Diagnosis not present

## 2016-12-25 DIAGNOSIS — Z9181 History of falling: Secondary | ICD-10-CM | POA: Diagnosis not present

## 2016-12-27 DIAGNOSIS — N182 Chronic kidney disease, stage 2 (mild): Secondary | ICD-10-CM | POA: Diagnosis not present

## 2016-12-27 DIAGNOSIS — I129 Hypertensive chronic kidney disease with stage 1 through stage 4 chronic kidney disease, or unspecified chronic kidney disease: Secondary | ICD-10-CM | POA: Diagnosis not present

## 2016-12-27 DIAGNOSIS — M109 Gout, unspecified: Secondary | ICD-10-CM | POA: Diagnosis not present

## 2016-12-27 DIAGNOSIS — M4800 Spinal stenosis, site unspecified: Secondary | ICD-10-CM | POA: Diagnosis not present

## 2016-12-27 DIAGNOSIS — M25511 Pain in right shoulder: Secondary | ICD-10-CM | POA: Diagnosis not present

## 2016-12-27 DIAGNOSIS — Z9181 History of falling: Secondary | ICD-10-CM | POA: Diagnosis not present

## 2016-12-27 DIAGNOSIS — Z87891 Personal history of nicotine dependence: Secondary | ICD-10-CM | POA: Diagnosis not present

## 2016-12-27 DIAGNOSIS — M1991 Primary osteoarthritis, unspecified site: Secondary | ICD-10-CM | POA: Diagnosis not present

## 2016-12-31 DIAGNOSIS — M25511 Pain in right shoulder: Secondary | ICD-10-CM | POA: Diagnosis not present

## 2016-12-31 DIAGNOSIS — Z9181 History of falling: Secondary | ICD-10-CM | POA: Diagnosis not present

## 2016-12-31 DIAGNOSIS — M1991 Primary osteoarthritis, unspecified site: Secondary | ICD-10-CM | POA: Diagnosis not present

## 2016-12-31 DIAGNOSIS — N182 Chronic kidney disease, stage 2 (mild): Secondary | ICD-10-CM | POA: Diagnosis not present

## 2016-12-31 DIAGNOSIS — M4800 Spinal stenosis, site unspecified: Secondary | ICD-10-CM | POA: Diagnosis not present

## 2016-12-31 DIAGNOSIS — I129 Hypertensive chronic kidney disease with stage 1 through stage 4 chronic kidney disease, or unspecified chronic kidney disease: Secondary | ICD-10-CM | POA: Diagnosis not present

## 2016-12-31 DIAGNOSIS — M109 Gout, unspecified: Secondary | ICD-10-CM | POA: Diagnosis not present

## 2016-12-31 DIAGNOSIS — Z87891 Personal history of nicotine dependence: Secondary | ICD-10-CM | POA: Diagnosis not present

## 2017-01-01 DIAGNOSIS — M25511 Pain in right shoulder: Secondary | ICD-10-CM | POA: Diagnosis not present

## 2017-01-01 DIAGNOSIS — M1991 Primary osteoarthritis, unspecified site: Secondary | ICD-10-CM | POA: Diagnosis not present

## 2017-01-01 DIAGNOSIS — I129 Hypertensive chronic kidney disease with stage 1 through stage 4 chronic kidney disease, or unspecified chronic kidney disease: Secondary | ICD-10-CM | POA: Diagnosis not present

## 2017-01-01 DIAGNOSIS — N182 Chronic kidney disease, stage 2 (mild): Secondary | ICD-10-CM | POA: Diagnosis not present

## 2017-01-01 DIAGNOSIS — M4800 Spinal stenosis, site unspecified: Secondary | ICD-10-CM | POA: Diagnosis not present

## 2017-01-01 DIAGNOSIS — M109 Gout, unspecified: Secondary | ICD-10-CM | POA: Diagnosis not present

## 2017-01-01 DIAGNOSIS — Z9181 History of falling: Secondary | ICD-10-CM | POA: Diagnosis not present

## 2017-01-01 DIAGNOSIS — Z87891 Personal history of nicotine dependence: Secondary | ICD-10-CM | POA: Diagnosis not present

## 2017-01-03 DIAGNOSIS — I129 Hypertensive chronic kidney disease with stage 1 through stage 4 chronic kidney disease, or unspecified chronic kidney disease: Secondary | ICD-10-CM | POA: Diagnosis not present

## 2017-01-03 DIAGNOSIS — Z87891 Personal history of nicotine dependence: Secondary | ICD-10-CM | POA: Diagnosis not present

## 2017-01-03 DIAGNOSIS — Z9181 History of falling: Secondary | ICD-10-CM | POA: Diagnosis not present

## 2017-01-03 DIAGNOSIS — M109 Gout, unspecified: Secondary | ICD-10-CM | POA: Diagnosis not present

## 2017-01-03 DIAGNOSIS — M25511 Pain in right shoulder: Secondary | ICD-10-CM | POA: Diagnosis not present

## 2017-01-03 DIAGNOSIS — M4800 Spinal stenosis, site unspecified: Secondary | ICD-10-CM | POA: Diagnosis not present

## 2017-01-03 DIAGNOSIS — N182 Chronic kidney disease, stage 2 (mild): Secondary | ICD-10-CM | POA: Diagnosis not present

## 2017-01-03 DIAGNOSIS — M1991 Primary osteoarthritis, unspecified site: Secondary | ICD-10-CM | POA: Diagnosis not present

## 2017-01-10 DIAGNOSIS — Z87891 Personal history of nicotine dependence: Secondary | ICD-10-CM | POA: Diagnosis not present

## 2017-01-10 DIAGNOSIS — I129 Hypertensive chronic kidney disease with stage 1 through stage 4 chronic kidney disease, or unspecified chronic kidney disease: Secondary | ICD-10-CM | POA: Diagnosis not present

## 2017-01-10 DIAGNOSIS — M1991 Primary osteoarthritis, unspecified site: Secondary | ICD-10-CM | POA: Diagnosis not present

## 2017-01-10 DIAGNOSIS — Z9181 History of falling: Secondary | ICD-10-CM | POA: Diagnosis not present

## 2017-01-10 DIAGNOSIS — M109 Gout, unspecified: Secondary | ICD-10-CM | POA: Diagnosis not present

## 2017-01-10 DIAGNOSIS — M4800 Spinal stenosis, site unspecified: Secondary | ICD-10-CM | POA: Diagnosis not present

## 2017-01-10 DIAGNOSIS — N182 Chronic kidney disease, stage 2 (mild): Secondary | ICD-10-CM | POA: Diagnosis not present

## 2017-01-10 DIAGNOSIS — M25511 Pain in right shoulder: Secondary | ICD-10-CM | POA: Diagnosis not present

## 2017-01-11 DIAGNOSIS — M1991 Primary osteoarthritis, unspecified site: Secondary | ICD-10-CM | POA: Diagnosis not present

## 2017-01-11 DIAGNOSIS — Z87891 Personal history of nicotine dependence: Secondary | ICD-10-CM | POA: Diagnosis not present

## 2017-01-11 DIAGNOSIS — M25511 Pain in right shoulder: Secondary | ICD-10-CM | POA: Diagnosis not present

## 2017-01-11 DIAGNOSIS — Z9181 History of falling: Secondary | ICD-10-CM | POA: Diagnosis not present

## 2017-01-11 DIAGNOSIS — N182 Chronic kidney disease, stage 2 (mild): Secondary | ICD-10-CM | POA: Diagnosis not present

## 2017-01-11 DIAGNOSIS — M109 Gout, unspecified: Secondary | ICD-10-CM | POA: Diagnosis not present

## 2017-01-11 DIAGNOSIS — M4800 Spinal stenosis, site unspecified: Secondary | ICD-10-CM | POA: Diagnosis not present

## 2017-01-11 DIAGNOSIS — I129 Hypertensive chronic kidney disease with stage 1 through stage 4 chronic kidney disease, or unspecified chronic kidney disease: Secondary | ICD-10-CM | POA: Diagnosis not present

## 2017-01-15 ENCOUNTER — Ambulatory Visit: Payer: Medicare Other | Admitting: Orthopaedic Surgery

## 2017-01-15 DIAGNOSIS — N182 Chronic kidney disease, stage 2 (mild): Secondary | ICD-10-CM | POA: Diagnosis not present

## 2017-01-15 DIAGNOSIS — Z87891 Personal history of nicotine dependence: Secondary | ICD-10-CM | POA: Diagnosis not present

## 2017-01-15 DIAGNOSIS — M25511 Pain in right shoulder: Secondary | ICD-10-CM | POA: Diagnosis not present

## 2017-01-15 DIAGNOSIS — M109 Gout, unspecified: Secondary | ICD-10-CM | POA: Diagnosis not present

## 2017-01-15 DIAGNOSIS — Z9181 History of falling: Secondary | ICD-10-CM | POA: Diagnosis not present

## 2017-01-15 DIAGNOSIS — M1991 Primary osteoarthritis, unspecified site: Secondary | ICD-10-CM | POA: Diagnosis not present

## 2017-01-15 DIAGNOSIS — I129 Hypertensive chronic kidney disease with stage 1 through stage 4 chronic kidney disease, or unspecified chronic kidney disease: Secondary | ICD-10-CM | POA: Diagnosis not present

## 2017-01-15 DIAGNOSIS — M4800 Spinal stenosis, site unspecified: Secondary | ICD-10-CM | POA: Diagnosis not present

## 2017-01-16 ENCOUNTER — Ambulatory Visit: Payer: Medicare Other | Admitting: Orthopaedic Surgery

## 2017-01-16 ENCOUNTER — Encounter: Payer: Self-pay | Admitting: Orthopaedic Surgery

## 2017-01-16 VITALS — BP 117/60 | HR 91 | Temp 97.4°F | Ht 60.0 in | Wt 127.0 lb

## 2017-01-16 DIAGNOSIS — M25561 Pain in right knee: Secondary | ICD-10-CM

## 2017-01-16 DIAGNOSIS — G8929 Other chronic pain: Secondary | ICD-10-CM | POA: Diagnosis not present

## 2017-01-16 DIAGNOSIS — M6281 Muscle weakness (generalized): Secondary | ICD-10-CM | POA: Diagnosis not present

## 2017-01-16 DIAGNOSIS — M25511 Pain in right shoulder: Secondary | ICD-10-CM

## 2017-01-16 NOTE — Progress Notes (Signed)
Patient Lori Bishop Despina Hick, female DOB:1920-12-15, 83 y.o. VWU:[id number]  Chief Complaint  Patient presents with  . New Problem    Right Shoulder pain d/t fall in September    HPI  Lori Bishop is a 83 y.o. female who has chronic pain of both knees.  She fell in September and hurt her right shoulder.  She had x-rays done 11-13-16 and they were negative.  She has seen Dr. Felecia Shelling.  She continues to have pain.  She has pain with overhead use.  She has pain at night.  She has no new trauma, no swelling and no numbness.  The knees are still tender and have swelling and popping.  She has no new trauma.  She uses a walker. HPI  Body mass index is 24.8 kg/m.  ROS  Review of Systems  HENT: Negative for congestion.   Respiratory: Negative for cough and shortness of breath.   Cardiovascular: Negative for chest pain and leg swelling.  Endocrine: Positive for cold intolerance.  Musculoskeletal: Positive for arthralgias, back pain, gait problem and joint swelling.  Allergic/Immunologic: Positive for environmental allergies.   Past Medical History:  Diagnosis Date  . Bilateral chronic knee pain   . Carpal tunnel syndrome, bilateral   . Choledocholithiasis 3 2014 and 5 2014, Dec 2014   S/P ERCP and sphincterotomy x3  . Chronic back pain   . Gout   . Hypertension   . Leg cramps   . Osteoarthritis     Past Surgical History:  Procedure Laterality Date  . APPENDECTOMY    . BREAST BIOPSY Left   . CARPAL TUNNEL RELEASE Right   . CATARACT EXTRACTION Bilateral   . CHOLECYSTECTOMY    . right femur     metal plate  . WRIST ARTHROPLASTY Right     Current Outpatient Medications on File Prior to Visit  Medication Sig Dispense Refill  . allopurinol (ZYLOPRIM) 100 MG tablet Take 100 mg by mouth daily.    . cephALEXin (KEFLEX) 500 MG capsule Take 1 capsule (500 mg total) by mouth 3 (three) times daily. 30 capsule 0  . felodipine (PLENDIL) 10 MG 24 hr tablet Take 10 mg by mouth daily.    .  furosemide (LASIX) 40 MG tablet Take 40 mg by mouth 2 (two) times daily as needed for fluid or edema.     . gabapentin (NEURONTIN) 100 MG capsule Take 100 mg by mouth 3 (three) times daily.    Marland Kitchen ibuprofen (ADVIL,MOTRIN) 200 MG tablet Take 200 mg by mouth every 6 (six) hours as needed.    Marland Kitchen levofloxacin (LEVAQUIN) 250 MG tablet Take 2 tablets (500 mg total) by mouth daily. 7 tablet 0  . levofloxacin (LEVAQUIN) 250 MG tablet Take 1 tablet (250 mg total) by mouth daily. 7 tablet 0  . ondansetron (ZOFRAN) 4 MG tablet Take 1 tablet (4 mg total) by mouth every 6 (six) hours as needed for nausea. 12 tablet 0  . oxyCODONE-acetaminophen (PERCOCET) 5-325 MG tablet Take 1-2 tablets by mouth every 4 (four) hours as needed for moderate pain. 20 tablet 0  . pantoprazole (PROTONIX) 40 MG tablet Take 1 tablet (40 mg total) by mouth daily before breakfast. 30 tablet 3  . polyethylene glycol (MIRALAX / GLYCOLAX) packet Take 17 g by mouth daily as needed for moderate constipation.    . potassium chloride SA (K-DUR,KLOR-CON) 20 MEQ tablet Take 40 mEq by mouth daily.     . traMADol (ULTRAM) 50 MG tablet Take 1 tablet (  50 mg total) by mouth every 6 (six) hours as needed. 20 tablet 0   No current facility-administered medications on file prior to visit.     Social History   Socioeconomic History  . Marital status: Widowed    Spouse name: Not on file  . Number of children: 1  . Years of education: College  . Highest education level: Not on file  Social Needs  . Financial resource strain: Not on file  . Food insecurity - worry: Not on file  . Food insecurity - inability: Not on file  . Transportation needs - medical: Not on file  . Transportation needs - non-medical: Not on file  Occupational History  . Occupation: retired: Charity fundraiser[employer]    Employer: RETIRED  Tobacco Use  . Smoking status: Former Smoker    Packs/day: 0.25    Years: 15.00    Pack years: 3.75    Types: Cigarettes    Last attempt to quit:  07/17/1988    Years since quitting: 28.5  . Smokeless tobacco: Never Used  Substance and Sexual Activity  . Alcohol use: No  . Drug use: No  . Sexual activity: No    Birth control/protection: Post-menopausal  Other Topics Concern  . Not on file  Social History Narrative   Patient is single with one child.   Patient is right handed.   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    Lori Bishop, niece & caregiver, as well as POA          Family History  Problem Relation Age of Onset  . CAD Mother   . Stroke Father   . Cancer Son        ?metastatic  . Colon cancer Neg Hx     BP 117/60   Pulse 91   Temp (!) 97.4 F (36.3 C)   Ht 5' (1.524 m)   Wt 127 lb (57.6 kg)   BMI 24.80 kg/m   Past Medical History:  Diagnosis Date  . Bilateral chronic knee pain   . Carpal tunnel syndrome, bilateral   . Choledocholithiasis 3 2014 and 5 2014, Dec 2014   S/P ERCP and sphincterotomy x3  . Chronic back pain   . Gout   . Hypertension   . Leg cramps   . Osteoarthritis     Past Surgical History:  Procedure Laterality Date  . APPENDECTOMY    . BREAST BIOPSY Left   . CARPAL TUNNEL RELEASE Right   . CATARACT EXTRACTION Bilateral   . CHOLECYSTECTOMY    . right femur     metal plate  . WRIST ARTHROPLASTY Right     Family History  Problem Relation Age of Onset  . CAD Mother   . Stroke Father   . Cancer Son        ?metastatic  . Colon cancer Neg Hx     Social History Social History   Tobacco Use  . Smoking status: Former Smoker    Packs/day: 0.25    Years: 15.00    Pack years: 3.75    Types: Cigarettes    Last attempt to quit: 07/17/1988    Years since quitting: 28.5  . Smokeless tobacco: Never Used  Substance Use Topics  . Alcohol use: No  . Drug use: No    No Known Allergies  Current Outpatient Medications  Medication Sig Dispense Refill  . allopurinol (ZYLOPRIM) 100 MG tablet Take 100 mg by mouth daily.    .Marland Kitchen  cephALEXin (KEFLEX) 500 MG capsule Take 1  capsule (500 mg total) by mouth 3 (three) times daily. 30 capsule 0  . felodipine (PLENDIL) 10 MG 24 hr tablet Take 10 mg by mouth daily.    . furosemide (LASIX) 40 MG tablet Take 40 mg by mouth 2 (two) times daily as needed for fluid or edema.     . gabapentin (NEURONTIN) 100 MG capsule Take 100 mg by mouth 3 (three) times daily.    Marland Kitchen. ibuprofen (ADVIL,MOTRIN) 200 MG tablet Take 200 mg by mouth every 6 (six) hours as needed.    Marland Kitchen. levofloxacin (LEVAQUIN) 250 MG tablet Take 2 tablets (500 mg total) by mouth daily. 7 tablet 0  . levofloxacin (LEVAQUIN) 250 MG tablet Take 1 tablet (250 mg total) by mouth daily. 7 tablet 0  . ondansetron (ZOFRAN) 4 MG tablet Take 1 tablet (4 mg total) by mouth every 6 (six) hours as needed for nausea. 12 tablet 0  . oxyCODONE-acetaminophen (PERCOCET) 5-325 MG tablet Take 1-2 tablets by mouth every 4 (four) hours as needed for moderate pain. 20 tablet 0  . pantoprazole (PROTONIX) 40 MG tablet Take 1 tablet (40 mg total) by mouth daily before breakfast. 30 tablet 3  . polyethylene glycol (MIRALAX / GLYCOLAX) packet Take 17 g by mouth daily as needed for moderate constipation.    . potassium chloride SA (K-DUR,KLOR-CON) 20 MEQ tablet Take 40 mEq by mouth daily.     . traMADol (ULTRAM) 50 MG tablet Take 1 tablet (50 mg total) by mouth every 6 (six) hours as needed. 20 tablet 0   No current facility-administered medications for this visit.      Physical Exam  Blood pressure 117/60, pulse 91, temperature (!) 97.4 F (36.3 C), height 5' (1.524 m), weight 127 lb (57.6 kg).  Constitutional: overall normal hygiene, normal nutrition, well developed, normal grooming, normal body habitus. Assistive device:walker  Musculoskeletal: gait and station Limp right, muscle tone and strength are normal, no tremors or atrophy is present.  .  Neurological: coordination overall normal.  Deep tendon reflex/nerve stretch intact.  Sensation normal.  Cranial nerves II-XII intact.    Skin:   Normal overall no scars, lesions, ulcers or rashes. No psoriasis.  Psychiatric: Alert and oriented x 3.  Recent memory intact, remote memory unclear.  Normal mood and affect. Well groomed.  Good eye contact.  Cardiovascular: overall no swelling, no varicosities, no edema bilaterally, normal temperatures of the legs and arms, no clubbing, cyanosis and good capillary refill.  Lymphatic: palpation is normal.  All other systems reviewed and are negative   Examination of right Upper Extremity is done.  Inspection:   Overall:  Elbow non-tender without crepitus or defects, forearm non-tender without crepitus or defects, wrist non-tender without crepitus or defects, hand non-tender.    Shoulder: with glenohumeral joint tenderness, without effusion.   Upper arm: without swelling and tenderness   Range of motion:   Overall:  Full range of motion of the elbow, full range of motion of wrist and full range of motion in fingers.   Shoulder:  right  150 degrees forward flexion; 135 degrees abduction; 30 degrees internal rotation, 30 degrees external rotation, 15 degrees extension, 40 degrees adduction.   Stability:   Overall:  Shoulder, elbow and wrist stable   Strength and Tone:   Overall full shoulder muscles strength, full upper arm strength and normal upper arm bulk and tone.  The right lower extremity is examined:  Inspection:  Thigh:  Non-tender and no defects  Knee has swelling 1+ effusion.                        Joint tenderness is present                        Patient is tender over the medial joint line  Lower Leg:  Has normal appearance and no tenderness or defects  Ankle:  Non-tender and no defects  Foot:  Non-tender and no defects Range of Motion:  Knee:  Range of motion is: 0-100                        Crepitus is  present  Ankle:  Range of motion is normal. Strength and Tone:  The right lower extremity has normal strength and tone. Stability:  Knee:  The knee  is stable.  Ankle:  The ankle is stable.   The patient has been educated about the nature of the problem(s) and counseled on treatment options.  The patient appeared to understand what I have discussed and is in agreement with it.  Encounter Diagnoses  Name Primary?  . Chronic right shoulder pain Yes  . Chronic pain of right knee    PROCEDURE NOTE:  The patient request injection, verbal consent was obtained.  The right shoulder was prepped appropriately after time out was performed.   Sterile technique was observed and injection of 1 cc of Depo-Medrol 40 mg with several cc's of plain xylocaine. Anesthesia was provided by ethyl chloride and a 20-gauge needle was used to inject the shoulder area. A posterior approach was used.  The injection was tolerated well.  A band aid dressing was applied.  The patient was advised to apply ice later today and tomorrow to the injection sight as needed.  PROCEDURE NOTE:  The patient requests injections of the right knee , verbal consent was obtained.  The right knee was prepped appropriately after time out was performed.   Sterile technique was observed and injection of 1 cc of Depo-Medrol 40 mg with several cc's of plain xylocaine. Anesthesia was provided by ethyl chloride and a 20-gauge needle was used to inject the knee area. The injection was tolerated well.  A band aid dressing was applied.  The patient was advised to apply ice later today and tomorrow to the injection sight as needed.   PLAN Call if any problems.  Precautions discussed.  Continue current medications.   Return to clinic 1 month   Electronically Signed Darreld Mclean, MD 11/14/20189:56 AM

## 2017-01-17 DIAGNOSIS — N182 Chronic kidney disease, stage 2 (mild): Secondary | ICD-10-CM | POA: Diagnosis not present

## 2017-01-17 DIAGNOSIS — Z9181 History of falling: Secondary | ICD-10-CM | POA: Diagnosis not present

## 2017-01-17 DIAGNOSIS — Z87891 Personal history of nicotine dependence: Secondary | ICD-10-CM | POA: Diagnosis not present

## 2017-01-17 DIAGNOSIS — M109 Gout, unspecified: Secondary | ICD-10-CM | POA: Diagnosis not present

## 2017-01-17 DIAGNOSIS — M4800 Spinal stenosis, site unspecified: Secondary | ICD-10-CM | POA: Diagnosis not present

## 2017-01-17 DIAGNOSIS — M25511 Pain in right shoulder: Secondary | ICD-10-CM | POA: Diagnosis not present

## 2017-01-17 DIAGNOSIS — I10 Essential (primary) hypertension: Secondary | ICD-10-CM | POA: Diagnosis not present

## 2017-01-17 DIAGNOSIS — M1991 Primary osteoarthritis, unspecified site: Secondary | ICD-10-CM | POA: Diagnosis not present

## 2017-01-17 DIAGNOSIS — I129 Hypertensive chronic kidney disease with stage 1 through stage 4 chronic kidney disease, or unspecified chronic kidney disease: Secondary | ICD-10-CM | POA: Diagnosis not present

## 2017-01-17 DIAGNOSIS — K219 Gastro-esophageal reflux disease without esophagitis: Secondary | ICD-10-CM | POA: Diagnosis not present

## 2017-01-19 DIAGNOSIS — M1991 Primary osteoarthritis, unspecified site: Secondary | ICD-10-CM | POA: Diagnosis not present

## 2017-01-19 DIAGNOSIS — M25511 Pain in right shoulder: Secondary | ICD-10-CM | POA: Diagnosis not present

## 2017-01-19 DIAGNOSIS — M4800 Spinal stenosis, site unspecified: Secondary | ICD-10-CM | POA: Diagnosis not present

## 2017-01-19 DIAGNOSIS — Z87891 Personal history of nicotine dependence: Secondary | ICD-10-CM | POA: Diagnosis not present

## 2017-01-19 DIAGNOSIS — I129 Hypertensive chronic kidney disease with stage 1 through stage 4 chronic kidney disease, or unspecified chronic kidney disease: Secondary | ICD-10-CM | POA: Diagnosis not present

## 2017-01-19 DIAGNOSIS — M109 Gout, unspecified: Secondary | ICD-10-CM | POA: Diagnosis not present

## 2017-01-19 DIAGNOSIS — Z9181 History of falling: Secondary | ICD-10-CM | POA: Diagnosis not present

## 2017-01-19 DIAGNOSIS — N182 Chronic kidney disease, stage 2 (mild): Secondary | ICD-10-CM | POA: Diagnosis not present

## 2017-01-20 DIAGNOSIS — Z87891 Personal history of nicotine dependence: Secondary | ICD-10-CM | POA: Diagnosis not present

## 2017-01-20 DIAGNOSIS — M1991 Primary osteoarthritis, unspecified site: Secondary | ICD-10-CM | POA: Diagnosis not present

## 2017-01-20 DIAGNOSIS — Z9181 History of falling: Secondary | ICD-10-CM | POA: Diagnosis not present

## 2017-01-20 DIAGNOSIS — M4800 Spinal stenosis, site unspecified: Secondary | ICD-10-CM | POA: Diagnosis not present

## 2017-01-20 DIAGNOSIS — M25511 Pain in right shoulder: Secondary | ICD-10-CM | POA: Diagnosis not present

## 2017-01-20 DIAGNOSIS — I129 Hypertensive chronic kidney disease with stage 1 through stage 4 chronic kidney disease, or unspecified chronic kidney disease: Secondary | ICD-10-CM | POA: Diagnosis not present

## 2017-01-20 DIAGNOSIS — M109 Gout, unspecified: Secondary | ICD-10-CM | POA: Diagnosis not present

## 2017-01-20 DIAGNOSIS — N182 Chronic kidney disease, stage 2 (mild): Secondary | ICD-10-CM | POA: Diagnosis not present

## 2017-01-31 ENCOUNTER — Ambulatory Visit (INDEPENDENT_AMBULATORY_CARE_PROVIDER_SITE_OTHER): Payer: Medicare Other | Admitting: Otolaryngology

## 2017-01-31 DIAGNOSIS — H6123 Impacted cerumen, bilateral: Secondary | ICD-10-CM | POA: Diagnosis not present

## 2017-02-05 ENCOUNTER — Emergency Department (HOSPITAL_COMMUNITY): Payer: Medicare Other

## 2017-02-05 ENCOUNTER — Encounter (HOSPITAL_COMMUNITY): Payer: Self-pay | Admitting: Emergency Medicine

## 2017-02-05 ENCOUNTER — Emergency Department (HOSPITAL_COMMUNITY)
Admission: EM | Admit: 2017-02-05 | Discharge: 2017-02-05 | Disposition: A | Discharge status: Home / Self Care | Payer: Medicare Other | Attending: Emergency Medicine | Admitting: Emergency Medicine

## 2017-02-05 DIAGNOSIS — E785 Hyperlipidemia, unspecified: Secondary | ICD-10-CM | POA: Diagnosis not present

## 2017-02-05 DIAGNOSIS — M109 Gout, unspecified: Secondary | ICD-10-CM | POA: Diagnosis not present

## 2017-02-05 DIAGNOSIS — M25511 Pain in right shoulder: Secondary | ICD-10-CM | POA: Diagnosis not present

## 2017-02-05 DIAGNOSIS — M1991 Primary osteoarthritis, unspecified site: Secondary | ICD-10-CM | POA: Diagnosis not present

## 2017-02-05 DIAGNOSIS — R1084 Generalized abdominal pain: Secondary | ICD-10-CM | POA: Insufficient documentation

## 2017-02-05 DIAGNOSIS — R109 Unspecified abdominal pain: Secondary | ICD-10-CM | POA: Diagnosis not present

## 2017-02-05 DIAGNOSIS — Z9181 History of falling: Secondary | ICD-10-CM | POA: Diagnosis not present

## 2017-02-05 DIAGNOSIS — Z87891 Personal history of nicotine dependence: Secondary | ICD-10-CM | POA: Diagnosis not present

## 2017-02-05 DIAGNOSIS — R110 Nausea: Secondary | ICD-10-CM | POA: Diagnosis not present

## 2017-02-05 DIAGNOSIS — N182 Chronic kidney disease, stage 2 (mild): Secondary | ICD-10-CM | POA: Diagnosis not present

## 2017-02-05 DIAGNOSIS — Z79899 Other long term (current) drug therapy: Secondary | ICD-10-CM | POA: Diagnosis not present

## 2017-02-05 DIAGNOSIS — M546 Pain in thoracic spine: Secondary | ICD-10-CM | POA: Diagnosis not present

## 2017-02-05 DIAGNOSIS — M4800 Spinal stenosis, site unspecified: Secondary | ICD-10-CM | POA: Diagnosis not present

## 2017-02-05 DIAGNOSIS — I129 Hypertensive chronic kidney disease with stage 1 through stage 4 chronic kidney disease, or unspecified chronic kidney disease: Secondary | ICD-10-CM | POA: Diagnosis not present

## 2017-02-05 LAB — CBC WITH DIFFERENTIAL/PLATELET
BASOS ABS: 0 10*3/uL (ref 0.0–0.1)
BASOS PCT: 0 %
Eosinophils Absolute: 0.3 10*3/uL (ref 0.0–0.7)
Eosinophils Relative: 2 %
HEMATOCRIT: 46.2 % — AB (ref 36.0–46.0)
Hemoglobin: 15.4 g/dL — ABNORMAL HIGH (ref 12.0–15.0)
Lymphocytes Relative: 22 %
Lymphs Abs: 3.4 10*3/uL (ref 0.7–4.0)
MCH: 28.1 pg (ref 26.0–34.0)
MCHC: 33.3 g/dL (ref 30.0–36.0)
MCV: 84.3 fL (ref 78.0–100.0)
MONO ABS: 0.9 10*3/uL (ref 0.1–1.0)
Monocytes Relative: 6 %
NEUTROS ABS: 10.8 10*3/uL — AB (ref 1.7–7.7)
Neutrophils Relative %: 70 %
PLATELETS: 305 10*3/uL (ref 150–400)
RBC: 5.48 MIL/uL — ABNORMAL HIGH (ref 3.87–5.11)
RDW: 15.6 % — AB (ref 11.5–15.5)
WBC: 15.5 10*3/uL — AB (ref 4.0–10.5)

## 2017-02-05 LAB — COMPREHENSIVE METABOLIC PANEL
ALBUMIN: 3.6 g/dL (ref 3.5–5.0)
ALT: 12 U/L — ABNORMAL LOW (ref 14–54)
AST: 25 U/L (ref 15–41)
Alkaline Phosphatase: 144 U/L — ABNORMAL HIGH (ref 38–126)
Anion gap: 8 (ref 5–15)
BILIRUBIN TOTAL: 0.9 mg/dL (ref 0.3–1.2)
BUN: 15 mg/dL (ref 6–20)
CHLORIDE: 103 mmol/L (ref 101–111)
CO2: 24 mmol/L (ref 22–32)
Calcium: 9.5 mg/dL (ref 8.9–10.3)
Creatinine, Ser: 1.21 mg/dL — ABNORMAL HIGH (ref 0.44–1.00)
GFR calc Af Amer: 43 mL/min — ABNORMAL LOW (ref >60)
GFR calc non Af Amer: 37 mL/min — ABNORMAL LOW (ref >60)
GLUCOSE: 105 mg/dL — AB (ref 65–99)
POTASSIUM: 3.9 mmol/L (ref 3.5–5.1)
Sodium: 135 mmol/L (ref 135–145)
Total Protein: 7.3 g/dL (ref 6.5–8.1)

## 2017-02-05 LAB — URINALYSIS, ROUTINE W REFLEX MICROSCOPIC
Bilirubin Urine: NEGATIVE
GLUCOSE, UA: NEGATIVE mg/dL
Hgb urine dipstick: NEGATIVE
KETONES UR: NEGATIVE mg/dL
Leukocytes, UA: NEGATIVE
Nitrite: NEGATIVE
PROTEIN: NEGATIVE mg/dL
Specific Gravity, Urine: 1.011 (ref 1.005–1.030)
pH: 5 (ref 5.0–8.0)

## 2017-02-05 LAB — LIPASE, BLOOD: Lipase: 37 U/L (ref 11–51)

## 2017-02-05 MED ORDER — MORPHINE SULFATE (PF) 2 MG/ML IV SOLN
2.0000 mg | Freq: Once | INTRAVENOUS | Status: AC
  Administered 2017-02-05: 2 mg via INTRAVENOUS
  Filled 2017-02-05: qty 1

## 2017-02-05 MED ORDER — ONDANSETRON HCL 4 MG/2ML IJ SOLN
4.0000 mg | Freq: Once | INTRAMUSCULAR | Status: AC
  Administered 2017-02-05: 4 mg via INTRAVENOUS
  Filled 2017-02-05: qty 2

## 2017-02-05 MED ORDER — TRAMADOL HCL 50 MG PO TABS: 50.0000 mg | ORAL_TABLET | Freq: Four times a day (QID) | ORAL | 0 refills | Status: DC | PRN

## 2017-02-05 NOTE — ED Provider Notes (Signed)
[facility] EMERGENCY DEPARTMENT Provider Note   CSN: [id number] Arrival date & time: 02/05/17  4098     History   Chief Complaint Chief Complaint  Patient presents with  . Flank Pain    HPI Lori Bishop is a 83 y.o. female.  Patient presents to the emergency department for evaluation of right flank pain.  She reports that she was awakened 2 hours ago with pain in the right flank.  It is constant and 6-8 out of 10.  She has not had fever, nausea, vomiting or diarrhea.  Patient reports that she had similar pain years ago when she had a urinary tract infection.  She has noticed decreased urine output since yesterday, no dysuria or hematuria.  No history of kidney stones.  She is not experiencing any chest pain.  There is no shortness of breath.  She does not have any abdominal pain.      Past Medical History:  Diagnosis Date  . Bilateral chronic knee pain   . Carpal tunnel syndrome, bilateral   . Choledocholithiasis 3 2014 and 5 2014, Dec 2014   S/P ERCP and sphincterotomy x3  . Chronic back pain   . Gout   . Hypertension   . Leg cramps   . Osteoarthritis     Patient Active Problem List   Diagnosis Date Noted  . CAP (community acquired pneumonia) 06/13/2015  . Left knee pain 04/26/2015  . Right knee pain 04/26/2015  . Right upper quadrant pain   . Calculus of bile duct with acute cholangitis with obstruction   . Choledocholithiasis with cholecystitis   . Severe carpal tunnel syndrome 02/02/2014  . Hand weakness 02/02/2014  . Unspecified hereditary and idiopathic peripheral neuropathy 10/30/2013  . Choledocholithiasis 04/17/2013  . Abdominal pain 02/14/2013  . Acute pancreatitis 02/14/2013  . Elevated LFTs 02/14/2013  . Hepatic steatosis 02/14/2013  . Hyperbilirubinemia 02/14/2013  . Leukocytosis 02/14/2013  . Osteoarthritis 02/14/2013  . RUQ pain 07/07/2012  . VIRAL URI 02/17/2008  . PAIN IN JOINT OTHER SPECIFIED SITES 06/09/2007  . PLEURISY 09/03/2006  .  LIVER FUNCTION TESTS, ABNORMAL 09/03/2006  . HYPOKALEMIA 06/14/2006  . CONSTIPATION 05/16/2006  . NUMBNESS, HAND 05/16/2006  . Hypoxemia 04/17/2006  . HYPERLIPIDEMIA 01/04/2006  . GOUT 01/04/2006  . CARPAL TUNNEL SYNDROME 01/04/2006  . CATARACT NOS 01/04/2006  . Essential hypertension 01/04/2006  . BRONCHITIS NOS 01/04/2006  . ARTHRITIS 01/04/2006  . SPINAL STENOSIS 01/04/2006  . OSTEOPOROSIS 01/04/2006    Past Surgical History:  Procedure Laterality Date  . APPENDECTOMY    . BALLOON DILATION  05/23/2012   Procedure: AMPULLARY BALLOON DILATION;  Surgeon: Corbin Ade, MD;  Location: [facility];  Service: Endoscopy;;  . BALLOON DILATION N/A 07/24/2012   Procedure: BALLOON DILATION; Balloon basket dredge and balloon dialate sphincterotomy;  Surgeon: Corbin Ade, MD;  Location: [facility];  Service: Endoscopy;  Laterality: N/A;  . BALLOON DILATION N/A 02/17/2013   Procedure: BALLOON DILATION;  Surgeon: Corbin Ade, MD;  Location: [facility];  Service: Endoscopy;  Laterality: N/A;  . BILIARY STENT PLACEMENT  05/23/2012   Procedure: BILIARY STENT PLACEMENT;  Surgeon: Corbin Ade, MD;  Location: [facility];  Service: Endoscopy;;  . BREAST BIOPSY Left   . CARPAL TUNNEL RELEASE Right   . CATARACT EXTRACTION Bilateral   . CHOLECYSTECTOMY    . CHOLEDOCHOENTEROSTOMY N/A 11/09/2014   Procedure: CHOLEDOCHODUODENOSTOMY;  Surgeon: Franky Macho, MD;  Location: [facility];  Service: General;  Laterality: N/A;  .  ERCP  05/23/2012   RMR: juxta-ampullary duodenal diverticulum, markedly dilated biliary tree with choledocholithiasis, s/p sphincterotomy with biliary sphincterotomy balloon dilation, balloon and basket stone extraction, stent placement  . ERCP N/A 07/24/2012   YNW:[id number]:Juxta-ampullary duodenal diverticulum/s/p dilation sphincterotomy site s/p basket stone extraction s/p cholangioscopy with holmium lithotripsy of common duct stones  . ERCP N/A 02/17/2013   Dr. Jena Gaussourk: markedly dilated biliary tree with  choledocholithiasis, s/p sphincterotomy and extraction  . HOLMIUM LASER APPLICATION N/A 07/24/2012   Procedure: HOLMIUM LASER APPLICATION;  Surgeon: Corbin Adeobert M Rourk, MD;  Location: [facility];  Service: Endoscopy;  Laterality: N/A;  . REMOVAL OF STONES  05/23/2012   Procedure: BALLOON AND BASKET REMOVAL OF STONES;  Surgeon: Corbin Adeobert M Rourk, MD;  Location: [facility];  Service: Endoscopy;;  . REMOVAL OF STONES N/A 07/24/2012   Procedure: REMOVAL OF STONES;  Surgeon: Corbin Adeobert M Rourk, MD;  Location: [facility];  Service: Endoscopy;  Laterality: N/A;  . right femur     metal plate  . SPHINCTEROTOMY  05/23/2012   Procedure: SPHINCTEROTOMY;  Surgeon: Corbin Adeobert M Rourk, MD;  Location: [facility];  Service: Endoscopy;;  . Burman FreestoneSPYGLASS CHOLANGIOSCOPY N/A 07/24/2012   Procedure: ONGEXBMWSPYGLASS CHOLANGIOSCOPY;  Surgeon: Corbin Adeobert M Rourk, MD;  Location: [facility];  Service: Endoscopy;  Laterality: N/A;  [name]. SPYGLASS LITHOTRIPSY N/A 07/24/2012   Procedure: UXLKGMWNSPYGLASS LITHOTRIPSY;  Surgeon: Corbin Adeobert M Rourk, MD;  Location: [facility];  Service: Endoscopy;  Laterality: N/A;  . STONE EXTRACTION WITH BASKET N/A 02/17/2013   Procedure: STONE EXTRACTION WITH BASKET and BALLOON;  Surgeon: Corbin Adeobert M Rourk, MD;  Location: [facility];  Service: Endoscopy;  Laterality: N/A;  . WRIST ARTHROPLASTY Right     OB History    Gravida Para Term Preterm AB Living   1 1 1      0   SAB TAB Ectopic Multiple Live Births                   Home Medications    Prior to Admission medications   Medication Sig Start Date End Date Taking? Authorizing Provider  allopurinol (ZYLOPRIM) 100 MG tablet Take 100 mg by mouth daily.   Yes [provider]  felodipine (PLENDIL) 10 MG 24 hr tablet Take 10 mg by mouth daily.   Yes [provider]  furosemide (LASIX) 40 MG tablet Take 40 mg by mouth 2 (two) times daily as needed for fluid or edema.    Yes [provider]  gabapentin (NEURONTIN) 100 MG capsule Take 100 mg by mouth 3 (three) times daily.   Yes [provider]  ibuprofen (ADVIL,MOTRIN) 200 MG tablet Take 200 mg by mouth every 6 (six) hours as needed.   Yes [provider]  polyethylene glycol (MIRALAX / GLYCOLAX) packet Take 17 g by mouth daily as needed for moderate constipation.   Yes [provider]  potassium chloride SA (K-DUR,KLOR-CON) 20 MEQ tablet Take 40 mEq by mouth daily.    Yes [provider]  traMADol (ULTRAM) 50 MG tablet Take 1 tablet (50 mg total) by mouth every 6 (six) hours as needed. 09/12/16  Yes Bethann BerkshireZammit, Joseph, MD  cephALEXin (KEFLEX) 500 MG capsule Take 1 capsule (500 mg total) by mouth 3 (three) times daily. 10/11/16   Dione BoozeGlick, David, MD  levofloxacin (LEVAQUIN) 250 MG tablet Take 2 tablets (500 mg total) by mouth daily. 09/12/16   Bethann BerkshireZammit, Joseph, MD  levofloxacin (LEVAQUIN) 250 MG tablet Take 1 tablet (250 mg total) by mouth daily. 09/12/16  Bethann Berkshire, MD  ondansetron (ZOFRAN) 4 MG tablet Take 1 tablet (4 mg total) by mouth every 6 (six) hours as needed for nausea. 10/11/16   Dione Booze, MD  oxyCODONE-acetaminophen (PERCOCET) 5-325 MG tablet Take 1-2 tablets by mouth every 4 (four) hours as needed for moderate pain. 10/11/16   Dione Booze, MD  pantoprazole (PROTONIX) 40 MG tablet Take 1 tablet (40 mg total) by mouth daily before breakfast. 06/16/15   Avon Gully, MD    Family History Family History  Problem Relation Age of Onset  . CAD Mother   . Stroke Father   . Cancer Son        ?metastatic  . Colon cancer Neg Hx     Social History Social History   Tobacco Use  . Smoking status: Former Smoker    Packs/day: 0.25    Years: 15.00    Pack years: 3.75    Types: Cigarettes    Last attempt to quit: 07/17/1988    Years since quitting: 28.5  . Smokeless tobacco: Never Used  Substance Use Topics  . Alcohol use: No  . Drug use: No     Allergies   Patient has no known allergies.   Review of Systems Review of Systems  Genitourinary: Positive for flank pain.  All other  systems reviewed and are negative.    Physical Exam Updated Vital Signs BP 135/67   Pulse 72   Temp 98.8 F (37.1 C) (Oral)   Resp (!) 23   Ht 5' (1.524 m)   Wt 57.2 kg (126 lb)   SpO2 98%   BMI 24.61 kg/m   Physical Exam  Constitutional: She is oriented to person, place, and time. She appears well-developed and well-nourished. No distress.  HENT:  Head: Normocephalic and atraumatic.  Right Ear: Hearing normal.  Left Ear: Hearing normal.  Nose: Nose normal.  Mouth/Throat: Oropharynx is clear and moist and mucous membranes are normal.  Eyes: Conjunctivae and EOM are normal. Pupils are equal, round, and reactive to light.  Neck: Normal range of motion. Neck supple.  Cardiovascular: Regular rhythm, S1 normal and S2 normal. Exam reveals no gallop and no friction rub.  No murmur heard. Pulmonary/Chest: Effort normal and breath sounds normal. No respiratory distress. She exhibits no tenderness.  Abdominal: Soft. Normal appearance and bowel sounds are normal. There is no hepatosplenomegaly. There is no tenderness. There is CVA tenderness (right). There is no rebound, no guarding, no tenderness at McBurney's point and negative Murphy's sign. No hernia.  Musculoskeletal: Normal range of motion.  Neurological: She is alert and oriented to person, place, and time. She has normal strength. No cranial nerve deficit or sensory deficit. Coordination normal. GCS eye subscore is 4. GCS verbal subscore is 5. GCS motor subscore is 6.  Skin: Skin is warm, dry and intact. No rash noted. No cyanosis.  Psychiatric: She has a normal mood and affect. Her speech is normal and behavior is normal. Thought content normal.  Nursing note and vitals reviewed.    ED Treatments / Results  Labs (all labs ordered are listed, but only abnormal results are displayed) Labs Reviewed  URINALYSIS, ROUTINE W REFLEX MICROSCOPIC - Abnormal; Notable for the following components:      Result Value   APPearance HAZY  (*)    All other components within normal limits  CBC WITH DIFFERENTIAL/PLATELET - Abnormal; Notable for the following components:   WBC 15.5 (*)    RBC 5.48 (*)    Hemoglobin 15.4 (*)  HCT 46.2 (*)    RDW 15.6 (*)    Neutro Abs 10.8 (*)    All other components within normal limits  COMPREHENSIVE METABOLIC PANEL - Abnormal; Notable for the following components:   Glucose, Bld 105 (*)    Creatinine, Ser 1.21 (*)    ALT 12 (*)    Alkaline Phosphatase 144 (*)    GFR calc non Af Amer 37 (*)    GFR calc Af Amer 43 (*)    All other components within normal limits  LIPASE, BLOOD    EKG  EKG Interpretation None       Radiology No results found.  Procedures Procedures (including critical care time)  Medications Ordered in ED Medications  morphine 2 MG/ML injection 2 mg (2 mg Intravenous Given 02/05/17 0653)  ondansetron (ZOFRAN) injection 4 mg (4 mg Intravenous Given 02/05/17 0653)     Initial Impression / Assessment and Plan / ED Course  I have reviewed the triage vital signs and the nursing notes.  Pertinent labs & imaging results that were available during my care of the patient were reviewed by me and considered in my medical decision making (see chart for details).     Patient presents to the emergency department for evaluation of flank pain that awakened her from sleep 2 hours before arrival in the ER.  Patient reports similar pain in the past with urinary tract infection.  Her urinalysis today, however, is completely normal.  She does have some right CVA area tenderness, but this is likely more musculoskeletal soft tissue tenderness and CVA tenderness.  She does not have any abdominal tenderness at this time.  She is afebrile.  She does have a leukocytosis, but appears to have had chronic leukocytosis in the past, unclear significance at this time.  Current presentation is most likely musculoskeletal pain.  After her low-dose morphine her pain has resolved and she is  feeling much better.  Awaiting CT report, will sign out to oncoming ER physician. Anticipate discharge as long as there are no acute findings.  Final Clinical Impressions(s) / ED Diagnoses   Final diagnoses:  Flank pain    ED Discharge Orders    None       Gilda CreasePollina, Christopher J, MD 02/05/17 [phone]

## 2017-02-05 NOTE — ED Notes (Signed)
Pt transported to CT ?

## 2017-02-05 NOTE — ED Notes (Signed)
Pt also states she has not voided in over a day.

## 2017-02-05 NOTE — ED Notes (Signed)
EDP at bedside updating patient and family. 

## 2017-02-05 NOTE — ED Triage Notes (Signed)
Pt c/o R sided flank pain x 2hrs. Pt with hx of UTI's.

## 2017-02-05 NOTE — Discharge Instructions (Signed)
Return to ER with fever, vomiting, recurrent or worsening pain, or other new or worsening symptoms.  Recheck with your physician if not improving  Tramadol for pain

## 2017-02-07 DIAGNOSIS — Z9181 History of falling: Secondary | ICD-10-CM | POA: Diagnosis not present

## 2017-02-07 DIAGNOSIS — Z87891 Personal history of nicotine dependence: Secondary | ICD-10-CM | POA: Diagnosis not present

## 2017-02-07 DIAGNOSIS — M4800 Spinal stenosis, site unspecified: Secondary | ICD-10-CM | POA: Diagnosis not present

## 2017-02-07 DIAGNOSIS — M109 Gout, unspecified: Secondary | ICD-10-CM | POA: Diagnosis not present

## 2017-02-07 DIAGNOSIS — I129 Hypertensive chronic kidney disease with stage 1 through stage 4 chronic kidney disease, or unspecified chronic kidney disease: Secondary | ICD-10-CM | POA: Diagnosis not present

## 2017-02-07 DIAGNOSIS — M25511 Pain in right shoulder: Secondary | ICD-10-CM | POA: Diagnosis not present

## 2017-02-07 DIAGNOSIS — M1991 Primary osteoarthritis, unspecified site: Secondary | ICD-10-CM | POA: Diagnosis not present

## 2017-02-07 DIAGNOSIS — N182 Chronic kidney disease, stage 2 (mild): Secondary | ICD-10-CM | POA: Diagnosis not present

## 2017-02-08 DIAGNOSIS — N182 Chronic kidney disease, stage 2 (mild): Secondary | ICD-10-CM | POA: Diagnosis not present

## 2017-02-08 DIAGNOSIS — Z87891 Personal history of nicotine dependence: Secondary | ICD-10-CM | POA: Diagnosis not present

## 2017-02-08 DIAGNOSIS — M109 Gout, unspecified: Secondary | ICD-10-CM | POA: Diagnosis not present

## 2017-02-08 DIAGNOSIS — I129 Hypertensive chronic kidney disease with stage 1 through stage 4 chronic kidney disease, or unspecified chronic kidney disease: Secondary | ICD-10-CM | POA: Diagnosis not present

## 2017-02-08 DIAGNOSIS — M25511 Pain in right shoulder: Secondary | ICD-10-CM | POA: Diagnosis not present

## 2017-02-08 DIAGNOSIS — M4800 Spinal stenosis, site unspecified: Secondary | ICD-10-CM | POA: Diagnosis not present

## 2017-02-08 DIAGNOSIS — M1991 Primary osteoarthritis, unspecified site: Secondary | ICD-10-CM | POA: Diagnosis not present

## 2017-02-08 DIAGNOSIS — Z9181 History of falling: Secondary | ICD-10-CM | POA: Diagnosis not present

## 2017-02-13 ENCOUNTER — Ambulatory Visit: Payer: Medicare Other | Admitting: Orthopaedic Surgery

## 2017-03-06 ENCOUNTER — Ambulatory Visit: Payer: Medicare Other | Admitting: Orthopaedic Surgery

## 2017-03-06 ENCOUNTER — Encounter: Payer: Self-pay | Admitting: Orthopaedic Surgery

## 2017-03-06 DIAGNOSIS — G8929 Other chronic pain: Secondary | ICD-10-CM | POA: Diagnosis not present

## 2017-03-06 DIAGNOSIS — M25562 Pain in left knee: Secondary | ICD-10-CM

## 2017-03-06 DIAGNOSIS — M25561 Pain in right knee: Secondary | ICD-10-CM

## 2017-03-06 DIAGNOSIS — I10 Essential (primary) hypertension: Secondary | ICD-10-CM

## 2017-03-06 NOTE — Progress Notes (Signed)
CC: Both of my knees are hurting. I would like an injection in both knees.  The patient has had chronic pain and tenderness of both knees for some time.  Injections help.  There is no locking or giving way of the knee.  There is no new trauma. There is no redness or signs of infections.  The knees have a mild effusion and some crepitus.  There is no redness or signs of recent trauma.  Right knee ROM is 0-95 and left knee ROM is 0-100.  Impression:  Chronic pain of the both knees  Return:  1 month  PROCEDURE NOTE:  The patient requests injections of both knees, verbal consent was obtained.  The left and right knee were individually prepped appropriately after time out was performed.   Sterile technique was observed and injection of 1 cc of Depo-Medrol 40 mg with several cc's of plain xylocaine. Anesthesia was provided by ethyl chloride and a 20-gauge needle was used to inject each knee area. The injections were tolerated well.  A band aid dressing was applied.  The patient was advised to apply ice later today and tomorrow to the injection sight as needed.  Electronically Signed Darreld McleanWayne Markasia Carrol, MD 1/2/20199:49 AM

## 2017-04-03 ENCOUNTER — Ambulatory Visit: Payer: Medicare Other | Admitting: Orthopaedic Surgery

## 2017-04-03 ENCOUNTER — Encounter: Payer: Self-pay | Admitting: Orthopaedic Surgery

## 2017-04-03 DIAGNOSIS — M25562 Pain in left knee: Secondary | ICD-10-CM

## 2017-04-03 DIAGNOSIS — M25561 Pain in right knee: Secondary | ICD-10-CM | POA: Diagnosis not present

## 2017-04-03 DIAGNOSIS — G8929 Other chronic pain: Secondary | ICD-10-CM | POA: Diagnosis not present

## 2017-04-03 NOTE — Progress Notes (Signed)
CC: Both of my knees are hurting. I would like an injection in both knees.  The patient has had chronic pain and tenderness of both knees for some time.  Injections help.  There is no locking or giving way of the knee.  There is no new trauma. There is no redness or signs of infections.  The knees have a mild effusion and some crepitus.  There is no redness or signs of recent trauma.  Right knee ROM is 0-100 and left knee ROM is 0-95.  Impression:  Chronic pain of the both knees  Return:  1 month  PROCEDURE NOTE:  The patient requests injections of both knees, verbal consent was obtained.  The left and right knee were individually prepped appropriately after time out was performed.   Sterile technique was observed and injection of 1 cc of Depo-Medrol 40 mg with several cc's of plain xylocaine. Anesthesia was provided by ethyl chloride and a 20-gauge needle was used to inject each knee area. The injections were tolerated well.  A band aid dressing was applied.  The patient was advised to apply ice later today and tomorrow to the injection sight as needed.  Electronically Signed Darreld McleanWayne Bryen Hinderman, MD 1/30/20199:40 AM

## 2017-04-03 NOTE — Progress Notes (Signed)
Follow up.

## 2017-04-10 DIAGNOSIS — M4800 Spinal stenosis, site unspecified: Secondary | ICD-10-CM | POA: Diagnosis not present

## 2017-04-10 DIAGNOSIS — I10 Essential (primary) hypertension: Secondary | ICD-10-CM | POA: Diagnosis not present

## 2017-04-10 DIAGNOSIS — N182 Chronic kidney disease, stage 2 (mild): Secondary | ICD-10-CM | POA: Diagnosis not present

## 2017-04-10 DIAGNOSIS — M159 Polyosteoarthritis, unspecified: Secondary | ICD-10-CM | POA: Diagnosis not present

## 2017-05-01 ENCOUNTER — Ambulatory Visit: Payer: Medicare Other | Admitting: Orthopaedic Surgery

## 2017-05-01 ENCOUNTER — Encounter: Payer: Self-pay | Admitting: Orthopaedic Surgery

## 2017-05-01 DIAGNOSIS — M25561 Pain in right knee: Secondary | ICD-10-CM

## 2017-05-01 DIAGNOSIS — G8929 Other chronic pain: Secondary | ICD-10-CM | POA: Diagnosis not present

## 2017-05-01 DIAGNOSIS — M25562 Pain in left knee: Secondary | ICD-10-CM | POA: Diagnosis not present

## 2017-05-01 NOTE — Progress Notes (Signed)
CC: Both of my knees are hurting. I would like an injection in both knees.  The patient has had chronic pain and tenderness of both knees for some time.  Injections help.  There is no locking or giving way of the knee.  There is no new trauma. There is no redness or signs of infections.  The knees have a mild effusion and some crepitus.  There is no redness or signs of recent trauma.  Right knee ROM is 0-100 and left knee ROM is 0-95.  Impression:  Chronic pain of the both knees  Return:  1 month  PROCEDURE NOTE:  The patient requests injections of both knees, verbal consent was obtained.  The left and right knee were individually prepped appropriately after time out was performed.   Sterile technique was observed and injection of 1 cc of Depo-Medrol 40 mg with several cc's of plain xylocaine. Anesthesia was provided by ethyl chloride and a 20-gauge needle was used to inject each knee area. The injections were tolerated well.  A band aid dressing was applied.  The patient was advised to apply ice later today and tomorrow to the injection sight as needed.   Electronically Signed Darreld McleanWayne Towana Stenglein, MD 2/27/20198:56 AM

## 2017-05-29 ENCOUNTER — Encounter: Payer: Self-pay | Admitting: Orthopedic Surgery

## 2017-05-29 ENCOUNTER — Ambulatory Visit: Payer: Medicare Other | Admitting: Orthopedic Surgery

## 2017-05-29 VITALS — BP 98/71 | HR 69 | Ht 61.0 in | Wt 127.0 lb

## 2017-05-29 DIAGNOSIS — M25561 Pain in right knee: Secondary | ICD-10-CM

## 2017-05-29 DIAGNOSIS — M25562 Pain in left knee: Secondary | ICD-10-CM

## 2017-05-29 DIAGNOSIS — G8929 Other chronic pain: Secondary | ICD-10-CM

## 2017-05-29 NOTE — Progress Notes (Signed)
Chief Complaint  Patient presents with  . Knee Pain    bilateral knees painful   . Injections     Procedure note left knee injection verbal consent was obtained to inject left knee joint  Timeout was completed to confirm the site of injection  The medications used were 40 mg of Depo-Medrol and 1% lidocaine 3 cc  Anesthesia was provided by ethyl chloride and the skin was prepped with alcohol.  After cleaning the skin with alcohol a 20-gauge needle was used to inject the left knee joint. There were no complications. A sterile bandage was applied.   Procedure note right knee injection verbal consent was obtained to inject right knee joint  Timeout was completed to confirm the site of injection  The medications used were 40 mg of Depo-Medrol and 1% lidocaine 3 cc  Anesthesia was provided by ethyl chloride and the skin was prepped with alcohol.  After cleaning the skin with alcohol a 20-gauge needle was used to inject the right knee joint. There were no complications. A sterile bandage was applied.   Encounter Diagnoses  Name Primary?  . Chronic pain of right knee Yes  . Chronic pain of left knee

## 2017-07-05 DIAGNOSIS — E559 Vitamin D deficiency, unspecified: Secondary | ICD-10-CM | POA: Diagnosis not present

## 2017-07-05 DIAGNOSIS — I5033 Acute on chronic diastolic (congestive) heart failure: Secondary | ICD-10-CM | POA: Diagnosis not present

## 2017-07-05 DIAGNOSIS — N182 Chronic kidney disease, stage 2 (mild): Secondary | ICD-10-CM | POA: Diagnosis not present

## 2017-07-05 DIAGNOSIS — M159 Polyosteoarthritis, unspecified: Secondary | ICD-10-CM | POA: Diagnosis not present

## 2017-07-05 DIAGNOSIS — I10 Essential (primary) hypertension: Secondary | ICD-10-CM | POA: Diagnosis not present

## 2017-07-05 DIAGNOSIS — E538 Deficiency of other specified B group vitamins: Secondary | ICD-10-CM | POA: Diagnosis not present

## 2017-07-08 ENCOUNTER — Other Ambulatory Visit (HOSPITAL_COMMUNITY): Payer: Self-pay | Admitting: Internal Medicine

## 2017-07-08 DIAGNOSIS — I509 Heart failure, unspecified: Secondary | ICD-10-CM

## 2017-07-10 ENCOUNTER — Encounter: Payer: Self-pay | Admitting: Orthopedic Surgery

## 2017-07-10 ENCOUNTER — Ambulatory Visit: Payer: Medicare Other | Admitting: Orthopedic Surgery

## 2017-07-10 VITALS — BP 105/61 | HR 81 | Ht 61.0 in | Wt 127.0 lb

## 2017-07-10 DIAGNOSIS — M25561 Pain in right knee: Secondary | ICD-10-CM | POA: Diagnosis not present

## 2017-07-10 DIAGNOSIS — M25562 Pain in left knee: Secondary | ICD-10-CM

## 2017-07-10 DIAGNOSIS — G8929 Other chronic pain: Secondary | ICD-10-CM | POA: Diagnosis not present

## 2017-07-10 NOTE — Progress Notes (Signed)
Chief Complaint  Patient presents with  . Knee Pain    bilateral   . Injections    wants injections bilateral knees     Procedure note left knee injection verbal consent was obtained to inject left knee joint  Timeout was completed to confirm the site of injection  The medications used were 40 mg of Depo-Medrol and 1% lidocaine 3 cc  Anesthesia was provided by ethyl chloride and the skin was prepped with alcohol.  After cleaning the skin with alcohol a 20-gauge needle was used to inject the left knee joint. There were no complications. A sterile bandage was applied.   Procedure note right knee injection verbal consent was obtained to inject right knee joint  Timeout was completed to confirm the site of injection  The medications used were 40 mg of Depo-Medrol and 1% lidocaine 3 cc  Anesthesia was provided by ethyl chloride and the skin was prepped with alcohol.  After cleaning the skin with alcohol a 20-gauge needle was used to inject the right knee joint. There were no complications. A sterile bandage was applied.  Encounter Diagnoses  Name Primary?  . Chronic pain of right knee Yes  . Chronic pain of left knee    1 MONTH DR Hilda Lias

## 2017-07-10 NOTE — Patient Instructions (Signed)
FU 1 MONTH DR Hilda Lias KNEE INJ

## 2017-07-12 ENCOUNTER — Ambulatory Visit (HOSPITAL_COMMUNITY)
Admission: RE | Admit: 2017-07-12 | Discharge: 2017-07-12 | Disposition: A | Discharge status: Home / Self Care | Payer: Medicare Other | Source: Ambulatory Visit | Attending: Internal Medicine | Admitting: Internal Medicine

## 2017-07-12 DIAGNOSIS — I509 Heart failure, unspecified: Secondary | ICD-10-CM | POA: Insufficient documentation

## 2017-07-12 DIAGNOSIS — I110 Hypertensive heart disease with heart failure: Secondary | ICD-10-CM | POA: Insufficient documentation

## 2017-07-12 DIAGNOSIS — I082 Rheumatic disorders of both aortic and tricuspid valves: Secondary | ICD-10-CM | POA: Insufficient documentation

## 2017-07-12 DIAGNOSIS — E785 Hyperlipidemia, unspecified: Secondary | ICD-10-CM | POA: Diagnosis not present

## 2017-07-12 NOTE — Progress Notes (Signed)
*  PRELIMINARY RESULTS* Echocardiogram 2D Echocardiogram has been performed.  Stacey Drain 07/12/2017, 12:36 PM

## 2017-07-16 ENCOUNTER — Encounter: Payer: Self-pay | Admitting: Obstetrics & Gynecology

## 2017-07-16 ENCOUNTER — Other Ambulatory Visit: Payer: Self-pay

## 2017-07-16 ENCOUNTER — Ambulatory Visit: Payer: Medicare Other | Admitting: Obstetrics & Gynecology

## 2017-07-16 VITALS — BP 132/84 | HR 77 | Wt 122.0 lb

## 2017-07-16 DIAGNOSIS — K5909 Other constipation: Secondary | ICD-10-CM

## 2017-07-16 DIAGNOSIS — N812 Incomplete uterovaginal prolapse: Secondary | ICD-10-CM | POA: Diagnosis not present

## 2017-07-16 DIAGNOSIS — M545 Low back pain: Secondary | ICD-10-CM | POA: Diagnosis not present

## 2017-07-16 MED ORDER — RING PESSARY/SUPPORT MISC: 0 refills | Status: AC

## 2017-07-16 NOTE — Progress Notes (Signed)
Patient ID: Lori Bishop, female   DOB: 05-13-1920, 84 y.o.   MRN: [id number]      Chief Complaint  Patient presents with  . Referral    uterine prolapse      84 y.o. G1P1000 No LMP recorded. Patient is postmenopausal. The current method of family planning is post menopausal status.  Outpatient Encounter Medications as of 07/16/2017  Medication Sig  . allopurinol (ZYLOPRIM) 100 MG tablet Take 100 mg by mouth daily.  . cholecalciferol (VITAMIN D) 1000 units tablet Take 1,000 Units by mouth daily.  . felodipine (PLENDIL) 10 MG 24 hr tablet Take 10 mg by mouth daily.  . furosemide (LASIX) 40 MG tablet Take 40 mg by mouth 2 (two) times daily as needed for fluid or edema.   . gabapentin (NEURONTIN) 100 MG capsule Take 100 mg by mouth 3 (three) times daily.  Marland Kitchen ibuprofen (ADVIL,MOTRIN) 200 MG tablet Take 200 mg by mouth every 6 (six) hours as needed.  Marland Kitchen lisinopril (PRINIVIL,ZESTRIL) 10 MG tablet   . pantoprazole (PROTONIX) 40 MG tablet Take 1 tablet (40 mg total) by mouth daily before breakfast.  . polyethylene glycol (MIRALAX / GLYCOLAX) packet Take 17 g by mouth daily as needed for moderate constipation.  Marland Kitchen spironolactone (ALDACTONE) 25 MG tablet   . traMADol (ULTRAM) 50 MG tablet Take 1 tablet (50 mg total) by mouth every 6 (six) hours as needed.  . Misc. Devices (RING PESSARY/SUPPORT) MISC Place in vagina and keep in place Milex ring with support #4   No facility-administered encounter medications on file as of 07/16/2017.     Subjective Lori Bishop presents stating she is experinecing pelvic prolapse She has pelvic pressure and low back pain +constipation for several years occasional incontinence  Past Medical History:  Diagnosis Date  . Bilateral chronic knee pain   . Carpal tunnel syndrome, bilateral   . Choledocholithiasis 3 2014 and 5 2014, Dec 2014   S/P ERCP and sphincterotomy x3  . Chronic back pain   . Chronic kidney disease   . GERD (gastroesophageal reflux  disease)   . Gout   . Hypertension   . Leg cramps   . Osteoarthritis   . Spinal stenosis     Past Surgical History:  Procedure Laterality Date  . APPENDECTOMY    . BALLOON DILATION  05/23/2012   Procedure: AMPULLARY BALLOON DILATION;  Surgeon: Corbin Ade, MD;  Location: [facility];  Service: Endoscopy;;  . BALLOON DILATION N/A 07/24/2012   Procedure: BALLOON DILATION; Balloon basket dredge and balloon dialate sphincterotomy;  Surgeon: Corbin Ade, MD;  Location: [facility];  Service: Endoscopy;  Laterality: N/A;  . BALLOON DILATION N/A 02/17/2013   Procedure: BALLOON DILATION;  Surgeon: Corbin Ade, MD;  Location: [facility];  Service: Endoscopy;  Laterality: N/A;  . BILIARY STENT PLACEMENT  05/23/2012   Procedure: BILIARY STENT PLACEMENT;  Surgeon: Corbin Ade, MD;  Location: [facility];  Service: Endoscopy;;  . BREAST BIOPSY Left   . CARPAL TUNNEL RELEASE Right   . CATARACT EXTRACTION Bilateral   . CHOLECYSTECTOMY    . CHOLEDOCHOENTEROSTOMY N/A 11/09/2014   Procedure: CHOLEDOCHODUODENOSTOMY;  Surgeon: Franky Macho, MD;  Location: [facility];  Service: General;  Laterality: N/A;  . ERCP  05/23/2012   RMR: juxta-ampullary duodenal diverticulum, markedly dilated biliary tree with choledocholithiasis, s/p sphincterotomy with biliary sphincterotomy balloon dilation, balloon and basket stone extraction, stent placement  . ERCP N/A 07/24/2012   [id number] duodenal diverticulum/s/p dilation sphincterotomy site  s/p basket stone extraction s/p cholangioscopy with holmium lithotripsy of common duct stones  . ERCP N/A 02/17/2013   Dr. Jena Gauss: markedly dilated biliary tree with choledocholithiasis, s/p sphincterotomy and extraction  . HOLMIUM LASER APPLICATION N/A 07/24/2012   Procedure: HOLMIUM LASER APPLICATION;  Surgeon: Corbin Ade, MD;  Location: [facility];  Service: Endoscopy;  Laterality: N/A;  . REMOVAL OF STONES  05/23/2012   Procedure: BALLOON AND BASKET REMOVAL OF STONES;  Surgeon: Corbin Ade, MD;  Location: [facility];  Service: Endoscopy;;  . REMOVAL OF STONES N/A 07/24/2012   Procedure: REMOVAL OF STONES;  Surgeon: Corbin Ade, MD;  Location: [facility];  Service: Endoscopy;  Laterality: N/A;  . right femur     metal plate  . SPHINCTEROTOMY  05/23/2012   Procedure: SPHINCTEROTOMY;  Surgeon: Corbin Ade, MD;  Location: [facility];  Service: Endoscopy;;  . [name] CHOLANGIOSCOPY N/A 07/24/2012   Procedure: ZOXWRUEA CHOLANGIOSCOPY;  Surgeon: Corbin Ade, MD;  Location: [facility];  Service: Endoscopy;  Laterality: N/A;  [name] LITHOTRIPSY N/A 07/24/2012   Procedure: [id number] LITHOTRIPSY;  Surgeon: Corbin Ade, MD;  Location: [facility];  Service: Endoscopy;  Laterality: N/A;  . STONE EXTRACTION WITH BASKET N/A 02/17/2013   Procedure: STONE EXTRACTION WITH BASKET and BALLOON;  Surgeon: Corbin Ade, MD;  Location: [facility];  Service: Endoscopy;  Laterality: N/A;  . WRIST ARTHROPLASTY Right     OB History    Gravida  1   Para  1   Term  1   Preterm      AB      Living  0     SAB      TAB      Ectopic      Multiple      Live Births              No Known Allergies  Social History   Socioeconomic History  . Marital status: Widowed    Spouse name: Not on file  . Number of children: 1  . Years of education: College  . Highest education level: Not on file  Occupational History  . Occupation: retired: Charity fundraiser [employer]    Employer: RETIRED  Social Needs  . Financial resource strain: Not on file  . Food insecurity:    Worry: Not on file    Inability: Not on file  . Transportation needs:    Medical: Not on file    Non-medical: Not on file  Tobacco Use  . Smoking status: Former Smoker    Packs/day: 0.25    Years: 15.00    Pack years: 3.75    Types: Cigarettes    Last attempt to quit: 07/17/1988    Years since quitting: 29.0  . Smokeless tobacco: Never Used  Substance and Sexual Activity  . Alcohol use: No  . Drug use: No  . Sexual activity: Not  Currently    Birth control/protection: Post-menopausal  Lifestyle  . Physical activity:    Days per week: Not on file    Minutes per session: Not on file  . Stress: Not on file  Relationships  . Social connections:    Talks on phone: Not on file    Gets together: Not on file    Attends religious service: Not on file    Active member of club or organization: Not on file    Attends meetings of clubs or organizations: Not on file    Relationship status:  Not on file  Other Topics Concern  . Not on file  Social History Narrative   Patient is single with one child.   Patient is right handed.   Patient is a Charity fundraiser    Patient drinks 1 cup daily.    Candiss Norse, niece & caregiver, as well as POA          Family History  Problem Relation Age of Onset  . CAD Mother   . Stroke Father   . Cancer Son   . Colon cancer Neg Hx     Medications:       Current Outpatient Medications:  .  allopurinol (ZYLOPRIM) 100 MG tablet, Take 100 mg by mouth daily., Disp: , Rfl:  .  cholecalciferol (VITAMIN D) 1000 units tablet, Take 1,000 Units by mouth daily., Disp: , Rfl:  .  felodipine (PLENDIL) 10 MG 24 hr tablet, Take 10 mg by mouth daily., Disp: , Rfl:  .  furosemide (LASIX) 40 MG tablet, Take 40 mg by mouth 2 (two) times daily as needed for fluid or edema. , Disp: , Rfl:  .  gabapentin (NEURONTIN) 100 MG capsule, Take 100 mg by mouth 3 (three) times daily., Disp: , Rfl:  .  ibuprofen (ADVIL,MOTRIN) 200 MG tablet, Take 200 mg by mouth every 6 (six) hours as needed., Disp: , Rfl:  .  lisinopril (PRINIVIL,ZESTRIL) 10 MG tablet, , Disp: , Rfl:  .  pantoprazole (PROTONIX) 40 MG tablet, Take 1 tablet (40 mg total) by mouth daily before breakfast., Disp: 30 tablet, Rfl: 3 .  polyethylene glycol (MIRALAX / GLYCOLAX) packet, Take 17 g by mouth daily as needed for moderate constipation., Disp: , Rfl:  .  spironolactone (ALDACTONE) 25 MG tablet, , Disp: , Rfl:  .  traMADol (ULTRAM) 50 MG tablet, Take 1  tablet (50 mg total) by mouth every 6 (six) hours as needed., Disp: 10 tablet, Rfl: 0 .  Misc. Devices (RING PESSARY/SUPPORT) MISC, Place in vagina and keep in place Milex ring with support #4, Disp: 1 each, Rfl: 0  Objective Blood pressure 132/84, pulse 77, weight 122 lb (55.3 kg).  General [facility] female NAD Vulva:  normal appearing vulva with no masses, tenderness or lesions Vagina:  rectocoele Grade III, cystocoele Grade III uterine prolapse Cervix:  no cervical motion tenderness and no lesions Uterus:  normal size, contour, position, consistency, mobility, non-tender Adnexa: ovaries:,     Pertinent ROS No burning with urination, frequency or urgency No nausea, vomiting or diarrhea Nor fever chills or other constitutional symptoms   Labs or studies     Impression Diagnoses this Encounter::   ICD-10-CM   1. Incomplete uterovaginal prolapse N81.2    Grade III fitfor Milex ring with support #4    Established relevant diagnosis(es):   Plan/Recommendations: Meds ordered this encounter  Medications  . Misc. Devices (RING PESSARY/SUPPORT) MISC    Sig: Place in vagina and keep in place Milex ring with support #4    Dispense:  1 each    Refill:  0    Labs or Scans Ordered: No orders of the defined types were placed in this encounter.   Management:: Fit today for pessary, Milex ring with support #4  Follow up Return in about 1 week (around 07/23/2017) for Follow up, with Dr Despina Hidden.      All questions were answered.

## 2017-07-17 DIAGNOSIS — R609 Edema, unspecified: Secondary | ICD-10-CM | POA: Diagnosis not present

## 2017-07-17 DIAGNOSIS — I10 Essential (primary) hypertension: Secondary | ICD-10-CM | POA: Diagnosis not present

## 2017-07-17 DIAGNOSIS — N182 Chronic kidney disease, stage 2 (mild): Secondary | ICD-10-CM | POA: Diagnosis not present

## 2017-07-17 DIAGNOSIS — E559 Vitamin D deficiency, unspecified: Secondary | ICD-10-CM | POA: Diagnosis not present

## 2017-07-23 ENCOUNTER — Encounter: Payer: Self-pay | Admitting: Obstetrics & Gynecology

## 2017-07-23 ENCOUNTER — Ambulatory Visit: Payer: Medicare Other | Admitting: Obstetrics & Gynecology

## 2017-07-23 VITALS — BP 120/68 | HR 75 | Wt 122.0 lb

## 2017-07-23 DIAGNOSIS — N812 Incomplete uterovaginal prolapse: Secondary | ICD-10-CM

## 2017-07-23 NOTE — Progress Notes (Signed)
Chief Complaint  Patient presents with  . pessary insertion    Blood pressure 120/68, pulse 75, weight 122 lb (55.3 kg).  Lori Bishop presents today for routine follow up related to her pessary.   She was fit for a Milex ring with support #4 at her last visit and it was placed in the vagina this am She reports no vaginal discharge or vaginal bleeding.  Exam reveals no undue vaginal mucosal pressure of breakdown, no discharge and no vaginal bleeding.  The pessary is placed in the vagina with minimal discomfort and the good fit is confirmed   Lori Bishop will be sen back in 1 months for initial follow up.  [name], MD  07/23/2017 5:04 PM

## 2017-07-25 ENCOUNTER — Ambulatory Visit: Payer: Medicare Other | Admitting: Obstetrics & Gynecology

## 2017-07-29 DIAGNOSIS — S098XXA Other specified injuries of head, initial encounter: Secondary | ICD-10-CM | POA: Diagnosis not present

## 2017-07-29 DIAGNOSIS — S064X0A Epidural hemorrhage without loss of consciousness, initial encounter: Secondary | ICD-10-CM | POA: Diagnosis not present

## 2017-07-30 ENCOUNTER — Emergency Department (HOSPITAL_COMMUNITY): Payer: Medicare Other

## 2017-07-30 ENCOUNTER — Observation Stay (HOSPITAL_COMMUNITY)
Admission: EM | Admit: 2017-07-30 | Discharge: 2017-08-01 | Disposition: A | Discharge status: Home / Self Care | Payer: Medicare Other | Attending: Internal Medicine | Admitting: Internal Medicine

## 2017-07-30 ENCOUNTER — Other Ambulatory Visit: Payer: Self-pay

## 2017-07-30 ENCOUNTER — Encounter (HOSPITAL_COMMUNITY): Payer: Self-pay | Admitting: *Deleted

## 2017-07-30 ENCOUNTER — Observation Stay (HOSPITAL_COMMUNITY): Payer: Medicare Other

## 2017-07-30 DIAGNOSIS — Z79899 Other long term (current) drug therapy: Secondary | ICD-10-CM | POA: Diagnosis not present

## 2017-07-30 DIAGNOSIS — S8012XA Contusion of left lower leg, initial encounter: Secondary | ICD-10-CM | POA: Diagnosis not present

## 2017-07-30 DIAGNOSIS — R531 Weakness: Secondary | ICD-10-CM | POA: Diagnosis not present

## 2017-07-30 DIAGNOSIS — Z87891 Personal history of nicotine dependence: Secondary | ICD-10-CM | POA: Diagnosis not present

## 2017-07-30 DIAGNOSIS — M129 Arthropathy, unspecified: Secondary | ICD-10-CM | POA: Diagnosis present

## 2017-07-30 DIAGNOSIS — E876 Hypokalemia: Secondary | ICD-10-CM | POA: Diagnosis not present

## 2017-07-30 DIAGNOSIS — Y92122 Bedroom in nursing home as the place of occurrence of the external cause: Secondary | ICD-10-CM | POA: Insufficient documentation

## 2017-07-30 DIAGNOSIS — Y999 Unspecified external cause status: Secondary | ICD-10-CM | POA: Insufficient documentation

## 2017-07-30 DIAGNOSIS — I129 Hypertensive chronic kidney disease with stage 1 through stage 4 chronic kidney disease, or unspecified chronic kidney disease: Secondary | ICD-10-CM | POA: Diagnosis not present

## 2017-07-30 DIAGNOSIS — W06XXXA Fall from bed, initial encounter: Secondary | ICD-10-CM | POA: Insufficient documentation

## 2017-07-30 DIAGNOSIS — N189 Chronic kidney disease, unspecified: Secondary | ICD-10-CM | POA: Insufficient documentation

## 2017-07-30 DIAGNOSIS — I5032 Chronic diastolic (congestive) heart failure: Secondary | ICD-10-CM

## 2017-07-30 DIAGNOSIS — S8991XA Unspecified injury of right lower leg, initial encounter: Secondary | ICD-10-CM | POA: Diagnosis not present

## 2017-07-30 DIAGNOSIS — K219 Gastro-esophageal reflux disease without esophagitis: Secondary | ICD-10-CM

## 2017-07-30 DIAGNOSIS — W19XXXA Unspecified fall, initial encounter: Secondary | ICD-10-CM | POA: Diagnosis not present

## 2017-07-30 DIAGNOSIS — S8992XA Unspecified injury of left lower leg, initial encounter: Secondary | ICD-10-CM | POA: Diagnosis not present

## 2017-07-30 DIAGNOSIS — I10 Essential (primary) hypertension: Secondary | ICD-10-CM | POA: Diagnosis present

## 2017-07-30 DIAGNOSIS — D72829 Elevated white blood cell count, unspecified: Secondary | ICD-10-CM

## 2017-07-30 DIAGNOSIS — M109 Gout, unspecified: Secondary | ICD-10-CM | POA: Diagnosis present

## 2017-07-30 DIAGNOSIS — W1789XA Other fall from one level to another, initial encounter: Secondary | ICD-10-CM | POA: Insufficient documentation

## 2017-07-30 DIAGNOSIS — S0083XA Contusion of other part of head, initial encounter: Secondary | ICD-10-CM

## 2017-07-30 DIAGNOSIS — Y9389 Activity, other specified: Secondary | ICD-10-CM | POA: Insufficient documentation

## 2017-07-30 DIAGNOSIS — S79911A Unspecified injury of right hip, initial encounter: Secondary | ICD-10-CM | POA: Diagnosis not present

## 2017-07-30 DIAGNOSIS — E875 Hyperkalemia: Secondary | ICD-10-CM

## 2017-07-30 DIAGNOSIS — S0003XA Contusion of scalp, initial encounter: Secondary | ICD-10-CM | POA: Diagnosis not present

## 2017-07-30 DIAGNOSIS — S3992XA Unspecified injury of lower back, initial encounter: Secondary | ICD-10-CM | POA: Diagnosis not present

## 2017-07-30 LAB — CBC WITH DIFFERENTIAL/PLATELET
BASOS ABS: 0 10*3/uL (ref 0.0–0.1)
BASOS PCT: 0 %
EOS ABS: 0.1 10*3/uL (ref 0.0–0.7)
Eosinophils Relative: 1 %
HCT: 41.2 % (ref 36.0–46.0)
Hemoglobin: 13.6 g/dL (ref 12.0–15.0)
Lymphocytes Relative: 13 %
Lymphs Abs: 2.4 10*3/uL (ref 0.7–4.0)
MCH: 28.2 pg (ref 26.0–34.0)
MCHC: 33 g/dL (ref 30.0–36.0)
MCV: 85.5 fL (ref 78.0–100.0)
MONO ABS: 1.7 10*3/uL — AB (ref 0.1–1.0)
MONOS PCT: 9 %
Neutro Abs: 14.2 10*3/uL — ABNORMAL HIGH (ref 1.7–7.7)
Neutrophils Relative %: 77 %
PLATELETS: 264 10*3/uL (ref 150–400)
RBC: 4.82 MIL/uL (ref 3.87–5.11)
RDW: 14.7 % (ref 11.5–15.5)
WBC: 18.5 10*3/uL — ABNORMAL HIGH (ref 4.0–10.5)

## 2017-07-30 LAB — MAGNESIUM: Magnesium: 2 mg/dL (ref 1.7–2.4)

## 2017-07-30 LAB — URINALYSIS, ROUTINE W REFLEX MICROSCOPIC
Bilirubin Urine: NEGATIVE
GLUCOSE, UA: NEGATIVE mg/dL
HGB URINE DIPSTICK: NEGATIVE
Ketones, ur: NEGATIVE mg/dL
NITRITE: NEGATIVE
PH: 5 (ref 5.0–8.0)
Protein, ur: NEGATIVE mg/dL
SPECIFIC GRAVITY, URINE: 1.009 (ref 1.005–1.030)

## 2017-07-30 LAB — COMPREHENSIVE METABOLIC PANEL
ALBUMIN: 3.6 g/dL (ref 3.5–5.0)
ALK PHOS: 95 U/L (ref 38–126)
ALT: 14 U/L (ref 14–54)
AST: 27 U/L (ref 15–41)
Anion gap: 12 (ref 5–15)
BILIRUBIN TOTAL: 1.2 mg/dL (ref 0.3–1.2)
BUN: 23 mg/dL — ABNORMAL HIGH (ref 6–20)
CALCIUM: 9.2 mg/dL (ref 8.9–10.3)
CO2: 27 mmol/L (ref 22–32)
CREATININE: 1.38 mg/dL — AB (ref 0.44–1.00)
Chloride: 102 mmol/L (ref 101–111)
GFR calc non Af Amer: 32 mL/min — ABNORMAL LOW (ref >60)
GFR, EST AFRICAN AMERICAN: 37 mL/min — AB (ref >60)
GLUCOSE: 99 mg/dL (ref 65–99)
Potassium: 2.6 mmol/L — CL (ref 3.5–5.1)
Sodium: 141 mmol/L (ref 135–145)
TOTAL PROTEIN: 6.8 g/dL (ref 6.5–8.1)

## 2017-07-30 LAB — CK: CK TOTAL: 286 U/L — AB (ref 38–234)

## 2017-07-30 MED ORDER — FELODIPINE ER 5 MG PO TB24
10.0000 mg | ORAL_TABLET | Freq: Every day | ORAL | Status: DC
  Administered 2017-07-30: 10 mg via ORAL
  Filled 2017-07-30 (×2): qty 1

## 2017-07-30 MED ORDER — GABAPENTIN 100 MG PO CAPS
100.0000 mg | ORAL_CAPSULE | Freq: Three times a day (TID) | ORAL | Status: DC
  Administered 2017-07-30 – 2017-08-01 (×7): 100 mg via ORAL
  Filled 2017-07-30 (×7): qty 1

## 2017-07-30 MED ORDER — TRAMADOL HCL 50 MG PO TABS
50.0000 mg | ORAL_TABLET | Freq: Four times a day (QID) | ORAL | Status: DC | PRN
  Administered 2017-07-30 – 2017-08-01 (×8): 50 mg via ORAL
  Filled 2017-07-30 (×8): qty 1

## 2017-07-30 MED ORDER — ALLOPURINOL 100 MG PO TABS
100.0000 mg | ORAL_TABLET | Freq: Every day | ORAL | Status: DC
  Administered 2017-07-30 – 2017-08-01 (×3): 100 mg via ORAL
  Filled 2017-07-30 (×3): qty 1

## 2017-07-30 MED ORDER — TRAMADOL HCL 50 MG PO TABS
ORAL_TABLET | ORAL | Status: AC
  Administered 2017-07-30: 50 mg via ORAL
  Filled 2017-07-30: qty 1

## 2017-07-30 MED ORDER — ACETAMINOPHEN 325 MG PO TABS: 650.0000 mg | ORAL_TABLET | Freq: Four times a day (QID) | ORAL | Status: DC | PRN

## 2017-07-30 MED ORDER — SODIUM CHLORIDE 0.9 % IV SOLN
INTRAVENOUS | Status: DC
  Administered 2017-07-30: 05:00:00 via INTRAVENOUS

## 2017-07-30 MED ORDER — POTASSIUM CHLORIDE IN NACL 40-0.9 MEQ/L-% IV SOLN
INTRAVENOUS | Status: AC
  Administered 2017-07-30: 75 mL/h via INTRAVENOUS

## 2017-07-30 MED ORDER — SODIUM CHLORIDE 0.9 % IV SOLN: INTRAVENOUS | Status: DC

## 2017-07-30 MED ORDER — POTASSIUM CHLORIDE CRYS ER 20 MEQ PO TBCR
40.0000 meq | EXTENDED_RELEASE_TABLET | Freq: Two times a day (BID) | ORAL | Status: DC
  Administered 2017-07-30 (×2): 40 meq via ORAL
  Filled 2017-07-30 (×2): qty 2

## 2017-07-30 MED ORDER — POLYETHYLENE GLYCOL 3350 17 G PO PACK: 17.0000 g | PACK | Freq: Every day | ORAL | Status: DC | PRN

## 2017-07-30 MED ORDER — LISINOPRIL 10 MG PO TABS
10.0000 mg | ORAL_TABLET | Freq: Every day | ORAL | Status: DC
  Administered 2017-07-30: 10 mg via ORAL
  Filled 2017-07-30: qty 1

## 2017-07-30 MED ORDER — ONDANSETRON HCL 4 MG PO TABS: 4.0000 mg | ORAL_TABLET | Freq: Four times a day (QID) | ORAL | Status: DC | PRN

## 2017-07-30 MED ORDER — POTASSIUM CHLORIDE IN NACL 40-0.9 MEQ/L-% IV SOLN
INTRAVENOUS | Status: DC
  Administered 2017-07-30: 75 mL/h via INTRAVENOUS

## 2017-07-30 MED ORDER — PANTOPRAZOLE SODIUM 40 MG PO TBEC
40.0000 mg | DELAYED_RELEASE_TABLET | Freq: Every day | ORAL | Status: DC
  Administered 2017-07-30 – 2017-08-01 (×3): 40 mg via ORAL
  Filled 2017-07-30 (×3): qty 1

## 2017-07-30 MED ORDER — ONDANSETRON HCL 4 MG/2ML IJ SOLN: 4.0000 mg | Freq: Four times a day (QID) | INTRAMUSCULAR | Status: DC | PRN

## 2017-07-30 MED ORDER — ACETAMINOPHEN 650 MG RE SUPP: 650.0000 mg | Freq: Four times a day (QID) | RECTAL | Status: DC | PRN

## 2017-07-30 MED ORDER — ACETAMINOPHEN 500 MG PO TABS
500.0000 mg | ORAL_TABLET | Freq: Once | ORAL | Status: AC
  Administered 2017-07-30: 500 mg via ORAL
  Filled 2017-07-30: qty 1

## 2017-07-30 MED ORDER — POTASSIUM CHLORIDE CRYS ER 20 MEQ PO TBCR
40.0000 meq | EXTENDED_RELEASE_TABLET | Freq: Once | ORAL | Status: AC
  Administered 2017-07-30: 40 meq via ORAL
  Filled 2017-07-30: qty 2

## 2017-07-30 MED ORDER — VITAMIN D3 25 MCG (1000 UNIT) PO TABS
1000.0000 [IU] | ORAL_TABLET | Freq: Every day | ORAL | Status: DC
Start: 2017-07-30 — End: 2017-08-01
  Administered 2017-07-30 – 2017-08-01 (×3): 1000 [IU] via ORAL
  Filled 2017-07-30 (×7): qty 1

## 2017-07-30 NOTE — ED Provider Notes (Signed)
[facility] EMERGENCY DEPARTMENT Provider Note   CSN: [id number] Arrival date & time: 07/30/17  0003  Time seen 02:10 AM   History   Chief Complaint Chief Complaint  Patient presents with  . Fall   Level 5 caveat for age  HPI Lori Bishop is a 84 y.o. female.  HPI patient lives home alone.  She was getting out of bed and she fell and hit her head on the bedside commode.  She states she was lying on the floor possibly an hour before she was able to get through to her [facility].  She complains of bruising of her left lower extremity, she came complains of low back pain and right leg pain and indicates all the way from her hip down to her knee but she also states that scolded and then she states is worse.  She has been complaining of her legs feeling weak.  PCP Avon Gully, MD   Past Medical History:  Diagnosis Date  . Bilateral chronic knee pain   . Carpal tunnel syndrome, bilateral   . Choledocholithiasis 3 2014 and 5 2014, Dec 2014   S/P ERCP and sphincterotomy x3  . Chronic back pain   . Chronic kidney disease   . GERD (gastroesophageal reflux disease)   . Gout   . Hypertension   . Leg cramps   . Osteoarthritis   . Spinal stenosis     Patient Active Problem List   Diagnosis Date Noted  . CAP (community acquired pneumonia) 06/13/2015  . Left knee pain 04/26/2015  . Right knee pain 04/26/2015  . Right upper quadrant pain   . Calculus of bile duct with acute cholangitis with obstruction   . Choledocholithiasis with cholecystitis   . Severe carpal tunnel syndrome 02/02/2014  . Hand weakness 02/02/2014  . Unspecified hereditary and idiopathic peripheral neuropathy 10/30/2013  . Choledocholithiasis 04/17/2013  . Abdominal pain 02/14/2013  . Acute pancreatitis 02/14/2013  . Elevated LFTs 02/14/2013  . Hepatic steatosis 02/14/2013  . Hyperbilirubinemia 02/14/2013  . Leukocytosis 02/14/2013  . Osteoarthritis 02/14/2013  . RUQ pain 07/07/2012  . VIRAL URI  02/17/2008  . Chronic pain of left knee 06/09/2007  . PLEURISY 09/03/2006  . LIVER FUNCTION TESTS, ABNORMAL 09/03/2006  . HYPOKALEMIA 06/14/2006  . CONSTIPATION 05/16/2006  . NUMBNESS, HAND 05/16/2006  . Hypoxemia 04/17/2006  . HYPERLIPIDEMIA 01/04/2006  . GOUT 01/04/2006  . CARPAL TUNNEL SYNDROME 01/04/2006  . CATARACT NOS 01/04/2006  . Essential hypertension 01/04/2006  . BRONCHITIS NOS 01/04/2006  . ARTHRITIS 01/04/2006  . SPINAL STENOSIS 01/04/2006  . OSTEOPOROSIS 01/04/2006    Past Surgical History:  Procedure Laterality Date  . APPENDECTOMY    . BALLOON DILATION  05/23/2012   Procedure: AMPULLARY BALLOON DILATION;  Surgeon: Corbin Ade, MD;  Location: [facility];  Service: Endoscopy;;  . BALLOON DILATION N/A 07/24/2012   Procedure: BALLOON DILATION; Balloon basket dredge and balloon dialate sphincterotomy;  Surgeon: Corbin Ade, MD;  Location: [facility];  Service: Endoscopy;  Laterality: N/A;  . BALLOON DILATION N/A 02/17/2013   Procedure: BALLOON DILATION;  Surgeon: Corbin Ade, MD;  Location: [facility];  Service: Endoscopy;  Laterality: N/A;  . BILIARY STENT PLACEMENT  05/23/2012   Procedure: BILIARY STENT PLACEMENT;  Surgeon: Corbin Ade, MD;  Location: [facility];  Service: Endoscopy;;  . BREAST BIOPSY Left   . CARPAL TUNNEL RELEASE Right   . CATARACT EXTRACTION Bilateral   . CHOLECYSTECTOMY    . CHOLEDOCHOENTEROSTOMY N/A 11/09/2014  Procedure: CHOLEDOCHODUODENOSTOMY;  Surgeon: Franky Macho, MD;  Location: [facility];  Service: General;  Laterality: N/A;  . ERCP  05/23/2012   RMR: juxta-ampullary duodenal diverticulum, markedly dilated biliary tree with choledocholithiasis, s/p sphincterotomy with biliary sphincterotomy balloon dilation, balloon and basket stone extraction, stent placement  . ERCP N/A 07/24/2012   EAV:[id number] duodenal diverticulum/s/p dilation sphincterotomy site s/p basket stone extraction s/p cholangioscopy with holmium lithotripsy of common duct stones   . ERCP N/A 02/17/2013   Dr. Jena Gauss: markedly dilated biliary tree with choledocholithiasis, s/p sphincterotomy and extraction  . HOLMIUM LASER APPLICATION N/A 07/24/2012   Procedure: HOLMIUM LASER APPLICATION;  Surgeon: Corbin Ade, MD;  Location: [facility];  Service: Endoscopy;  Laterality: N/A;  . REMOVAL OF STONES  05/23/2012   Procedure: BALLOON AND BASKET REMOVAL OF STONES;  Surgeon: Corbin Ade, MD;  Location: [facility];  Service: Endoscopy;;  . REMOVAL OF STONES N/A 07/24/2012   Procedure: REMOVAL OF STONES;  Surgeon: Corbin Ade, MD;  Location: [facility];  Service: Endoscopy;  Laterality: N/A;  . right femur     metal plate  . SPHINCTEROTOMY  05/23/2012   Procedure: SPHINCTEROTOMY;  Surgeon: Corbin Ade, MD;  Location: [facility];  Service: Endoscopy;;  . [name] CHOLANGIOSCOPY N/A 07/24/2012   Procedure: AOZHYQMV CHOLANGIOSCOPY;  Surgeon: Corbin Ade, MD;  Location: [facility];  Service: Endoscopy;  Laterality: N/A;  [name] LITHOTRIPSY N/A 07/24/2012   Procedure: HQIONGEX LITHOTRIPSY;  Surgeon: Corbin Ade, MD;  Location: [facility];  Service: Endoscopy;  Laterality: N/A;  . STONE EXTRACTION WITH BASKET N/A 02/17/2013   Procedure: STONE EXTRACTION WITH BASKET and BALLOON;  Surgeon: Corbin Ade, MD;  Location: [facility];  Service: Endoscopy;  Laterality: N/A;  . WRIST ARTHROPLASTY Right      OB History    Gravida  1   Para  1   Term  1   Preterm      AB      Living  0     SAB      TAB      Ectopic      Multiple      Live Births               Home Medications    Prior to Admission medications   Medication Sig Start Date End Date Taking? Authorizing Provider  allopurinol (ZYLOPRIM) 100 MG tablet Take 100 mg by mouth daily.    [provider]  cholecalciferol (VITAMIN D) 1000 units tablet Take 1,000 Units by mouth daily.    [provider]  felodipine (PLENDIL) 10 MG 24 hr tablet Take 10 mg by mouth daily.    [provider]    furosemide (LASIX) 40 MG tablet Take 40 mg by mouth 2 (two) times daily as needed for fluid or edema.     [provider]  gabapentin (NEURONTIN) 100 MG capsule Take 100 mg by mouth 3 (three) times daily.    [provider]  ibuprofen (ADVIL,MOTRIN) 200 MG tablet Take 200 mg by mouth every 6 (six) hours as needed.    [provider]  lisinopril (PRINIVIL,ZESTRIL) 10 MG tablet  07/01/17   [provider]  Misc. Devices (RING PESSARY/SUPPORT) MISC Place in vagina and keep in place Milex ring with support #4 07/16/17   [name], MD  pantoprazole (PROTONIX) 40 MG tablet Take 1 tablet (40 mg total) by mouth daily before breakfast. 06/16/15   Avon Gully,  MD  polyethylene glycol (MIRALAX / GLYCOLAX) packet Take 17 g by mouth daily as needed for moderate constipation.    [provider]  spironolactone (ALDACTONE) 25 MG tablet  07/08/17   [provider]  traMADol (ULTRAM) 50 MG tablet Take 1 tablet (50 mg total) by mouth every 6 (six) hours as needed. 02/05/17   Rolland Porter, MD    Family History Family History  Problem Relation Age of Onset  . CAD Mother   . Stroke Father   . Cancer Son   . Colon cancer Neg Hx     Social History Social History   Tobacco Use  . Smoking status: Former Smoker    Packs/day: 0.25    Years: 15.00    Pack years: 3.75    Types: Cigarettes    Last attempt to quit: 07/17/1988    Years since quitting: 29.0  . Smokeless tobacco: Never Used  Substance Use Topics  . Alcohol use: No  . Drug use: No  lives alone Uses a walker   Allergies   Patient has no known allergies.   Review of Systems Review of Systems  All other systems reviewed and are negative.    Physical Exam Updated Vital Signs BP (!) 142/78 (BP Location: Left Arm)   Pulse 88   Temp 98.4 F (36.9 C) (Oral)   Resp 20   Ht  (1.549 m)   Wt 55.3 kg (122 lb)   SpO2 96%   BMI 23.05 kg/m   Vital signs normal    Physical  Exam  Constitutional: She is oriented to person, place, and time.  Non-toxic appearance. She does not appear ill. She appears distressed.  Frail elderly female laying on her left side groaning  HENT:  Head: Normocephalic.  Right Ear: External ear normal.  Left Ear: External ear normal.  Nose: Nose normal. No mucosal edema or rhinorrhea.  Mouth/Throat: Oropharynx is clear and moist and mucous membranes are normal. No dental abscesses or uvula swelling.  Patient has a large hematoma on her forehead  Eyes: Pupils are equal, round, and reactive to light. Conjunctivae and EOM are normal.  Neck: Normal range of motion and full passive range of motion without pain. Neck supple.  Nontender  Cardiovascular: Normal rate, regular rhythm and normal heart sounds. Exam reveals no gallop and no friction rub.  No murmur heard. Pulmonary/Chest: Effort normal and breath sounds normal. No respiratory distress. She has no wheezes. She has no rhonchi. She has no rales. She exhibits no tenderness and no crepitus.  Abdominal: Soft. Normal appearance and bowel sounds are normal. She exhibits no distension. There is no tenderness. There is no rebound and no guarding.  Musculoskeletal: Normal range of motion. She exhibits no edema or tenderness.  Patient is tender in her lower lumbar spine and diffusely over her right hip and right knee without obvious deformity or swelling.  She indicates that all but then she also states is worse.  Patient is noted to have a hematoma of her left lower leg that is tender and new.  Neurological: She is alert and oriented to person, place, and time. She has normal strength. No cranial nerve deficit.  Skin: Skin is warm, dry and intact. No rash noted. No erythema. No pallor.  Psychiatric: She has a normal mood and affect. Her speech is normal and behavior is normal. Her mood appears not anxious.  Nursing note and vitals reviewed.         ED Treatments /  Results  Labs (all  labs ordered are listed, but only abnormal results are displayed)  Results for orders placed or performed during the hospital encounter of 07/30/17  Comprehensive metabolic panel  Result Value Ref Range   Sodium 141 135 - 145 mmol/L   Potassium 2.6 (LL) 3.5 - 5.1 mmol/L   Chloride 102 101 - 111 mmol/L   CO2 27 22 - 32 mmol/L   Glucose, Bld 99 65 - 99 mg/dL   BUN 23 (H) 6 - 20 mg/dL   Creatinine, Ser 4.09 (H) 0.44 - 1.00 mg/dL   Calcium 9.2 8.9 - 81.1 mg/dL   Total Protein 6.8 6.5 - 8.1 g/dL   Albumin 3.6 3.5 - 5.0 g/dL   AST 27 15 - 41 U/L   ALT 14 14 - 54 U/L   Alkaline Phosphatase 95 38 - 126 U/L   Total Bilirubin 1.2 0.3 - 1.2 mg/dL   GFR calc non Af Amer 32 (L) >60 mL/min   GFR calc Af Amer 37 (L) >60 mL/min   Anion gap 12 5 - 15  CBC with Differential  Result Value Ref Range   WBC 18.5 (H) 4.0 - 10.5 K/uL   RBC 4.82 3.87 - 5.11 MIL/uL   Hemoglobin 13.6 12.0 - 15.0 g/dL   HCT 91.4 78.2 - 95.6 %   MCV 85.5 78.0 - 100.0 fL   MCH 28.2 26.0 - 34.0 pg   MCHC 33.0 30.0 - 36.0 g/dL   RDW 21.3 08.6 - 57.8 %   Platelets 264 150 - 400 K/uL   Neutrophils Relative % 77 %   Neutro Abs 14.2 (H) 1.7 - 7.7 K/uL   Lymphocytes Relative 13 %   Lymphs Abs 2.4 0.7 - 4.0 K/uL   Monocytes Relative 9 %   Monocytes Absolute 1.7 (H) 0.1 - 1.0 K/uL   Eosinophils Relative 1 %   Eosinophils Absolute 0.1 0.0 - 0.7 K/uL   Basophils Relative 0 %   Basophils Absolute 0.0 0.0 - 0.1 K/uL  Urinalysis, Routine w reflex microscopic  Result Value Ref Range   Color, Urine YELLOW YELLOW   APPearance CLOUDY (A) CLEAR   Specific Gravity, Urine 1.009 1.005 - 1.030   pH 5.0 5.0 - 8.0   Glucose, UA NEGATIVE NEGATIVE mg/dL   Hgb urine dipstick NEGATIVE NEGATIVE   Bilirubin Urine NEGATIVE NEGATIVE   Ketones, ur NEGATIVE NEGATIVE mg/dL   Protein, ur NEGATIVE NEGATIVE mg/dL   Nitrite NEGATIVE NEGATIVE   Leukocytes, UA TRACE (A) NEGATIVE   RBC / HPF 0-5 0 - 5 RBC/hpf   WBC, UA 0-5 0 - 5 WBC/hpf    Bacteria, UA RARE (A) NONE SEEN   Squamous Epithelial / LPF 21-50 0 - 5   Mucus PRESENT   CK  Result Value Ref Range   Total CK 286 (H) 38 - 234 U/L   Laboratory interpretation all normal except significant hypokalemia, patient is on Lasix   EKG None  Radiology Dg Lumbar Spine Complete  Result Date: 07/30/2017 CLINICAL DATA:  Fall. EXAM: LUMBAR SPINE - COMPLETE 4+ VIEW COMPARISON:  09/12/2016 FINDINGS: Lumbar scoliosis convex towards the left. Diffuse degenerative change throughout the lumbar spine with narrowed interspaces and endplate hypertrophic changes throughout. No anterior subluxations. No vertebral compression deformities. No focal bone lesion or bone destruction. Degenerative disc disease at multiple levels. Vascular calcifications in the aorta. Visualized sacrum appears intact. IMPRESSION: Lumbar scoliosis and degenerative changes. No acute displaced fractures identified. Electronically Signed   By: Chrissie Noa  Andria Meuse M.D.   On: 07/30/2017 03:26   Dg Tibia/fibula Left  Result Date: 07/30/2017 CLINICAL DATA:  Fall.  Purple area on the left lower leg. EXAM: LEFT TIBIA AND FIBULA - 2 VIEW COMPARISON:  None. FINDINGS: Diffuse bone demineralization. Degenerative changes in the knee with narrowed medial and lateral compartments and small osteophyte formation. Cartilage calcification. Tibia and fibula appear intact. No evidence of acute fracture or dislocation. Bone cortex appears intact. Scattered calcifications in the soft tissues consistent with vascular calcifications and phleboliths. IMPRESSION: Degenerative changes in the left knee.  No acute bony abnormalities. Electronically Signed   By: Burman Nieves M.D.   On: 07/30/2017 03:23   Ct Head Wo Contrast  Result Date: 07/30/2017 CLINICAL DATA:  Fall with head trauma EXAM: CT HEAD WITHOUT CONTRAST TECHNIQUE: Contiguous axial images were obtained from the base of the skull through the vertex without intravenous contrast. COMPARISON:   None. FINDINGS: Brain: There is no mass, hemorrhage or extra-axial collection. The size and configuration of the ventricles and extra-axial CSF spaces are normal. There is no acute or chronic infarction. The brain parenchyma is normal. Vascular: No abnormal hyperdensity of the major intracranial arteries or dural venous sinuses. No intracranial atherosclerosis. Skull: Small anterior midline scalp hematoma.  No skull fracture. Sinuses/Orbits: No fluid levels or advanced mucosal thickening of the visualized paranasal sinuses. No mastoid or middle ear effusion. The orbits are normal. IMPRESSION: 1. Normal aging brain.  No acute intracranial abnormality. 2. Small anterior midline scalp hematoma without skull fracture. Electronically Signed   By: Deatra Robinson M.D.   On: 07/30/2017 02:57   Dg Knee Complete 4 Views Right  Result Date: 07/30/2017 CLINICAL DATA:  Fall. EXAM: RIGHT KNEE - COMPLETE 4+ VIEW COMPARISON:  None. FINDINGS: Degenerative changes in the right knee with narrowed medial compartment and mild cartilaginous calcification. No evidence of acute fracture or dislocation. No focal bone lesion or bone destruction. Bone cortex appears intact. No significant effusion. Vascular calcifications. IMPRESSION: Degenerative changes in the right knee. No acute bony abnormalities. Electronically Signed   By: Burman Nieves M.D.   On: 07/30/2017 03:27   Dg Hip Unilat W Or Wo Pelvis 2-3 Views Right  Result Date: 07/30/2017 CLINICAL DATA:  Fall. EXAM: DG HIP (WITH OR WITHOUT PELVIS) 2-3V RIGHT COMPARISON:  None. FINDINGS: Postoperative right hip hemiarthroplasty with non cemented femoral component. Component appears well seated. No evidence of acute fracture or dislocation. Degenerative changes in the lower lumbar spine and in the left hip. Pelvis appears intact. No evidence of acute fracture or dislocation. Sacrum is mostly obscured by overlying bowel gas. IMPRESSION: Right hip hemiarthroplasty. No acute fracture  or dislocation. Electronically Signed   By: Burman Nieves M.D.   On: 07/30/2017 03:25    Procedures Procedures (including critical care time)  Medications Ordered in ED Medications  0.9 %  sodium chloride infusion (has no administration in time range)  potassium chloride SA (K-DUR,KLOR-CON) CR tablet 40 mEq (has no administration in time range)  acetaminophen (TYLENOL) tablet 500 mg (500 mg Oral Given 07/30/17 0404)     Initial Impression / Assessment and Plan / ED Course  I have reviewed the triage vital signs and the nursing notes.  Pertinent labs & imaging results that were available during my care of the patient were reviewed by me and considered in my medical decision making (see chart for details).   X-rays were obtained of the area she states hurts.  Due to her complaints of weakness laboratory  evaluation was also done.  Patient was noted to have a moderately severe hypokalemia.  EKG was done.  This may explain why her legs are weak and she fell.  She was started on oral potassium since she is able to eat or drink.  I talked to the patient and her family member about admission for treatment of her low potassium which will hopefully improve her complaints of weakness.  And they are agreeable.  04:05 AM Dr Sherryll Burger will admit  Final Clinical Impressions(s) / ED Diagnoses   Final diagnoses:  Weakness  Hypokalemia  Contusion of other part of head, initial encounter  Contusion of left lower leg, initial encounter    Plan admission  Devoria Albe, MD, Concha Pyo, MD 07/30/17 [id number]

## 2017-07-30 NOTE — H&P (Signed)
History and Physical    Lori Bishop ZOX:[id number] DOB: 10-04-1920 DOA: 07/30/2017  PCP: Avon Gully, MD   Patient coming from: Home  Chief Complaint: Fall  HPI: Lori Bishop is a 84 y.o. female with medical history significant for osteoarthritis, GERD, gout, hypertension, grade 1 chronic diastolic CHF, and spinal stenosis who presented to the emergency department earlier this morning after she fell and hit her head on the bedside commode while getting out of bed.  She typically ambulates with a walker and lives independently.  She states that her legs have been progressively weakening over the last several days and she could not support herself well, causing her fall.  She denies any loss of consciousness.  She is lying on the floor for about an hour before she was able to get through to [facility] and they had called her niece to check up on her who then brought her to the ED.  She also hit her left shin and has some bruising there. Denies any other recent illnesses and takes her medications as prescribed.  She denies any chest pain, abdominal pain, nausea, vomiting, diarrhea, and denies any change in appetite.   ED Course: Vital signs are stable with unremarkable changes on EKG.  Laboratory data with some leukocytosis of 18,500 and potassium is 2.6.  BUN 23 and creatinine 1.38 which appears to be near her baseline.  Multiple imaging studies including CT head with no acute findings aside from scalp hematoma on head noted.  She has been started on some gentle IV fluid and will be receiving some oral potassium.  She appears to be in minimal pain currently and is lucid and answering questions appropriately.  Niece is at bedside.  Review of Systems: All others reviewed and otherwise negative.  Past Medical History:  Diagnosis Date  . Bilateral chronic knee pain   . Carpal tunnel syndrome, bilateral   . Choledocholithiasis 3 2014 and 5 2014, Dec 2014   S/P ERCP and sphincterotomy x3  .  Chronic back pain   . Chronic kidney disease   . GERD (gastroesophageal reflux disease)   . Gout   . Hypertension   . Leg cramps   . Osteoarthritis   . Spinal stenosis     Past Surgical History:  Procedure Laterality Date  . APPENDECTOMY    . BALLOON DILATION  05/23/2012   Procedure: AMPULLARY BALLOON DILATION;  Surgeon: Corbin Ade, MD;  Location: [facility];  Service: Endoscopy;;  . BALLOON DILATION N/A 07/24/2012   Procedure: BALLOON DILATION; Balloon basket dredge and balloon dialate sphincterotomy;  Surgeon: Corbin Ade, MD;  Location: [facility];  Service: Endoscopy;  Laterality: N/A;  . BALLOON DILATION N/A 02/17/2013   Procedure: BALLOON DILATION;  Surgeon: Corbin Ade, MD;  Location: [facility];  Service: Endoscopy;  Laterality: N/A;  . BILIARY STENT PLACEMENT  05/23/2012   Procedure: BILIARY STENT PLACEMENT;  Surgeon: Corbin Ade, MD;  Location: [facility];  Service: Endoscopy;;  . BREAST BIOPSY Left   . CARPAL TUNNEL RELEASE Right   . CATARACT EXTRACTION Bilateral   . CHOLECYSTECTOMY    . CHOLEDOCHOENTEROSTOMY N/A 11/09/2014   Procedure: CHOLEDOCHODUODENOSTOMY;  Surgeon: Franky Macho, MD;  Location: [facility];  Service: General;  Laterality: N/A;  . ERCP  05/23/2012   RMR: juxta-ampullary duodenal diverticulum, markedly dilated biliary tree with choledocholithiasis, s/p sphincterotomy with biliary sphincterotomy balloon dilation, balloon and basket stone extraction, stent placement  . ERCP N/A 07/24/2012   [id number] duodenal  diverticulum/s/p dilation sphincterotomy site s/p basket stone extraction s/p cholangioscopy with holmium lithotripsy of common duct stones  . ERCP N/A 02/17/2013   Dr. Jena Gauss: markedly dilated biliary tree with choledocholithiasis, s/p sphincterotomy and extraction  . HOLMIUM LASER APPLICATION N/A 07/24/2012   Procedure: HOLMIUM LASER APPLICATION;  Surgeon: Corbin Ade, MD;  Location: [facility];  Service: Endoscopy;  Laterality: N/A;  . REMOVAL OF STONES   05/23/2012   Procedure: BALLOON AND BASKET REMOVAL OF STONES;  Surgeon: Corbin Ade, MD;  Location: [facility];  Service: Endoscopy;;  . REMOVAL OF STONES N/A 07/24/2012   Procedure: REMOVAL OF STONES;  Surgeon: Corbin Ade, MD;  Location: [facility];  Service: Endoscopy;  Laterality: N/A;  . right femur     metal plate  . SPHINCTEROTOMY  05/23/2012   Procedure: SPHINCTEROTOMY;  Surgeon: Corbin Ade, MD;  Location: [facility];  Service: Endoscopy;;  . [name] CHOLANGIOSCOPY N/A 07/24/2012   Procedure: ZOXWRUEA CHOLANGIOSCOPY;  Surgeon: Corbin Ade, MD;  Location: [facility];  Service: Endoscopy;  Laterality: N/A;  [name] LITHOTRIPSY N/A 07/24/2012   Procedure: VWUJWJXB LITHOTRIPSY;  Surgeon: Corbin Ade, MD;  Location: [facility];  Service: Endoscopy;  Laterality: N/A;  . STONE EXTRACTION WITH BASKET N/A 02/17/2013   Procedure: STONE EXTRACTION WITH BASKET and BALLOON;  Surgeon: Corbin Ade, MD;  Location: [facility];  Service: Endoscopy;  Laterality: N/A;  . WRIST ARTHROPLASTY Right      reports that she quit smoking about 29 years ago. Her smoking use included cigarettes. She has a 3.75 pack-year smoking history. She has never used smokeless tobacco. She reports that she does not drink alcohol or use drugs.  No Known Allergies  Family History  Problem Relation Age of Onset  . CAD Mother   . Stroke Father   . Cancer Son   . Colon cancer Neg Hx     Prior to Admission medications   Medication Sig Start Date End Date Taking? Authorizing Provider  allopurinol (ZYLOPRIM) 100 MG tablet Take 100 mg by mouth daily.    [provider]  cholecalciferol (VITAMIN D) 1000 units tablet Take 1,000 Units by mouth daily.    [provider]  felodipine (PLENDIL) 10 MG 24 hr tablet Take 10 mg by mouth daily.    [provider]  furosemide (LASIX) 40 MG tablet Take 40 mg by mouth 2 (two) times daily as needed for fluid or edema.     [provider]  gabapentin  (NEURONTIN) 100 MG capsule Take 100 mg by mouth 3 (three) times daily.    [provider]  ibuprofen (ADVIL,MOTRIN) 200 MG tablet Take 200 mg by mouth every 6 (six) hours as needed.    [provider]  lisinopril (PRINIVIL,ZESTRIL) 10 MG tablet  07/01/17   [provider]  Misc. Devices (RING PESSARY/SUPPORT) MISC Place in vagina and keep in place Milex ring with support #4 07/16/17   [name], MD  pantoprazole (PROTONIX) 40 MG tablet Take 1 tablet (40 mg total) by mouth daily before breakfast. 06/16/15   Avon Gully, MD  polyethylene glycol (MIRALAX / GLYCOLAX) packet Take 17 g by mouth daily as needed for moderate constipation.    [provider]  spironolactone (ALDACTONE) 25 MG tablet  07/08/17   [provider]  traMADol (ULTRAM) 50 MG tablet Take 1 tablet (50 mg total) by mouth every 6 (six) hours as needed. 02/05/17   Rolland Porter, MD  Physical Exam: Vitals:   07/30/17 0006 07/30/17 0008  BP:  (!) 142/78  Pulse:  88  Resp:  20  Temp:  98.4 F (36.9 C)  TempSrc:  Oral  SpO2:  96%  Weight: 55.3 kg (122 lb)   Height:  (1.549 m)     Constitutional: NAD, calm, comfortable Vitals:   07/30/17 0006 07/30/17 0008  BP:  (!) 142/78  Pulse:  88  Resp:  20  Temp:  98.4 F (36.9 C)  TempSrc:  Oral  SpO2:  96%  Weight: 55.3 kg (122 lb)   Height:  (1.549 m)    Eyes: lids and conjunctivae normal ENMT: Mucous membranes are dry.  Neck: normal, supple Respiratory: clear to auscultation bilaterally. Normal respiratory effort. No accessory muscle use.  Cardiovascular: Regular rate and rhythm, no murmurs. No extremity edema. Abdomen: no tenderness, no distention. Bowel sounds positive.  Musculoskeletal:  No joint deformity upper and lower extremities.   Skin: Bruising to left shin as well as hematoma noted to forehead.  Approximately 3 cm in diameter and tender to touch.  Labs on Admission: I have personally reviewed following  labs and imaging studies  CBC: Recent Labs  Lab 07/30/17 0239  WBC 18.5*  NEUTROABS 14.2*  HGB 13.6  HCT 41.2  MCV 85.5  PLT 264   Basic Metabolic Panel: Recent Labs  Lab 07/30/17 0239  NA 141  K 2.6*  CL 102  CO2 27  GLUCOSE 99  BUN 23*  CREATININE 1.38*  CALCIUM 9.2   GFR: Estimated Creatinine Clearance: 18.8 mL/min (A) (by C-G formula based on SCr of 1.38 mg/dL (H)). Liver Function Tests: Recent Labs  Lab 07/30/17 0239  AST 27  ALT 14  ALKPHOS 95  BILITOT 1.2  PROT 6.8  ALBUMIN 3.6   No results for input(s): LIPASE, AMYLASE in the last 168 hours. No results for input(s): AMMONIA in the last 168 hours. Coagulation Profile: No results for input(s): INR, PROTIME in the last 168 hours. Cardiac Enzymes: Recent Labs  Lab 07/30/17 0239  CKTOTAL 286*   BNP (last 3 results) No results for input(s): PROBNP in the last 8760 hours. HbA1C: No results for input(s): HGBA1C in the last 72 hours. CBG: No results for input(s): GLUCAP in the last 168 hours. Lipid Profile: No results for input(s): CHOL, HDL, LDLCALC, TRIG, CHOLHDL, LDLDIRECT in the last 72 hours. Thyroid Function Tests: No results for input(s): TSH, T4TOTAL, FREET4, T3FREE, THYROIDAB in the last 72 hours. Anemia Panel: No results for input(s): VITAMINB12, FOLATE, FERRITIN, TIBC, IRON, RETICCTPCT in the last 72 hours. Urine analysis:    Component Value Date/Time   COLORURINE YELLOW 07/30/2017 0250   APPEARANCEUR CLOUDY (A) 07/30/2017 0250   LABSPEC 1.009 07/30/2017 0250   PHURINE 5.0 07/30/2017 0250   GLUCOSEU NEGATIVE 07/30/2017 0250   HGBUR NEGATIVE 07/30/2017 0250   HGBUR negative 08/04/2007 1029   BILIRUBINUR NEGATIVE 07/30/2017 0250   KETONESUR NEGATIVE 07/30/2017 0250   PROTEINUR NEGATIVE 07/30/2017 0250   UROBILINOGEN 2.0 (H) 11/01/2014 1830   NITRITE NEGATIVE 07/30/2017 0250   LEUKOCYTESUR TRACE (A) 07/30/2017 0250    Radiological Exams on Admission: Dg Lumbar Spine  Complete  Result Date: 07/30/2017 CLINICAL DATA:  Fall. EXAM: LUMBAR SPINE - COMPLETE 4+ VIEW COMPARISON:  09/12/2016 FINDINGS: Lumbar scoliosis convex towards the left. Diffuse degenerative change throughout the lumbar spine with narrowed interspaces and endplate hypertrophic changes throughout. No anterior subluxations. No vertebral compression deformities. No focal bone lesion or bone destruction.  Degenerative disc disease at multiple levels. Vascular calcifications in the aorta. Visualized sacrum appears intact. IMPRESSION: Lumbar scoliosis and degenerative changes. No acute displaced fractures identified. Electronically Signed   By: Burman Nieves M.D.   On: 07/30/2017 03:26   Dg Tibia/fibula Left  Result Date: 07/30/2017 CLINICAL DATA:  Fall.  Purple area on the left lower leg. EXAM: LEFT TIBIA AND FIBULA - 2 VIEW COMPARISON:  None. FINDINGS: Diffuse bone demineralization. Degenerative changes in the knee with narrowed medial and lateral compartments and small osteophyte formation. Cartilage calcification. Tibia and fibula appear intact. No evidence of acute fracture or dislocation. Bone cortex appears intact. Scattered calcifications in the soft tissues consistent with vascular calcifications and phleboliths. IMPRESSION: Degenerative changes in the left knee.  No acute bony abnormalities. Electronically Signed   By: Burman Nieves M.D.   On: 07/30/2017 03:23   Ct Head Wo Contrast  Result Date: 07/30/2017 CLINICAL DATA:  Fall with head trauma EXAM: CT HEAD WITHOUT CONTRAST TECHNIQUE: Contiguous axial images were obtained from the base of the skull through the vertex without intravenous contrast. COMPARISON:  None. FINDINGS: Brain: There is no mass, hemorrhage or extra-axial collection. The size and configuration of the ventricles and extra-axial CSF spaces are normal. There is no acute or chronic infarction. The brain parenchyma is normal. Vascular: No abnormal hyperdensity of the major  intracranial arteries or dural venous sinuses. No intracranial atherosclerosis. Skull: Small anterior midline scalp hematoma.  No skull fracture. Sinuses/Orbits: No fluid levels or advanced mucosal thickening of the visualized paranasal sinuses. No mastoid or middle ear effusion. The orbits are normal. IMPRESSION: 1. Normal aging brain.  No acute intracranial abnormality. 2. Small anterior midline scalp hematoma without skull fracture. Electronically Signed   By: Deatra Robinson M.D.   On: 07/30/2017 02:57   Dg Knee Complete 4 Views Right  Result Date: 07/30/2017 CLINICAL DATA:  Fall. EXAM: RIGHT KNEE - COMPLETE 4+ VIEW COMPARISON:  None. FINDINGS: Degenerative changes in the right knee with narrowed medial compartment and mild cartilaginous calcification. No evidence of acute fracture or dislocation. No focal bone lesion or bone destruction. Bone cortex appears intact. No significant effusion. Vascular calcifications. IMPRESSION: Degenerative changes in the right knee. No acute bony abnormalities. Electronically Signed   By: Burman Nieves M.D.   On: 07/30/2017 03:27   Dg Hip Unilat W Or Wo Pelvis 2-3 Views Right  Result Date: 07/30/2017 CLINICAL DATA:  Fall. EXAM: DG HIP (WITH OR WITHOUT PELVIS) 2-3V RIGHT COMPARISON:  None. FINDINGS: Postoperative right hip hemiarthroplasty with non cemented femoral component. Component appears well seated. No evidence of acute fracture or dislocation. Degenerative changes in the lower lumbar spine and in the left hip. Pelvis appears intact. No evidence of acute fracture or dislocation. Sacrum is mostly obscured by overlying bowel gas. IMPRESSION: Right hip hemiarthroplasty. No acute fracture or dislocation. Electronically Signed   By: Burman Nieves M.D.   On: 07/30/2017 03:25    EKG: Independently reviewed. SR 91bpm.  Assessment/Plan Principal Problem:   Fall Active Problems:   GOUT   Hypokalemia   Essential hypertension   Arthropathy   Leukocytosis    GERD (gastroesophageal reflux disease)   Chronic diastolic CHF (congestive heart failure) (HCC)    1. Fall secondary to lower extremity weakness related to hypokalemia.  This appears to be due to her Lasix use and she does appear to be somewhat dry.  Will focus on potassium replacement and will check magnesium levels and monitor on telemetry.  Fall  precautions and PT evaluation with possible need for placement due to advanced age and risk for further falls given frailty.  Hold Lasix and spironolactone. 2. Mild dehydration.  Continue with IV fluid and will add potassium to this.  Avoid diuretics. 3. Leukocytosis.  Continue to monitor with repeat CBC, but there appears to be no acute sign of infection.  Urine analysis with no acute findings. 4. Scalp hematoma.  Apply ice to the area.  Avoid blood thinners at this time and place on SCDs. 5. Hypertension.  Continue home medications aside from diuretics. 6. Gout.  Continue allopurinol. 7. GERD.  Continue PPI.   DVT prophylaxis: SCDs Code Status: DNR Family Communication: Niece at bedside Disposition Plan:Treatment of hypokalemia and PT evaluation/possible placement Consults called:None Admission status: Obs, tele   Shondra Capps Hoover Brunette DO [facility] Pager [phone]  If 7PM-7AM, please contact night-coverage [email or web address] Password [facility]  07/30/2017, 4:26 AM

## 2017-07-30 NOTE — ED Notes (Signed)
CRITICAL VALUE ALERT  Critical Value:  Potassium 2.6 Date & Time Notied:  07/30/17 Provider Notified: I Knapp Orders Received/Actions taken: None yet

## 2017-07-30 NOTE — Progress Notes (Addendum)
PROGRESS NOTE                                                                                                                                                                                                             Patient Demographics:    Lori Bishop, is a 84 y.o. female, DOB - 13-Apr-1920, KVQ:[id number]  Admit date - 07/30/2017   Admitting Physician Pratik Hoover Brunette, DO  Outpatient Primary MD for the patient is Avon Gully, MD  LOS - 0  Outpatient Specialists none  Chief Complaint  Patient presents with  . Fall       Brief Narrative Please refer to H&P from this morning for detail, 84 year old fairly independent female with hypertension, osteoarthritis, grade 1 diastolic dysfunction and spinal stenosis presented with mechanical fall while getting off of commode and trying to use her walker.  No loss of consciousness.  She was on the floor for almost an hour before she was able to get up and use her lifeline. In the ED vitals were stable, she had leukocytosis of 18. 5K and severe hypokalemia of 2.6 and mildly elevated CPK. CT head showed anterior scalp hematoma, x-rays of the spine and hip were negative for acute injury or fracture. Placed on observation and PT evaluation.   Subjective:   Complains of some pain in the back and her leg from the fall.   Assessment  & Plan :    Principal Problem: Mechanical fall at home Sustained scalp hematoma and no other injuries.  Seen by PT and recommends SNF. Social work consulted.  Check orthostatic vital.  Active Problems: Severe hypokalemia Secondary to dehydration and concomitant use of Lasix which is held.  Replenishing with both p.o. and KCl and IV fluids.  Magnesium normal.  Essential hypertension Continue low the pain lisinopril.  Aldactone and Lasix held.     Chronic diastolic CHF (congestive heart failure) (HCC) Hypovolemic.  Monitor with  fluids.  Leukocytosis with?  Early pneumonia seen on chest x-ray. Patient has no other constitutional symptoms.  Will hold off on antibiotics.  Follow-up chest x-ray in 4 weeks recommended.    Code Status : DNR  Family Communication  : Niece at bedside  Disposition Plan  : SNF possibly tomorrow if improved and potassium normalized  Barriers For Discharge : Active symptoms  Consults  : None  Procedures  : CT head  DVT Prophylaxis  :  Lovenox -  Lab Results  Component Value Date   PLT 264 07/30/2017    Antibiotics  :   Anti-infectives (From admission, onward)   None        Objective:   Vitals:   07/30/17 0610 07/30/17 0847 07/30/17 0849 07/30/17 0852  BP: 131/68 (!) 116/49 123/64 116/61  Pulse: 81 83 83 89  Resp:  18    Temp: 98.3 F (36.8 C) 97.9 F (36.6 C)    TempSrc: Oral Oral    SpO2: 96% 97% 100% 98%  Weight:      Height:        Wt Readings from Last 3 Encounters:  07/30/17 55.3 kg (122 lb)  07/23/17 55.3 kg (122 lb)  07/16/17 55.3 kg (122 lb)     Intake/Output Summary (Last 24 hours) at 07/30/2017 1140 Last data filed at 07/30/2017 0818 Gross per 24 hour  Intake 177.5 ml  Output 300 ml  Net -122.5 ml     Physical Exam  Gen: not in distress HEENT: no pallor, moist mucosa, supple neck, anterior scalp hematoma Chest: clear b/l, no added sounds CVS: N S1&S2, no murmurs,  GI: soft, NT, ND,  Musculoskeletal: warm, no edema     Data Review:    CBC Recent Labs  Lab 07/30/17 0239  WBC 18.5*  HGB 13.6  HCT 41.2  PLT 264  MCV 85.5  MCH 28.2  MCHC 33.0  RDW 14.7  LYMPHSABS 2.4  MONOABS 1.7*  EOSABS 0.1  BASOSABS 0.0    Chemistries  Recent Labs  Lab 07/30/17 0239  NA 141  K 2.6*  CL 102  CO2 27  GLUCOSE 99  BUN 23*  CREATININE 1.38*  CALCIUM 9.2  MG 2.0  AST 27  ALT 14  ALKPHOS 95  BILITOT 1.2   ------------------------------------------------------------------------------------------------------------------ No  results for input(s): CHOL, HDL, LDLCALC, TRIG, CHOLHDL, LDLDIRECT in the last 72 hours.  No results found for: HGBA1C ------------------------------------------------------------------------------------------------------------------ No results for input(s): TSH, T4TOTAL, T3FREE, THYROIDAB in the last 72 hours.  Invalid input(s): FREET3 ------------------------------------------------------------------------------------------------------------------ No results for input(s): VITAMINB12, FOLATE, FERRITIN, TIBC, IRON, RETICCTPCT in the last 72 hours.  Coagulation profile No results for input(s): INR, PROTIME in the last 168 hours.  No results for input(s): DDIMER in the last 72 hours.  Cardiac Enzymes No results for input(s): CKMB, TROPONINI, MYOGLOBIN in the last 168 hours.  Invalid input(s): CK ------------------------------------------------------------------------------------------------------------------ No results found for: BNP  Inpatient Medications  Scheduled Meds: . allopurinol  100 mg Oral Daily  . cholecalciferol  1,000 Units Oral Daily  . felodipine  10 mg Oral Daily  . gabapentin  100 mg Oral TID  . lisinopril  10 mg Oral Daily  . pantoprazole  40 mg Oral QAC breakfast  . potassium chloride  40 mEq Oral BID   Continuous Infusions: . 0.9 % NaCl with KCl 40 mEq / L 75 mL/hr (07/30/17 0610)   PRN Meds:.acetaminophen **OR** acetaminophen, ondansetron **OR** ondansetron (ZOFRAN) IV, polyethylene glycol, traMADol  Micro Results No results found for this or any previous visit (from the past 240 hour(s)).  Radiology Reports Dg Lumbar Spine Complete  Result Date: 07/30/2017 CLINICAL DATA:  Fall. EXAM: LUMBAR SPINE - COMPLETE 4+ VIEW COMPARISON:  09/12/2016 FINDINGS: Lumbar scoliosis convex towards the left. Diffuse degenerative change throughout the lumbar spine with narrowed interspaces and endplate hypertrophic changes throughout. No anterior subluxations. No vertebral  compression deformities. No focal bone  lesion or bone destruction. Degenerative disc disease at multiple levels. Vascular calcifications in the aorta. Visualized sacrum appears intact. IMPRESSION: Lumbar scoliosis and degenerative changes. No acute displaced fractures identified. Electronically Signed   By: Burman Nieves M.D.   On: 07/30/2017 03:26   Dg Tibia/fibula Left  Result Date: 07/30/2017 CLINICAL DATA:  Fall.  Purple area on the left lower leg. EXAM: LEFT TIBIA AND FIBULA - 2 VIEW COMPARISON:  None. FINDINGS: Diffuse bone demineralization. Degenerative changes in the knee with narrowed medial and lateral compartments and small osteophyte formation. Cartilage calcification. Tibia and fibula appear intact. No evidence of acute fracture or dislocation. Bone cortex appears intact. Scattered calcifications in the soft tissues consistent with vascular calcifications and phleboliths. IMPRESSION: Degenerative changes in the left knee.  No acute bony abnormalities. Electronically Signed   By: Burman Nieves M.D.   On: 07/30/2017 03:23   Ct Head Wo Contrast  Result Date: 07/30/2017 CLINICAL DATA:  Fall with head trauma EXAM: CT HEAD WITHOUT CONTRAST TECHNIQUE: Contiguous axial images were obtained from the base of the skull through the vertex without intravenous contrast. COMPARISON:  None. FINDINGS: Brain: There is no mass, hemorrhage or extra-axial collection. The size and configuration of the ventricles and extra-axial CSF spaces are normal. There is no acute or chronic infarction. The brain parenchyma is normal. Vascular: No abnormal hyperdensity of the major intracranial arteries or dural venous sinuses. No intracranial atherosclerosis. Skull: Small anterior midline scalp hematoma.  No skull fracture. Sinuses/Orbits: No fluid levels or advanced mucosal thickening of the visualized paranasal sinuses. No mastoid or middle ear effusion. The orbits are normal. IMPRESSION: 1. Normal aging brain.  No acute  intracranial abnormality. 2. Small anterior midline scalp hematoma without skull fracture. Electronically Signed   By: Deatra Robinson M.D.   On: 07/30/2017 02:57   Dg Chest Port 1 View  Result Date: 07/30/2017 CLINICAL DATA:  Elevated white blood cell count. Former smoker. History of hypertension, chronic CHF. EXAM: PORTABLE CHEST 1 VIEW COMPARISON:  PA and lateral chest x-ray of September 12, 2016 FINDINGS: The lungs are well-expanded. There is a trace of pleural fluid on the right. The interstitial markings are coarse. The lung markings are increased in the left suprahilar region. The heart is normal in size. The pulmonary vascularity is not engorged. There is calcification in the wall of the aortic arch. There is mild chronic soft tissue fullness in the right paratracheal region superiorly. There are degenerative changes of both shoulders. IMPRESSION: Chronic bronchitic-smoking related changes with superimposed atelectasis or early pneumonia in the left upper lobe. No CHF. Followup PA and lateral chest X-ray is recommended in 3-4 weeks following trial of antibiotic therapy to ensure resolution and exclude underlying malignancy. Thoracic aortic atherosclerosis. Electronically Signed   By: David  [place] M.D.   On: 07/30/2017 09:53   Dg Knee Complete 4 Views Right  Result Date: 07/30/2017 CLINICAL DATA:  Fall. EXAM: RIGHT KNEE - COMPLETE 4+ VIEW COMPARISON:  None. FINDINGS: Degenerative changes in the right knee with narrowed medial compartment and mild cartilaginous calcification. No evidence of acute fracture or dislocation. No focal bone lesion or bone destruction. Bone cortex appears intact. No significant effusion. Vascular calcifications. IMPRESSION: Degenerative changes in the right knee. No acute bony abnormalities. Electronically Signed   By: Burman Nieves M.D.   On: 07/30/2017 03:27   Dg Hip Unilat W Or Wo Pelvis 2-3 Views Right  Result Date: 07/30/2017 CLINICAL DATA:  Fall. EXAM: DG HIP (WITH OR  WITHOUT  PELVIS) 2-3V RIGHT COMPARISON:  None. FINDINGS: Postoperative right hip hemiarthroplasty with non cemented femoral component. Component appears well seated. No evidence of acute fracture or dislocation. Degenerative changes in the lower lumbar spine and in the left hip. Pelvis appears intact. No evidence of acute fracture or dislocation. Sacrum is mostly obscured by overlying bowel gas. IMPRESSION: Right hip hemiarthroplasty. No acute fracture or dislocation. Electronically Signed   By: Burman Nieves M.D.   On: 07/30/2017 03:25    Time Spent in minutes  15   Takhia Spoon M.D on 07/30/2017 at 11:40 AM  Between 7am to 7pm - Pager - [phone]  After 7pm go to [email or web address] - password [facility]  [facility] -  Office  [phone]

## 2017-07-30 NOTE — ED Triage Notes (Signed)
Pt brought in by rcmes for c/o fall; pt was getting up to go to bathroom and fell hitting her head on the bedside table; pt has large bruised knot to middle of forehead and purple area to lower left leg

## 2017-07-30 NOTE — Plan of Care (Signed)
  Problem: Acute Rehab PT Goals(only PT should resolve) Goal: Pt Will Go Supine/Side To Sit Outcome: Progressing Flowsheets (Taken 07/30/2017 1047) Pt will go Supine/Side to Sit: with modified independence Goal: Patient Will Transfer Sit To/From Stand Outcome: Progressing Flowsheets (Taken 07/30/2017 1047) Patient will transfer sit to/from stand: with min guard assist Goal: Pt Will Transfer Bed To Chair/Chair To Bed Outcome: Progressing Flowsheets (Taken 07/30/2017 1047) Pt will Transfer Bed to Chair/Chair to Bed: min guard assist Goal: Pt Will Ambulate Outcome: Progressing Flowsheets (Taken 07/30/2017 1047) Pt will Ambulate: 25 feet;with minimal assist;with rolling walker  10:48 AM, 07/30/17 Ocie Bob, MPT Physical Therapist with [facility] [phone] office [phone] mobile phone

## 2017-07-30 NOTE — Care Management Obs Status (Signed)
MEDICARE OBSERVATION STATUS NOTIFICATION   Patient Details  Name: CONLEY PAWLING MRN: [id number] Date of Birth: [birth year]   Medicare Observation Status Notification Given:  Yes    Malcolm Metro, RN 07/30/2017, 11:00 AM

## 2017-07-30 NOTE — Progress Notes (Signed)
BP soft 90s/50s mmhg. Patient asymptomatic. Resumed fluids and BP meds held.

## 2017-07-30 NOTE — Clinical Social Work Note (Signed)
Clinical Social Work Assessment  Patient Details  Name: Lori Bishop MRN: [id number] Date of Birth: 02/04/1921  Date of referral:  07/30/17               Reason for consult:  Facility Placement                Permission sought to share information with:    Permission granted to share information::     Name::        Agency::     Relationship::     Contact Information:     Housing/Transportation Living arrangements for the past 2 months:  Single Family Home Source of Information:  Patient Patient Interpreter Needed:  None Criminal Activity/Legal Involvement Pertinent to Current Situation/Hospitalization:  No - Comment as needed Significant Relationships:  Other Family Members Lives with:  Self Do you feel safe going back to the place where you live?  Yes Need for family participation in patient care:  Yes (Comment)  Care giving concerns:  Patient has aids seven days per week. She states that one of her aids lives across the street from her.    Social Worker assessment / plan:  Patient indicated that she is currently uncertain as to whether she wants to go to STR at [facility]. She stated that she would speak with her niece about it and let LCSW know. She stated that she already has aids daily and would prefer to remain in her home.  LCSW will follow up with patient related to discharge plan.   Employment status:  Retired Database administrator PT Recommendations:  Skilled Nursing Facility Information / Referral to community resources:  Skilled Nursing Facility  Patient/Family's Response to care:  Patient is not certain that she wants to go to STR at [facility].   Patient/Family's Understanding of and Emotional Response to Diagnosis, Current Treatment, and Prognosis:  Patient understands her diagnois, treatment and prognosis and is uncertain on her discharge plan.   Emotional Assessment Appearance:  Appears stated age Attitude/Demeanor/Rapport:    Affect (typically  observed):  Calm Orientation:  Oriented to Self, Oriented to Place, Oriented to  Time, Oriented to Situation Alcohol / Substance use:  Not Applicable Psych involvement (Current and /or in the community):  No (Comment)  Discharge Needs  Concerns to be addressed:  Discharge Planning Concerns Readmission within the last 30 days:  No Current discharge risk:  None Barriers to Discharge:  No Barriers Identified   Annice Needy, LCSW 07/30/2017, 3:37 PM

## 2017-07-30 NOTE — Progress Notes (Signed)
Patient noted to have lower BP running 90s-50s this evening, asymptomatic. Text paged Dr. Gonzella Lex to notify. Earnstine Regal, RN

## 2017-07-30 NOTE — Evaluation (Signed)
Physical Therapy Evaluation Patient Details Name: Lori Bishop MRN: [id number] SCHLAUCH0[birth year] Today's Date: 07/30/2017   History of Present Illness  Lori Bishop is a 84 y.o. female with medical history significant for osteoarthritis, GERD, gout, hypertension, grade 1 chronic diastolic CHF, and spinal stenosis who presented to the emergency department earlier this morning after she fell and hit her head on the bedside commode while getting out of bed.  She typically ambulates with a walker and lives independently.  She states that her legs have been progressively weakening over the last several days and she could not support herself well, causing her fall.  She denies any loss of consciousness.  She is lying on the floor for about an hour before she was able to get through to [facility] and they had called her niece to check up on her who then brought her to the ED.  She also hit her left shin and has some bruising there.    Clinical Impression  Patient limited for functional mobility as stated below secondary to BLE weakness, fatigue and poor standing balance.  Patient tolerated sitting up in chair with family member present after therapy.   Patient will benefit from continued physical therapy in hospital and recommended venue below to increase strength, balance, endurance for safe ADLs and gait.     Follow Up Recommendations SNF;Supervision/Assistance - 24 hour    Equipment Recommendations  None recommended by PT    Recommendations for Other Services       Precautions / Restrictions Precautions Precautions: Fall Restrictions Weight Bearing Restrictions: No      Mobility  Bed Mobility Overal bed mobility: Needs Assistance Bed Mobility: Supine to Sit     Supine to sit: Min guard     General bed mobility comments: slow labored movement  Transfers Overall transfer level: Needs assistance Equipment used: Rolling walker (2 wheeled) Transfers: Sit to/from [employer] Sit to Stand: Min assist Stand pivot transfers: Min assist;Mod assist       General transfer comment: unsteady on feet  Ambulation/Gait Ambulation/Gait assistance: Mod assist Ambulation Distance (Feet): 3 Feet Assistive device: Rolling walker (2 wheeled) Gait Pattern/deviations: Decreased step length - right;Decreased step length - left;Decreased stride length Gait velocity: slow   General Gait Details: limited to 5-6 slow unsteady steps due to BLE weakness, poor standing balance  Stairs            Wheelchair Mobility    Modified Rankin (Stroke Patients Only)       Balance Overall balance assessment: Needs assistance Sitting-balance support: Feet supported;No upper extremity supported Sitting balance-Leahy Scale: Good     Standing balance support: Bilateral upper extremity supported;During functional activity Standing balance-Leahy Scale: Poor Standing balance comment: using RW                             Pertinent Vitals/Pain Pain Assessment: 0-10 Pain Score: 6  Pain Location: low back, BLE Pain Descriptors / Indicators: Aching;Discomfort Pain Intervention(s): Limited activity within patient's tolerance;Monitored during session    Home Living Family/patient expects to be discharged to:: Private residence Living Arrangements: Alone Available Help at Discharge: Personal care attendant Type of Home: House Home Access: Stairs to enter   [employer] of Steps: 1   Home Equipment: Environmental consultant - 2 wheels;Shower seat;Bedside commode      Prior Function Level of Independence: Needs assistance   Gait / Transfers Assistance Needed: household ambulator with RW  ADL's / Homemaking Assistance Needed: has home aides up to 3 hours/day x 7 days/week, alone at night        Hand Dominance        Extremity/Trunk Assessment   Upper Extremity Assessment Upper Extremity Assessment: Generalized weakness    Lower Extremity  Assessment Lower Extremity Assessment: Generalized weakness    Cervical / Trunk Assessment Cervical / Trunk Assessment: Kyphotic  Communication   Communication: No difficulties  Cognition Arousal/Alertness: Awake/alert Behavior During Therapy: WFL for tasks assessed/performed Overall Cognitive Status: Within Functional Limits for tasks assessed                                        General Comments      Exercises     Assessment/Plan    PT Assessment Patient needs continued PT services  PT Problem List Decreased strength;Decreased balance;Decreased mobility;Decreased activity tolerance       PT Treatment Interventions Gait training;Stair training;Functional mobility training;Therapeutic activities;Therapeutic exercise;Patient/family education    PT Goals (Current goals can be found in the Care Plan section)  Acute Rehab PT Goals Patient Stated Goal: return home after rehab PT Goal Formulation: With patient/family Time For Goal Achievement: 08/13/17 Potential to Achieve Goals: Good    Frequency Min 3X/week   Barriers to discharge        Co-evaluation               AM-PAC PT "6 Clicks" Daily Activity  Outcome Measure Difficulty turning over in bed (including adjusting bedclothes, sheets and blankets)?: None Difficulty moving from lying on back to sitting on the side of the bed? : A Little Difficulty sitting down on and standing up from a chair with arms (e.g., wheelchair, bedside commode, etc,.)?: A Little Help needed moving to and from a bed to chair (including a wheelchair)?: A Lot Help needed walking in hospital room?: A Lot Help needed climbing 3-5 steps with a railing? : A Lot 6 Click Score: 16    End of Session Equipment Utilized During Treatment: Gait belt Activity Tolerance: Patient tolerated treatment well;Patient limited by fatigue Patient left: in chair;with call bell/phone within reach;with chair alarm set;with family/visitor  present Nurse Communication: Mobility status PT Visit Diagnosis: Unsteadiness on feet (R26.81);Other abnormalities of gait and mobility (R26.89);Muscle weakness (generalized) (M62.81)    Time: 0981-1914 PT Time Calculation (min) (ACUTE ONLY): 30 min   Charges:   PT Evaluation $PT Eval Moderate Complexity: 1 Mod PT Treatments $Therapeutic Activity: 23-37 mins   PT G Codes:        10:45 AM, 08-17-17 Ocie Bob, MPT Physical Therapist with [facility] [phone] office [phone] mobile phone

## 2017-07-31 DIAGNOSIS — E876 Hypokalemia: Secondary | ICD-10-CM

## 2017-07-31 DIAGNOSIS — D72829 Elevated white blood cell count, unspecified: Secondary | ICD-10-CM | POA: Diagnosis not present

## 2017-07-31 DIAGNOSIS — R531 Weakness: Secondary | ICD-10-CM | POA: Diagnosis not present

## 2017-07-31 DIAGNOSIS — K219 Gastro-esophageal reflux disease without esophagitis: Secondary | ICD-10-CM | POA: Diagnosis not present

## 2017-07-31 DIAGNOSIS — S0083XA Contusion of other part of head, initial encounter: Secondary | ICD-10-CM | POA: Diagnosis not present

## 2017-07-31 DIAGNOSIS — W19XXXD Unspecified fall, subsequent encounter: Secondary | ICD-10-CM | POA: Diagnosis not present

## 2017-07-31 DIAGNOSIS — E875 Hyperkalemia: Secondary | ICD-10-CM | POA: Diagnosis not present

## 2017-07-31 DIAGNOSIS — I10 Essential (primary) hypertension: Secondary | ICD-10-CM

## 2017-07-31 DIAGNOSIS — I5032 Chronic diastolic (congestive) heart failure: Secondary | ICD-10-CM

## 2017-07-31 LAB — BASIC METABOLIC PANEL
ANION GAP: 3 — AB (ref 5–15)
ANION GAP: 5 (ref 5–15)
BUN: 21 mg/dL — ABNORMAL HIGH (ref 6–20)
BUN: 20 mg/dL (ref 6–20)
CHLORIDE: 111 mmol/L (ref 101–111)
CO2: 26 mmol/L (ref 22–32)
CO2: 26 mmol/L (ref 22–32)
Calcium: 8.5 mg/dL — ABNORMAL LOW (ref 8.9–10.3)
Calcium: 9.1 mg/dL (ref 8.9–10.3)
Chloride: 112 mmol/L — ABNORMAL HIGH (ref 101–111)
Creatinine, Ser: 1.17 mg/dL — ABNORMAL HIGH (ref 0.44–1.00)
Creatinine, Ser: 1.14 mg/dL — ABNORMAL HIGH (ref 0.44–1.00)
GFR calc non Af Amer: 40 mL/min — ABNORMAL LOW (ref >60)
GFR, EST AFRICAN AMERICAN: 45 mL/min — AB (ref >60)
GFR, EST AFRICAN AMERICAN: 46 mL/min — AB (ref >60)
GFR, EST NON AFRICAN AMERICAN: 39 mL/min — AB (ref >60)
Glucose, Bld: 83 mg/dL (ref 65–99)
Glucose, Bld: 83 mg/dL (ref 65–99)
Potassium: 6.1 mmol/L — ABNORMAL HIGH (ref 3.5–5.1)
Potassium: 5.7 mmol/L — ABNORMAL HIGH (ref 3.5–5.1)
SODIUM: 141 mmol/L (ref 135–145)
SODIUM: 142 mmol/L (ref 135–145)

## 2017-07-31 LAB — CBC
HCT: 33.5 % — ABNORMAL LOW (ref 36.0–46.0)
HEMOGLOBIN: 11 g/dL — AB (ref 12.0–15.0)
MCH: 28.8 pg (ref 26.0–34.0)
MCHC: 32.8 g/dL (ref 30.0–36.0)
MCV: 87.7 fL (ref 78.0–100.0)
Platelets: 217 10*3/uL (ref 150–400)
RBC: 3.82 MIL/uL — AB (ref 3.87–5.11)
RDW: 15.2 % (ref 11.5–15.5)
WBC: 11.7 10*3/uL — AB (ref 4.0–10.5)

## 2017-07-31 MED ORDER — SODIUM CHLORIDE 0.9 % IV SOLN
INTRAVENOUS | Status: DC
  Administered 2017-07-31 (×2): via INTRAVENOUS

## 2017-07-31 NOTE — Care Management Note (Signed)
Case Management Note  Patient Details  Name: RYANN PAULI MRN: [id number] Date of Birth: 01-27-1921  Subjective/Objective:   Pt from home, lives alone. She is mostly ind with ADL's. She has aid that is in her home daily. She has a niece who checks on her daily also. She has RW to use with ambulation.                 Action/Plan: Pt has been recommended for SNF. Pt declines. HH PT offered and pt refuses HH services also, says she knows what to do, can do it herself, feels worse when HH leaves. CM has discussed pt's plan with niece.   Expected Discharge Date:      08/01/17            Expected Discharge Plan:  Home/Self Care  In-House Referral:  Clinical Social Work  Discharge planning Services  CM Consult  Post Acute Care Choice:  NA Choice offered to:  NA  DME Arranged:    DME Agency:     HH Arranged:    HH Agency:     Status of Service:  Completed, signed off  If discussed at [employer], dates discussed:    Additional Comments:  Malcolm Metro, RN 07/31/2017, 11:44 AM

## 2017-07-31 NOTE — Clinical Social Work Note (Signed)
Patient is going home with HHPT. She declines short term rehab at skilled nursing facility.    LCSW signing off.     Mandel Seiden, Juleen China, LCSW

## 2017-07-31 NOTE — Progress Notes (Signed)
Physical Therapy Treatment Patient Details Name: Lori Bishop MRN: [id number] DOB: 07-Jul-1920 Today's Date: 07/31/2017    History of Present Illness Lori Bishop lives by herself.  She was brought to the hospital after she fell.  PT states she feels that her walker was not locked and she pulled up on it making it move and thus she lost her balance.  PT Denies dizziness, tripping or her legs giving away.    PT Comments    PT agreeable to therapy.  Able to complete all tasks with supervision but pt is fatigued at the end of treatment.   Follow Up Recommendations  SNF;Supervision/Assistance - 24 hour     Equipment Recommendations  None recommended by PT    Recommendations for Other Services  SNF/ 24 hr supervision      Precautions / Restrictions Precautions Precautions: Fall Restrictions Weight Bearing Restrictions: No    Mobility  Bed Mobility Overal bed mobility: Modified Independent Bed Mobility: Supine to Sit     Supine to sit: Supervision     General bed mobility comments: slow labored movement  Transfers Overall transfer level: Modified independent Equipment used: Rolling walker (2 wheeled) Transfers: Sit to/from [employer] Sit to Stand: Supervision Stand pivot transfers: Supervision          Ambulation/Gait Ambulation/Gait assistance: Supervision Ambulation Distance (Feet): 85 Feet Assistive device: Rolling walker (2 wheeled) Gait Pattern/deviations: Decreased step length - right;Decreased step length - left;Decreased stride length Gait velocity: slow           Cognition Arousal/Alertness: Awake/alert Behavior During Therapy: WFL for tasks assessed/performed Overall Cognitive Status: Within Functional Limits for tasks assessed                                        Exercises General Exercises - Lower Extremity Ankle Circles/Pumps: Both;10 reps Quad Sets: Both;10 reps Gluteal Sets: Both;10 reps Hip  ABduction/ADduction: Both;10 reps    General Comments  PT states that she feels weak.  Very agreeable to therapy       Pertinent Vitals/Pain  headache 3/10;  Pt has been medicated already     Home Living    PT lives alone                   Prior Function   uses RW at home.  Family assists with housework and shopping          PT Goals (current goals can now be found in the care plan section)      Frequency    Min 3X/week      PT Plan      Co-evaluation              AM-PAC PT "6 Clicks" Daily Activity  Outcome Measure                   End of Session Equipment Utilized During Treatment: Gait belt Activity Tolerance: Patient tolerated treatment well;Patient limited by fatigue Patient left: in chair;with call bell/phone within reach;with chair alarm set Nurse Communication: Mobility status PT Visit Diagnosis: Muscle weakness (generalized) (M62.81);History of falling (Z91.81)     Time: 4098-1191 PT Time Calculation (min) (ACUTE ONLY): 25 min  Charges:  $Gait Training: 8-22 mins $Therapeutic Exercise: 8-22 mins                    G Codes:  Virgina Organ, PT CLT [phone] 07/31/2017, 9:12 AM

## 2017-07-31 NOTE — Progress Notes (Signed)
PROGRESS NOTE                                                                                                                                                                                                             Patient Demographics:    Lori Bishop, is a 84 y.o. female, DOB - 1920/07/16, MWU:[id number]  Admit date - 07/30/2017   Admitting Physician Pratik Hoover Brunette, DO  Outpatient Primary MD for the patient is Avon Gully, MD  LOS - 0  Outpatient Specialists none  Chief Complaint  Patient presents with  . Fall       Brief Narrative Please refer to H&P from this morning for detail, 84 year old fairly independent female with hypertension, osteoarthritis, grade 1 diastolic dysfunction and spinal stenosis presented with mechanical fall while getting off of commode and trying to use her walker.  No loss of consciousness.  She was on the floor for almost an hour before she was able to get up and use her lifeline. In the ED vitals were stable, she had leukocytosis of 18. 5K and severe hypokalemia of 2.6 and mildly elevated CPK. CT head showed anterior scalp hematoma, x-rays of the spine and hip were negative for acute injury or fracture. Placed on observation and PT evaluation.   Subjective:  Feeling better, denying chest pain, no shortness of breath, no nausea, no vomiting.  Patient looking to go home once medically stable.   Assessment  & Plan :   Mechanical fall at home -Sustained scalp hematoma and and multiple bruises affecting her face -No other injuries.   -Seen by PT and recommends SNF.  But patient has declined and instead is looking to go home with home health services. -Appreciate assistance by Child psychotherapist and case Production designer, theatre/television/film.  Essential hypertension -Continue holding Aldactone and Lasix -Will also hold lisinopril due to episodes of orthostatic changes and elevated potassium. Follow vital  signs  Hyperkalemia/hypokalemia: -We will stop potassium supplementation (which was low at the moment of admission) -Continued use normal saline and repeat basic metabolic panel in a.m. -Telemetry no suggesting any abnormalities of concerns for electrolytes improvement.  Continue to monitor.  Chronic diastolic CHF (congestive heart failure) (HCC) -Compensated; in fact patient was hypovolemic to dry state on admission. -Continue holding diuretics -Follow daily weights and strict intake and output -Low-sodium diet -For now continue  fluid resuscitation.  Leukocytosis with?  Early pneumonia seen on chest x-ray. -There is no constitutional symptoms suggesting acute lung infections. -Will continue monitoring patient off antibiotics -Continue supportive care. -WBCs has trended down with fluid resuscitation's only.    Code Status : DNR  Family Communication  : no family at bedside.  Disposition Plan  : Patient has declined skilled nursing facility and would like to go home with home health services.  Barriers For Discharge : Active symptoms  Consults  : None  Procedures  : CT head  DVT Prophylaxis  :  Lovenox -  Lab Results  Component Value Date   PLT 217 07/31/2017    Antibiotics  :   Anti-infectives (From admission, onward)   None        Objective:   Vitals:   07/31/17 0632 07/31/17 0845 07/31/17 1309 07/31/17 1636  BP: (!) 109/55 117/65 (!) 115/56 112/70  Pulse: 61 77 76 65  Resp: Temp: 97.6 F (36.4 C) 99.2 F (37.3 C) 97.8 F (36.6 C) 97.8 F (36.6 C)  TempSrc: Oral Oral Oral Oral  SpO2: 96% 97% 100% 100%  Weight:      Height:        Wt Readings from Last 3 Encounters:  07/30/17 55.3 kg (122 lb)  07/23/17 55.3 kg (122 lb)  07/16/17 55.3 kg (122 lb)     Intake/Output Summary (Last 24 hours) at 07/31/2017 1847 Last data filed at 07/31/2017 1636 Gross per 24 hour  Intake 2003.75 ml  Output 300 ml  Net 1703.75 ml     Physical  Exam  Gen: Patient in no acute distress, denies chest pain, no shortness of breath, no nausea, no vomiting.  Reports she is feeling better and is looking to go home at discharge.  HEENT: Positive scalp hematoma, multiple bruises affecting forehead on her face, no open wounds, no thrush, no ears or nostrils discharges appreciated on exam.  Moist mucous membranes. Chest: Good air movement bilaterally, no wheezing, no crackles, no using accessory muscles.  Good oxygen saturation on room air. CVS: S 1 and S2, no rubs, no gallops, no murmurs.  No JVD on exam. GI: Soft, nontender, nondistended, positive bowel sounds. Musculoskeletal: No edema, no cyanosis, no clubbing.     Data Review:    CBC Recent Labs  Lab 07/30/17 0239 07/31/17 0418  WBC 18.5* 11.7*  HGB 13.6 11.0*  HCT 41.2 33.5*  PLT 264 217  MCV 85.5 87.7  MCH 28.2 28.8  MCHC 33.0 32.8  RDW 14.7 15.2  LYMPHSABS 2.4  --   MONOABS 1.7*  --   EOSABS 0.1  --   BASOSABS 0.0  --     Chemistries  Recent Labs  Lab 07/30/17 0239 07/31/17 0418 07/31/17 0904  NA 141 141 142  K 2.6* 6.1* 5.7*  CL 102 112* 111  CO2 GLUCOSE 99 83 83  BUN 23* 21* 20  CREATININE 1.38* 1.17* 1.14*  CALCIUM 9.2 8.5* 9.1  MG 2.0  --   --   AST 27  --   --   ALT 14  --   --   ALKPHOS 95  --   --   BILITOT 1.2  --   --    Inpatient Medications  Scheduled Meds: . allopurinol  100 mg Oral Daily  . cholecalciferol  1,000 Units Oral Daily  . gabapentin  100 mg Oral TID  . pantoprazole  40 mg  Oral QAC breakfast   Continuous Infusions: . sodium chloride 75 mL/hr at 07/31/17 1039   PRN Meds:.acetaminophen **OR** acetaminophen, ondansetron **OR** ondansetron (ZOFRAN) IV, polyethylene glycol, traMADol  Micro Results No results found for this or any previous visit (from the past 240 hour(s)).  Radiology Reports Dg Lumbar Spine Complete  Result Date: 07/30/2017 CLINICAL DATA:  Fall. EXAM: LUMBAR SPINE - COMPLETE 4+ VIEW COMPARISON:   09/12/2016 FINDINGS: Lumbar scoliosis convex towards the left. Diffuse degenerative change throughout the lumbar spine with narrowed interspaces and endplate hypertrophic changes throughout. No anterior subluxations. No vertebral compression deformities. No focal bone lesion or bone destruction. Degenerative disc disease at multiple levels. Vascular calcifications in the aorta. Visualized sacrum appears intact. IMPRESSION: Lumbar scoliosis and degenerative changes. No acute displaced fractures identified. Electronically Signed   By: Burman Nieves M.D.   On: 07/30/2017 03:26   Dg Tibia/fibula Left  Result Date: 07/30/2017 CLINICAL DATA:  Fall.  Purple area on the left lower leg. EXAM: LEFT TIBIA AND FIBULA - 2 VIEW COMPARISON:  None. FINDINGS: Diffuse bone demineralization. Degenerative changes in the knee with narrowed medial and lateral compartments and small osteophyte formation. Cartilage calcification. Tibia and fibula appear intact. No evidence of acute fracture or dislocation. Bone cortex appears intact. Scattered calcifications in the soft tissues consistent with vascular calcifications and phleboliths. IMPRESSION: Degenerative changes in the left knee.  No acute bony abnormalities. Electronically Signed   By: Burman Nieves M.D.   On: 07/30/2017 03:23   Ct Head Wo Contrast  Result Date: 07/30/2017 CLINICAL DATA:  Fall with head trauma EXAM: CT HEAD WITHOUT CONTRAST TECHNIQUE: Contiguous axial images were obtained from the base of the skull through the vertex without intravenous contrast. COMPARISON:  None. FINDINGS: Brain: There is no mass, hemorrhage or extra-axial collection. The size and configuration of the ventricles and extra-axial CSF spaces are normal. There is no acute or chronic infarction. The brain parenchyma is normal. Vascular: No abnormal hyperdensity of the major intracranial arteries or dural venous sinuses. No intracranial atherosclerosis. Skull: Small anterior midline scalp  hematoma.  No skull fracture. Sinuses/Orbits: No fluid levels or advanced mucosal thickening of the visualized paranasal sinuses. No mastoid or middle ear effusion. The orbits are normal. IMPRESSION: 1. Normal aging brain.  No acute intracranial abnormality. 2. Small anterior midline scalp hematoma without skull fracture. Electronically Signed   By: Deatra Robinson M.D.   On: 07/30/2017 02:57   Dg Chest Port 1 View  Result Date: 07/30/2017 CLINICAL DATA:  Elevated white blood cell count. Former smoker. History of hypertension, chronic CHF. EXAM: PORTABLE CHEST 1 VIEW COMPARISON:  PA and lateral chest x-ray of September 12, 2016 FINDINGS: The lungs are well-expanded. There is a trace of pleural fluid on the right. The interstitial markings are coarse. The lung markings are increased in the left suprahilar region. The heart is normal in size. The pulmonary vascularity is not engorged. There is calcification in the wall of the aortic arch. There is mild chronic soft tissue fullness in the right paratracheal region superiorly. There are degenerative changes of both shoulders. IMPRESSION: Chronic bronchitic-smoking related changes with superimposed atelectasis or early pneumonia in the left upper lobe. No CHF. Followup PA and lateral chest X-ray is recommended in 3-4 weeks following trial of antibiotic therapy to ensure resolution and exclude underlying malignancy. Thoracic aortic atherosclerosis. Electronically Signed   By: David  [place] M.D.   On: 07/30/2017 09:53   Dg Knee Complete 4 Views Right  Result Date: 07/30/2017  CLINICAL DATA:  Fall. EXAM: RIGHT KNEE - COMPLETE 4+ VIEW COMPARISON:  None. FINDINGS: Degenerative changes in the right knee with narrowed medial compartment and mild cartilaginous calcification. No evidence of acute fracture or dislocation. No focal bone lesion or bone destruction. Bone cortex appears intact. No significant effusion. Vascular calcifications. IMPRESSION: Degenerative changes in the  right knee. No acute bony abnormalities. Electronically Signed   By: Burman Nieves M.D.   On: 07/30/2017 03:27   Dg Hip Unilat W Or Wo Pelvis 2-3 Views Right  Result Date: 07/30/2017 CLINICAL DATA:  Fall. EXAM: DG HIP (WITH OR WITHOUT PELVIS) 2-3V RIGHT COMPARISON:  None. FINDINGS: Postoperative right hip hemiarthroplasty with non cemented femoral component. Component appears well seated. No evidence of acute fracture or dislocation. Degenerative changes in the lower lumbar spine and in the left hip. Pelvis appears intact. No evidence of acute fracture or dislocation. Sacrum is mostly obscured by overlying bowel gas. IMPRESSION: Right hip hemiarthroplasty. No acute fracture or dislocation. Electronically Signed   By: Burman Nieves M.D.   On: 07/30/2017 03:25    Time Spent in minutes  15   Vassie Loll M.D on 07/31/2017 at 6:47 PM  Between 7am to 7pm - Pager - [phone]  After 7pm go to [email or web address] - password [facility]  [facility] -  Office  [phone]

## 2017-08-01 DIAGNOSIS — S0083XA Contusion of other part of head, initial encounter: Secondary | ICD-10-CM | POA: Diagnosis not present

## 2017-08-01 DIAGNOSIS — W19XXXD Unspecified fall, subsequent encounter: Secondary | ICD-10-CM | POA: Diagnosis not present

## 2017-08-01 DIAGNOSIS — I10 Essential (primary) hypertension: Secondary | ICD-10-CM | POA: Diagnosis not present

## 2017-08-01 DIAGNOSIS — K219 Gastro-esophageal reflux disease without esophagitis: Secondary | ICD-10-CM

## 2017-08-01 DIAGNOSIS — E875 Hyperkalemia: Secondary | ICD-10-CM

## 2017-08-01 DIAGNOSIS — I5032 Chronic diastolic (congestive) heart failure: Secondary | ICD-10-CM | POA: Diagnosis not present

## 2017-08-01 DIAGNOSIS — R531 Weakness: Secondary | ICD-10-CM | POA: Diagnosis not present

## 2017-08-01 LAB — BASIC METABOLIC PANEL
ANION GAP: 4 — AB (ref 5–15)
BUN: 15 mg/dL (ref 6–20)
CALCIUM: 8.4 mg/dL — AB (ref 8.9–10.3)
CHLORIDE: 111 mmol/L (ref 101–111)
CO2: 24 mmol/L (ref 22–32)
Creatinine, Ser: 0.89 mg/dL (ref 0.44–1.00)
GFR calc non Af Amer: 54 mL/min — ABNORMAL LOW (ref >60)
Glucose, Bld: 87 mg/dL (ref 65–99)
Potassium: 4.4 mmol/L (ref 3.5–5.1)
SODIUM: 139 mmol/L (ref 135–145)

## 2017-08-01 MED ORDER — TRAMADOL HCL 50 MG PO TABS: 50.0000 mg | ORAL_TABLET | Freq: Three times a day (TID) | ORAL | Status: DC | PRN

## 2017-08-01 MED ORDER — FUROSEMIDE 40 MG PO TABS: 40.0000 mg | ORAL_TABLET | Freq: Every day | ORAL | Status: AC | PRN

## 2017-08-01 NOTE — Discharge Planning (Signed)
Patient IV removed.  RN assessment and VS revealed stability for DC to home.  Discharge papers given, explained and educated.  Informed of suggested FU appts and appts made.  Scripts called in to [employer], per patient family request.  Patient wheeled to front and family transporting home via car.

## 2017-08-01 NOTE — Discharge Summary (Signed)
Physician Discharge Summary  Lori Bishop ZOX:[id number] DOB: [birth year] DOA: 07/30/2017  PCP: Avon Gully, MD  Admit date: 07/30/2017 Discharge date: 08/01/2017  Time spent: 35 minutes  Recommendations for Outpatient Follow-up:  Repeat basic metabolic panel to follow electrolytes and renal function Reassess blood pressure and adjust antihypertensive regimen as needed Patient need to reassess for safety reason perform her activities of daily living and if needed initiates home health services (something that she has declined at discharge) versus further discussion regarding transitioning to ALF.  Discharge Diagnoses:  Principal Problem:   Fall Active Problems:   GOUT   Hypokalemia   Essential hypertension   Arthropathy   Leukocytosis   GERD (gastroesophageal reflux disease)   Chronic diastolic CHF (congestive heart failure) (HCC)   Weakness   Hyperkalemia   Discharge Condition: Stable and improved.  Patient discharged home with instruction to follow-up in 10 days.  Patient was supposed to go home at least with home health services to assure safety and home adaptation, while working on conditioning. PT even recommended SNF; but patient declined both.  Diet recommendation: Heart healthy diet.  Filed Weights   07/30/17 0006  Weight: 55.3 kg (122 lb)    History of present illness:  Please refer to H&P from this morning for detail, 84 year old fairly independent female with hypertension, osteoarthritis, grade 1 diastolic dysfunction and spinal stenosis presented with mechanical fall while getting off of commode and trying to use her walker.  No loss of consciousness.  She was on the floor for almost an hour before she was able to get up and use her lifeline. In the ED vitals were stable, she had leukocytosis of 18. 5K and severe hypokalemia of 2.6 and mildly elevated CPK. CT head showed anterior scalp hematoma, x-rays of the spine and hip were negative for acute injury or  fracture. Placed on observation and PT evaluation.  Hospital Course:  Mechanical fall at home -Sustained scalp hematoma and multiple bruises affecting her face -No other injuries.   -Seen by PT and recommends SNF.  But patient has declined and instead is looking to go home with home aid and family care. She declined home health services for PT, OT, RN and SW at discharge. -Appreciate assistance by Child psychotherapist and case Production designer, theatre/television/film.  Essential hypertension -aldactone discontinued -resume low dose CCB and lisinopril -continue PRN lasix -advise to follow low sodium diet -reassessment BP at follow up visit recommended   Hyperkalemia/hypokalemia: -stop potassium supplementation (which was low at the moment of admission) -Continued using normal saline and repeat basic metabolic panel in a.m. -Telemetry didn't suggest any abnormalities of concerns for electrolytes impairment.   -at discharge her electrolytes were back to normal range -repeat BMET at follow up visit.  Chronic diastolic CHF (congestive heart failure) (HCC) -Compensated; in fact patient was hypovolemic to dry state on admission. -ok to resume adjusted dose of diuretics at discharge -Follow daily weights and encourage to pursuit Low-sodium diet -advise to maintain good hydration   Leukocytosis with ?? atelectasis and Early pneumonia seen on chest x-ray. -There is no constitutional symptoms suggesting acute lung infections. -no antibiotics given -WBCs has trended down with fluid resuscitation only. -recommending close monitoring and repeat CXR in the next 4-6 weeks.    Procedures:  See below for x-ray reports  Consultations:  None  Discharge Exam: Vitals:   08/01/17 0030 08/01/17 0430  BP: 99/74 (!) 111/58  Pulse: 64 66  Resp:    Temp: 97.7 F (36.5 C) 98.2 F (  36.8 C)  SpO2: 95% 97%   Gen: Patient in no acute distress, denies chest pain, no shortness of breath, no nausea, no vomiting.  Reports she is  feeling better and is looking to go home at discharge.  HEENT: Positive scalp hematoma, multiple bruises affecting forehead on her face, no open wounds, no thrush, no ears or nostrils discharges appreciated on exam.  Moist mucous membranes. Chest: Good air movement bilaterally, no wheezing, no crackles, no using accessory muscles.  Good oxygen saturation on room air. CVS: S 1 and S2, no rubs, no gallops, no murmurs.  No JVD on exam. GI: Soft, nontender, nondistended, positive bowel sounds. Musculoskeletal: No edema, no cyanosis, no clubbing.   Discharge Instructions   Discharge Instructions    Discharge instructions   Complete by:  As directed    Take medications as prescribed  Arrange follow-up with PCP in 10 days Keep yourself well-hydrated Follow low-sodium/heart healthy diet   Increase activity slowly   Complete by:  As directed      Allergies as of 08/01/2017   No Known Allergies     Medication List    STOP taking these medications   spironolactone 25 MG tablet Commonly known as:  ALDACTONE     TAKE these medications   allopurinol 100 MG tablet Commonly known as:  ZYLOPRIM Take 100 mg by mouth daily.   cholecalciferol 1000 units tablet Commonly known as:  VITAMIN D Take 1,000 Units by mouth daily.   felodipine 10 MG 24 hr tablet Commonly known as:  PLENDIL Take 10 mg by mouth daily.   furosemide 40 MG tablet Commonly known as:  LASIX Take 1 tablet (40 mg total) by mouth daily as needed for fluid or edema. What changed:  when to take this   gabapentin 100 MG capsule Commonly known as:  NEURONTIN Take 100 mg by mouth 3 (three) times daily.   ibuprofen 200 MG tablet Commonly known as:  ADVIL,MOTRIN Take 200 mg by mouth every 6 (six) hours as needed.   lisinopril 10 MG tablet Commonly known as:  PRINIVIL,ZESTRIL Take 10 mg by mouth daily.   pantoprazole 40 MG tablet Commonly known as:  PROTONIX Take 1 tablet (40 mg total) by mouth daily before  breakfast.   polyethylene glycol packet Commonly known as:  MIRALAX / GLYCOLAX Take 17 g by mouth daily as needed for moderate constipation.   Ring Pessary/Support Misc Place in vagina and keep in place Milex ring with support #4   traMADol 50 MG tablet Commonly known as:  ULTRAM Take 1 tablet (50 mg total) by mouth every 8 (eight) hours as needed. What changed:  when to take this      No Known Allergies Follow-up Information    Avon Gully, MD. Schedule an appointment as soon as possible for a visit in 10 day(s).   Specialty:  Internal Medicine Contact information: [address] [place] [address] [phone]            The results of significant diagnostics from this hospitalization (including imaging, microbiology, ancillary and laboratory) are listed below for reference.    Significant Diagnostic Studies: Dg Lumbar Spine Complete  Result Date: 07/30/2017 CLINICAL DATA:  Fall. EXAM: LUMBAR SPINE - COMPLETE 4+ VIEW COMPARISON:  09/12/2016 FINDINGS: Lumbar scoliosis convex towards the left. Diffuse degenerative change throughout the lumbar spine with narrowed interspaces and endplate hypertrophic changes throughout. No anterior subluxations. No vertebral compression deformities. No focal bone lesion or bone destruction. Degenerative disc  disease at multiple levels. Vascular calcifications in the aorta. Visualized sacrum appears intact. IMPRESSION: Lumbar scoliosis and degenerative changes. No acute displaced fractures identified. Electronically Signed   By: Burman Nieves M.D.   On: 07/30/2017 03:26   Dg Tibia/fibula Left  Result Date: 07/30/2017 CLINICAL DATA:  Fall.  Purple area on the left lower leg. EXAM: LEFT TIBIA AND FIBULA - 2 VIEW COMPARISON:  None. FINDINGS: Diffuse bone demineralization. Degenerative changes in the knee with narrowed medial and lateral compartments and small osteophyte formation. Cartilage calcification. Tibia and fibula appear  intact. No evidence of acute fracture or dislocation. Bone cortex appears intact. Scattered calcifications in the soft tissues consistent with vascular calcifications and phleboliths. IMPRESSION: Degenerative changes in the left knee.  No acute bony abnormalities. Electronically Signed   By: Burman Nieves M.D.   On: 07/30/2017 03:23   Ct Head Wo Contrast  Result Date: 07/30/2017 CLINICAL DATA:  Fall with head trauma EXAM: CT HEAD WITHOUT CONTRAST TECHNIQUE: Contiguous axial images were obtained from the base of the skull through the vertex without intravenous contrast. COMPARISON:  None. FINDINGS: Brain: There is no mass, hemorrhage or extra-axial collection. The size and configuration of the ventricles and extra-axial CSF spaces are normal. There is no acute or chronic infarction. The brain parenchyma is normal. Vascular: No abnormal hyperdensity of the major intracranial arteries or dural venous sinuses. No intracranial atherosclerosis. Skull: Small anterior midline scalp hematoma.  No skull fracture. Sinuses/Orbits: No fluid levels or advanced mucosal thickening of the visualized paranasal sinuses. No mastoid or middle ear effusion. The orbits are normal. IMPRESSION: 1. Normal aging brain.  No acute intracranial abnormality. 2. Small anterior midline scalp hematoma without skull fracture. Electronically Signed   By: Deatra Robinson M.D.   On: 07/30/2017 02:57   Dg Chest Port 1 View  Result Date: 07/30/2017 CLINICAL DATA:  Elevated white blood cell count. Former smoker. History of hypertension, chronic CHF. EXAM: PORTABLE CHEST 1 VIEW COMPARISON:  PA and lateral chest x-ray of September 12, 2016 FINDINGS: The lungs are well-expanded. There is a trace of pleural fluid on the right. The interstitial markings are coarse. The lung markings are increased in the left suprahilar region. The heart is normal in size. The pulmonary vascularity is not engorged. There is calcification in the wall of the aortic arch. There  is mild chronic soft tissue fullness in the right paratracheal region superiorly. There are degenerative changes of both shoulders. IMPRESSION: Chronic bronchitic-smoking related changes with superimposed atelectasis or early pneumonia in the left upper lobe. No CHF. Followup PA and lateral chest X-ray is recommended in 3-4 weeks following trial of antibiotic therapy to ensure resolution and exclude underlying malignancy. Thoracic aortic atherosclerosis. Electronically Signed   By: David  [place] M.D.   On: 07/30/2017 09:53   Dg Knee Complete 4 Views Right  Result Date: 07/30/2017 CLINICAL DATA:  Fall. EXAM: RIGHT KNEE - COMPLETE 4+ VIEW COMPARISON:  None. FINDINGS: Degenerative changes in the right knee with narrowed medial compartment and mild cartilaginous calcification. No evidence of acute fracture or dislocation. No focal bone lesion or bone destruction. Bone cortex appears intact. No significant effusion. Vascular calcifications. IMPRESSION: Degenerative changes in the right knee. No acute bony abnormalities. Electronically Signed   By: Burman Nieves M.D.   On: 07/30/2017 03:27   Dg Hip Unilat W Or Wo Pelvis 2-3 Views Right  Result Date: 07/30/2017 CLINICAL DATA:  Fall. EXAM: DG HIP (WITH OR WITHOUT PELVIS) 2-3V RIGHT COMPARISON:  None.  FINDINGS: Postoperative right hip hemiarthroplasty with non cemented femoral component. Component appears well seated. No evidence of acute fracture or dislocation. Degenerative changes in the lower lumbar spine and in the left hip. Pelvis appears intact. No evidence of acute fracture or dislocation. Sacrum is mostly obscured by overlying bowel gas. IMPRESSION: Right hip hemiarthroplasty. No acute fracture or dislocation. Electronically Signed   By: Burman Nieves M.D.   On: 07/30/2017 03:25    Microbiology: No results found for this or any previous visit (from the past 240 hour(s)).   Labs: Basic Metabolic Panel: Recent Labs  Lab 07/30/17 0239  07/31/17 0418 07/31/17 0904 08/01/17 0408  NA 141 141 142 139  K 2.6* 6.1* 5.7* 4.4  CL 102 112* 111 111  CO2 GLUCOSE 99 83 83 87  BUN 23* 21* 20 15  CREATININE 1.38* 1.17* 1.14* 0.89  CALCIUM 9.2 8.5* 9.1 8.4*  MG 2.0  --   --   --    Liver Function Tests: Recent Labs  Lab 07/30/17 0239  AST 27  ALT 14  ALKPHOS 95  BILITOT 1.2  PROT 6.8  ALBUMIN 3.6   No results for input(s): LIPASE, AMYLASE in the last 168 hours. No results for input(s): AMMONIA in the last 168 hours. CBC: Recent Labs  Lab 07/30/17 0239 07/31/17 0418  WBC 18.5* 11.7*  NEUTROABS 14.2*  --   HGB 13.6 11.0*  HCT 41.2 33.5*  MCV 85.5 87.7  PLT 264 217   Cardiac Enzymes: Recent Labs  Lab 07/30/17 0239  CKTOTAL 286*   BNP: BNP (last 3 results) No results for input(s): BNP in the last 8760 hours.  ProBNP (last 3 results) No results for input(s): PROBNP in the last 8760 hours.  CBG: No results for input(s): GLUCAP in the last 168 hours.     Signed:  Vassie Loll MD.  [facility] 08/01/2017, 12:16 PM

## 2017-08-01 NOTE — Progress Notes (Signed)
Physical Therapy Treatment Patient Details Name: Lori Bishop MRN: [id number] DOB: 08-07-1920 Today's Date: 08/01/2017    History of Present Illness Lori Bishop is a 84 y.o. female with medical history significant for osteoarthritis, GERD, gout, hypertension, grade 1 chronic diastolic CHF, and spinal stenosis who presented to the emergency department earlier this morning after she fell and hit her head on the bedside commode while getting out of bed.  She typically ambulates with a walker and lives independently.  She states that her legs have been progressively weakening over the last several days and she could not support herself well, causing her fall.  She denies any loss of consciousness.  She is lying on the floor for about an hour before she was able to get through to [facility] and they had called her niece to check up on her who then brought her to the ED.  She also hit her left shin and has some bruising there.    PT Comments    Patient presents up in chair and demonstrates much improvement for functional mobility and gait vs at time of initial visit a couple of days ago, able to ambulate in hallway without loss of balance and limited mostly due to fatigue.  Patient instructed in and given written instructions for HEP with good return demonstrated and understanding acknowledged.  Patient will benefit from continued physical therapy in hospital and recommended venue below to increase strength, balance, endurance for safe ADLs and gait.    Follow Up Recommendations  Home health PT;Supervision/Assistance - 24 hour     Equipment Recommendations  None recommended by PT    Recommendations for Other Services       Precautions / Restrictions Precautions Precautions: Fall Restrictions Weight Bearing Restrictions: No    Mobility  Bed Mobility               General bed mobility comments: Patient presents seated in chair  Transfers Overall transfer level: Modified  independent                  Ambulation/Gait Ambulation/Gait assistance: Supervision Ambulation Distance (Feet): 65 Feet Assistive device: Rolling walker (2 wheeled) Gait Pattern/deviations: Decreased step length - right;Decreased step length - left;Decreased stride length Gait velocity: slow   General Gait Details: demonstrates slightly slower than normal cadence without loss of balance, limited mostly due to fatigue   Stairs             Wheelchair Mobility    Modified Rankin (Stroke Patients Only)       Balance Overall balance assessment: Needs assistance Sitting-balance support: No upper extremity supported;Feet unsupported Sitting balance-Leahy Scale: Good     Standing balance support: Bilateral upper extremity supported;During functional activity Standing balance-Leahy Scale: Fair Standing balance comment: using RW                            Cognition Arousal/Alertness: Awake/alert Behavior During Therapy: WFL for tasks assessed/performed Overall Cognitive Status: Within Functional Limits for tasks assessed                                        Exercises General Exercises - Lower Extremity Long Arc Quad: Seated;AROM;Strengthening;Both;10 reps Hip Flexion/Marching: Seated;AROM;Strengthening;Both;10 reps Toe Raises: Seated;AROM;Strengthening;Both;10 reps Heel Raises: Seated;AROM;Strengthening;Both;10 reps    General Comments        Pertinent Vitals/Pain  Pain Assessment: No/denies pain    Home Living                      Prior Function            PT Goals (current goals can now be found in the care plan section) Acute Rehab PT Goals Patient Stated Goal: return home with family, caregivers to assist PT Goal Formulation: With patient Time For Goal Achievement: 08/13/17 Potential to Achieve Goals: Good Progress towards PT goals: Progressing toward goals    Frequency    Min 3X/week      PT  Plan Current plan remains appropriate    Co-evaluation              AM-PAC PT "6 Clicks" Daily Activity  Outcome Measure  Difficulty turning over in bed (including adjusting bedclothes, sheets and blankets)?: None Difficulty moving from lying on back to sitting on the side of the bed? : None Difficulty sitting down on and standing up from a chair with arms (e.g., wheelchair, bedside commode, etc,.)?: None Help needed moving to and from a bed to chair (including a wheelchair)?: None Help needed walking in hospital room?: A Little Help needed climbing 3-5 steps with a railing? : A Little 6 Click Score: 22    End of Session   Activity Tolerance: Patient tolerated treatment well;Patient limited by fatigue Patient left: in chair;with call bell/phone within reach Nurse Communication: Mobility status PT Visit Diagnosis: Muscle weakness (generalized) (M62.81);History of falling (Z91.81)     Time: 1610-9604 PT Time Calculation (min) (ACUTE ONLY): 24 min  Charges:  $Therapeutic Activity: 23-37 mins                    G Codes:       3:25 PM, 14-Aug-2017 Ocie Bob, MPT Physical Therapist with [facility] [phone] office [phone] mobile phone

## 2017-08-01 NOTE — Care Management (Signed)
CM spoke with patient again. Niece present in the room. Pt declines HH referral at this time, saying she "knows what to do and can have [her] aid help her".

## 2017-08-06 DIAGNOSIS — E538 Deficiency of other specified B group vitamins: Secondary | ICD-10-CM | POA: Diagnosis not present

## 2017-08-06 DIAGNOSIS — E876 Hypokalemia: Secondary | ICD-10-CM | POA: Diagnosis not present

## 2017-08-06 DIAGNOSIS — I5032 Chronic diastolic (congestive) heart failure: Secondary | ICD-10-CM | POA: Diagnosis not present

## 2017-08-06 DIAGNOSIS — I10 Essential (primary) hypertension: Secondary | ICD-10-CM | POA: Diagnosis not present

## 2017-08-06 DIAGNOSIS — N182 Chronic kidney disease, stage 2 (mild): Secondary | ICD-10-CM | POA: Diagnosis not present

## 2017-08-06 DIAGNOSIS — E559 Vitamin D deficiency, unspecified: Secondary | ICD-10-CM | POA: Diagnosis not present

## 2017-08-06 DIAGNOSIS — S0003XD Contusion of scalp, subsequent encounter: Secondary | ICD-10-CM | POA: Diagnosis not present

## 2017-08-06 DIAGNOSIS — W19XXXD Unspecified fall, subsequent encounter: Secondary | ICD-10-CM | POA: Diagnosis not present

## 2017-08-08 ENCOUNTER — Encounter: Payer: Self-pay | Admitting: Orthopaedic Surgery

## 2017-08-08 ENCOUNTER — Ambulatory Visit: Payer: Medicare Other | Admitting: Orthopaedic Surgery

## 2017-08-08 DIAGNOSIS — M25562 Pain in left knee: Secondary | ICD-10-CM | POA: Diagnosis not present

## 2017-08-08 DIAGNOSIS — M25561 Pain in right knee: Secondary | ICD-10-CM | POA: Diagnosis not present

## 2017-08-08 DIAGNOSIS — G8929 Other chronic pain: Secondary | ICD-10-CM

## 2017-08-08 DIAGNOSIS — I10 Essential (primary) hypertension: Secondary | ICD-10-CM

## 2017-08-08 NOTE — Progress Notes (Signed)
CC: Both of my knees are hurting. I would like an injection in both knees.  The patient has had chronic pain and tenderness of both knees for some time.  Injections help.  There is no locking or giving way of the knee.  There is no new trauma. There is no redness or signs of infections.  The knees have a mild effusion and some crepitus.  There is no redness or signs of recent trauma.  Right knee ROM is 0-95 and left knee ROM is 0-90.  Impression:  Chronic pain of the both knees  Return:  1 month  PROCEDURE NOTE:  The patient requests injections of both knees, verbal consent was obtained.  The left and right knee were individually prepped appropriately after time out was performed.   Sterile technique was observed and injection of 1 cc of Depo-Medrol 40 mg with several cc's of plain xylocaine. Anesthesia was provided by ethyl chloride and a 20-gauge needle was used to inject each knee area. The injections were tolerated well.  A band aid dressing was applied.  The patient was advised to apply ice later today and tomorrow to the injection sight as needed.  She fell last week and hurt her face.  She has resolving hematomas and ecchymosis.  She did not hurt her knees.  Electronically Signed Darreld McleanWayne Niyam Bisping, MD 6/6/20199:19 AM

## 2017-08-12 DIAGNOSIS — M6281 Muscle weakness (generalized): Secondary | ICD-10-CM | POA: Diagnosis not present

## 2017-08-12 DIAGNOSIS — M5489 Other dorsalgia: Secondary | ICD-10-CM | POA: Diagnosis not present

## 2017-08-12 DIAGNOSIS — K219 Gastro-esophageal reflux disease without esophagitis: Secondary | ICD-10-CM | POA: Diagnosis not present

## 2017-08-12 DIAGNOSIS — I10 Essential (primary) hypertension: Secondary | ICD-10-CM | POA: Diagnosis not present

## 2017-08-12 DIAGNOSIS — N182 Chronic kidney disease, stage 2 (mild): Secondary | ICD-10-CM | POA: Diagnosis not present

## 2017-08-20 ENCOUNTER — Ambulatory Visit: Payer: Medicare Other | Admitting: Obstetrics & Gynecology

## 2017-08-20 ENCOUNTER — Encounter: Payer: Self-pay | Admitting: Obstetrics & Gynecology

## 2017-08-20 VITALS — BP 146/70 | HR 68 | Wt 121.0 lb

## 2017-08-20 DIAGNOSIS — Z4689 Encounter for fitting and adjustment of other specified devices: Secondary | ICD-10-CM | POA: Diagnosis not present

## 2017-08-20 DIAGNOSIS — N812 Incomplete uterovaginal prolapse: Secondary | ICD-10-CM

## 2017-08-20 NOTE — Progress Notes (Signed)
Chief Complaint  Patient presents with  . follow-up 1 month    Blood pressure (!) 146/70, pulse 68, weight 121 lb (54.9 kg).  Lori Bishop presents today for routine follow up related to her pessary.   She uses a Milex ring with support #4, however it came out a few days prior She reports no vaginal discharge or vaginal bleeding.  Exam reveals no undue vaginal mucosal pressure of breakdown, no discharge and no vaginal bleeding.  The vagina is short but wide which makes a perfect fit very difficult A #5 is too deep so I fit her for a 3 which is good length wise but a bit short on width The Milex ring with support #3 is placed    Lori Bishop will be sen back in 1 months for continued follow up.  Lori ArmsLuther H Marylu Dudenhoeffer, MD  08/20/2017 11:06 AM

## 2017-09-03 ENCOUNTER — Ambulatory Visit: Payer: Medicare Other | Admitting: Orthopaedic Surgery

## 2017-09-09 ENCOUNTER — Ambulatory Visit: Payer: Medicare Other | Admitting: Obstetrics & Gynecology

## 2017-09-09 ENCOUNTER — Encounter: Payer: Self-pay | Admitting: Obstetrics & Gynecology

## 2017-09-09 VITALS — BP 127/65 | HR 85 | Wt 121.0 lb

## 2017-09-09 DIAGNOSIS — N812 Incomplete uterovaginal prolapse: Secondary | ICD-10-CM | POA: Diagnosis not present

## 2017-09-09 DIAGNOSIS — Z4689 Encounter for fitting and adjustment of other specified devices: Secondary | ICD-10-CM

## 2017-09-09 NOTE — Progress Notes (Signed)
Chief Complaint  Patient presents with  . Follow-up    Blood pressure 127/65, pulse 85, weight 121 lb (54.9 kg).  Lori GravelAretta A Bennette presents today for routine follow up related to her pessary.   She uses a Milex ring with support #4 but it came out She is a difficult fit because ideally she would use the width of a #4 but her tissue laterally would require a #3 She reports no vaginal discharge or vaginal bleeding.  Exam reveals no undue vaginal mucosal pressure of breakdown, no discharge and no vaginal bleeding.  The Milex ring with support #3 pessary is placed.  I am hopeful less length will be ok for staying in, it is a better side to side fit  [name] will be sen back in 1 months for continued follow up.   Lazaro ArmsLuther H Malyah Ohlrich, MD 09/09/2017 9:38 AM

## 2017-09-16 ENCOUNTER — Ambulatory Visit (INDEPENDENT_AMBULATORY_CARE_PROVIDER_SITE_OTHER): Payer: Medicare Other | Admitting: Otolaryngology

## 2017-09-16 DIAGNOSIS — H903 Sensorineural hearing loss, bilateral: Secondary | ICD-10-CM | POA: Diagnosis not present

## 2017-09-16 DIAGNOSIS — H6123 Impacted cerumen, bilateral: Secondary | ICD-10-CM | POA: Diagnosis not present

## 2017-09-19 ENCOUNTER — Ambulatory Visit: Payer: Medicare Other | Admitting: Obstetrics & Gynecology

## 2017-10-03 DIAGNOSIS — R739 Hyperglycemia, unspecified: Secondary | ICD-10-CM | POA: Diagnosis not present

## 2017-10-03 DIAGNOSIS — Z1389 Encounter for screening for other disorder: Secondary | ICD-10-CM | POA: Diagnosis not present

## 2017-10-03 DIAGNOSIS — R600 Localized edema: Secondary | ICD-10-CM | POA: Diagnosis not present

## 2017-10-03 DIAGNOSIS — M1A00X0 Idiopathic chronic gout, unspecified site, without tophus (tophi): Secondary | ICD-10-CM | POA: Diagnosis not present

## 2017-10-03 DIAGNOSIS — Z79899 Other long term (current) drug therapy: Secondary | ICD-10-CM | POA: Diagnosis not present

## 2017-10-03 DIAGNOSIS — I5032 Chronic diastolic (congestive) heart failure: Secondary | ICD-10-CM | POA: Diagnosis not present

## 2017-10-03 DIAGNOSIS — M6281 Muscle weakness (generalized): Secondary | ICD-10-CM | POA: Diagnosis not present

## 2017-10-03 DIAGNOSIS — E538 Deficiency of other specified B group vitamins: Secondary | ICD-10-CM | POA: Diagnosis not present

## 2017-10-03 DIAGNOSIS — E559 Vitamin D deficiency, unspecified: Secondary | ICD-10-CM | POA: Diagnosis not present

## 2017-10-03 DIAGNOSIS — R7989 Other specified abnormal findings of blood chemistry: Secondary | ICD-10-CM | POA: Diagnosis not present

## 2017-10-03 DIAGNOSIS — Z0001 Encounter for general adult medical examination with abnormal findings: Secondary | ICD-10-CM | POA: Diagnosis not present

## 2017-10-03 DIAGNOSIS — I10 Essential (primary) hypertension: Secondary | ICD-10-CM | POA: Diagnosis not present

## 2017-10-10 ENCOUNTER — Ambulatory Visit: Payer: Medicare Other | Admitting: Obstetrics & Gynecology

## 2017-10-17 ENCOUNTER — Ambulatory Visit: Payer: Medicare Other | Admitting: Obstetrics & Gynecology

## 2017-10-22 ENCOUNTER — Encounter (INDEPENDENT_AMBULATORY_CARE_PROVIDER_SITE_OTHER): Payer: Self-pay

## 2017-10-22 ENCOUNTER — Ambulatory Visit: Payer: Medicare Other | Admitting: Obstetrics & Gynecology

## 2017-10-22 ENCOUNTER — Encounter: Payer: Self-pay | Admitting: Obstetrics & Gynecology

## 2017-10-22 VITALS — BP 176/79 | HR 99 | Ht 60.0 in | Wt 126.0 lb

## 2017-10-22 DIAGNOSIS — Z4689 Encounter for fitting and adjustment of other specified devices: Secondary | ICD-10-CM | POA: Diagnosis not present

## 2017-10-22 DIAGNOSIS — N812 Incomplete uterovaginal prolapse: Secondary | ICD-10-CM

## 2017-10-22 NOTE — Progress Notes (Signed)
Chief Complaint  Patient presents with  . Pessary Check    clean and check    Blood pressure (!) 176/79, pulse 99, height 5' (1.524 m), weight 126 lb (57.2 kg).  Lori Bishop presents today for routine follow up related to her pessary.   She uses a milex ring with support #3 She reports no vaginal discharge or vaginal bleeding.  Exam reveals no undue vaginal mucosal pressure of breakdown, no discharge and no vaginal bleeding.  The pessary is removed, cleaned and replaced without difficulty.    Lori Bishop will be sen back in 4 months for continued follow up.  Lazaro ArmsLuther H Eure, MD  10/22/2017 9:51 AM

## 2018-01-03 DIAGNOSIS — M1A00X0 Idiopathic chronic gout, unspecified site, without tophus (tophi): Secondary | ICD-10-CM | POA: Diagnosis not present

## 2018-01-03 DIAGNOSIS — I5032 Chronic diastolic (congestive) heart failure: Secondary | ICD-10-CM | POA: Diagnosis not present

## 2018-01-03 DIAGNOSIS — K219 Gastro-esophageal reflux disease without esophagitis: Secondary | ICD-10-CM | POA: Diagnosis not present

## 2018-01-03 DIAGNOSIS — I10 Essential (primary) hypertension: Secondary | ICD-10-CM | POA: Diagnosis not present

## 2018-01-03 DIAGNOSIS — N182 Chronic kidney disease, stage 2 (mild): Secondary | ICD-10-CM | POA: Diagnosis not present

## 2018-02-21 ENCOUNTER — Encounter: Payer: Self-pay | Admitting: Obstetrics & Gynecology

## 2018-02-21 ENCOUNTER — Ambulatory Visit: Payer: Medicare Other | Admitting: Obstetrics & Gynecology

## 2018-02-21 VITALS — BP 154/74 | HR 76 | Ht 60.0 in | Wt 116.0 lb

## 2018-02-21 DIAGNOSIS — N812 Incomplete uterovaginal prolapse: Secondary | ICD-10-CM

## 2018-02-21 DIAGNOSIS — Z4689 Encounter for fitting and adjustment of other specified devices: Secondary | ICD-10-CM

## 2018-02-21 NOTE — Progress Notes (Signed)
Chief Complaint  Patient presents with  . Pessary Check    Blood pressure (!) 154/74, pulse 76, height 5' (1.524 m), weight 116 lb (52.6 kg).  Lori Bishop presents today for routine follow up related to her pessary.   She uses a Milex ring with support #4 She reports no vaginal discharge or vaginal bleeding.  Exam reveals no undue vaginal mucosal pressure of breakdown, no discharge and no vaginal bleeding.  The pessary is removed, cleaned and replaced without difficulty.    Lori GravelAretta A Garland will be sen back in 4 months for continued follow up.  Lazaro ArmsLuther H Kendal Raffo, MD  02/21/2018 9:57 AM

## 2018-03-24 ENCOUNTER — Ambulatory Visit (INDEPENDENT_AMBULATORY_CARE_PROVIDER_SITE_OTHER): Payer: Medicare Other | Admitting: Otolaryngology

## 2018-03-24 DIAGNOSIS — H6123 Impacted cerumen, bilateral: Secondary | ICD-10-CM

## 2018-04-07 DIAGNOSIS — I10 Essential (primary) hypertension: Secondary | ICD-10-CM | POA: Diagnosis not present

## 2018-04-07 DIAGNOSIS — I5032 Chronic diastolic (congestive) heart failure: Secondary | ICD-10-CM | POA: Diagnosis not present

## 2018-04-07 DIAGNOSIS — K219 Gastro-esophageal reflux disease without esophagitis: Secondary | ICD-10-CM | POA: Diagnosis not present

## 2018-04-07 DIAGNOSIS — M1A00X0 Idiopathic chronic gout, unspecified site, without tophus (tophi): Secondary | ICD-10-CM | POA: Diagnosis not present

## 2018-06-06 ENCOUNTER — Telehealth: Payer: Self-pay | Admitting: Obstetrics & Gynecology

## 2018-06-06 NOTE — Telephone Encounter (Signed)
Left patients caregiver a message to call back to r/s appt to an earlier date.

## 2018-06-06 NOTE — Telephone Encounter (Signed)
ok 

## 2018-06-06 NOTE — Telephone Encounter (Signed)
Judi Cong, pts caregiver calling to see if this patient should come in sooner that the 21st because she is having some discharge from her pessory problems. Please advise.

## 2018-06-09 ENCOUNTER — Telehealth: Payer: Self-pay | Admitting: *Deleted

## 2018-06-09 NOTE — Telephone Encounter (Signed)
Patient informed that we are not allowing visitors or children to come to appointments at this time. Patient denies any contact with anyone suspected or confirmed of having COVID-19. Pt denies fever, cough, sob, muscle pain, diarrhea, rash, vomiting, abdominal pain, red eye, weakness, bruising or bleeding, joint pain or severe headache.  

## 2018-06-10 ENCOUNTER — Encounter: Payer: Self-pay | Admitting: Obstetrics & Gynecology

## 2018-06-10 ENCOUNTER — Other Ambulatory Visit: Payer: Self-pay

## 2018-06-10 ENCOUNTER — Ambulatory Visit: Payer: Medicare Other | Admitting: Obstetrics & Gynecology

## 2018-06-10 VITALS — BP 132/76 | HR 87 | Temp 98.3°F | Ht 60.0 in | Wt 114.0 lb

## 2018-06-10 DIAGNOSIS — B9689 Other specified bacterial agents as the cause of diseases classified elsewhere: Secondary | ICD-10-CM

## 2018-06-10 DIAGNOSIS — Z4689 Encounter for fitting and adjustment of other specified devices: Secondary | ICD-10-CM | POA: Diagnosis not present

## 2018-06-10 DIAGNOSIS — N760 Acute vaginitis: Secondary | ICD-10-CM

## 2018-06-10 MED ORDER — METRONIDAZOLE 0.75 % VA GEL: VAGINAL | 0 refills | Status: DC

## 2018-06-10 NOTE — Progress Notes (Signed)
Chief Complaint  Patient presents with  . odor coming from pessary    Blood pressure 132/76, pulse 87, temperature 98.3 F (36.8 C), height 5' (1.524 m), weight 114 lb (51.7 kg).  Lori Bishop presents today for routine follow up related to her pessary.   She uses a Milex ring with support #4 She has been having a malodorous discharge and for right now wants to just leave it out She reports moderate vaginal discharge and no vaginal bleeding.  Exam reveals no undue vaginal mucosal pressure of breakdown, little discharge and no vaginal bleeding.  The pessary is removed, cleaned and given to patient in a specimen bag to keep in case she wants it replaced in the future   [name] will be sen back in prn months for continued follow up.  Meds ordered this encounter  Medications  . metroNIDAZOLE (METROGEL VAGINAL) 0.75 % vaginal gel    Sig: Nightly x 5 nights    Dispense:  70 g    Refill:  0     [name], MD  06/10/2018 10:32 AM

## 2018-06-12 DIAGNOSIS — N182 Chronic kidney disease, stage 2 (mild): Secondary | ICD-10-CM | POA: Diagnosis not present

## 2018-06-12 DIAGNOSIS — I5032 Chronic diastolic (congestive) heart failure: Secondary | ICD-10-CM | POA: Diagnosis not present

## 2018-06-12 DIAGNOSIS — M1A00X0 Idiopathic chronic gout, unspecified site, without tophus (tophi): Secondary | ICD-10-CM | POA: Diagnosis not present

## 2018-06-12 DIAGNOSIS — K219 Gastro-esophageal reflux disease without esophagitis: Secondary | ICD-10-CM | POA: Diagnosis not present

## 2018-06-23 ENCOUNTER — Ambulatory Visit: Payer: Self-pay | Admitting: Obstetrics & Gynecology

## 2018-06-24 ENCOUNTER — Ambulatory Visit: Payer: Medicare Other | Admitting: Obstetrics & Gynecology

## 2018-06-29 ENCOUNTER — Encounter: Payer: Self-pay | Admitting: Orthopedic Surgery

## 2018-06-29 ENCOUNTER — Encounter: Payer: Self-pay | Admitting: Orthopaedic Surgery

## 2018-06-29 ENCOUNTER — Encounter: Payer: Self-pay | Admitting: Obstetrics & Gynecology

## 2018-07-07 ENCOUNTER — Telehealth: Payer: Self-pay | Admitting: Obstetrics & Gynecology

## 2018-07-07 MED ORDER — CLINDAMYCIN PHOSPHATE 1 % EX GEL: Freq: Every day | CUTANEOUS | 0 refills | Status: AC

## 2018-07-07 NOTE — Telephone Encounter (Signed)
When you call for Lori Bishop please call this number she has left a message earlier but she goes back and forth between houses

## 2018-07-07 NOTE — Addendum Note (Signed)
Addended by: Sherre Lain A on: 07/07/2018 01:43 PM   Modules accepted: Orders

## 2018-07-07 NOTE — Telephone Encounter (Signed)
Spoke with caregiver patient continues to have a vaginal discharge. I spoke with Dr. Alysia Penna about patient and he prescribed clindamycin gel vaginally for 7 days. Caregiver informed to pick rx up at [facility]. If not better to call for appt.

## 2018-07-07 NOTE — Telephone Encounter (Signed)
Patient's caretaker called stating that Lori Bishop is having issues. Please contact pt

## 2018-07-09 ENCOUNTER — Telehealth: Payer: Self-pay | Admitting: Obstetrics & Gynecology

## 2018-07-09 NOTE — Telephone Encounter (Signed)
Pt's caregiver states pt still has discharge. Used Metrogel x 5 nights. Was prescribed Clindamycin gel. Uterus has fallen again. Spoke with JAG. Pt was advised to gently push uterus back inside and place Clindamycin gel inside vagina. Pt and caregiver voiced understanding. Pt wants appt to have pessary placed. Call transferred to front desk for appt. JSY

## 2018-07-09 NOTE — Telephone Encounter (Signed)
Pts caregiver states that the pt is stating that her uterus has prolapsed again. Please advise.

## 2018-07-16 IMAGING — DX DG LUMBAR SPINE COMPLETE 4+V
5 series · 5 of 5 positions shown · non-contrast
Comparison: 09/12/2016

CLINICAL DATA: Fall.

EXAM:
LUMBAR SPINE - COMPLETE 4+ VIEW

[l-spine ap]
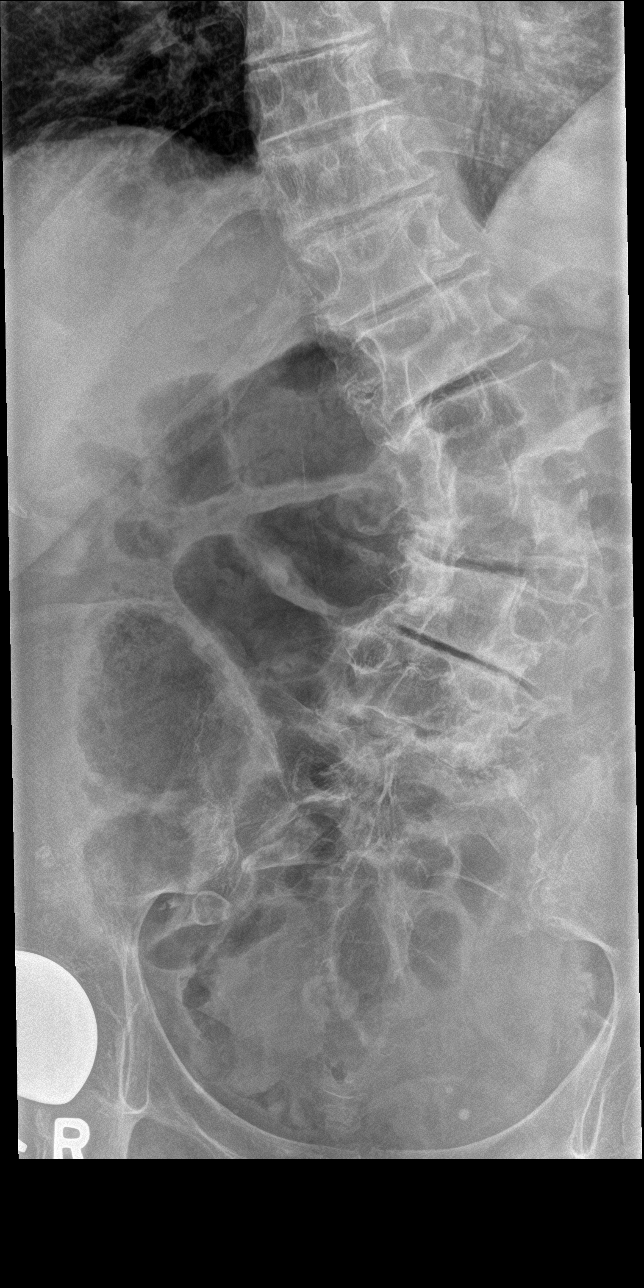

[l-spine obl (1 of 2)]
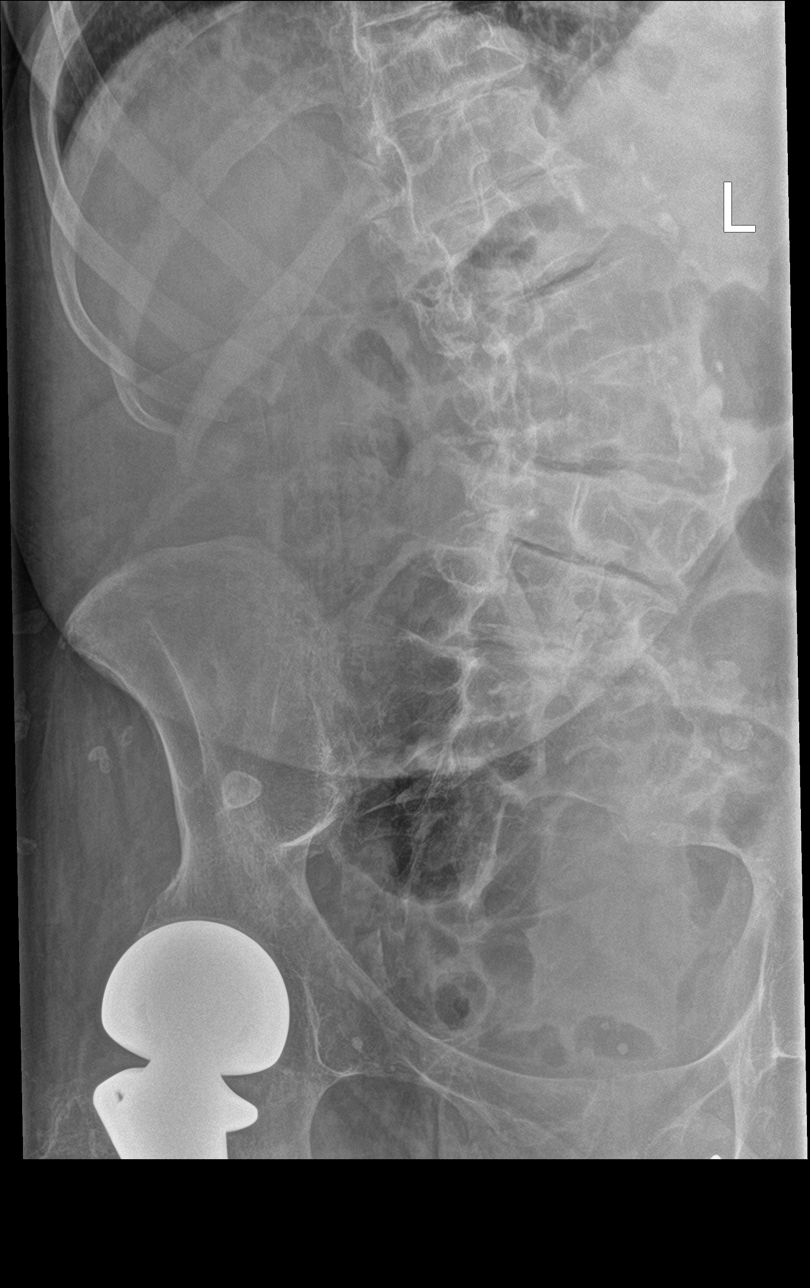

[l-spine obl (2 of 2)]
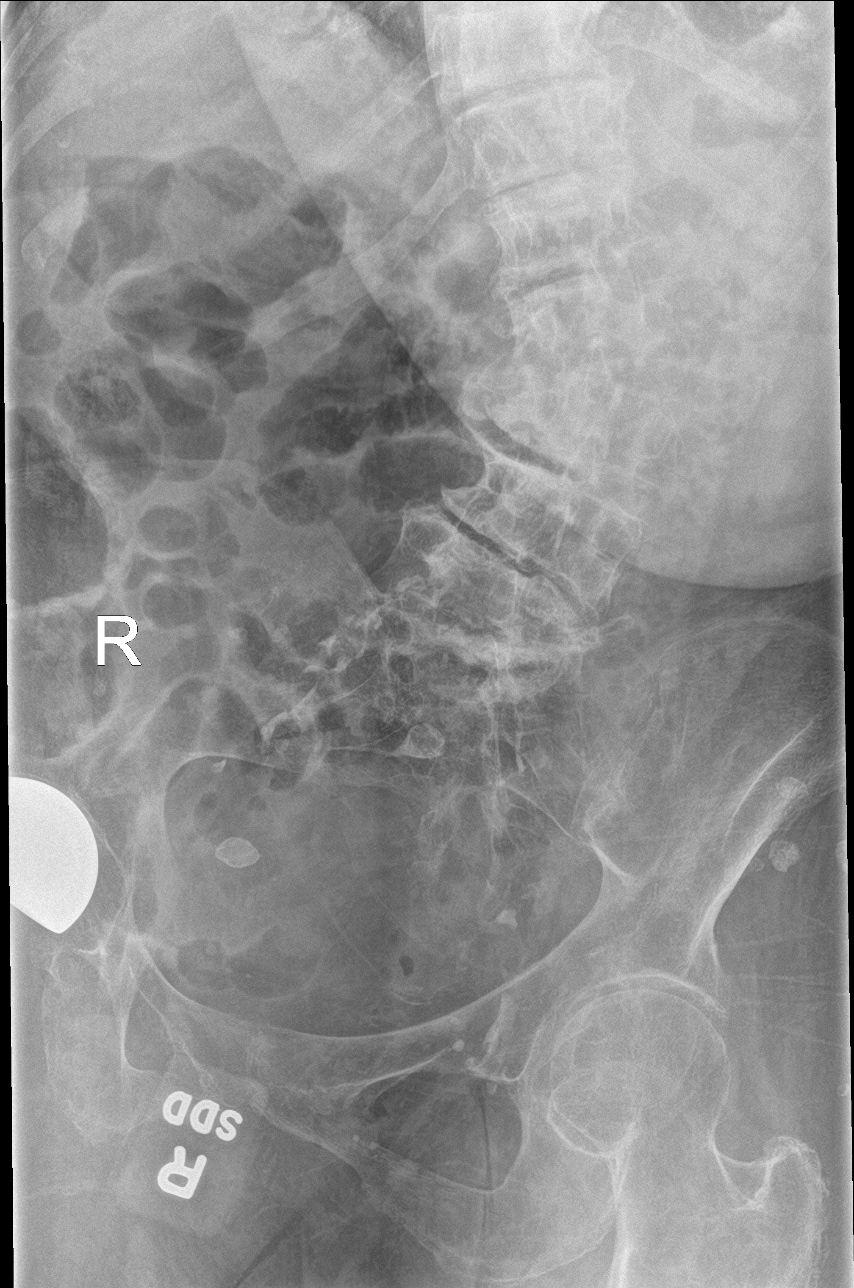

[l-spine lat]
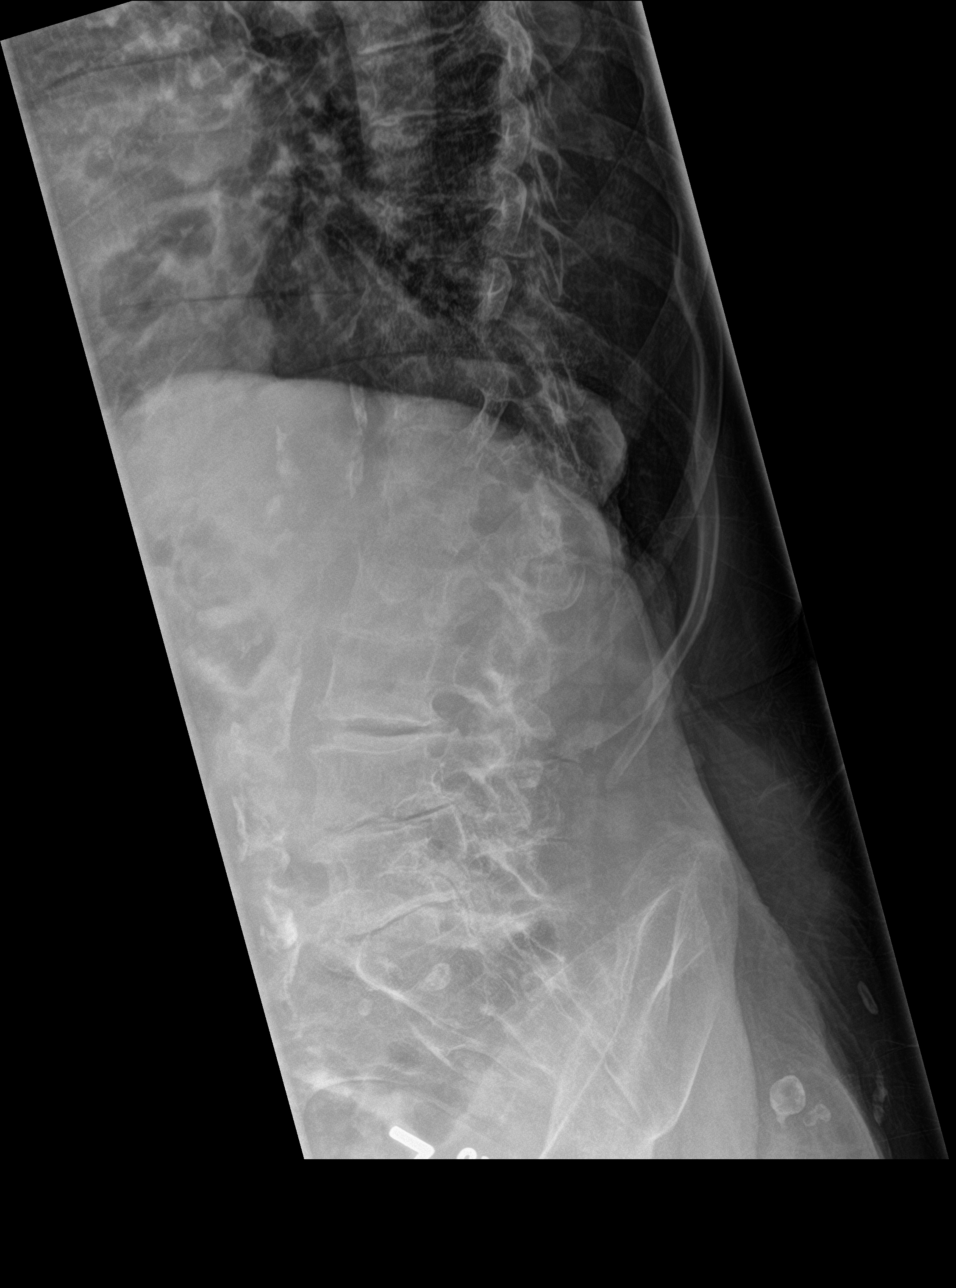

[l-spine spot]
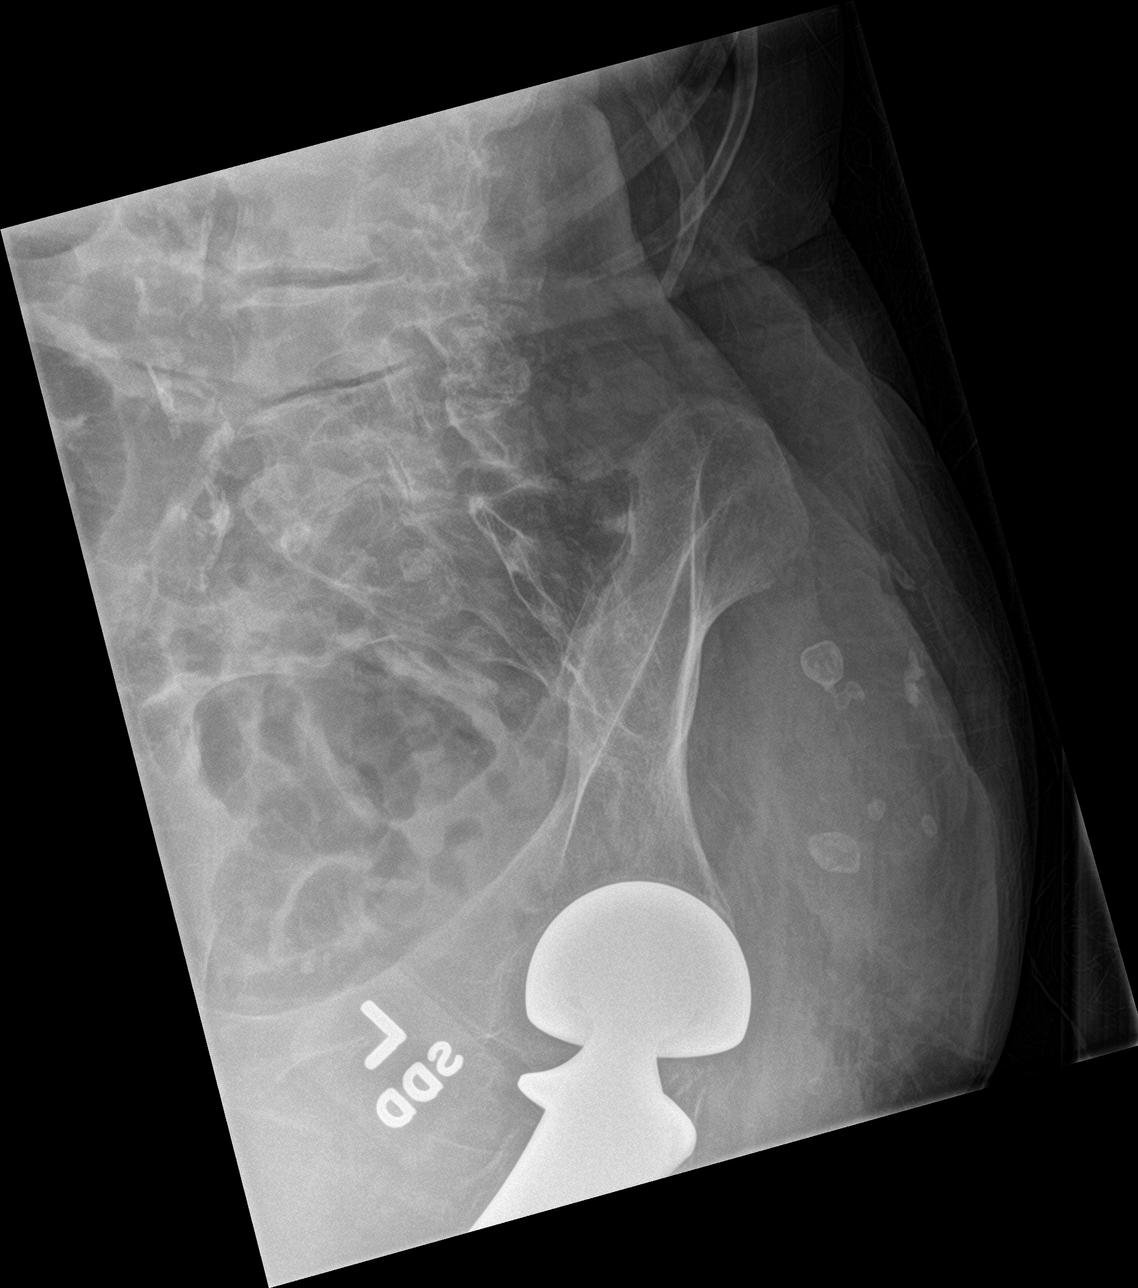

[5 of 5 positions shown; findings below may reference images not displayed]

FINDINGS: Lumbar scoliosis convex towards the left. Diffuse degenerative
change throughout the lumbar spine with narrowed interspaces and
endplate hypertrophic changes throughout. No anterior subluxations.
No vertebral compression deformities. No focal bone lesion or bone
destruction. Degenerative disc disease at multiple levels. Vascular
calcifications in the aorta. Visualized sacrum appears intact.
IMPRESSION: Lumbar scoliosis and degenerative changes. No acute displaced
fractures identified.

## 2018-07-16 IMAGING — DX DG HIP (WITH OR WITHOUT PELVIS) 2-3V*R*
3 series · 3 of 3 positions shown · non-contrast
Comparison: None.

CLINICAL DATA: Fall.

EXAM:
DG HIP (WITH OR WITHOUT PELVIS) 2-3V RIGHT

[hip ap]
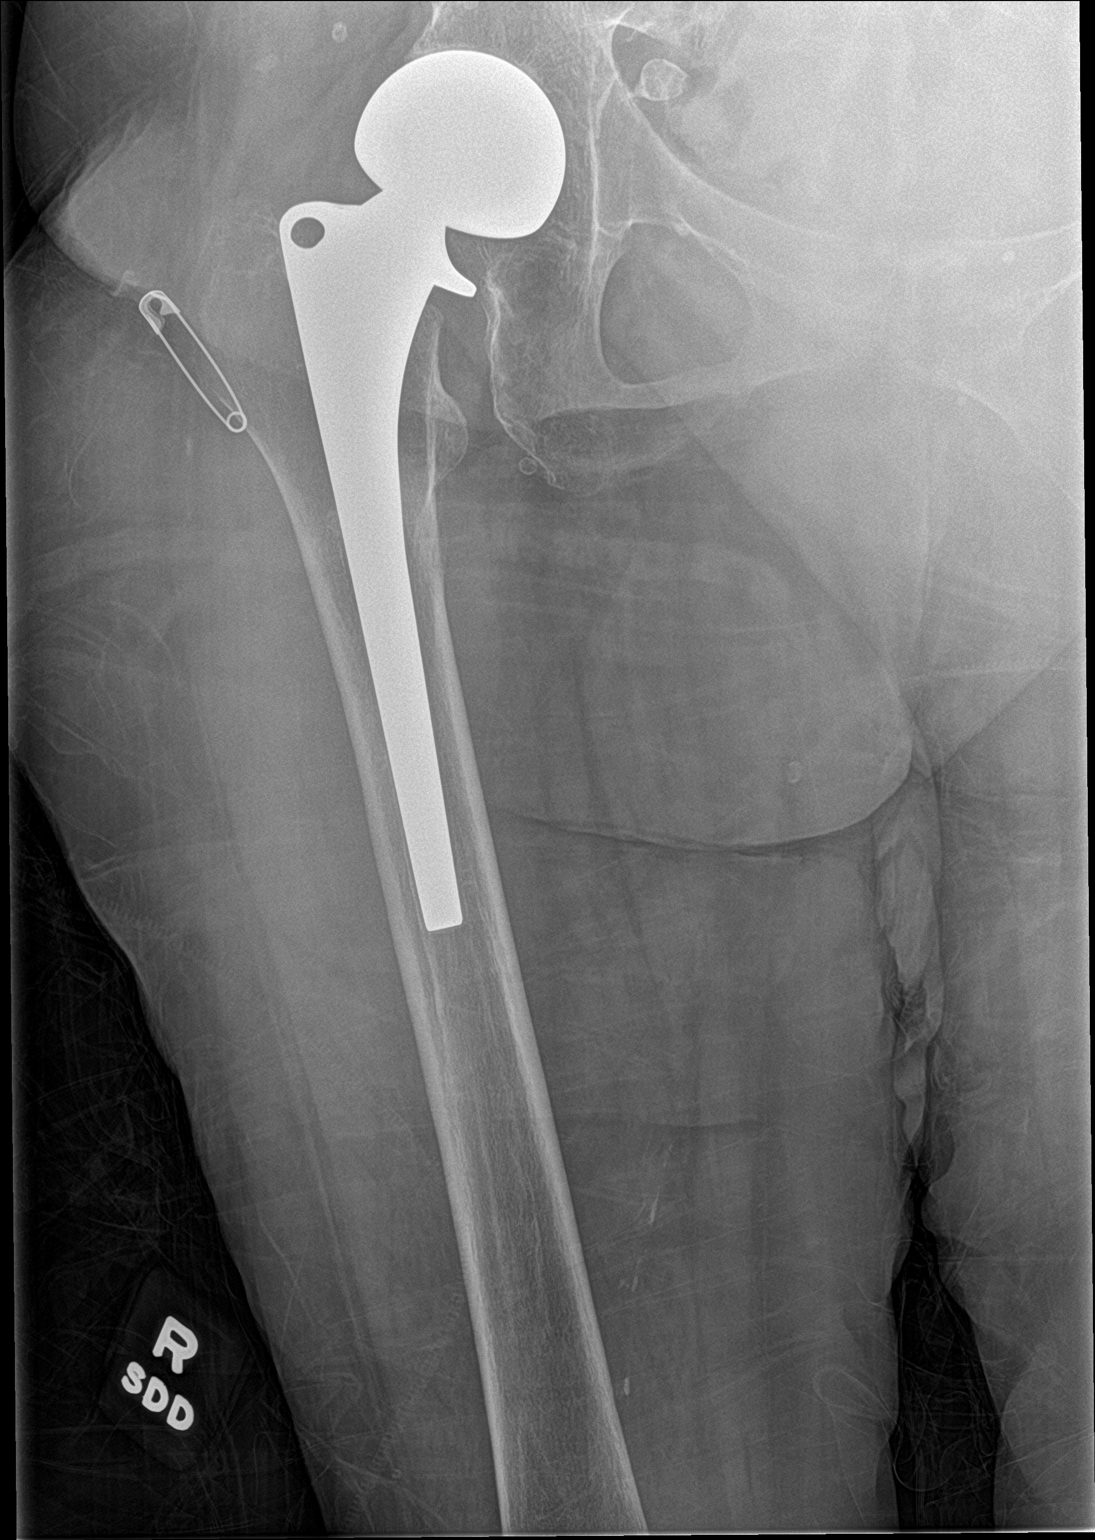

[hip lat]
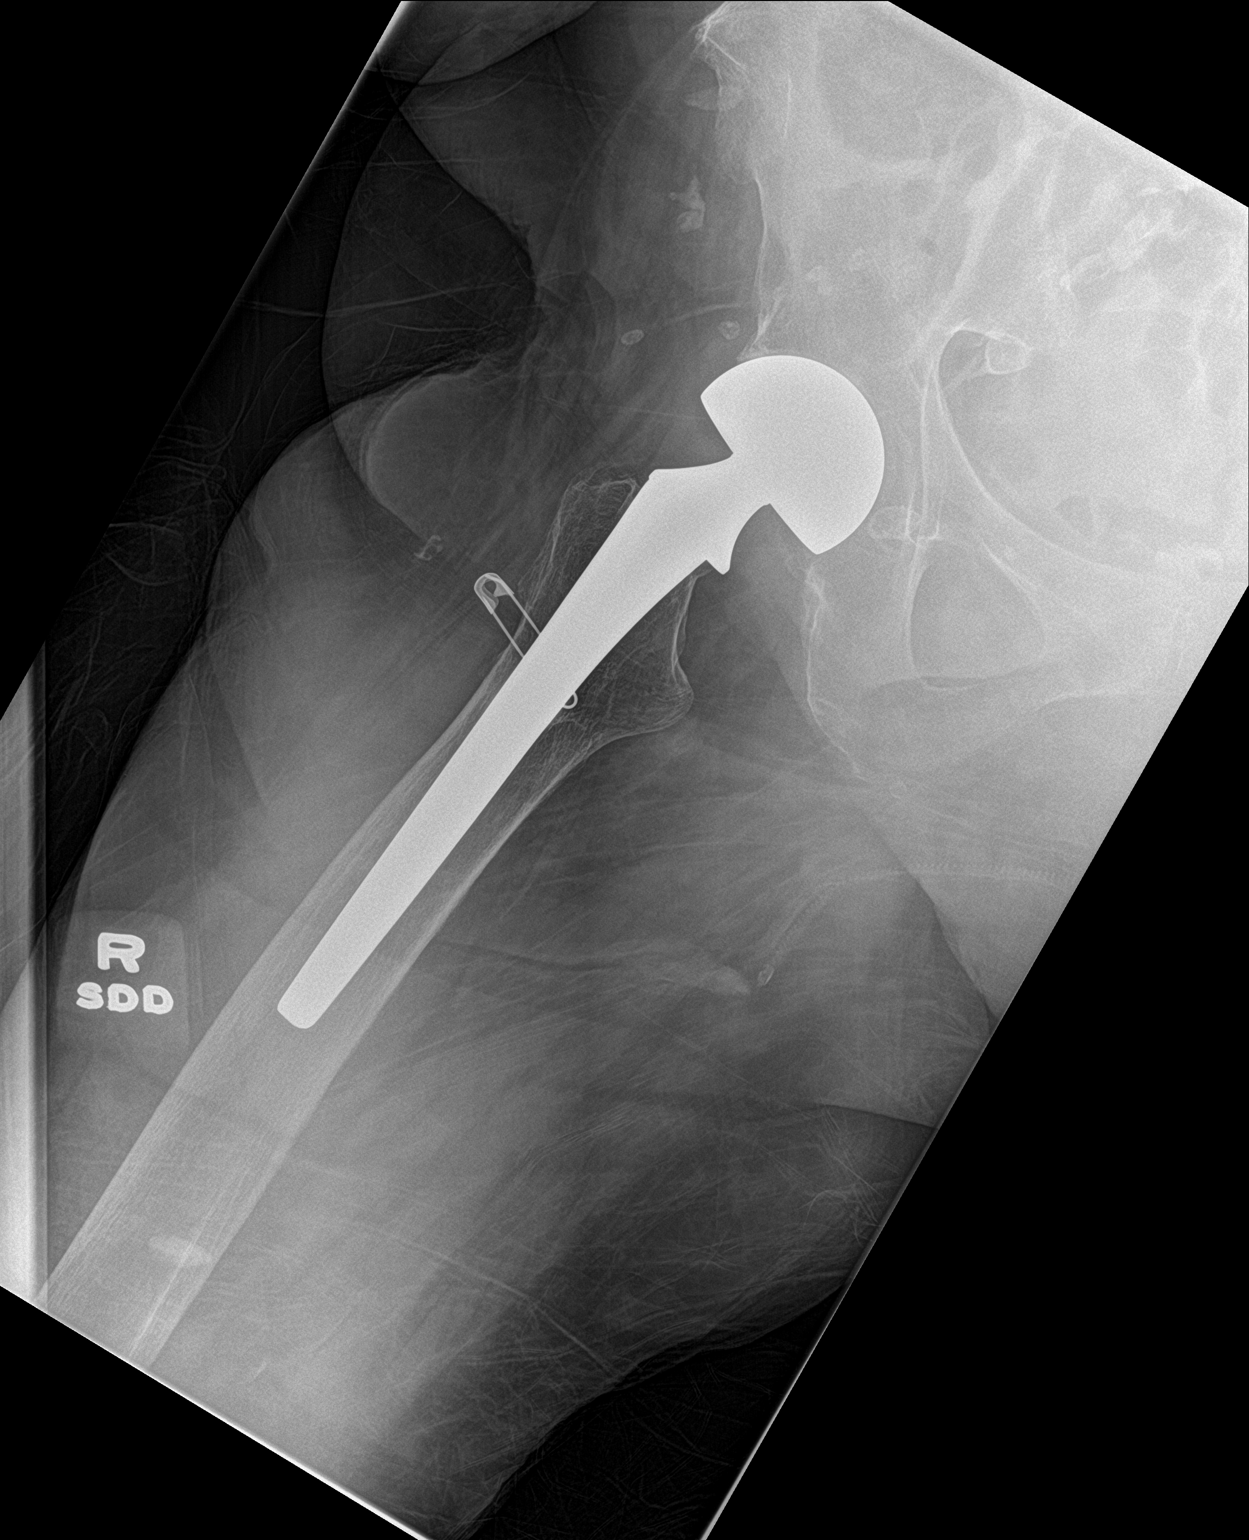

[pelvis ap]
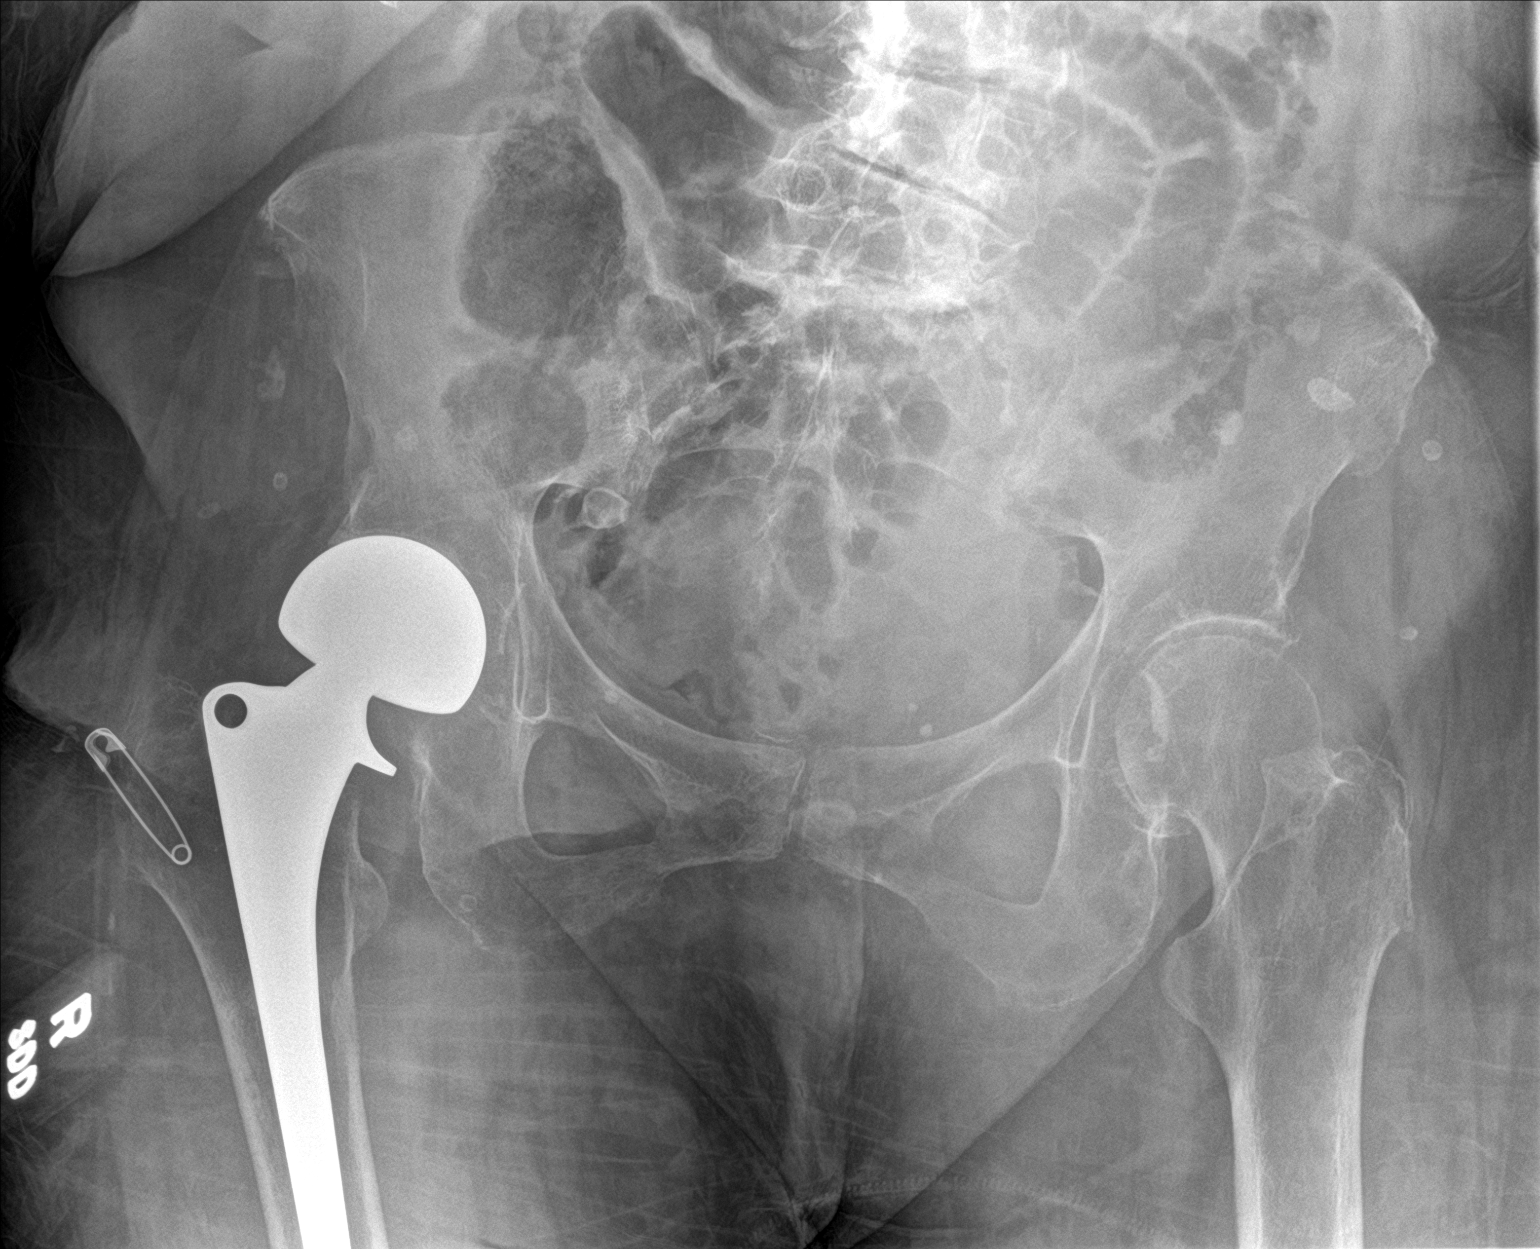

[3 of 3 positions shown; findings below may reference images not displayed]

FINDINGS: Postoperative right hip hemiarthroplasty with non cemented femoral
component. Component appears well seated. No evidence of acute
fracture or dislocation. Degenerative changes in the lower lumbar
spine and in the left hip. Pelvis appears intact. No evidence of
acute fracture or dislocation. Sacrum is mostly obscured by
overlying bowel gas.
IMPRESSION: Right hip hemiarthroplasty. No acute fracture or dislocation.

## 2018-07-18 ENCOUNTER — Ambulatory Visit: Payer: Medicare Other | Admitting: Obstetrics & Gynecology

## 2018-07-18 ENCOUNTER — Other Ambulatory Visit: Payer: Self-pay

## 2018-07-18 ENCOUNTER — Encounter: Payer: Self-pay | Admitting: Obstetrics & Gynecology

## 2018-07-18 VITALS — BP 138/82 | Wt 116.0 lb

## 2018-07-18 DIAGNOSIS — N812 Incomplete uterovaginal prolapse: Secondary | ICD-10-CM | POA: Diagnosis not present

## 2018-07-18 DIAGNOSIS — Z4689 Encounter for fitting and adjustment of other specified devices: Secondary | ICD-10-CM | POA: Diagnosis not present

## 2018-07-18 MED ORDER — METRONIDAZOLE 0.75 % VA GEL: VAGINAL | 3 refills | Status: DC

## 2018-07-18 NOTE — Progress Notes (Signed)
Chief Complaint  Patient presents with  . Pessary Check    Blood pressure 138/82, weight 116 lb (52.6 kg).  Lori Bishop presents today for routine follow up related to her pessary.   She uses a milex ring with support #4 By her choice she had me take it out last visit but now wants it replaced She brought it with her today She reports no vaginal discharge or vaginal bleeding.  Exam reveals no undue vaginal mucosal pressure of breakdown, no discharge and no vaginal bleeding.  The pessary is removed, cleaned and replaced without difficulty.    Lori Bishop will be sen back in 3 months for continued follow up.  [name], MD  07/18/2018 11:50 AM

## 2018-07-25 ENCOUNTER — Other Ambulatory Visit: Payer: Self-pay

## 2018-07-25 ENCOUNTER — Ambulatory Visit (INDEPENDENT_AMBULATORY_CARE_PROVIDER_SITE_OTHER): Payer: Medicare Other | Admitting: Obstetrics & Gynecology

## 2018-07-25 ENCOUNTER — Encounter: Payer: Self-pay | Admitting: Obstetrics & Gynecology

## 2018-07-25 VITALS — BP 159/77 | HR 75 | Ht 60.0 in | Wt 115.0 lb

## 2018-07-25 DIAGNOSIS — Z4689 Encounter for fitting and adjustment of other specified devices: Secondary | ICD-10-CM | POA: Diagnosis not present

## 2018-08-28 NOTE — Progress Notes (Signed)
Chief Complaint  Patient presents with  . "Pessary is hanging out"    Blood pressure (!) 159/77, pulse 75, height 5' (1.524 m), weight 115 lb (52.2 kg).  Lori Bishop presents today for routine follow up related to her pessary.   She uses a milex ring with support #4 She reports no vaginal discharge or vaginal bleeding. She stated it was hanging out but it is perfectly in place   Exam reveals no undue vaginal mucosal pressure of breakdown, no discharge and no vaginal bleeding.  The pessary is removed, cleaned and replaced without difficulty.    Lori Bishop will be sen back in 4 months for continued follow up.  Florian Buff, MD  08/28/2018 12:34 AM

## 2018-09-04 DIAGNOSIS — E559 Vitamin D deficiency, unspecified: Secondary | ICD-10-CM | POA: Diagnosis not present

## 2018-09-04 DIAGNOSIS — I10 Essential (primary) hypertension: Secondary | ICD-10-CM | POA: Diagnosis not present

## 2018-09-04 DIAGNOSIS — K219 Gastro-esophageal reflux disease without esophagitis: Secondary | ICD-10-CM | POA: Diagnosis not present

## 2018-09-04 DIAGNOSIS — I5032 Chronic diastolic (congestive) heart failure: Secondary | ICD-10-CM | POA: Diagnosis not present

## 2018-09-25 ENCOUNTER — Ambulatory Visit (INDEPENDENT_AMBULATORY_CARE_PROVIDER_SITE_OTHER): Payer: Medicare Other | Admitting: Otolaryngology

## 2018-09-29 ENCOUNTER — Other Ambulatory Visit: Payer: Self-pay

## 2018-09-29 ENCOUNTER — Ambulatory Visit (INDEPENDENT_AMBULATORY_CARE_PROVIDER_SITE_OTHER): Payer: Medicare Other | Admitting: Otolaryngology

## 2018-09-29 DIAGNOSIS — H903 Sensorineural hearing loss, bilateral: Secondary | ICD-10-CM

## 2018-09-29 DIAGNOSIS — H6123 Impacted cerumen, bilateral: Secondary | ICD-10-CM | POA: Diagnosis not present

## 2018-10-17 ENCOUNTER — Telehealth: Payer: Self-pay | Admitting: Obstetrics & Gynecology

## 2018-10-17 NOTE — Telephone Encounter (Signed)
Patient's caregiver informed we are still not allowing any visitors or children to come in during appointment time unless physical assistance is needed. Asked if has had any exposure to anyone suspected or confirmed of having COVID-19 or if she was experiencing any of the following, to reschedule: fever, cough, shortness of breath, muscle pain, diarrhea, rash, vomiting, abdominal pain, red eye, weakness, bruising, bleeding, joint pain, or a severe headache.  Stated no to all.  Asked that she complete E-check-in via mychart prior to arrival.  Advised to check-in via Hello [facility] and either call our office on arrival in our office parking lot to complete registration.  Advised to also use the provided hand sanitizer when entering the office and to wear a mask if she has one, if not, we will provide one. Pt verbalized understanding.

## 2018-10-20 ENCOUNTER — Encounter: Payer: Self-pay | Admitting: Obstetrics & Gynecology

## 2018-10-20 ENCOUNTER — Ambulatory Visit: Payer: Medicare Other | Admitting: Obstetrics & Gynecology

## 2018-10-20 ENCOUNTER — Other Ambulatory Visit: Payer: Self-pay

## 2018-10-20 VITALS — BP 138/70 | HR 93 | Ht 60.0 in | Wt 114.0 lb

## 2018-10-20 DIAGNOSIS — N812 Incomplete uterovaginal prolapse: Secondary | ICD-10-CM | POA: Diagnosis not present

## 2018-10-20 DIAGNOSIS — Z4689 Encounter for fitting and adjustment of other specified devices: Secondary | ICD-10-CM | POA: Diagnosis not present

## 2018-10-20 NOTE — Progress Notes (Signed)
Chief Complaint  Patient presents with  . pessary cleaning    Blood pressure 138/70, pulse 93, height 5' (1.524 m), weight 114 lb (51.7 kg).  Lori Bishop presents today for routine follow up related to her pessary.   She uses a milex ring with support #4 She reports no vaginal discharge or vaginal bleeding.  Exam reveals no undue vaginal mucosal pressure of breakdown, no discharge and no vaginal bleeding.  The pessary is removed, cleaned and replaced without difficulty.    Lori Bishop will be sen back in 4 months for continued follow up.  Florian Buff, MD  10/20/2018 10:02 AM

## 2018-10-22 DIAGNOSIS — I10 Essential (primary) hypertension: Secondary | ICD-10-CM | POA: Diagnosis not present

## 2018-10-22 DIAGNOSIS — I5032 Chronic diastolic (congestive) heart failure: Secondary | ICD-10-CM | POA: Diagnosis not present

## 2018-10-22 DIAGNOSIS — M1A00X0 Idiopathic chronic gout, unspecified site, without tophus (tophi): Secondary | ICD-10-CM | POA: Diagnosis not present

## 2018-10-22 DIAGNOSIS — Z0001 Encounter for general adult medical examination with abnormal findings: Secondary | ICD-10-CM | POA: Diagnosis not present

## 2018-10-22 DIAGNOSIS — Z1389 Encounter for screening for other disorder: Secondary | ICD-10-CM | POA: Diagnosis not present

## 2018-10-23 DIAGNOSIS — Z0001 Encounter for general adult medical examination with abnormal findings: Secondary | ICD-10-CM | POA: Diagnosis not present

## 2018-10-23 DIAGNOSIS — I10 Essential (primary) hypertension: Secondary | ICD-10-CM | POA: Diagnosis not present

## 2018-11-22 DIAGNOSIS — I5032 Chronic diastolic (congestive) heart failure: Secondary | ICD-10-CM | POA: Diagnosis not present

## 2018-11-22 DIAGNOSIS — I10 Essential (primary) hypertension: Secondary | ICD-10-CM | POA: Diagnosis not present

## 2018-12-22 DIAGNOSIS — I10 Essential (primary) hypertension: Secondary | ICD-10-CM | POA: Diagnosis not present

## 2018-12-22 DIAGNOSIS — I5032 Chronic diastolic (congestive) heart failure: Secondary | ICD-10-CM | POA: Diagnosis not present

## 2019-01-22 DIAGNOSIS — I10 Essential (primary) hypertension: Secondary | ICD-10-CM | POA: Diagnosis not present

## 2019-01-22 DIAGNOSIS — K219 Gastro-esophageal reflux disease without esophagitis: Secondary | ICD-10-CM | POA: Diagnosis not present

## 2019-02-18 ENCOUNTER — Telehealth: Payer: Self-pay | Admitting: Obstetrics & Gynecology

## 2019-02-18 NOTE — Telephone Encounter (Signed)

## 2019-02-19 ENCOUNTER — Other Ambulatory Visit: Payer: Self-pay

## 2019-02-19 ENCOUNTER — Ambulatory Visit: Payer: Medicare Other | Admitting: Obstetrics & Gynecology

## 2019-02-19 VITALS — BP 140/68 | HR 78 | Ht 60.0 in

## 2019-02-19 DIAGNOSIS — Z4689 Encounter for fitting and adjustment of other specified devices: Secondary | ICD-10-CM

## 2019-02-21 DIAGNOSIS — I5032 Chronic diastolic (congestive) heart failure: Secondary | ICD-10-CM | POA: Diagnosis not present

## 2019-02-21 DIAGNOSIS — K219 Gastro-esophageal reflux disease without esophagitis: Secondary | ICD-10-CM | POA: Diagnosis not present

## 2019-03-10 ENCOUNTER — Telehealth: Payer: Self-pay | Admitting: Orthopedic Surgery

## 2019-03-10 NOTE — Telephone Encounter (Signed)
Set up tele-visit

## 2019-03-10 NOTE — Telephone Encounter (Signed)
Call/voice message recieved from patient's designated contact, niece Tonye Royalty; said she is having problems with leg weakness.  I returned the call to phone# left on message #[phone]; reached voice mail - left message. (may require Dr's review and advice, based on current covid restrictions.)

## 2019-03-10 NOTE — Telephone Encounter (Signed)
Patient's contact, Ms. Oretha Milch, returned call. Relayed we will forward message to Dr Hilda Lias to advise as to in-person, telephone visit, or other recommendation for patient's bilateral leg weakness, based on current covid-19 restrictions.

## 2019-03-10 NOTE — Telephone Encounter (Signed)
Called back to patient/designated contact/poa; relayed; scheduled accordingly.

## 2019-03-11 ENCOUNTER — Encounter: Payer: Self-pay | Admitting: Orthopaedic Surgery

## 2019-03-11 ENCOUNTER — Ambulatory Visit (INDEPENDENT_AMBULATORY_CARE_PROVIDER_SITE_OTHER): Payer: Medicare Other | Admitting: Orthopaedic Surgery

## 2019-03-11 ENCOUNTER — Other Ambulatory Visit: Payer: Self-pay

## 2019-03-11 DIAGNOSIS — G8929 Other chronic pain: Secondary | ICD-10-CM | POA: Diagnosis not present

## 2019-03-11 DIAGNOSIS — M25561 Pain in right knee: Secondary | ICD-10-CM

## 2019-03-11 DIAGNOSIS — M25562 Pain in left knee: Secondary | ICD-10-CM

## 2019-03-11 NOTE — Progress Notes (Signed)
Virtual Visit via Telephone Note  I connected with@ on 03/11/19 at  9:50 AM EST by telephone and verified that I am speaking with the correct person using two identifiers.  Location: Patient: home Provider: office [place]   I discussed the limitations, risks, security and privacy concerns of performing an evaluation and management service by telephone and the availability of in person appointments. I also discussed with the patient that there may be a patient responsible charge related to this service. The patient expressed understanding and agreed to proceed.   History of Present Illness: She has been having more pain of the left knee and giving way.  She has some swelling at times.  Her pain is better the last few days.  She has no trauma.  She is very reluctant to leave home with all the COVID19 increase in the community.  She would like to try a brace for the knee.  I have explained how to measure for it.  A family member who was also on the other line will come to office and get the brace to try.   Observations/Objective: Per above.  Assessment and Plan: Encounter Diagnoses  Name Primary?  . Chronic pain of right knee Yes  . Chronic pain of left knee      Follow Up Instructions: Get brace.  Virtual visit in six weeks.   I discussed the assessment and treatment plan with the patient. The patient was provided an opportunity to ask questions and all were answered. The patient agreed with the plan and demonstrated an understanding of the instructions.   The patient was advised to call back or seek an in-person evaluation if the symptoms worsen or if the condition fails to improve as anticipated.  I provided 13 minutes of non-face-to-face time during this encounter.   Darreld Mclean, MD

## 2019-03-19 DIAGNOSIS — M25562 Pain in left knee: Secondary | ICD-10-CM | POA: Diagnosis not present

## 2019-03-19 DIAGNOSIS — I10 Essential (primary) hypertension: Secondary | ICD-10-CM | POA: Diagnosis not present

## 2019-03-19 DIAGNOSIS — K219 Gastro-esophageal reflux disease without esophagitis: Secondary | ICD-10-CM | POA: Diagnosis not present

## 2019-03-19 DIAGNOSIS — I5032 Chronic diastolic (congestive) heart failure: Secondary | ICD-10-CM | POA: Diagnosis not present

## 2019-03-19 DIAGNOSIS — M1A00X0 Idiopathic chronic gout, unspecified site, without tophus (tophi): Secondary | ICD-10-CM | POA: Diagnosis not present

## 2019-03-23 ENCOUNTER — Telehealth: Payer: Self-pay | Admitting: Orthopaedic Surgery

## 2019-03-23 NOTE — Telephone Encounter (Signed)
Patient's daughter, designated contact Tonye Royalty, called to relay that the brace that patient was fitted with had felt fine at first, and now feels too small.  Please call [phone].

## 2019-03-24 NOTE — Telephone Encounter (Signed)
LVM stating the brace can be changed out whenever is convenient. Also left call back # for any questions.

## 2019-03-30 ENCOUNTER — Telehealth: Payer: Self-pay | Admitting: Orthopaedic Surgery

## 2019-03-30 NOTE — Telephone Encounter (Signed)
Ms. Lori Bishop has stopped by office to follow up on message - she is here now.

## 2019-03-30 NOTE — Telephone Encounter (Signed)
There was a brace given, and brace returned. It was too tight, and she could not put it on  She has asked if okay to get a brace over the counter, told her that would be fine, and if it does not work, let us know and we can have Dr Hilda Lias sign for a brace from [employer].

## 2019-03-30 NOTE — Telephone Encounter (Signed)
Patient's  Niece Ms. Oretha Milch, has a question and she would like to ask the lady who does the braces here in our office.  Please call her when you have a minute   Thanks

## 2019-03-30 NOTE — Telephone Encounter (Signed)
Left message for her to call me back. 

## 2019-04-11 ENCOUNTER — Other Ambulatory Visit: Payer: Self-pay

## 2019-04-11 ENCOUNTER — Ambulatory Visit: Payer: Medicare Other | Attending: Internal Medicine

## 2019-04-11 DIAGNOSIS — Z23 Encounter for immunization: Secondary | ICD-10-CM | POA: Insufficient documentation

## 2019-04-11 NOTE — Progress Notes (Signed)
   Covid-19 Vaccination Clinic  Name:  Lori Bishop    MRN: [id number] DOB: 1921-01-06  04/11/2019  [name] was observed post Covid-19 immunization for 15 minutes without incidence. She was provided with Vaccine Information Sheet and instruction to access the V-Safe system.   [name] was instructed to call 911 with any severe reactions post vaccine: Marland Kitchen Difficulty breathing  . Swelling of your face and throat  . A fast heartbeat  . A bad rash all over your body  . Dizziness and weakness    Immunizations Administered    Name Date Dose VIS Date Route   Moderna COVID-19 Vaccine 04/11/2019 12:53 PM 0.5 mL 02/03/2019 Intramuscular   Manufacturer: Moderna   Lot: [id number]   NDC: 96283-662-94

## 2019-04-19 DIAGNOSIS — I10 Essential (primary) hypertension: Secondary | ICD-10-CM | POA: Diagnosis not present

## 2019-04-19 DIAGNOSIS — I5032 Chronic diastolic (congestive) heart failure: Secondary | ICD-10-CM | POA: Diagnosis not present

## 2019-04-22 ENCOUNTER — Ambulatory Visit: Payer: Medicare Other | Admitting: Orthopaedic Surgery

## 2019-04-27 NOTE — Progress Notes (Signed)
Chief Complaint  Patient presents with  . Pessary Check    Blood pressure 140/68, pulse 78, height 5' (1.524 m).  Lori Bishop presents today for routine follow up related to her pessary.   She uses a Milex ring with support #4 She reports no vaginal discharge or vaginal bleeding.  Exam reveals no undue vaginal mucosal pressure of breakdown, no discharge and no vaginal bleeding.  The pessary is removed, cleaned and replaced without difficulty.    Lori Bishop will be sen back in 4 months for continued follow up.  [name], MD  04/27/2019 6:53 PM

## 2019-05-12 ENCOUNTER — Ambulatory Visit: Payer: Medicare Other

## 2019-05-12 NOTE — Progress Notes (Signed)
   Covid-19 Vaccination Clinic  Name:  Lori Bishop    MRN: [id number] DOB: 1920/03/30  05/12/2019  Lori Bishop was observed post Covid-19 immunization for 15 minutes without incident. She was provided with Vaccine Information Sheet and instruction to access the V-Safe system.   Lori Bishop was instructed to call 911 with any severe reactions post vaccine: Marland Kitchen Difficulty breathing  . Swelling of face and throat  . A fast heartbeat  . A bad rash all over body  . Dizziness and weakness

## 2019-05-17 DIAGNOSIS — I10 Essential (primary) hypertension: Secondary | ICD-10-CM | POA: Diagnosis not present

## 2019-05-17 DIAGNOSIS — K219 Gastro-esophageal reflux disease without esophagitis: Secondary | ICD-10-CM | POA: Diagnosis not present

## 2019-06-04 DIAGNOSIS — I5032 Chronic diastolic (congestive) heart failure: Secondary | ICD-10-CM | POA: Diagnosis not present

## 2019-06-04 DIAGNOSIS — M159 Polyosteoarthritis, unspecified: Secondary | ICD-10-CM | POA: Diagnosis not present

## 2019-06-18 ENCOUNTER — Telehealth: Payer: Self-pay | Admitting: Obstetrics & Gynecology

## 2019-06-18 NOTE — Telephone Encounter (Signed)

## 2019-06-19 ENCOUNTER — Other Ambulatory Visit: Payer: Self-pay

## 2019-06-19 ENCOUNTER — Encounter: Payer: Self-pay | Admitting: Obstetrics & Gynecology

## 2019-06-19 ENCOUNTER — Ambulatory Visit (INDEPENDENT_AMBULATORY_CARE_PROVIDER_SITE_OTHER): Payer: Medicare Other | Admitting: Obstetrics & Gynecology

## 2019-06-19 VITALS — BP 139/73 | HR 77 | Ht 60.0 in | Wt 112.0 lb

## 2019-06-19 DIAGNOSIS — Z466 Encounter for fitting and adjustment of urinary device: Secondary | ICD-10-CM | POA: Diagnosis not present

## 2019-06-19 DIAGNOSIS — Z4689 Encounter for fitting and adjustment of other specified devices: Secondary | ICD-10-CM

## 2019-06-19 NOTE — Progress Notes (Signed)
Chief Complaint  Patient presents with  . Pessary Check    Blood pressure 139/73, pulse 77, height 5' (1.524 m), weight 112 lb (50.8 kg).  Lori Bishop presents today for routine follow up related to her pessary.   She uses a Milex ring with support #4 She reports no vaginal discharge or vaginal bleeding.  Exam reveals no undue vaginal mucosal pressure of breakdown, no discharge and no vaginal bleeding.  The pessary is removed, cleaned and replaced without difficulty.    Lori Bishop will be sen back in 4 months for continued follow up.  [name], MD  06/19/2019 9:46 AM

## 2019-07-04 DIAGNOSIS — K219 Gastro-esophageal reflux disease without esophagitis: Secondary | ICD-10-CM | POA: Diagnosis not present

## 2019-07-04 DIAGNOSIS — I5032 Chronic diastolic (congestive) heart failure: Secondary | ICD-10-CM | POA: Diagnosis not present

## 2019-08-04 DIAGNOSIS — I10 Essential (primary) hypertension: Secondary | ICD-10-CM | POA: Diagnosis not present

## 2019-08-04 DIAGNOSIS — I5032 Chronic diastolic (congestive) heart failure: Secondary | ICD-10-CM | POA: Diagnosis not present

## 2019-09-23 DIAGNOSIS — I5032 Chronic diastolic (congestive) heart failure: Secondary | ICD-10-CM | POA: Diagnosis not present

## 2019-09-23 DIAGNOSIS — Z1389 Encounter for screening for other disorder: Secondary | ICD-10-CM | POA: Diagnosis not present

## 2019-09-23 DIAGNOSIS — I10 Essential (primary) hypertension: Secondary | ICD-10-CM | POA: Diagnosis not present

## 2019-09-23 DIAGNOSIS — M1A00X0 Idiopathic chronic gout, unspecified site, without tophus (tophi): Secondary | ICD-10-CM | POA: Diagnosis not present

## 2019-09-23 DIAGNOSIS — N182 Chronic kidney disease, stage 2 (mild): Secondary | ICD-10-CM | POA: Diagnosis not present

## 2019-09-23 DIAGNOSIS — Z0001 Encounter for general adult medical examination with abnormal findings: Secondary | ICD-10-CM | POA: Diagnosis not present

## 2019-09-23 DIAGNOSIS — Z0000 Encounter for general adult medical examination without abnormal findings: Secondary | ICD-10-CM | POA: Diagnosis not present

## 2019-10-14 DIAGNOSIS — H6123 Impacted cerumen, bilateral: Secondary | ICD-10-CM | POA: Diagnosis not present

## 2019-10-24 DIAGNOSIS — I5032 Chronic diastolic (congestive) heart failure: Secondary | ICD-10-CM | POA: Diagnosis not present

## 2019-10-24 DIAGNOSIS — I10 Essential (primary) hypertension: Secondary | ICD-10-CM | POA: Diagnosis not present

## 2019-10-26 ENCOUNTER — Encounter: Payer: Self-pay | Admitting: Obstetrics & Gynecology

## 2019-10-26 ENCOUNTER — Ambulatory Visit (INDEPENDENT_AMBULATORY_CARE_PROVIDER_SITE_OTHER): Payer: Medicare Other | Admitting: Obstetrics & Gynecology

## 2019-10-26 VITALS — BP 132/71 | HR 84 | Ht 60.0 in | Wt 119.0 lb

## 2019-10-26 DIAGNOSIS — Z4689 Encounter for fitting and adjustment of other specified devices: Secondary | ICD-10-CM | POA: Diagnosis not present

## 2019-10-26 DIAGNOSIS — N812 Incomplete uterovaginal prolapse: Secondary | ICD-10-CM

## 2019-10-26 NOTE — Progress Notes (Signed)
Chief Complaint  Patient presents with  . Pessary Check    Blood pressure 132/71, pulse 84, height 5' (1.524 m), weight 119 lb (54 kg).  Lori Bishop presents today for routine follow up related to her pessary.   She uses a Milex ring with support #4 She reports no vaginal discharge or vaginal bleeding.  Exam reveals no undue vaginal mucosal pressure of breakdown, no discharge and no vaginal bleeding.  The pessary is removed, cleaned and replaced without difficulty.      ICD-10-CM   1. Pessary maintenance, Milex ring with support #4, original fit: 07/2017  Z46.89   2. Incomplete uterovaginal prolapse  N81.2      Lori Bishop will be sen back in 4 months for continued follow up.  [name], MD  10/26/2019 9:19 AM

## 2019-11-19 ENCOUNTER — Ambulatory Visit: Payer: Medicare Other | Attending: Internal Medicine

## 2019-11-19 DIAGNOSIS — Z23 Encounter for immunization: Secondary | ICD-10-CM

## 2019-11-24 DIAGNOSIS — N182 Chronic kidney disease, stage 2 (mild): Secondary | ICD-10-CM | POA: Diagnosis not present

## 2019-11-24 DIAGNOSIS — I5032 Chronic diastolic (congestive) heart failure: Secondary | ICD-10-CM | POA: Diagnosis not present

## 2019-12-04 DIAGNOSIS — Z23 Encounter for immunization: Secondary | ICD-10-CM | POA: Diagnosis not present

## 2019-12-24 DIAGNOSIS — N182 Chronic kidney disease, stage 2 (mild): Secondary | ICD-10-CM | POA: Diagnosis not present

## 2019-12-24 DIAGNOSIS — I10 Essential (primary) hypertension: Secondary | ICD-10-CM | POA: Diagnosis not present

## 2020-01-24 DIAGNOSIS — I5032 Chronic diastolic (congestive) heart failure: Secondary | ICD-10-CM | POA: Diagnosis not present

## 2020-01-24 DIAGNOSIS — N182 Chronic kidney disease, stage 2 (mild): Secondary | ICD-10-CM | POA: Diagnosis not present

## 2020-02-23 ENCOUNTER — Ambulatory Visit: Payer: Medicare Other | Admitting: Adult Health

## 2020-02-23 DIAGNOSIS — N182 Chronic kidney disease, stage 2 (mild): Secondary | ICD-10-CM | POA: Diagnosis not present

## 2020-02-23 DIAGNOSIS — I10 Essential (primary) hypertension: Secondary | ICD-10-CM | POA: Diagnosis not present

## 2020-03-02 ENCOUNTER — Ambulatory Visit: Payer: Medicare Other | Admitting: Adult Health

## 2020-03-15 ENCOUNTER — Ambulatory Visit: Payer: Medicare Other | Admitting: Adult Health

## 2020-03-15 ENCOUNTER — Encounter: Payer: Self-pay | Admitting: Adult Health

## 2020-03-15 ENCOUNTER — Other Ambulatory Visit: Payer: Self-pay

## 2020-03-15 VITALS — BP 150/79 | HR 76 | Ht 60.0 in | Wt 120.0 lb

## 2020-03-15 DIAGNOSIS — N898 Other specified noninflammatory disorders of vagina: Secondary | ICD-10-CM | POA: Diagnosis not present

## 2020-03-15 DIAGNOSIS — N812 Incomplete uterovaginal prolapse: Secondary | ICD-10-CM

## 2020-03-15 DIAGNOSIS — Z4689 Encounter for fitting and adjustment of other specified devices: Secondary | ICD-10-CM | POA: Diagnosis not present

## 2020-03-15 MED ORDER — METRONIDAZOLE 0.75 % VA GEL: VAGINAL | 3 refills | Status: AC

## 2020-03-15 NOTE — Progress Notes (Signed)
  Subjective:     Patient ID: Lori Bishop, female   DOB: Dec 13, 1920, 87 y.o.   MRN: [id number]  HPI Lori Bishop is a 87 year old black female, widowed, PM in for pessary maintenance.She wants to leave pessary out this time has some low back pain. PCP is Dr Felecia Shelling.  Review of Systems Has some low back pain Denies any problems with urination or bowel movements Reviewed past medical,surgical, social and family history. Reviewed medications and allergies.     Objective:   Physical Exam BP (!) 150/79 (BP Location: Right Arm, Patient Position: Sitting, Cuff Size: Normal)   Pulse 76   Ht 5' (1.524 m)   Wt 120 lb (54.4 kg)   BMI 23.44 kg/m   Skin warm and dry.Pelvic: external genitalia is normal in appearance no lesions, vagina:pessary removed, creamy discharge  with odor,no irritation or lesions noted, urethra has no lesions or masses noted, cervix:smooth, uterus: normal size, shape and contour, non tender, no masses felt, adnexa: no masses or tenderness noted. Bladder is non tender and no masses felt   Upstream - 03/15/20 1014      Pregnancy Intention Screening   Does the patient want to become pregnant in the next year? No    Does the patient's partner want to become pregnant in the next year? No    Would the patient like to discuss contraceptive options today? No      Contraception Wrap Up   Current Method No Method - Other Reason   PM   End Method No Method - Other Reason   Pm   Contraception Counseling Provided No         Examination chaperoned by Lawanna Kobus RN    Assessment:     1. Pessary maintenance, Milex ring with support #4, original fit: 07/2017 Cleaned and place in bag for her to keep at home   2. Incomplete uterovaginal prolapse  3. Vaginal odor Will Rx Metrogel  Meds ordered this encounter  Medications  . metroNIDAZOLE (METROGEL VAGINAL) 0.75 % vaginal gel    Sig: Use 1 applicator  in vagina at bedtime for 5 nights then has needed    Dispense:  70 g    Refill:  3     Order Specific Question:   Supervising Provider    Answer:   [name] [[id number]]      Plan:     Follow up in 2 months or sooner of needed

## 2020-03-16 ENCOUNTER — Other Ambulatory Visit: Payer: Self-pay

## 2020-03-16 ENCOUNTER — Other Ambulatory Visit (HOSPITAL_COMMUNITY)
Admission: RE | Admit: 2020-03-16 | Discharge: 2020-03-16 | Disposition: A | Discharge status: Home / Self Care | Payer: Medicare Other | Source: Ambulatory Visit | Attending: Internal Medicine | Admitting: Internal Medicine

## 2020-03-16 DIAGNOSIS — N1832 Chronic kidney disease, stage 3b: Secondary | ICD-10-CM | POA: Diagnosis not present

## 2020-03-16 DIAGNOSIS — I5032 Chronic diastolic (congestive) heart failure: Secondary | ICD-10-CM | POA: Diagnosis not present

## 2020-03-16 DIAGNOSIS — Z1389 Encounter for screening for other disorder: Secondary | ICD-10-CM | POA: Diagnosis not present

## 2020-03-16 DIAGNOSIS — Z0001 Encounter for general adult medical examination with abnormal findings: Secondary | ICD-10-CM | POA: Diagnosis not present

## 2020-03-16 DIAGNOSIS — Z79899 Other long term (current) drug therapy: Secondary | ICD-10-CM | POA: Diagnosis not present

## 2020-03-16 DIAGNOSIS — I10 Essential (primary) hypertension: Secondary | ICD-10-CM | POA: Insufficient documentation

## 2020-03-16 LAB — CBC WITH DIFFERENTIAL/PLATELET
Abs Immature Granulocytes: 0.01 10*3/uL (ref 0.00–0.07)
Basophils Absolute: 0.1 10*3/uL (ref 0.0–0.1)
Basophils Relative: 1 %
Eosinophils Absolute: 0.2 10*3/uL (ref 0.0–0.5)
Eosinophils Relative: 3 %
HCT: 42 % (ref 36.0–46.0)
Hemoglobin: 13.7 g/dL (ref 12.0–15.0)
Immature Granulocytes: 0 %
Lymphocytes Relative: 29 %
Lymphs Abs: 2.5 10*3/uL (ref 0.7–4.0)
MCH: 28.2 pg (ref 26.0–34.0)
MCHC: 32.6 g/dL (ref 30.0–36.0)
MCV: 86.6 fL (ref 80.0–100.0)
Monocytes Absolute: 1 10*3/uL (ref 0.1–1.0)
Monocytes Relative: 12 %
Neutro Abs: 4.8 10*3/uL (ref 1.7–7.7)
Neutrophils Relative %: 55 %
Platelets: 211 10*3/uL (ref 150–400)
RBC: 4.85 MIL/uL (ref 3.87–5.11)
RDW: 15.5 % (ref 11.5–15.5)
WBC: 8.6 10*3/uL (ref 4.0–10.5)
nRBC: 0 % (ref 0.0–0.2)

## 2020-03-16 LAB — HEPATIC FUNCTION PANEL
ALT: 12 U/L (ref 0–44)
AST: 23 U/L (ref 15–41)
Albumin: 3.8 g/dL (ref 3.5–5.0)
Alkaline Phosphatase: 92 U/L (ref 38–126)
Bilirubin, Direct: 0.1 mg/dL (ref 0.0–0.2)
Indirect Bilirubin: 0.6 mg/dL (ref 0.3–0.9)
Total Bilirubin: 0.7 mg/dL (ref 0.3–1.2)
Total Protein: 7.3 g/dL (ref 6.5–8.1)

## 2020-03-16 LAB — BASIC METABOLIC PANEL
Anion gap: 8 (ref 5–15)
BUN: 15 mg/dL (ref 8–23)
CO2: 28 mmol/L (ref 22–32)
Calcium: 9 mg/dL (ref 8.9–10.3)
Chloride: 102 mmol/L (ref 98–111)
Creatinine, Ser: 1.07 mg/dL — ABNORMAL HIGH (ref 0.44–1.00)
GFR, Estimated: 47 mL/min — ABNORMAL LOW (ref >60)
Glucose, Bld: 95 mg/dL (ref 70–99)
Potassium: 4.4 mmol/L (ref 3.5–5.1)
Sodium: 138 mmol/L (ref 135–145)

## 2020-03-16 LAB — LIPID PANEL
Cholesterol: 198 mg/dL (ref 0–200)
HDL: 83 mg/dL (ref >40)
LDL Cholesterol: 103 mg/dL — ABNORMAL HIGH (ref 0–99)
Total CHOL/HDL Ratio: 2.4 RATIO
Triglycerides: 61 mg/dL (ref <150)
VLDL: 12 mg/dL (ref 0–40)

## 2020-04-16 DIAGNOSIS — I10 Essential (primary) hypertension: Secondary | ICD-10-CM | POA: Diagnosis not present

## 2020-04-16 DIAGNOSIS — I5032 Chronic diastolic (congestive) heart failure: Secondary | ICD-10-CM | POA: Diagnosis not present

## 2020-05-03 ENCOUNTER — Other Ambulatory Visit: Payer: Self-pay

## 2020-05-03 ENCOUNTER — Encounter: Payer: Self-pay | Admitting: Adult Health

## 2020-05-03 ENCOUNTER — Ambulatory Visit (INDEPENDENT_AMBULATORY_CARE_PROVIDER_SITE_OTHER): Payer: Medicare Other | Admitting: Adult Health

## 2020-05-03 VITALS — BP 144/70 | HR 83 | Ht 60.0 in | Wt 120.0 lb

## 2020-05-03 DIAGNOSIS — N812 Incomplete uterovaginal prolapse: Secondary | ICD-10-CM

## 2020-05-03 DIAGNOSIS — Z4689 Encounter for fitting and adjustment of other specified devices: Secondary | ICD-10-CM

## 2020-05-03 DIAGNOSIS — R32 Unspecified urinary incontinence: Secondary | ICD-10-CM | POA: Diagnosis not present

## 2020-05-03 NOTE — Progress Notes (Signed)
  Subjective:     Patient ID: Lori Bishop, female   DOB: [birth year], 87 y.o.   MRN: [id number]  HPI Lori Bishop is a 87 year old black female, widowed,PM in complaining of leaking urine now, had pessary removed 03/15/20 at her request for low back and the pain has resolved, and wants to have pessary put back in place. PCP is Dr Felecia Shelling.  Review of Systems Urine incontinence, leaks She denies any burning, itching or trouble voiding  Reviewed past medical,surgical, social and family history. Reviewed medications and allergies.     Objective:   Physical Exam BP (!) 144/70 (BP Location: Left Arm, Patient Position: Sitting, Cuff Size: Normal)   Pulse 83   Ht 5' (1.524 m)   Wt 120 lb (54.4 kg)   BMI 23.44 kg/m   Skin warm and dry.Pelvic: external genitalia is normal in appearance no lesions, vagina: no discharge, pessary easily inserted.   Fall risk is low PHQ 2 score is 0  Upstream - 05/03/20 0947      Pregnancy Intention Screening   Does the patient want to become pregnant in the next year? N/A    Does the patient's partner want to become pregnant in the next year? N/A    Would the patient like to discuss contraceptive options today? N/A      Contraception Wrap Up   Current Method No Method - Other Reason   postmenopause   End Method No Method - Other Reason   postmenopause   Contraception Counseling Provided No         Examination chaperoned by Modesto Charon NP student.  Assessment:     1. Pessary maintenance, Milex ring with support #4, original fit: 07/2017  2. Incomplete uterovaginal prolapse  3. Urinary incontinence, unspecified type Pessary reinserted     Plan:     Follow up in 3 months for pessary maintenance or sooner if needed

## 2020-05-13 ENCOUNTER — Ambulatory Visit: Payer: Medicare Other | Admitting: Adult Health

## 2020-05-13 ENCOUNTER — Ambulatory Visit: Payer: Medicare Other | Admitting: Obstetrics & Gynecology

## 2020-05-14 DIAGNOSIS — I5032 Chronic diastolic (congestive) heart failure: Secondary | ICD-10-CM | POA: Diagnosis not present

## 2020-05-14 DIAGNOSIS — I10 Essential (primary) hypertension: Secondary | ICD-10-CM | POA: Diagnosis not present

## 2020-05-25 DIAGNOSIS — H6123 Impacted cerumen, bilateral: Secondary | ICD-10-CM | POA: Diagnosis not present

## 2020-05-25 DIAGNOSIS — H903 Sensorineural hearing loss, bilateral: Secondary | ICD-10-CM | POA: Diagnosis not present

## 2020-06-14 DIAGNOSIS — I5032 Chronic diastolic (congestive) heart failure: Secondary | ICD-10-CM | POA: Diagnosis not present

## 2020-06-14 DIAGNOSIS — I10 Essential (primary) hypertension: Secondary | ICD-10-CM | POA: Diagnosis not present

## 2020-07-14 DIAGNOSIS — I10 Essential (primary) hypertension: Secondary | ICD-10-CM | POA: Diagnosis not present

## 2020-07-14 DIAGNOSIS — I5032 Chronic diastolic (congestive) heart failure: Secondary | ICD-10-CM | POA: Diagnosis not present

## 2020-08-04 ENCOUNTER — Encounter: Payer: Self-pay | Admitting: Obstetrics & Gynecology

## 2020-08-04 ENCOUNTER — Ambulatory Visit: Payer: Medicare Other | Admitting: Obstetrics & Gynecology

## 2020-08-04 ENCOUNTER — Other Ambulatory Visit: Payer: Self-pay

## 2020-08-04 VITALS — BP 150/75 | HR 83

## 2020-08-04 DIAGNOSIS — N812 Incomplete uterovaginal prolapse: Secondary | ICD-10-CM | POA: Diagnosis not present

## 2020-08-04 DIAGNOSIS — Z4689 Encounter for fitting and adjustment of other specified devices: Secondary | ICD-10-CM

## 2020-08-04 NOTE — Progress Notes (Signed)
Chief Complaint  Patient presents with  . Pessary Check    Blood pressure (!) 150/75, pulse 83.  Lori Bishop presents today for routine follow up related to her pessary.   She uses a Milex ring with support #4 She reports no vaginal discharge and no vaginal bleeding   Likert scale(1 not bothersome -5 very bothersome)  :  1  Exam reveals no undue vaginal mucosal pressure of breakdown, no discharge and no vaginal bleeding.  Vaginal Epithelial Abnormality Classification System:   0 0    No abnormalities 1    Epithelial erythema 2    Granulation tissue 3    Epithelial break or erosion, 1 cm or less 4    Epithelial break or erosion, 1 cm or greater  The pessary is removed, cleaned and replaced without difficulty.      ICD-10-CM   1. Pessary maintenance, Milex ring with support #4, original fit: 07/2017  Z46.89   2. Incomplete uterovaginal prolapse  N81.2      Lori Bishop will be sen back in 4 months for continued follow up.  [name], MD  08/04/2020 9:24 AM

## 2020-08-14 DIAGNOSIS — I10 Essential (primary) hypertension: Secondary | ICD-10-CM | POA: Diagnosis not present

## 2020-08-14 DIAGNOSIS — I5032 Chronic diastolic (congestive) heart failure: Secondary | ICD-10-CM | POA: Diagnosis not present

## 2020-09-04 ENCOUNTER — Encounter (HOSPITAL_COMMUNITY): Payer: Self-pay | Admitting: Emergency Medicine

## 2020-09-04 ENCOUNTER — Encounter (HOSPITAL_COMMUNITY): Payer: Self-pay

## 2020-09-04 ENCOUNTER — Emergency Department (HOSPITAL_COMMUNITY): Payer: Medicare Other

## 2020-09-04 ENCOUNTER — Inpatient Hospital Stay (HOSPITAL_COMMUNITY)
Admission: EM | Admit: 2020-09-04 | Discharge: 2020-09-08 | DRG: 193 | Disposition: A | Discharge status: Skilled Nursing Facility | Payer: Medicare Other | Attending: Internal Medicine | Admitting: Internal Medicine

## 2020-09-04 ENCOUNTER — Other Ambulatory Visit: Payer: Self-pay

## 2020-09-04 ENCOUNTER — Emergency Department (HOSPITAL_COMMUNITY)
Admission: EM | Admit: 2020-09-04 | Discharge: 2020-09-04 | Disposition: A | Discharge status: Home / Self Care | Payer: Medicare Other | Source: Home / Self Care | Attending: Emergency Medicine | Admitting: Emergency Medicine

## 2020-09-04 DIAGNOSIS — I959 Hypotension, unspecified: Secondary | ICD-10-CM | POA: Diagnosis present

## 2020-09-04 DIAGNOSIS — R52 Pain, unspecified: Secondary | ICD-10-CM

## 2020-09-04 DIAGNOSIS — Z79899 Other long term (current) drug therapy: Secondary | ICD-10-CM | POA: Insufficient documentation

## 2020-09-04 DIAGNOSIS — S51812A Laceration without foreign body of left forearm, initial encounter: Secondary | ICD-10-CM | POA: Insufficient documentation

## 2020-09-04 DIAGNOSIS — Z96631 Presence of right artificial wrist joint: Secondary | ICD-10-CM | POA: Insufficient documentation

## 2020-09-04 DIAGNOSIS — J9811 Atelectasis: Secondary | ICD-10-CM | POA: Diagnosis not present

## 2020-09-04 DIAGNOSIS — S5012XA Contusion of left forearm, initial encounter: Secondary | ICD-10-CM | POA: Insufficient documentation

## 2020-09-04 DIAGNOSIS — W19XXXA Unspecified fall, initial encounter: Secondary | ICD-10-CM | POA: Diagnosis present

## 2020-09-04 DIAGNOSIS — I2510 Atherosclerotic heart disease of native coronary artery without angina pectoris: Secondary | ICD-10-CM | POA: Diagnosis present

## 2020-09-04 DIAGNOSIS — M79605 Pain in left leg: Secondary | ICD-10-CM | POA: Diagnosis not present

## 2020-09-04 DIAGNOSIS — E860 Dehydration: Secondary | ICD-10-CM

## 2020-09-04 DIAGNOSIS — M810 Age-related osteoporosis without current pathological fracture: Secondary | ICD-10-CM | POA: Diagnosis not present

## 2020-09-04 DIAGNOSIS — Z8249 Family history of ischemic heart disease and other diseases of the circulatory system: Secondary | ICD-10-CM

## 2020-09-04 DIAGNOSIS — Z20822 Contact with and (suspected) exposure to covid-19: Secondary | ICD-10-CM | POA: Diagnosis present

## 2020-09-04 DIAGNOSIS — K429 Umbilical hernia without obstruction or gangrene: Secondary | ICD-10-CM | POA: Diagnosis not present

## 2020-09-04 DIAGNOSIS — M25562 Pain in left knee: Secondary | ICD-10-CM | POA: Diagnosis present

## 2020-09-04 DIAGNOSIS — J189 Pneumonia, unspecified organism: Secondary | ICD-10-CM | POA: Diagnosis not present

## 2020-09-04 DIAGNOSIS — Y92003 Bedroom of unspecified non-institutional (private) residence as the place of occurrence of the external cause: Secondary | ICD-10-CM

## 2020-09-04 DIAGNOSIS — R600 Localized edema: Secondary | ICD-10-CM | POA: Diagnosis not present

## 2020-09-04 DIAGNOSIS — E872 Acidosis, unspecified: Secondary | ICD-10-CM

## 2020-09-04 DIAGNOSIS — I110 Hypertensive heart disease with heart failure: Secondary | ICD-10-CM | POA: Diagnosis present

## 2020-09-04 DIAGNOSIS — Z96641 Presence of right artificial hip joint: Secondary | ICD-10-CM | POA: Diagnosis present

## 2020-09-04 DIAGNOSIS — Z87891 Personal history of nicotine dependence: Secondary | ICD-10-CM

## 2020-09-04 DIAGNOSIS — R0689 Other abnormalities of breathing: Secondary | ICD-10-CM | POA: Diagnosis not present

## 2020-09-04 DIAGNOSIS — J9601 Acute respiratory failure with hypoxia: Secondary | ICD-10-CM | POA: Diagnosis present

## 2020-09-04 DIAGNOSIS — Z823 Family history of stroke: Secondary | ICD-10-CM

## 2020-09-04 DIAGNOSIS — R6510 Systemic inflammatory response syndrome (SIRS) of non-infectious origin without acute organ dysfunction: Secondary | ICD-10-CM

## 2020-09-04 DIAGNOSIS — R531 Weakness: Secondary | ICD-10-CM | POA: Diagnosis not present

## 2020-09-04 DIAGNOSIS — R6889 Other general symptoms and signs: Secondary | ICD-10-CM | POA: Diagnosis not present

## 2020-09-04 DIAGNOSIS — K7290 Hepatic failure, unspecified without coma: Secondary | ICD-10-CM | POA: Diagnosis not present

## 2020-09-04 DIAGNOSIS — W06XXXA Fall from bed, initial encounter: Secondary | ICD-10-CM | POA: Diagnosis present

## 2020-09-04 DIAGNOSIS — S40022A Contusion of left upper arm, initial encounter: Secondary | ICD-10-CM

## 2020-09-04 DIAGNOSIS — K219 Gastro-esophageal reflux disease without esophagitis: Secondary | ICD-10-CM | POA: Diagnosis present

## 2020-09-04 DIAGNOSIS — M1712 Unilateral primary osteoarthritis, left knee: Secondary | ICD-10-CM | POA: Diagnosis present

## 2020-09-04 DIAGNOSIS — Z743 Need for continuous supervision: Secondary | ICD-10-CM | POA: Diagnosis not present

## 2020-09-04 DIAGNOSIS — M1990 Unspecified osteoarthritis, unspecified site: Secondary | ICD-10-CM | POA: Diagnosis present

## 2020-09-04 DIAGNOSIS — N179 Acute kidney failure, unspecified: Secondary | ICD-10-CM | POA: Diagnosis present

## 2020-09-04 DIAGNOSIS — M19032 Primary osteoarthritis, left wrist: Secondary | ICD-10-CM | POA: Diagnosis not present

## 2020-09-04 DIAGNOSIS — D72829 Elevated white blood cell count, unspecified: Secondary | ICD-10-CM | POA: Diagnosis present

## 2020-09-04 DIAGNOSIS — K839 Disease of biliary tract, unspecified: Secondary | ICD-10-CM | POA: Diagnosis not present

## 2020-09-04 DIAGNOSIS — Q741 Congenital malformation of knee: Secondary | ICD-10-CM | POA: Diagnosis not present

## 2020-09-04 DIAGNOSIS — M47816 Spondylosis without myelopathy or radiculopathy, lumbar region: Secondary | ICD-10-CM | POA: Diagnosis present

## 2020-09-04 DIAGNOSIS — G8929 Other chronic pain: Secondary | ICD-10-CM | POA: Diagnosis present

## 2020-09-04 DIAGNOSIS — J90 Pleural effusion, not elsewhere classified: Secondary | ICD-10-CM | POA: Diagnosis not present

## 2020-09-04 DIAGNOSIS — I5032 Chronic diastolic (congestive) heart failure: Secondary | ICD-10-CM | POA: Insufficient documentation

## 2020-09-04 DIAGNOSIS — M25552 Pain in left hip: Secondary | ICD-10-CM | POA: Diagnosis not present

## 2020-09-04 DIAGNOSIS — Z66 Do not resuscitate: Secondary | ICD-10-CM | POA: Diagnosis present

## 2020-09-04 DIAGNOSIS — M25462 Effusion, left knee: Secondary | ICD-10-CM | POA: Diagnosis not present

## 2020-09-04 DIAGNOSIS — I700 Atherosclerosis of aorta: Secondary | ICD-10-CM | POA: Diagnosis not present

## 2020-09-04 DIAGNOSIS — M25561 Pain in right knee: Secondary | ICD-10-CM | POA: Diagnosis not present

## 2020-09-04 DIAGNOSIS — M109 Gout, unspecified: Secondary | ICD-10-CM | POA: Diagnosis present

## 2020-09-04 DIAGNOSIS — D696 Thrombocytopenia, unspecified: Secondary | ICD-10-CM | POA: Diagnosis present

## 2020-09-04 DIAGNOSIS — I10 Essential (primary) hypertension: Secondary | ICD-10-CM | POA: Diagnosis present

## 2020-09-04 LAB — CBC WITH DIFFERENTIAL/PLATELET
Abs Immature Granulocytes: 0.08 10*3/uL — ABNORMAL HIGH (ref 0.00–0.07)
Basophils Absolute: 0.1 10*3/uL (ref 0.0–0.1)
Basophils Relative: 1 %
Eosinophils Absolute: 0.1 10*3/uL (ref 0.0–0.5)
Eosinophils Relative: 1 %
HCT: 39.2 % (ref 36.0–46.0)
Hemoglobin: 12.9 g/dL (ref 12.0–15.0)
Immature Granulocytes: 1 %
Lymphocytes Relative: 11 %
Lymphs Abs: 1.5 10*3/uL (ref 0.7–4.0)
MCH: 29.3 pg (ref 26.0–34.0)
MCHC: 32.9 g/dL (ref 30.0–36.0)
MCV: 88.9 fL (ref 80.0–100.0)
Monocytes Absolute: 1 10*3/uL (ref 0.1–1.0)
Monocytes Relative: 7 %
Neutro Abs: 11.2 10*3/uL — ABNORMAL HIGH (ref 1.7–7.7)
Neutrophils Relative %: 79 %
Platelets: 143 10*3/uL — ABNORMAL LOW (ref 150–400)
RBC: 4.41 MIL/uL (ref 3.87–5.11)
RDW: 15.5 % (ref 11.5–15.5)
WBC: 13.9 10*3/uL — ABNORMAL HIGH (ref 4.0–10.5)
nRBC: 0 % (ref 0.0–0.2)

## 2020-09-04 LAB — BASIC METABOLIC PANEL
Anion gap: 8 (ref 5–15)
BUN: 20 mg/dL (ref 8–23)
CO2: 20 mmol/L — ABNORMAL LOW (ref 22–32)
Calcium: 8.2 mg/dL — ABNORMAL LOW (ref 8.9–10.3)
Chloride: 109 mmol/L (ref 98–111)
Creatinine, Ser: 1.54 mg/dL — ABNORMAL HIGH (ref 0.44–1.00)
GFR, Estimated: 31 mL/min — ABNORMAL LOW (ref >60)
Glucose, Bld: 111 mg/dL — ABNORMAL HIGH (ref 70–99)
Potassium: 4.8 mmol/L (ref 3.5–5.1)
Sodium: 137 mmol/L (ref 135–145)

## 2020-09-04 LAB — LACTIC ACID, PLASMA: Lactic Acid, Venous: 2.7 mmol/L (ref 0.5–1.9)

## 2020-09-04 MED ORDER — SODIUM CHLORIDE 0.9 % IV BOLUS
500.0000 mL | Freq: Once | INTRAVENOUS | Status: AC
  Administered 2020-09-04: 21:00:00 500 mL via INTRAVENOUS

## 2020-09-04 MED ORDER — SODIUM CHLORIDE 0.9 % IV SOLN
1.0000 g | Freq: Once | INTRAVENOUS | Status: AC
  Administered 2020-09-04: 1 g via INTRAVENOUS
  Filled 2020-09-04: qty 10

## 2020-09-04 MED ORDER — SODIUM CHLORIDE 0.9 % IV SOLN
500.0000 mg | Freq: Once | INTRAVENOUS | Status: AC
  Administered 2020-09-05: 01:00:00 500 mg via INTRAVENOUS
  Filled 2020-09-04: qty 500

## 2020-09-04 MED ORDER — SODIUM CHLORIDE 0.9 % IV BOLUS
1000.0000 mL | Freq: Once | INTRAVENOUS | Status: AC
  Administered 2020-09-04: 1000 mL via INTRAVENOUS

## 2020-09-04 NOTE — ED Notes (Signed)
Patient transported to X-ray 

## 2020-09-04 NOTE — ED Notes (Signed)
Left arm skin tears cleansed with soap and water, steristrips applied. Nonadhering dressing applied with wound care instructions reviewed.

## 2020-09-04 NOTE — Discharge Instructions (Addendum)
You were seen today for an injury to the left arm.  Your x-rays do not show a fracture.  Keep antibiotic ointment on the skin tear and keep it covered.

## 2020-09-04 NOTE — ED Triage Notes (Signed)
Pt states she accidentally rolled out of bed this morning causing skin tear to left forearm and hematoma. Pt denies any other injuries or pain.

## 2020-09-04 NOTE — ED Triage Notes (Signed)
BIB [facility] with c/o L leg pain. Was seen early this AM for a fall. Hypotensive with EMS.

## 2020-09-04 NOTE — ED Notes (Signed)
Date and time results received: 09/04/20 11:05 PM  Test: Lactic Acid Critical Value: 2.7  Name of Provider Notified: Dr. Charm Barges   Orders Received? Or Actions Taken?: No orders received

## 2020-09-04 NOTE — ED Provider Notes (Signed)
[facility] EMERGENCY DEPARTMENT Provider Note   CSN: [id number] Arrival date & time: 09/04/20  2032     History Chief Complaint  Patient presents with   Leg Pain    Lori Bishop is a 87 y.o. female.  She was seen here earlier this morning for a fall out of bed in which she injured her left forearm.  The area was Steri-Stripped and she was discharged home.  She is presenting again by ambulance with complaint of left knee pain.  She says its arthritis acting up.  She does not know if she injured it during the fall.  EMS noted her to be hypotensive.  She denies any dizziness lightheadedness headache neck pain chest pain shortness of breath abdominal pain.  She feels she has been eating and drinking well.  The history is provided by the patient.  Leg Pain Location:  Knee Time since incident:  18 hours Knee location:  L knee Pain details:    Quality:  Aching Associated symptoms: no back pain and no fever       Past Medical History:  Diagnosis Date   Bilateral chronic knee pain    Carpal tunnel syndrome, bilateral    Choledocholithiasis 3 2014 and 5 2014, Dec 2014   S/P ERCP and sphincterotomy x3   Chronic back pain    Chronic kidney disease    GERD (gastroesophageal reflux disease)    Gout    Hypertension    Leg cramps    Osteoarthritis    Spinal stenosis     Patient Active Problem List   Diagnosis Date Noted   Urinary incontinence 05/03/2020   Vaginal odor 03/15/2020   Incomplete uterovaginal prolapse 03/15/2020   Pessary maintenance, Milex ring with support #4, original fit: 07/2017 03/15/2020   Weakness    Hyperkalemia    GERD (gastroesophageal reflux disease) 07/30/2017   Fall 07/30/2017   Chronic diastolic CHF (congestive heart failure) (HCC) 07/30/2017   CAP (community acquired pneumonia) 06/13/2015   Left knee pain 04/26/2015   Right knee pain 04/26/2015   Right upper quadrant pain    Calculus of bile duct with acute cholangitis with obstruction     Choledocholithiasis with cholecystitis    Severe carpal tunnel syndrome 02/02/2014   Hand weakness 02/02/2014   Unspecified hereditary and idiopathic peripheral neuropathy 10/30/2013   Choledocholithiasis 04/17/2013   Abdominal pain 02/14/2013   Acute pancreatitis 02/14/2013   Elevated LFTs 02/14/2013   Hepatic steatosis 02/14/2013   Hyperbilirubinemia 02/14/2013   Leukocytosis 02/14/2013   Osteoarthritis 02/14/2013   RUQ pain 07/07/2012   VIRAL URI 02/17/2008   Chronic pain of left knee 06/09/2007   PLEURISY 09/03/2006   LIVER FUNCTION TESTS, ABNORMAL 09/03/2006   Hypokalemia 06/14/2006   CONSTIPATION 05/16/2006   NUMBNESS, HAND 05/16/2006   Hypoxemia 04/17/2006   HYPERLIPIDEMIA 01/04/2006   GOUT 01/04/2006   CARPAL TUNNEL SYNDROME 01/04/2006   CATARACT NOS 01/04/2006   Essential hypertension 01/04/2006   BRONCHITIS NOS 01/04/2006   Arthropathy 01/04/2006   SPINAL STENOSIS 01/04/2006   OSTEOPOROSIS 01/04/2006    Past Surgical History:  Procedure Laterality Date   APPENDECTOMY     BALLOON DILATION  05/23/2012   Procedure: AMPULLARY BALLOON DILATION;  Surgeon: Corbin Ade, MD;  Location: [facility];  Service: Endoscopy;;   BALLOON DILATION N/A 07/24/2012   Procedure: BALLOON DILATION; Balloon basket dredge and balloon dialate sphincterotomy;  Surgeon: Corbin Ade, MD;  Location: [facility];  Service: Endoscopy;  Laterality: N/A;  BALLOON DILATION N/A 02/17/2013   Procedure: BALLOON DILATION;  Surgeon: Corbin Ade, MD;  Location: [facility];  Service: Endoscopy;  Laterality: N/A;   BILIARY STENT PLACEMENT  05/23/2012   Procedure: BILIARY STENT PLACEMENT;  Surgeon: Corbin Ade, MD;  Location: [facility];  Service: Endoscopy;;   BREAST BIOPSY Left    CARPAL TUNNEL RELEASE Right    CATARACT EXTRACTION Bilateral    CHOLECYSTECTOMY     CHOLEDOCHOENTEROSTOMY N/A 11/09/2014   Procedure: CHOLEDOCHODUODENOSTOMY;  Surgeon: Franky Macho, MD;  Location: [facility];  Service: General;   Laterality: N/A;   ERCP  05/23/2012   RMR: juxta-ampullary duodenal diverticulum, markedly dilated biliary tree with choledocholithiasis, s/p sphincterotomy with biliary sphincterotomy balloon dilation, balloon and basket stone extraction, stent placement   ERCP N/A 07/24/2012   WUJ:[id number] duodenal diverticulum/s/p dilation sphincterotomy site s/p basket stone extraction s/p cholangioscopy with holmium lithotripsy of common duct stones   ERCP N/A 02/17/2013   Dr. Jena Gauss: markedly dilated biliary tree with choledocholithiasis, s/p sphincterotomy and extraction   HOLMIUM LASER APPLICATION N/A 07/24/2012   Procedure: HOLMIUM LASER APPLICATION;  Surgeon: Corbin Ade, MD;  Location: [facility];  Service: Endoscopy;  Laterality: N/A;   REMOVAL OF STONES  05/23/2012   Procedure: BALLOON AND BASKET REMOVAL OF STONES;  Surgeon: Corbin Ade, MD;  Location: [facility];  Service: Endoscopy;;   REMOVAL OF STONES N/A 07/24/2012   Procedure: REMOVAL OF STONES;  Surgeon: Corbin Ade, MD;  Location: [facility];  Service: Endoscopy;  Laterality: N/A;   right femur     metal plate   SPHINCTEROTOMY  05/23/2012   Procedure: SPHINCTEROTOMY;  Surgeon: Corbin Ade, MD;  Location: [facility];  Service: Endoscopy;;   SPYGLASS CHOLANGIOSCOPY N/A 07/24/2012   Procedure: HYQMVHQI CHOLANGIOSCOPY;  Surgeon: Corbin Ade, MD;  Location: [facility];  Service: Endoscopy;  Laterality: N/A;   SPYGLASS LITHOTRIPSY N/A 07/24/2012   Procedure: ONGEXBMW LITHOTRIPSY;  Surgeon: Corbin Ade, MD;  Location: [facility];  Service: Endoscopy;  Laterality: N/A;   STONE EXTRACTION WITH BASKET N/A 02/17/2013   Procedure: STONE EXTRACTION WITH BASKET and BALLOON;  Surgeon: Corbin Ade, MD;  Location: [facility];  Service: Endoscopy;  Laterality: N/A;   WRIST ARTHROPLASTY Right      OB History     Gravida  1   Para  1   Term  1   Preterm      AB      Living  0      SAB      IAB      Ectopic      Multiple      Live Births               Family History  Problem Relation Age of Onset   CAD Mother    Stroke Father    Cancer Son    Colon cancer Neg Hx     Social History   Tobacco Use   Smoking status: Former    Packs/day: 0.25    Years: 15.00    Pack years: 3.75    Types: Cigarettes    Quit date: 07/17/1988    Years since quitting: 32.1   Smokeless tobacco: Never  Vaping Use   Vaping Use: Never used  Substance Use Topics   Alcohol use: No   Drug use: No    Home Medications Prior to Admission medications   Medication Sig Start Date End Date Taking? Authorizing Provider  allopurinol (ZYLOPRIM) 100 MG tablet Take 100 mg by mouth daily.    [provider]  cholecalciferol (VITAMIN D) 1000 units tablet Take 1,000 Units by mouth once a week.     [provider]  felodipine (PLENDIL) 10 MG 24 hr tablet Take 10 mg by mouth daily.    [provider]  furosemide (LASIX) 40 MG tablet Take 1 tablet (40 mg total) by mouth daily as needed for fluid or edema. 08/01/17   Vassie Loll, MD  gabapentin (NEURONTIN) 100 MG capsule Take 100 mg by mouth 3 (three) times daily.    [provider]  ibuprofen (ADVIL,MOTRIN) 200 MG tablet Take 200 mg by mouth every 6 (six) hours as needed.    [provider]  lisinopril (PRINIVIL,ZESTRIL) 10 MG tablet Take 10 mg by mouth daily.  07/01/17   [provider]  metroNIDAZOLE (METROGEL VAGINAL) 0.75 % vaginal gel Use 1 applicator  in vagina at bedtime for 5 nights then has needed 03/15/20   Adline Potter, NP  Misc. Devices (RING PESSARY/SUPPORT) MISC Place in vagina and keep in place Milex ring with support #4 07/16/17   [name], MD  pantoprazole (PROTONIX) 40 MG tablet Take 1 tablet (40 mg total) by mouth daily before breakfast. 06/16/15   Avon Gully, MD  polyethylene glycol (MIRALAX / GLYCOLAX) packet Take 17 g by mouth daily as needed for moderate constipation.    [provider]  potassium chloride SA  (K-DUR,KLOR-CON) 20 MEQ tablet Take 20 mEq by mouth daily.  04/07/18   [provider]  traMADol (ULTRAM) 50 MG tablet Take 1 tablet (50 mg total) by mouth every 8 (eight) hours as needed. 08/01/17   Vassie Loll, MD    Allergies    Patient has no known allergies.  Review of Systems   Review of Systems  Constitutional:  Negative for fever.  HENT:  Negative for sore throat.   Eyes:  Negative for visual disturbance.  Respiratory:  Negative for shortness of breath.   Cardiovascular:  Negative for chest pain.  Gastrointestinal:  Negative for abdominal pain.  Genitourinary:  Negative for dysuria.  Musculoskeletal:  Negative for back pain.  Skin:  Negative for rash.  Neurological:  Negative for headaches.   Physical Exam Updated Vital Signs BP (!) 80/48   Pulse 86   Temp 98 F (36.7 C)   Resp 18   Ht 5' (1.524 m)   Wt 54.4 kg   SpO2 90%   BMI 23.42 kg/m   Physical Exam Vitals and nursing note reviewed.  Constitutional:      General: She is not in acute distress.    Appearance: Normal appearance. She is well-developed.  HENT:     Head: Normocephalic and atraumatic.  Eyes:     Conjunctiva/sclera: Conjunctivae normal.  Cardiovascular:     Rate and Rhythm: Normal rate and regular rhythm.     Heart sounds: No murmur heard. Pulmonary:     Effort: Pulmonary effort is normal. No respiratory distress.     Breath sounds: Normal breath sounds.  Abdominal:     Palpations: Abdomen is soft.     Tenderness: There is no abdominal tenderness.  Musculoskeletal:        General: Tenderness present. Normal range of motion.     Cervical back: Neck supple.     Comments: She has some bruised tenderness in the area that Steri-Stripped on her left forearm.  Compartments are soft.  She also has  some vague tenderness about her left knee.  No bruising appreciated.  Skin:    General: Skin is warm and dry.  Neurological:     General: No focal deficit present.     Mental Status: She is  alert. Mental status is at baseline.     Sensory: No sensory deficit.     Motor: No weakness.    ED Results / Procedures / Treatments   Labs (all labs ordered are listed, but only abnormal results are displayed) Labs Reviewed  BASIC METABOLIC PANEL - Abnormal; Notable for the following components:      Result Value   CO2 20 (*)    Glucose, Bld 111 (*)    Creatinine, Ser 1.54 (*)    Calcium 8.2 (*)    GFR, Estimated 31 (*)    All other components within normal limits  CBC WITH DIFFERENTIAL/PLATELET - Abnormal; Notable for the following components:   WBC 13.9 (*)    Platelets 143 (*)    Neutro Abs 11.2 (*)    Abs Immature Granulocytes 0.08 (*)    All other components within normal limits  LACTIC ACID, PLASMA - Abnormal; Notable for the following components:   Lactic Acid, Venous 2.7 (*)    All other components within normal limits  LACTIC ACID, PLASMA - Abnormal; Notable for the following components:   Lactic Acid, Venous 3.1 (*)    All other components within normal limits  URINALYSIS, ROUTINE W REFLEX MICROSCOPIC - Abnormal; Notable for the following components:   Specific Gravity, Urine 1.034 (*)    pH 9.0 (*)    All other components within normal limits  COMPREHENSIVE METABOLIC PANEL - Abnormal; Notable for the following components:   CO2 21 (*)    Glucose, Bld 101 (*)    Creatinine, Ser 1.55 (*)    Calcium 8.0 (*)    Total Protein 6.2 (*)    Albumin 3.1 (*)    AST 43 (*)    GFR, Estimated 30 (*)    All other components within normal limits  CBC - Abnormal; Notable for the following components:   WBC 13.3 (*)    RDW 15.6 (*)    Platelets 133 (*)    All other components within normal limits  LACTIC ACID, PLASMA - Abnormal; Notable for the following components:   Lactic Acid, Venous 2.2 (*)    All other components within normal limits  CULTURE, BLOOD (ROUTINE X 2)  CULTURE, BLOOD (ROUTINE X 2)  RESP PANEL BY RT-PCR (FLU A&B, COVID) ARPGX2  MRSA NEXT GEN BY PCR,  NASAL  EXPECTORATED SPUTUM ASSESSMENT W GRAM STAIN, RFLX TO RESP C  URINE CULTURE  PROTIME-INR  APTT  MAGNESIUM  PHOSPHORUS  PROCALCITONIN  STREP PNEUMONIAE URINARY ANTIGEN  LEGIONELLA PNEUMOPHILA SEROGP 1 UR AG    EKG None  Radiology DG Chest 1 View  Result Date: 09/04/2020 CLINICAL DATA:  Pain after fall. EXAM: CHEST  1 VIEW COMPARISON:  07/30/2017 FINDINGS: Shallow inspiration. Cardiac enlargement. Bilateral perihilar masslike consolidation, greatest on the left. This is new since prior study and probably represents multifocal pneumonia. Follow-up to resolution recommended to exclude underlying mass lesion. Suggestion of a small right pleural effusion. Calcified and tortuous aorta. Thoracolumbar degenerative changes and scoliosis. IMPRESSION: New masslike perihilar infiltrates in the lungs, likely pneumonia. Follow-up to resolution recommended to exclude underlying mass lesions. Electronically Signed   By: Burman Nieves M.D.   On: 09/04/2020 22:26   DG Forearm Left  Result Date: 09/04/2020  CLINICAL DATA:  Status post fall EXAM: LEFT FOREARM - 2 VIEW COMPARISON:  None. FINDINGS: There is no evidence of acute displaced fracture or other focal bone lesions. Degenerative changes of the left wrist. No definite acute injury of the visualized left wrist and left elbow. No definite elbow effusion. Subcutaneus soft tissue edema of the lateral mid forearm. IMPRESSION: No acute displaced fracture or dislocation of the left forearm. If concern for left wrist or left elbow fracture, please obtain dedicated views. Electronically Signed   By: Tish FredericksonMorgane  Naveau M.D.   On: 09/04/2020 04:40   CT Chest Wo Contrast  Result Date: 09/04/2020 CLINICAL DATA:  Pneumonia, effusion or abscess suspected, xray done 87 year old with left leg and flank pain. Fall. Abnormal chest x-ray. EXAM: CT CHEST WITHOUT CONTRAST TECHNIQUE: Multidetector CT imaging of the chest was performed following the standard protocol without  IV contrast. COMPARISON:  Radiograph earlier this day. No prior chest CT available. FINDINGS: Cardiovascular: Moderate aortic atherosclerosis. There is diffuse aortic tortuosity. Curvilinear calcifications in the descending aorta are stable from 2018 abdominal CT and likely represent calcified mural thrombus. Coronary artery calcifications. Borderline cardiomegaly. Dilated central pulmonary artery at 3.4 cm. No pericardial effusion. Mediastinum/Nodes: Assessment for mediastinal and hilar adenopathy is limited in the absence of IV contrast. There is a lobulated left upper lobe perihilar mass that is difficult to delineate from the adjacent hilum. Enlarged 15 mm subcarinal node series 2, image 64. no gross esophageal wall thickening. No suspicious thyroid nodule. Lungs/Pleura: Left upper lobe perihilar masslike opacity. This is contiguous with the hilum centrally making measurements difficult, however representative measurement of 4.3 x 2.7 x 3.7 cm, series 4, image 40 and series 5, image 66. There a few central calcifications. There is surrounding spiculation and ground-glass opacity extending along the fissure towards the apex as well as anterior inferiorly. This causes luminal narrowing of the left upper lobe bronchus. Linear and irregular opacities in the lingula are typical of atelectasis. Small left pleural effusion. Left basilar opacities likely combination of compressive and subsegmental atelectasis. There is left lower lobe bronchial thickening. Small right pleural effusion. Adjacent right lower lobe atelectasis and subsegmental opacity. Right-sided bronchial thickening, severe in the lower lobes with areas of mucous plugging and mucoid impaction. There is mild ground-glass opacity in the dependent and perifissural right upper lobe. Upper Abdomen: Assessed on concurrent abdominal CT, reported separately. Musculoskeletal: T11 vertebral body and pedicles demonstrates heterogeneous trabecular coarsening, also  seen on 2018 abdominal CT. Scoliosis and degenerative change throughout the thoracic spine. Advanced bilateral shoulder osteoarthritis. IMPRESSION: 1. Left upper lobe perihilar masslike opacity measuring 4.3 x 2.7 x 3.7 cm. This is contiguous with the adjacent hilum and causes luminal narrowing of the left upper lobe bronchus. Differential considerations include infection and malignancy. There are occasional central calcifications. Enlarged subcarinal node measuring 15 mm, nonspecific. If tissue sampling is not pursued, recommend close radiologic follow-up after course of treatment. 2. Small left and small right pleural effusions. Bilateral lower lobe bronchial thickening with areas of mucous plugging and mucoid impaction. 3. Aortic atherosclerosis. Coronary artery calcifications. Dilated central pulmonary artery consistent with pulmonary arterial hypertension. 4. Heterogeneous trabecular coarsening of T11 vertebral body and pedicles, also seen on 2018 abdominal CT. This may be secondary to hemangioma or Paget's disease, but is nonspecific. Stability suggests a nonaggressive etiology. Aortic Atherosclerosis (ICD10-I70.0). Electronically Signed   By: Narda RutherfordMelanie  Sanford M.D.   On: 09/04/2020 23:43   DG Knee Complete 4 Views Left  Result Date: 09/05/2020 CLINICAL  DATA:  Knee pain.  Fall. EXAM: LEFT KNEE - COMPLETE 4+ VIEW COMPARISON:  None. FINDINGS: No fracture or dislocation. Advanced tibiofemoral osteoarthritis, more prominent in the lateral compartment. Downsloping of the posterolateral tibia. Mild patellofemoral osteoarthritis. There is a small knee joint effusion. The bones are diffusely under mineralized. Mild soft tissue edema. IMPRESSION: 1. No fracture or dislocation of the left knee. 2. Advanced tibiofemoral osteoarthritis, more prominent in the lateral compartment. Small knee joint effusion. 3. Osteopenia/osteoporosis. Electronically Signed   By: Narda Rutherford M.D.   On: 09/05/2020 00:00   DG Knee  Complete 4 Views Right  Result Date: 09/04/2020 CLINICAL DATA:  Pain after a fall EXAM: RIGHT KNEE - COMPLETE 4+ VIEW COMPARISON:  07/30/2017 FINDINGS: Diffuse bone demineralization. No evidence of acute fracture or dislocation. Bipartite patella. No focal bone lesion or bone destruction. Bone cortex appears intact. No significant effusions. Soft tissues are unremarkable. IMPRESSION: No acute fracture or dislocation. Electronically Signed   By: Burman Nieves M.D.   On: 09/04/2020 22:28   CT Renal Stone Study  Result Date: 09/04/2020 CLINICAL DATA:  Left flank and left leg pain. EXAM: CT ABDOMEN AND PELVIS WITHOUT CONTRAST TECHNIQUE: Multidetector CT imaging of the abdomen and pelvis was performed following the standard protocol without IV contrast. COMPARISON:  Noncontrast CT 02/05/2017 FINDINGS: Lower chest: Assessed on concurrent chest CT, reported separately. Hepatobiliary: Chronic left lobe atrophy, biliary dilatation and pneumobilia. No discrete focal hepatic lesion. Cholecystectomy. Pancreas: Grossly normal. No evidence of peripancreatic inflammation. Spleen: Small in size.  No evidence of focal splenic abnormality. Adrenals/Urinary Tract: Left adrenal thickening without dominant nodule. No right adrenal nodule. There is mild bilateral renal parenchymal thinning. No hydronephrosis or renal calculi. No perinephric edema. Urinary bladder is unremarkable, partially obscured by streak artifact from right hip arthroplasty. Stomach/Bowel: Bowel assessment is limited in the absence of enteric contrast and paucity of intra-abdominal fat. Decompressed stomach. There is no small bowel obstruction or inflammation. Appendectomy. Scattered left colonic diverticulosis without diverticulitis. Transverse colon is redundant. Vascular/Lymphatic: Advanced aortic atherosclerosis. No aneurysm. No obvious abdominopelvic adenopathy, allowing for limitations related to lack of contrast. Reproductive: Pessary in place. Uterus  tentatively visualized deviating into the right pelvis, obscured by streak artifact from right hip arthroplasty. No evidence of adnexal mass, with limited right adnexal assessment. Other: Umbilical hernia contains fat with mild stranding. No bowel involvement. No abdominopelvic ascites. No free air. There is mild bilateral flank soft tissue edema. Musculoskeletal: Right hip arthroplasty. Lumbar scoliosis and degenerative change. No acute osseous abnormalities are seen. IMPRESSION: 1. No renal stones or obstructive uropathy. 2. Umbilical hernia contains fat with mild stranding. No bowel involvement. Recommend correlation for any hernia pain. 3. Post cholecystectomy with chronic pneumobilia and atrophy of the left hepatic lobe. 4. Colonic diverticulosis without diverticulitis. Aortic Atherosclerosis (ICD10-I70.0). Electronically Signed   By: Narda Rutherford M.D.   On: 09/04/2020 23:49   DG Hip Unilat With Pelvis 2-3 Views Left  Result Date: 09/04/2020 CLINICAL DATA:  Left hip pain after a fall. EXAM: DG HIP (WITH OR WITHOUT PELVIS) 2-3V LEFT COMPARISON:  None. FINDINGS: Degenerative changes in the lower lumbar spine and left hip. Previous right hip hemiarthroplasty. Left hip is rotated, limiting evaluation of the femoral neck. No acute fracture or dislocation is identified. No focal bone lesions or bone destruction. Pelvis appears intact. SI joints and symphysis pubis are not displaced. Prominent vascular calcifications. IMPRESSION: No acute bony abnormalities. Degenerative changes in the lower lumbar spine and left hip. Previous right  hip hemiarthroplasty. Electronically Signed   By: Burman Nieves M.D.   On: 09/04/2020 22:25    Procedures .Critical Care  Date/Time: 09/05/2020 9:41 AM Performed by: Terrilee Files, MD Authorized by: Terrilee Files, MD   Critical care provider statement:    Critical care time (minutes):  45   Critical care time was exclusive of:  Separately billable procedures and  treating other patients   Critical care was necessary to treat or prevent imminent or life-threatening deterioration of the following conditions:  Circulatory failure, sepsis, respiratory failure and dehydration   Critical care was time spent personally by me on the following activities:  Discussions with consultants, evaluation of patient's response to treatment, examination of patient, ordering and performing treatments and interventions, ordering and review of laboratory studies, ordering and review of radiographic studies, pulse oximetry, re-evaluation of patient's condition, obtaining history from patient or surrogate, review of old charts and development of treatment plan with patient or surrogate   Medications Ordered in ED Medications  enoxaparin (LOVENOX) injection 30 mg (30 mg Subcutaneous Given 09/05/20 0848)  cefTRIAXone (ROCEPHIN) 1 g in sodium chloride 0.9 % 100 mL IVPB (has no administration in time range)  azithromycin (ZITHROMAX) 500 mg in sodium chloride 0.9 % 250 mL IVPB (has no administration in time range)  dextromethorphan-guaiFENesin (MUCINEX DM) 30-600 MG per 12 hr tablet 1 tablet (1 tablet Oral Given 09/05/20 0848)  acetaminophen (TYLENOL) tablet 650 mg (650 mg Oral Given 09/05/20 0256)  Chlorhexidine Gluconate Cloth 2 % PADS 6 each (6 each Topical Given 09/05/20 0848)  MEDLINE mouth rinse (has no administration in time range)  lactated ringers infusion ( Intravenous New Bag/Given 09/05/20 0756)  sodium chloride 0.9 % bolus 500 mL (0 mLs Intravenous Stopped 09/04/20 2312)  cefTRIAXone (ROCEPHIN) 1 g in sodium chloride 0.9 % 100 mL IVPB (0 g Intravenous Stopped 09/05/20 0033)  azithromycin (ZITHROMAX) 500 mg in sodium chloride 0.9 % 250 mL IVPB ( Intravenous Infusion Verify 09/05/20 0306)  sodium chloride 0.9 % bolus 1,000 mL (0 mLs Intravenous Stopped 09/05/20 0033)  sodium chloride 0.9 % bolus 250 mL (0 mLs Intravenous Stopping Infusion hung by another clincian 09/05/20 0227)  0.9 %  sodium  chloride infusion (0 mLs Intravenous Stopped 09/05/20 0756)    ED Course  I have reviewed the triage vital signs and the nursing notes.  Pertinent labs & imaging results that were available during my care of the patient were reviewed by me and considered in my medical decision making (see chart for details).  Clinical Course as of 09/05/20 1610  Wynelle Link Sep 04, 2020  2341 Patient's bilateral knee x-ray showing arthritic changes.  Left hip x-ray not convincing for fracture.  Chest x-ray shows abnormal pattern of infiltrate.  Have ordered CT chest and CT renal to further isolate source of hypotension. [MB]  2343 Patient's creatinine up from baseline and bicarb down reflecting some element of dehydration.  Lactate is also elevated.  Have ordered her antibiotics for community-acquired pneumonia. [MB]  Mon Sep 05, 2020  0000 Daughter is here now.  She said that she sent the patient back to the hospital because she was too weak to walk to bed.  Her legs Buckling underneath her. [MB]  0013 Last blood pressure recorded was 116 systolic. [MB]  0014 Daughter confirms she is a DNR [MB]  0021 CT Chest Wo Contrast [MB]  0023 Discussed with Dr. Thomes Dinning [facility] hospitalist who will evaluate the patient for admission. [MB]  Clinical Course User Index [MB] Terrilee Files, MD   MDM Rules/Calculators/A&P                         This patient complains of generalized weakness left knee pain; this involves an extensive number of treatment Options and is a complaint that carries with it a high risk of complications and Morbidity. The differential includes sepsis, Sirs, hypovolemia, infection, fracture, dislocation  I ordered, reviewed and interpreted labs, which included CBC with elevated white count, normal hemoglobin, chemistries with low bicarb elevated creatinine reflecting some dehydration, COVID testing negative, lactate elevated will need to be trended, blood cultures and urine culture sent I ordered  medication IV fluids IV antibiotics I ordered imaging studies which included chest x-ray pelvis and right and left knee x-rays, CT chest, CT renal and I independently    visualized and interpreted imaging which showed no acute fractures.  Does have hilar mass versus infiltrate possibly infectious versus neoplastic Additional history obtained from patient's daughter Previous records obtained and reviewed in epic including prior ED visit  I consulted Dr. Thomes Dinning [facility] hospitalist and discussed lab and imaging findings  Critical Interventions: Management of patient's hypotension and probable infection with IV fluids and antibiotics.  After the interventions stated above, I reevaluated the patient and found patient will need to be admitted to the hospital for continued management.  Daughter confirms DNR status   Final Clinical Impression(s) / ED Diagnoses Final diagnoses:  SIRS (systemic inflammatory response syndrome) (HCC)  Community acquired pneumonia, unspecified laterality  Dehydration  Left knee pain, unspecified chronicity    Rx / DC Orders ED Discharge Orders     None        Terrilee Files, MD 09/05/20 0945

## 2020-09-04 NOTE — ED Provider Notes (Signed)
[facility] EMERGENCY DEPARTMENT Provider Note   CSN: [id number] Arrival date & time: 09/04/20  0348     History Chief Complaint  Patient presents with   Lori Bishop is a 87 y.o. female.  HPI     This is a 87 year old female with a history of hypertension, osteoarthritis, CHF who presents following a fall.  Patient reports that she rolled out of her bed.  She denies hitting her head or loss of consciousness.  She hit her left arm on a piece of furniture.  She is only reporting pain in the arm.  She is not on any anticoagulants.  She lives at home with aides.  She required assistance to stand.  At baseline she states she has difficulty walking because of chronic knee pain.  She denies chest pain, shortness of breath, headache, neck pain.  Past Medical History:  Diagnosis Date   Bilateral chronic knee pain    Carpal tunnel syndrome, bilateral    Choledocholithiasis 3 2014 and 5 2014, Dec 2014   S/P ERCP and sphincterotomy x3   Chronic back pain    Chronic kidney disease    GERD (gastroesophageal reflux disease)    Gout    Hypertension    Leg cramps    Osteoarthritis    Spinal stenosis     Patient Active Problem List   Diagnosis Date Noted   Urinary incontinence 05/03/2020   Vaginal odor 03/15/2020   Incomplete uterovaginal prolapse 03/15/2020   Pessary maintenance, Milex ring with support #4, original fit: 07/2017 03/15/2020   Weakness    Hyperkalemia    GERD (gastroesophageal reflux disease) 07/30/2017   Fall 07/30/2017   Chronic diastolic CHF (congestive heart failure) (HCC) 07/30/2017   CAP (community acquired pneumonia) 06/13/2015   Left knee pain 04/26/2015   Right knee pain 04/26/2015   Right upper quadrant pain    Calculus of bile duct with acute cholangitis with obstruction    Choledocholithiasis with cholecystitis    Severe carpal tunnel syndrome 02/02/2014   Hand weakness 02/02/2014   Unspecified hereditary and idiopathic peripheral neuropathy  10/30/2013   Choledocholithiasis 04/17/2013   Abdominal pain 02/14/2013   Acute pancreatitis 02/14/2013   Elevated LFTs 02/14/2013   Hepatic steatosis 02/14/2013   Hyperbilirubinemia 02/14/2013   Leukocytosis 02/14/2013   Osteoarthritis 02/14/2013   RUQ pain 07/07/2012   VIRAL URI 02/17/2008   Chronic pain of left knee 06/09/2007   PLEURISY 09/03/2006   LIVER FUNCTION TESTS, ABNORMAL 09/03/2006   Hypokalemia 06/14/2006   CONSTIPATION 05/16/2006   NUMBNESS, HAND 05/16/2006   Hypoxemia 04/17/2006   HYPERLIPIDEMIA 01/04/2006   GOUT 01/04/2006   CARPAL TUNNEL SYNDROME 01/04/2006   CATARACT NOS 01/04/2006   Essential hypertension 01/04/2006   BRONCHITIS NOS 01/04/2006   Arthropathy 01/04/2006   SPINAL STENOSIS 01/04/2006   OSTEOPOROSIS 01/04/2006    Past Surgical History:  Procedure Laterality Date   APPENDECTOMY     BALLOON DILATION  05/23/2012   Procedure: AMPULLARY BALLOON DILATION;  Surgeon: Corbin Ade, MD;  Location: [facility];  Service: Endoscopy;;   BALLOON DILATION N/A 07/24/2012   Procedure: BALLOON DILATION; Balloon basket dredge and balloon dialate sphincterotomy;  Surgeon: Corbin Ade, MD;  Location: [facility];  Service: Endoscopy;  Laterality: N/A;   BALLOON DILATION N/A 02/17/2013   Procedure: BALLOON DILATION;  Surgeon: Corbin Ade, MD;  Location: [facility];  Service: Endoscopy;  Laterality: N/A;   BILIARY STENT PLACEMENT  05/23/2012   Procedure:  BILIARY STENT PLACEMENT;  Surgeon: Corbin Ade, MD;  Location: [facility];  Service: Endoscopy;;   BREAST BIOPSY Left    CARPAL TUNNEL RELEASE Right    CATARACT EXTRACTION Bilateral    CHOLECYSTECTOMY     CHOLEDOCHOENTEROSTOMY N/A 11/09/2014   Procedure: CHOLEDOCHODUODENOSTOMY;  Surgeon: Franky Macho, MD;  Location: [facility];  Service: General;  Laterality: N/A;   ERCP  05/23/2012   RMR: juxta-ampullary duodenal diverticulum, markedly dilated biliary tree with choledocholithiasis, s/p sphincterotomy with biliary  sphincterotomy balloon dilation, balloon and basket stone extraction, stent placement   ERCP N/A 07/24/2012   WNU:[id number] duodenal diverticulum/s/p dilation sphincterotomy site s/p basket stone extraction s/p cholangioscopy with holmium lithotripsy of common duct stones   ERCP N/A 02/17/2013   Dr. Jena Gauss: markedly dilated biliary tree with choledocholithiasis, s/p sphincterotomy and extraction   HOLMIUM LASER APPLICATION N/A 07/24/2012   Procedure: HOLMIUM LASER APPLICATION;  Surgeon: Corbin Ade, MD;  Location: [facility];  Service: Endoscopy;  Laterality: N/A;   REMOVAL OF STONES  05/23/2012   Procedure: BALLOON AND BASKET REMOVAL OF STONES;  Surgeon: Corbin Ade, MD;  Location: [facility];  Service: Endoscopy;;   REMOVAL OF STONES N/A 07/24/2012   Procedure: REMOVAL OF STONES;  Surgeon: Corbin Ade, MD;  Location: [facility];  Service: Endoscopy;  Laterality: N/A;   right femur     metal plate   SPHINCTEROTOMY  05/23/2012   Procedure: SPHINCTEROTOMY;  Surgeon: Corbin Ade, MD;  Location: [facility];  Service: Endoscopy;;   SPYGLASS CHOLANGIOSCOPY N/A 07/24/2012   Procedure: QVZDGLOV CHOLANGIOSCOPY;  Surgeon: Corbin Ade, MD;  Location: [facility];  Service: Endoscopy;  Laterality: N/A;   SPYGLASS LITHOTRIPSY N/A 07/24/2012   Procedure: FIEPPIRJ LITHOTRIPSY;  Surgeon: Corbin Ade, MD;  Location: [facility];  Service: Endoscopy;  Laterality: N/A;   STONE EXTRACTION WITH BASKET N/A 02/17/2013   Procedure: STONE EXTRACTION WITH BASKET and BALLOON;  Surgeon: Corbin Ade, MD;  Location: [facility];  Service: Endoscopy;  Laterality: N/A;   WRIST ARTHROPLASTY Right      OB History     Gravida  1   Para  1   Term  1   Preterm      AB      Living  0      SAB      IAB      Ectopic      Multiple      Live Births              Family History  Problem Relation Age of Onset   CAD Mother    Stroke Father    Cancer Son    Colon cancer Neg Hx     Social History   Tobacco Use    Smoking status: Former    Packs/day: 0.25    Years: 15.00    Pack years: 3.75    Types: Cigarettes    Quit date: 07/17/1988    Years since quitting: 32.1   Smokeless tobacco: Never  Vaping Use   Vaping Use: Never used  Substance Use Topics   Alcohol use: No   Drug use: No    Home Medications Prior to Admission medications   Medication Sig Start Date End Date Taking? Authorizing Provider  allopurinol (ZYLOPRIM) 100 MG tablet Take 100 mg by mouth daily.    [provider]  cholecalciferol (VITAMIN D) 1000 units tablet Take 1,000 Units by mouth once a week.  [provider]  felodipine (PLENDIL) 10 MG 24 hr tablet Take 10 mg by mouth daily.    [provider]  furosemide (LASIX) 40 MG tablet Take 1 tablet (40 mg total) by mouth daily as needed for fluid or edema. 08/01/17   Vassie Loll, MD  gabapentin (NEURONTIN) 100 MG capsule Take 100 mg by mouth 3 (three) times daily.    [provider]  ibuprofen (ADVIL,MOTRIN) 200 MG tablet Take 200 mg by mouth every 6 (six) hours as needed.    [provider]  lisinopril (PRINIVIL,ZESTRIL) 10 MG tablet Take 10 mg by mouth daily.  07/01/17   [provider]  metroNIDAZOLE (METROGEL VAGINAL) 0.75 % vaginal gel Use 1 applicator  in vagina at bedtime for 5 nights then has needed 03/15/20   Adline Potter, NP  Misc. Devices (RING PESSARY/SUPPORT) MISC Place in vagina and keep in place Milex ring with support #4 07/16/17   [name], MD  pantoprazole (PROTONIX) 40 MG tablet Take 1 tablet (40 mg total) by mouth daily before breakfast. 06/16/15   Avon Gully, MD  polyethylene glycol (MIRALAX / GLYCOLAX) packet Take 17 g by mouth daily as needed for moderate constipation.    [provider]  potassium chloride SA (K-DUR,KLOR-CON) 20 MEQ tablet Take 20 mEq by mouth daily.  04/07/18   [provider]  traMADol (ULTRAM) 50 MG tablet Take 1 tablet (50 mg total) by mouth every 8  (eight) hours as needed. 08/01/17   Vassie Loll, MD    Allergies    Patient has no known allergies.  Review of Systems   Review of Systems  Constitutional:  Negative for fever.  Respiratory:  Negative for shortness of breath.   Cardiovascular:  Negative for chest pain.  Gastrointestinal:  Negative for abdominal pain.  Musculoskeletal:  Negative for back pain, joint swelling and neck pain.       Left arm pain  Skin:  Positive for wound.  Neurological:  Negative for syncope and headaches.  All other systems reviewed and are negative.  Physical Exam Updated Vital Signs BP 139/76   Pulse 86   Temp 98.8 F (37.1 C)   Resp 18   Ht 1.524 m (5')   Wt 54.4 kg   SpO2 94%   BMI 23.42 kg/m   Physical Exam Vitals and nursing note reviewed.  Constitutional:      Appearance: She is well-developed.     Comments: Thin, elderly, nontoxic  HENT:     Head: Normocephalic and atraumatic.     Nose: Nose normal.     Mouth/Throat:     Mouth: Mucous membranes are moist.  Eyes:     Pupils: Pupils are equal, round, and reactive to light.  Neck:     Comments: No midline C-spine tenderness to palpation, step-off, deformity Cardiovascular:     Rate and Rhythm: Normal rate and regular rhythm.     Heart sounds: Normal heart sounds.  Pulmonary:     Effort: Pulmonary effort is normal. No respiratory distress.     Breath sounds: No wheezing.  Chest:     Chest wall: No tenderness.  Abdominal:     General: Bowel sounds are normal.     Palpations: Abdomen is soft.     Tenderness: There is no abdominal tenderness. There is no guarding or rebound.  Musculoskeletal:     Cervical back: Neck supple.     Comments: Swelling and tenderness to the mid left forearm, no  obvious deformity, hematoma noted with overlying skin tear Normal range of motion bilateral hips and knees without obvious deformities  Skin:    General: Skin is warm and dry.     Comments: Skin tear as noted above  Neurological:      Mental Status: She is alert and oriented to person, place, and time.  Psychiatric:        Mood and Affect: Mood normal.    ED Results / Procedures / Treatments   Labs (all labs ordered are listed, but only abnormal results are displayed) Labs Reviewed - No data to display  EKG None  Radiology DG Forearm Left  Result Date: 09/04/2020 CLINICAL DATA:  Status post fall EXAM: LEFT FOREARM - 2 VIEW COMPARISON:  None. FINDINGS: There is no evidence of acute displaced fracture or other focal bone lesions. Degenerative changes of the left wrist. No definite acute injury of the visualized left wrist and left elbow. No definite elbow effusion. Subcutaneus soft tissue edema of the lateral mid forearm. IMPRESSION: No acute displaced fracture or dislocation of the left forearm. If concern for left wrist or left elbow fracture, please obtain dedicated views. Electronically Signed   By: Tish FredericksonMorgane  Naveau M.D.   On: 09/04/2020 04:40    Procedures Procedures   Medications Ordered in ED Medications - No data to display  ED Course  I have reviewed the triage vital signs and the nursing notes.  Pertinent labs & imaging results that were available during my care of the patient were reviewed by me and considered in my medical decision making (see chart for details).    MDM Rules/Calculators/A&P                          Patient presents with an injury to the left forearm.  She states that this is an isolated injury.  She is a good historian despite her age and denies hitting her head or loss of consciousness.  There is no other evidence of injury on physical exam.  She has normal range of motion of the bilateral hips, knees, elbows, wrist.  Skin tear was cleaned and treated.  We will not attempt repair given how friable her skin is.  X-rays were obtained to rule out fracture.  These were independently reviewed by myself.  No fracture noted.  Wound care instructions provided.  Do not feel she warrants further  imaging at this time.  After history, exam, and medical workup I feel the patient has been appropriately medically screened and is safe for discharge home. Pertinent diagnoses were discussed with the patient. Patient was given return precautions.  Final Clinical Impression(s) / ED Diagnoses Final diagnoses:  Hematoma of arm, left, initial encounter  Skin tear of left forearm without complication, initial encounter    Rx / DC Orders ED Discharge Orders     None        Shaquitta Burbridge, Mayer Maskerourtney F, MD 09/04/20 [phone]

## 2020-09-05 DIAGNOSIS — W19XXXA Unspecified fall, initial encounter: Secondary | ICD-10-CM

## 2020-09-05 DIAGNOSIS — I959 Hypotension, unspecified: Secondary | ICD-10-CM | POA: Diagnosis not present

## 2020-09-05 DIAGNOSIS — Z96641 Presence of right artificial hip joint: Secondary | ICD-10-CM | POA: Diagnosis not present

## 2020-09-05 DIAGNOSIS — D72829 Elevated white blood cell count, unspecified: Secondary | ICD-10-CM | POA: Diagnosis not present

## 2020-09-05 DIAGNOSIS — W06XXXA Fall from bed, initial encounter: Secondary | ICD-10-CM | POA: Diagnosis present

## 2020-09-05 DIAGNOSIS — M6281 Muscle weakness (generalized): Secondary | ICD-10-CM | POA: Diagnosis not present

## 2020-09-05 DIAGNOSIS — G8929 Other chronic pain: Secondary | ICD-10-CM | POA: Diagnosis not present

## 2020-09-05 DIAGNOSIS — R279 Unspecified lack of coordination: Secondary | ICD-10-CM | POA: Diagnosis not present

## 2020-09-05 DIAGNOSIS — M47816 Spondylosis without myelopathy or radiculopathy, lumbar region: Secondary | ICD-10-CM | POA: Diagnosis not present

## 2020-09-05 DIAGNOSIS — Z87891 Personal history of nicotine dependence: Secondary | ICD-10-CM | POA: Diagnosis not present

## 2020-09-05 DIAGNOSIS — E872 Acidosis, unspecified: Secondary | ICD-10-CM

## 2020-09-05 DIAGNOSIS — M10019 Idiopathic gout, unspecified shoulder: Secondary | ICD-10-CM

## 2020-09-05 DIAGNOSIS — N179 Acute kidney failure, unspecified: Secondary | ICD-10-CM | POA: Diagnosis not present

## 2020-09-05 DIAGNOSIS — K219 Gastro-esophageal reflux disease without esophagitis: Secondary | ICD-10-CM

## 2020-09-05 DIAGNOSIS — R1310 Dysphagia, unspecified: Secondary | ICD-10-CM | POA: Diagnosis not present

## 2020-09-05 DIAGNOSIS — I5032 Chronic diastolic (congestive) heart failure: Secondary | ICD-10-CM | POA: Diagnosis not present

## 2020-09-05 DIAGNOSIS — J9601 Acute respiratory failure with hypoxia: Secondary | ICD-10-CM | POA: Diagnosis not present

## 2020-09-05 DIAGNOSIS — Z79899 Other long term (current) drug therapy: Secondary | ICD-10-CM | POA: Diagnosis not present

## 2020-09-05 DIAGNOSIS — Z743 Need for continuous supervision: Secondary | ICD-10-CM | POA: Diagnosis not present

## 2020-09-05 DIAGNOSIS — I10 Essential (primary) hypertension: Secondary | ICD-10-CM

## 2020-09-05 DIAGNOSIS — Z66 Do not resuscitate: Secondary | ICD-10-CM | POA: Diagnosis not present

## 2020-09-05 DIAGNOSIS — Z8249 Family history of ischemic heart disease and other diseases of the circulatory system: Secondary | ICD-10-CM | POA: Diagnosis not present

## 2020-09-05 DIAGNOSIS — Z7401 Bed confinement status: Secondary | ICD-10-CM | POA: Diagnosis not present

## 2020-09-05 DIAGNOSIS — D696 Thrombocytopenia, unspecified: Secondary | ICD-10-CM

## 2020-09-05 DIAGNOSIS — I2510 Atherosclerotic heart disease of native coronary artery without angina pectoris: Secondary | ICD-10-CM | POA: Diagnosis not present

## 2020-09-05 DIAGNOSIS — M25562 Pain in left knee: Secondary | ICD-10-CM | POA: Diagnosis not present

## 2020-09-05 DIAGNOSIS — S51812A Laceration without foreign body of left forearm, initial encounter: Secondary | ICD-10-CM | POA: Diagnosis not present

## 2020-09-05 DIAGNOSIS — M1712 Unilateral primary osteoarthritis, left knee: Secondary | ICD-10-CM | POA: Diagnosis not present

## 2020-09-05 DIAGNOSIS — M810 Age-related osteoporosis without current pathological fracture: Secondary | ICD-10-CM | POA: Diagnosis not present

## 2020-09-05 DIAGNOSIS — M109 Gout, unspecified: Secondary | ICD-10-CM | POA: Diagnosis not present

## 2020-09-05 DIAGNOSIS — E860 Dehydration: Secondary | ICD-10-CM | POA: Diagnosis not present

## 2020-09-05 DIAGNOSIS — Y92003 Bedroom of unspecified non-institutional (private) residence as the place of occurrence of the external cause: Secondary | ICD-10-CM | POA: Diagnosis not present

## 2020-09-05 DIAGNOSIS — R404 Transient alteration of awareness: Secondary | ICD-10-CM | POA: Diagnosis not present

## 2020-09-05 DIAGNOSIS — Z823 Family history of stroke: Secondary | ICD-10-CM | POA: Diagnosis not present

## 2020-09-05 DIAGNOSIS — Z20822 Contact with and (suspected) exposure to covid-19: Secondary | ICD-10-CM | POA: Diagnosis not present

## 2020-09-05 DIAGNOSIS — J189 Pneumonia, unspecified organism: Secondary | ICD-10-CM | POA: Diagnosis present

## 2020-09-05 DIAGNOSIS — I110 Hypertensive heart disease with heart failure: Secondary | ICD-10-CM | POA: Diagnosis not present

## 2020-09-05 DIAGNOSIS — R2689 Other abnormalities of gait and mobility: Secondary | ICD-10-CM | POA: Diagnosis not present

## 2020-09-05 LAB — URINALYSIS, ROUTINE W REFLEX MICROSCOPIC
Bilirubin Urine: NEGATIVE
Glucose, UA: NEGATIVE mg/dL
Hgb urine dipstick: NEGATIVE
Ketones, ur: NEGATIVE mg/dL
Leukocytes,Ua: NEGATIVE
Nitrite: NEGATIVE
Protein, ur: NEGATIVE mg/dL
Specific Gravity, Urine: 1.034 — ABNORMAL HIGH (ref 1.005–1.030)
pH: 9 — ABNORMAL HIGH (ref 5.0–8.0)

## 2020-09-05 LAB — MAGNESIUM: Magnesium: 2.1 mg/dL (ref 1.7–2.4)

## 2020-09-05 LAB — COMPREHENSIVE METABOLIC PANEL
ALT: 23 U/L (ref 0–44)
AST: 43 U/L — ABNORMAL HIGH (ref 15–41)
Albumin: 3.1 g/dL — ABNORMAL LOW (ref 3.5–5.0)
Alkaline Phosphatase: 78 U/L (ref 38–126)
Anion gap: 8 (ref 5–15)
BUN: 19 mg/dL (ref 8–23)
CO2: 21 mmol/L — ABNORMAL LOW (ref 22–32)
Calcium: 8 mg/dL — ABNORMAL LOW (ref 8.9–10.3)
Chloride: 110 mmol/L (ref 98–111)
Creatinine, Ser: 1.55 mg/dL — ABNORMAL HIGH (ref 0.44–1.00)
GFR, Estimated: 30 mL/min — ABNORMAL LOW (ref >60)
Glucose, Bld: 101 mg/dL — ABNORMAL HIGH (ref 70–99)
Potassium: 4.7 mmol/L (ref 3.5–5.1)
Sodium: 139 mmol/L (ref 135–145)
Total Bilirubin: 1.1 mg/dL (ref 0.3–1.2)
Total Protein: 6.2 g/dL — ABNORMAL LOW (ref 6.5–8.1)

## 2020-09-05 LAB — PROCALCITONIN: Procalcitonin: 0.18 ng/mL

## 2020-09-05 LAB — LACTIC ACID, PLASMA
Lactic Acid, Venous: 3.1 mmol/L (ref 0.5–1.9)
Lactic Acid, Venous: 2.2 mmol/L (ref 0.5–1.9)

## 2020-09-05 LAB — STREP PNEUMONIAE URINARY ANTIGEN: Strep Pneumo Urinary Antigen: NEGATIVE

## 2020-09-05 LAB — CBC
HCT: 38.7 % (ref 36.0–46.0)
Hemoglobin: 12.6 g/dL (ref 12.0–15.0)
MCH: 28.9 pg (ref 26.0–34.0)
MCHC: 32.6 g/dL (ref 30.0–36.0)
MCV: 88.8 fL (ref 80.0–100.0)
Platelets: 133 10*3/uL — ABNORMAL LOW (ref 150–400)
RBC: 4.36 MIL/uL (ref 3.87–5.11)
RDW: 15.6 % — ABNORMAL HIGH (ref 11.5–15.5)
WBC: 13.3 10*3/uL — ABNORMAL HIGH (ref 4.0–10.5)
nRBC: 0 % (ref 0.0–0.2)

## 2020-09-05 LAB — PROTIME-INR
INR: 1.1 (ref 0.8–1.2)
Prothrombin Time: 14.2 seconds (ref 11.4–15.2)

## 2020-09-05 LAB — MRSA NEXT GEN BY PCR, NASAL: MRSA by PCR Next Gen: NOT DETECTED

## 2020-09-05 LAB — RESP PANEL BY RT-PCR (FLU A&B, COVID) ARPGX2
Influenza A by PCR: NEGATIVE
Influenza B by PCR: NEGATIVE
SARS Coronavirus 2 by RT PCR: NEGATIVE

## 2020-09-05 LAB — APTT: aPTT: 36 seconds (ref 24–36)

## 2020-09-05 LAB — PHOSPHORUS: Phosphorus: 2.9 mg/dL (ref 2.5–4.6)

## 2020-09-05 MED ORDER — SODIUM CHLORIDE 0.9 % IV SOLN
500.0000 mg | INTRAVENOUS | Status: DC
  Administered 2020-09-06 – 2020-09-08 (×3): 500 mg via INTRAVENOUS
  Filled 2020-09-05 (×3): qty 500

## 2020-09-05 MED ORDER — ALLOPURINOL 100 MG PO TABS
100.0000 mg | ORAL_TABLET | Freq: Every day | ORAL | Status: DC
  Administered 2020-09-05 – 2020-09-08 (×4): 100 mg via ORAL
  Filled 2020-09-05 (×4): qty 1

## 2020-09-05 MED ORDER — LACTATED RINGERS IV SOLN: INTRAVENOUS | Status: DC

## 2020-09-05 MED ORDER — ACETAMINOPHEN 325 MG PO TABS
650.0000 mg | ORAL_TABLET | Freq: Four times a day (QID) | ORAL | Status: DC | PRN
  Administered 2020-09-05 – 2020-09-06 (×4): 650 mg via ORAL
  Filled 2020-09-05 (×4): qty 2

## 2020-09-05 MED ORDER — TRAMADOL HCL 50 MG PO TABS
50.0000 mg | ORAL_TABLET | Freq: Two times a day (BID) | ORAL | Status: DC | PRN
  Administered 2020-09-05 – 2020-09-08 (×5): 50 mg via ORAL
  Filled 2020-09-05 (×5): qty 1

## 2020-09-05 MED ORDER — DM-GUAIFENESIN ER 30-600 MG PO TB12
1.0000 | ORAL_TABLET | Freq: Two times a day (BID) | ORAL | Status: DC
  Administered 2020-09-05 – 2020-09-08 (×8): 1 via ORAL
  Filled 2020-09-05 (×8): qty 1

## 2020-09-05 MED ORDER — ORAL CARE MOUTH RINSE
15.0000 mL | Freq: Two times a day (BID) | OROMUCOSAL | Status: DC
  Administered 2020-09-06 – 2020-09-08 (×5): 15 mL via OROMUCOSAL

## 2020-09-05 MED ORDER — SODIUM CHLORIDE 0.9 % IV SOLN: Freq: Once | INTRAVENOUS | Status: AC

## 2020-09-05 MED ORDER — DOCUSATE SODIUM 100 MG PO CAPS
100.0000 mg | ORAL_CAPSULE | Freq: Every day | ORAL | Status: DC
  Administered 2020-09-05 – 2020-09-08 (×4): 100 mg via ORAL
  Filled 2020-09-05 (×4): qty 1

## 2020-09-05 MED ORDER — SODIUM CHLORIDE 0.9 % IV BOLUS
250.0000 mL | Freq: Once | INTRAVENOUS | Status: AC
  Administered 2020-09-05: 250 mL via INTRAVENOUS

## 2020-09-05 MED ORDER — CHLORHEXIDINE GLUCONATE CLOTH 2 % EX PADS
6.0000 | MEDICATED_PAD | Freq: Every day | CUTANEOUS | Status: DC
  Administered 2020-09-05 – 2020-09-08 (×5): 6 via TOPICAL

## 2020-09-05 MED ORDER — ONDANSETRON HCL 4 MG/2ML IJ SOLN
4.0000 mg | Freq: Four times a day (QID) | INTRAMUSCULAR | Status: DC | PRN
  Administered 2020-09-05: 4 mg via INTRAVENOUS
  Filled 2020-09-05: qty 2

## 2020-09-05 MED ORDER — SODIUM CHLORIDE 0.9 % IV SOLN
1.0000 g | INTRAVENOUS | Status: DC
  Administered 2020-09-05 – 2020-09-07 (×3): 1 g via INTRAVENOUS
  Filled 2020-09-05 (×3): qty 10

## 2020-09-05 MED ORDER — PANTOPRAZOLE SODIUM 40 MG PO TBEC
40.0000 mg | DELAYED_RELEASE_TABLET | Freq: Every day | ORAL | Status: DC
  Administered 2020-09-06 – 2020-09-08 (×3): 40 mg via ORAL
  Filled 2020-09-05 (×3): qty 1

## 2020-09-05 MED ORDER — ENOXAPARIN SODIUM 30 MG/0.3ML IJ SOSY
30.0000 mg | PREFILLED_SYRINGE | INTRAMUSCULAR | Status: DC
  Administered 2020-09-05 – 2020-09-08 (×4): 30 mg via SUBCUTANEOUS
  Filled 2020-09-05 (×4): qty 0.3

## 2020-09-05 MED ORDER — GABAPENTIN 100 MG PO CAPS
100.0000 mg | ORAL_CAPSULE | Freq: Three times a day (TID) | ORAL | Status: DC
  Administered 2020-09-05 – 2020-09-08 (×10): 100 mg via ORAL
  Filled 2020-09-05 (×10): qty 1

## 2020-09-05 NOTE — Progress Notes (Signed)
Patient arrived to floor.  Patient was hypotensive.  Notified MD of low blood pressure.  Pressure was 70 systolic.  Another Iv started and 250 cc bolus started.  Md placed order for transfer to ICU.  ICU nurse given report and patient transferred.  Notified niece Bosie Helper of patient transferred, she verbalized understanding.  Patient is alert and responsive during entire transfer.

## 2020-09-05 NOTE — Progress Notes (Signed)
Lori Bishop is a 87 y.o. female with medical history significant for hypertension, gout, osteoarthritis, GERD, chronic diastolic CHF who presents to the emergency department via EMS due to left knee pain with difficulty to ambulate even with assistance.  She was noted to have intermittent hypotension and hypoxemia in the ED with work-up demonstrating a left upper lobe perihilar masslike opacity that is concerning for infection or malignancy.  She is also noted to have AKI.  She has empirically been started on IV Rocephin and azithromycin for treatment of her community-acquired pneumonia. Her BP has improved, but she will continue to remain off her BP meds. She will require further PT evaluation and likely placement prior to discharge.  Discussed with Niece on phone 7/4.  Total care time: 30 minutes.

## 2020-09-05 NOTE — Progress Notes (Signed)
Date and time results received: 09/05/20 8:57 AM    Test: Lactic Acid Critical Value: 2.2  Name of Provider Notified: Sherryll Burger, MD.  Orders Received? Or Actions Taken?: Actions Taken: MD made aware, levels trending down. Will continue to monitor.

## 2020-09-05 NOTE — Progress Notes (Signed)
Lactic Acid Critical at 3.1. MD notified.

## 2020-09-05 NOTE — Progress Notes (Signed)
Report called and given to Congress, nursing staff on Dept. 300. Pt to be transported to room 312 via bed.

## 2020-09-05 NOTE — Progress Notes (Signed)
In need of urine for labs, encouraged patient to urinate. Bladder scanned, 274 in bladder. Patient is trying to urinate on her own. MD contacted for in and & out cath order PRN.

## 2020-09-05 NOTE — Evaluation (Signed)
Occupational Therapy Evaluation Patient Details Name: Lori Bishop MRN: [id number] DOB: 09-23-1920 Today's Date: 09/05/2020    History of Present Illness Lori Bishop is a 87 y.o. female with medical history significant for hypertension, gout, osteoarthritis, GERD, chronic diastolic CHF who presents to the emergency department via EMS due to left knee pain with difficulty to ambulate even with assistance.  Patient was seen yesterday in the morning in the ED after sustaining a fall in which she stated that she rolled out of her bed while sleeping, she denied hitting her head and she was not on any anticoagulant.  She was noted to have sustained left arm hematoma with left forearm skin tear which was Steri-Stripped and was discharged home.   Clinical Impression   Pt agreeable to OT evaluation, redirected required intermittently as pt is worried about her aids. Pt reports she lives alone and her aides check on her 3x/day, unable to give frequency and duration of aid services. Pt hesitant to attempt weightbearing on LLE. Pt did perform sit to stand with mod assist however O2 dropping into mid 60s, quickly returning to 80s and 90s with short rest break and PLB. Recommend SNF on discharge to improve safety and independence in ADLs and functional mobility.     Follow Up Recommendations  SNF    Equipment Recommendations  None recommended by OT       Precautions / Restrictions Precautions Precautions: Fall Restrictions Weight Bearing Restrictions: No      Mobility Bed Mobility Overal bed mobility: Needs Assistance Bed Mobility: Supine to Sit;Sit to Supine     Supine to sit: Min assist;HOB elevated Sit to supine: Min assist        Transfers Overall transfer level: Needs assistance Equipment used: Rolling walker (2 wheeled) Transfers: Sit to/from Stand;Anterior-Posterior Transfer Sit to Stand: Mod assist     Anterior-Posterior transfers: Mod assist (scooting up in bed)    General transfer comment: pt hesistant due to LLE pain        ADL either performed or assessed with clinical judgement   ADL Overall ADL's : Needs assistance/impaired Eating/Feeding: Modified independent;Bed level   Grooming: Wash/dry hands;Wash/dry face;Set up;Bed level               Lower Body Dressing: Maximal assistance;Bed level                       Vision Baseline Vision/History: No visual deficits Patient Visual Report: No change from baseline Vision Assessment?: No apparent visual deficits            Pertinent Vitals/Pain Pain Assessment: Faces Faces Pain Scale: Hurts little more Pain Location: left knee and hip Pain Descriptors / Indicators: Aching;Grimacing;Guarding Pain Intervention(s): Limited activity within patient's tolerance;Monitored during session;Repositioned     Hand Dominance Right   Extremity/Trunk Assessment Upper Extremity Assessment Upper Extremity Assessment: Generalized weakness   Lower Extremity Assessment Lower Extremity Assessment: Defer to PT evaluation   Cervical / Trunk Assessment Cervical / Trunk Assessment: Kyphotic   Communication Communication Communication: No difficulties   Cognition Arousal/Alertness: Awake/alert Behavior During Therapy: WFL for tasks assessed/performed Overall Cognitive Status: Within Functional Limits for tasks assessed                                                Home Living Family/patient expects to be  discharged to:: Private residence Living Arrangements: Alone Available Help at Discharge: Personal care attendant;Available PRN/intermittently Type of Home: House       Home Layout: One level         Bathroom Toilet: Standard     Home Equipment: Environmental consultant - 2 wheels          Prior Functioning/Environment Level of Independence: Needs assistance  Gait / Transfers Assistance Needed: Pt uses RW for mobility ADL's / Homemaking Assistance Needed: Pt has  aides to assist with ADLs, unable to report frequency and duration of care            OT Problem List: Decreased strength;Decreased activity tolerance;Impaired balance (sitting and/or standing);Decreased safety awareness;Decreased knowledge of use of DME or AE;Pain;Cardiopulmonary status limiting activity      OT Treatment/Interventions: Self-care/ADL training;Therapeutic exercise;DME and/or AE instruction;Therapeutic activities;Patient/family education    OT Goals(Current goals can be found in the care plan section) Acute Rehab OT Goals Patient Stated Goal: to return home OT Goal Formulation: With patient Time For Goal Achievement: 09/19/20 Potential to Achieve Goals: Good  OT Frequency: Min 2X/week   Barriers to D/C: Decreased caregiver support                End of Session Equipment Utilized During Treatment: Gait belt;Rolling walker;Oxygen  Activity Tolerance: Patient limited by pain Patient left: in bed;with call bell/phone within reach;with bed alarm set  OT Visit Diagnosis: Repeated falls (R29.6);Muscle weakness (generalized) (M62.81);Pain Pain - Right/Left: Left Pain - part of body: Hip;Knee                Time: 6015-6153 OT Time Calculation (min): 25 min Charges:  OT General Charges $OT Visit: 1 Visit OT Evaluation $OT Eval Low Complexity: 1 Low   Ezra Sites, OTR/L  [phone] 09/05/2020, 1:33 PM

## 2020-09-05 NOTE — H&P (Signed)
History and Physical  Lori Bishop:[id number] DOB: [birth year] DOA: 09/04/2020  Referring physician: Terrilee Files, MD  PCP: Avon Gully, MD  Patient coming from: Home  Chief Complaint:   HPI: Lori Bishop is a 87 y.o. female with medical history significant for hypertension, gout, osteoarthritis, GERD, chronic diastolic CHF who presents to the emergency department via EMS due to left knee pain with difficulty to ambulate even with assistance.  Patient was seen yesterday in the morning in the ED after sustaining a fall in which she stated that she rolled out of her bed while sleeping, she denied hitting her head and she was not on any anticoagulant.  She was noted to have sustained left arm hematoma with left forearm skin tear which was Steri-Stripped and was discharged home.  Daughter at bedside states that patient continued to complain of left knee pain with difficulty in being able to ambulate with assistance.  EMS was activated, and on arrival of EMS, patient was noted to be hypotensive, she denies lightheadedness, headache, neck pain, dizziness.  She was reported to have vomited once prior to arrival of EMS (vomitus was bilious in nature and nonbloody).  Patient lives alone, has aides that helps to provide care.  She required assistance to stand and has some difficulty ambulating with a walker due to chronic knee pain at baseline.  ED Course:  In the emergency department, she was hypotensive with a BP of 86/52 and she was also noted to be intermittently hypoxic with O2 sat dipping down to 86-88%.  Supplemental oxygen via [place] at 2 LPM was provided with improved O2 sats in the 90s.  Work-up in the ED showed leukocytosis, BUN/creatinine 20/1.54 (creatinine was 1.07 on 03/16/2020).  Lactic acid 2.7.   CT chest without contrast showed left upper lobe perihilar masslike opacity measuring 4.3 x 2.7 x 3.7 cm, suspected to be due to infection/malignancy.  Nonspecific enlarged subcarinal node  measuring 15 mm was noted.   Small left and small right pleural effusions and bilateral lower lobe bronchial thickening with areas of mucous plugging and mucoid impaction were also noted. Other images were done without any noted fractures Patient was empirically treated with IV ceftriaxone and azithromycin, IV hydration was provided.  Hospitalist was asked to admit patient for further evaluation and management.  Review of Systems: Constitutional: Negative for chills and fever.  HENT: Negative for ear pain and sore throat.   Eyes: Negative for pain and visual disturbance.  Respiratory: Positive for shortness of breath.  Negative for cough or chest tightness  Cardiovascular: Positive for low blood pressure.  Negative for chest pain and palpitations.  Gastrointestinal: Negative for abdominal pain and vomiting.  Endocrine: Negative for polyphagia and polyuria.  Genitourinary: Negative for decreased urine volume, dysuria, enuresis Musculoskeletal: Positive for left knee pain. Skin: Negative for color change and rash.  Allergic/Immunologic: Negative for immunocompromised state.  Neurological: Negative for tremors, syncope, speech difficulty Hematological: Does not bruise/bleed easily.  All other systems reviewed and are negative  Past Medical History:  Diagnosis Date   Bilateral chronic knee pain    Carpal tunnel syndrome, bilateral    Choledocholithiasis 3 2014 and 5 2014, Dec 2014   S/P ERCP and sphincterotomy x3   Chronic back pain    Chronic kidney disease    GERD (gastroesophageal reflux disease)    Gout    Hypertension    Leg cramps    Osteoarthritis    Spinal stenosis    Past Surgical  History:  Procedure Laterality Date   APPENDECTOMY     BALLOON DILATION  05/23/2012   Procedure: AMPULLARY BALLOON DILATION;  Surgeon: Corbin Ade, MD;  Location: [facility];  Service: Endoscopy;;   BALLOON DILATION N/A 07/24/2012   Procedure: BALLOON DILATION; Balloon basket dredge and balloon  dialate sphincterotomy;  Surgeon: Corbin Ade, MD;  Location: [facility];  Service: Endoscopy;  Laterality: N/A;   BALLOON DILATION N/A 02/17/2013   Procedure: BALLOON DILATION;  Surgeon: Corbin Ade, MD;  Location: [facility];  Service: Endoscopy;  Laterality: N/A;   BILIARY STENT PLACEMENT  05/23/2012   Procedure: BILIARY STENT PLACEMENT;  Surgeon: Corbin Ade, MD;  Location: [facility];  Service: Endoscopy;;   BREAST BIOPSY Left    CARPAL TUNNEL RELEASE Right    CATARACT EXTRACTION Bilateral    CHOLECYSTECTOMY     CHOLEDOCHOENTEROSTOMY N/A 11/09/2014   Procedure: CHOLEDOCHODUODENOSTOMY;  Surgeon: Franky Macho, MD;  Location: [facility];  Service: General;  Laterality: N/A;   ERCP  05/23/2012   RMR: juxta-ampullary duodenal diverticulum, markedly dilated biliary tree with choledocholithiasis, s/p sphincterotomy with biliary sphincterotomy balloon dilation, balloon and basket stone extraction, stent placement   ERCP N/A 07/24/2012   [id number] duodenal diverticulum/s/p dilation sphincterotomy site s/p basket stone extraction s/p cholangioscopy with holmium lithotripsy of common duct stones   ERCP N/A 02/17/2013   Dr. Jena Gauss: markedly dilated biliary tree with choledocholithiasis, s/p sphincterotomy and extraction   HOLMIUM LASER APPLICATION N/A 07/24/2012   Procedure: HOLMIUM LASER APPLICATION;  Surgeon: Corbin Ade, MD;  Location: [facility];  Service: Endoscopy;  Laterality: N/A;   REMOVAL OF STONES  05/23/2012   Procedure: BALLOON AND BASKET REMOVAL OF STONES;  Surgeon: Corbin Ade, MD;  Location: [facility];  Service: Endoscopy;;   REMOVAL OF STONES N/A 07/24/2012   Procedure: REMOVAL OF STONES;  Surgeon: Corbin Ade, MD;  Location: [facility];  Service: Endoscopy;  Laterality: N/A;   right femur     metal plate   SPHINCTEROTOMY  05/23/2012   Procedure: SPHINCTEROTOMY;  Surgeon: Corbin Ade, MD;  Location: [facility];  Service: Endoscopy;;   SPYGLASS CHOLANGIOSCOPY N/A 07/24/2012   Procedure:  IZTIWPYK CHOLANGIOSCOPY;  Surgeon: Corbin Ade, MD;  Location: [facility];  Service: Endoscopy;  Laterality: N/A;   SPYGLASS LITHOTRIPSY N/A 07/24/2012   Procedure: DXIPJASN LITHOTRIPSY;  Surgeon: Corbin Ade, MD;  Location: [facility];  Service: Endoscopy;  Laterality: N/A;   STONE EXTRACTION WITH BASKET N/A 02/17/2013   Procedure: STONE EXTRACTION WITH BASKET and BALLOON;  Surgeon: Corbin Ade, MD;  Location: [facility];  Service: Endoscopy;  Laterality: N/A;   WRIST ARTHROPLASTY Right     Social History:  reports that she quit smoking about 32 years ago. Her smoking use included cigarettes. She has a 3.75 pack-year smoking history. She has never used smokeless tobacco. She reports that she does not drink alcohol and does not use drugs.   No Known Allergies  Family History  Problem Relation Age of Onset   CAD Mother    Stroke Father    Cancer Son    Colon cancer Neg Hx      Prior to Admission medications   Medication Sig Start Date End Date Taking? Authorizing Provider  allopurinol (ZYLOPRIM) 100 MG tablet Take 100 mg by mouth daily.    [provider]  cholecalciferol (VITAMIN D) 1000 units tablet Take 1,000 Units by mouth once a week.     [provider]  felodipine (PLENDIL) 10 MG 24 hr tablet Take 10 mg by mouth daily.    [provider]  furosemide (LASIX) 40 MG tablet Take 1 tablet (40 mg total) by mouth daily as needed for fluid or edema. 08/01/17   Vassie Loll, MD  gabapentin (NEURONTIN) 100 MG capsule Take 100 mg by mouth 3 (three) times daily.    [provider]  ibuprofen (ADVIL,MOTRIN) 200 MG tablet Take 200 mg by mouth every 6 (six) hours as needed.    [provider]  lisinopril (PRINIVIL,ZESTRIL) 10 MG tablet Take 10 mg by mouth daily.  07/01/17   [provider]  metroNIDAZOLE (METROGEL VAGINAL) 0.75 % vaginal gel Use 1 applicator  in vagina at bedtime for 5 nights then has needed 03/15/20   Adline Potter, NP   Misc. Devices (RING PESSARY/SUPPORT) MISC Place in vagina and keep in place Milex ring with support #4 07/16/17   [name], MD  pantoprazole (PROTONIX) 40 MG tablet Take 1 tablet (40 mg total) by mouth daily before breakfast. 06/16/15   Avon Gully, MD  polyethylene glycol (MIRALAX / GLYCOLAX) packet Take 17 g by mouth daily as needed for moderate constipation.    [provider]  potassium chloride SA (K-DUR,KLOR-CON) 20 MEQ tablet Take 20 mEq by mouth daily.  04/07/18   [provider]  traMADol (ULTRAM) 50 MG tablet Take 1 tablet (50 mg total) by mouth every 8 (eight) hours as needed. 08/01/17   Vassie Loll, MD    Physical Exam: BP 104/68   Pulse 92   Temp 98.7 F (37.1 C) (Oral)   Resp 20   Ht 5' (1.524 m)   Wt 54.4 kg   SpO2 93%   BMI 23.42 kg/m   General: 87 y.o. year-old female well developed well nourished in no acute distress.  Alert and oriented x3. HEENT: Dry mucous membrane.  NCAT, EOMI Neck: Supple, trachea medial Cardiovascular: Regular rate and rhythm with no rubs or gallops.  2/4 pulses in all 4 extremities. Respiratory: Clear to auscultation with no wheezes or rales. Good inspiratory effort. Abdomen: Soft, nontender nondistended with normal bowel sounds x4 quadrants. Muskuloskeletal: Tender to palpation of left knee.  Tender to palpation of left bruised area. Neuro: CN II-XII intact, strength 5/5 x 4, sensation, reflexes intact Skin: No ulcerative lesions noted or rashes Psychiatry: Mood is appropriate for condition and setting          Labs on Admission:  Basic Metabolic Panel: Recent Labs  Lab 09/04/20 2207  NA 137  K 4.8  CL 109  CO2 20*  GLUCOSE 111*  BUN 20  CREATININE 1.54*  CALCIUM 8.2*   Liver Function Tests: No results for input(s): AST, ALT, ALKPHOS, BILITOT, PROT, ALBUMIN in the last 168 hours. No results for input(s): LIPASE, AMYLASE in the last 168 hours. No results for input(s): AMMONIA in the last 168  hours. CBC: Recent Labs  Lab 09/04/20 2207  WBC 13.9*  NEUTROABS 11.2*  HGB 12.9  HCT 39.2  MCV 88.9  PLT 143*   Cardiac Enzymes: No results for input(s): CKTOTAL, CKMB, CKMBINDEX, TROPONINI in the last 168 hours.  BNP (last 3 results) No results for input(s): BNP in the last 8760 hours.  ProBNP (last 3 results) No results for input(s): PROBNP in the last 8760 hours.  CBG: No results for input(s): GLUCAP in the last 168 hours.  Radiological Exams on Admission: DG Chest 1 View  Result Date: 09/04/2020 CLINICAL  DATA:  Pain after fall. EXAM: CHEST  1 VIEW COMPARISON:  07/30/2017 FINDINGS: Shallow inspiration. Cardiac enlargement. Bilateral perihilar masslike consolidation, greatest on the left. This is new since prior study and probably represents multifocal pneumonia. Follow-up to resolution recommended to exclude underlying mass lesion. Suggestion of a small right pleural effusion. Calcified and tortuous aorta. Thoracolumbar degenerative changes and scoliosis. IMPRESSION: New masslike perihilar infiltrates in the lungs, likely pneumonia. Follow-up to resolution recommended to exclude underlying mass lesions. Electronically Signed   By: Burman NievesWilliam  Stevens M.D.   On: 09/04/2020 22:26   DG Forearm Left  Result Date: 09/04/2020 CLINICAL DATA:  Status post fall EXAM: LEFT FOREARM - 2 VIEW COMPARISON:  None. FINDINGS: There is no evidence of acute displaced fracture or other focal bone lesions. Degenerative changes of the left wrist. No definite acute injury of the visualized left wrist and left elbow. No definite elbow effusion. Subcutaneus soft tissue edema of the lateral mid forearm. IMPRESSION: No acute displaced fracture or dislocation of the left forearm. If concern for left wrist or left elbow fracture, please obtain dedicated views. Electronically Signed   By: Tish FredericksonMorgane  Naveau M.D.   On: 09/04/2020 04:40   CT Chest Wo Contrast  Result Date: 09/04/2020 CLINICAL DATA:  Pneumonia, effusion  or abscess suspected, xray done 87 year old with left leg and flank pain. Fall. Abnormal chest x-ray. EXAM: CT CHEST WITHOUT CONTRAST TECHNIQUE: Multidetector CT imaging of the chest was performed following the standard protocol without IV contrast. COMPARISON:  Radiograph earlier this day. No prior chest CT available. FINDINGS: Cardiovascular: Moderate aortic atherosclerosis. There is diffuse aortic tortuosity. Curvilinear calcifications in the descending aorta are stable from 2018 abdominal CT and likely represent calcified mural thrombus. Coronary artery calcifications. Borderline cardiomegaly. Dilated central pulmonary artery at 3.4 cm. No pericardial effusion. Mediastinum/Nodes: Assessment for mediastinal and hilar adenopathy is limited in the absence of IV contrast. There is a lobulated left upper lobe perihilar mass that is difficult to delineate from the adjacent hilum. Enlarged 15 mm subcarinal node series 2, image 64. no gross esophageal wall thickening. No suspicious thyroid nodule. Lungs/Pleura: Left upper lobe perihilar masslike opacity. This is contiguous with the hilum centrally making measurements difficult, however representative measurement of 4.3 x 2.7 x 3.7 cm, series 4, image 40 and series 5, image 66. There a few central calcifications. There is surrounding spiculation and ground-glass opacity extending along the fissure towards the apex as well as anterior inferiorly. This causes luminal narrowing of the left upper lobe bronchus. Linear and irregular opacities in the lingula are typical of atelectasis. Small left pleural effusion. Left basilar opacities likely combination of compressive and subsegmental atelectasis. There is left lower lobe bronchial thickening. Small right pleural effusion. Adjacent right lower lobe atelectasis and subsegmental opacity. Right-sided bronchial thickening, severe in the lower lobes with areas of mucous plugging and mucoid impaction. There is mild ground-glass  opacity in the dependent and perifissural right upper lobe. Upper Abdomen: Assessed on concurrent abdominal CT, reported separately. Musculoskeletal: T11 vertebral body and pedicles demonstrates heterogeneous trabecular coarsening, also seen on 2018 abdominal CT. Scoliosis and degenerative change throughout the thoracic spine. Advanced bilateral shoulder osteoarthritis. IMPRESSION: 1. Left upper lobe perihilar masslike opacity measuring 4.3 x 2.7 x 3.7 cm. This is contiguous with the adjacent hilum and causes luminal narrowing of the left upper lobe bronchus. Differential considerations include infection and malignancy. There are occasional central calcifications. Enlarged subcarinal node measuring 15 mm, nonspecific. If tissue sampling is not pursued, recommend close radiologic  follow-up after course of treatment. 2. Small left and small right pleural effusions. Bilateral lower lobe bronchial thickening with areas of mucous plugging and mucoid impaction. 3. Aortic atherosclerosis. Coronary artery calcifications. Dilated central pulmonary artery consistent with pulmonary arterial hypertension. 4. Heterogeneous trabecular coarsening of T11 vertebral body and pedicles, also seen on 2018 abdominal CT. This may be secondary to hemangioma or Paget's disease, but is nonspecific. Stability suggests a nonaggressive etiology. Aortic Atherosclerosis (ICD10-I70.0). Electronically Signed   By: Narda Rutherford M.D.   On: 09/04/2020 23:43   DG Knee Complete 4 Views Left  Result Date: 09/05/2020 CLINICAL DATA:  Knee pain.  Fall. EXAM: LEFT KNEE - COMPLETE 4+ VIEW COMPARISON:  None. FINDINGS: No fracture or dislocation. Advanced tibiofemoral osteoarthritis, more prominent in the lateral compartment. Downsloping of the posterolateral tibia. Mild patellofemoral osteoarthritis. There is a small knee joint effusion. The bones are diffusely under mineralized. Mild soft tissue edema. IMPRESSION: 1. No fracture or dislocation of the  left knee. 2. Advanced tibiofemoral osteoarthritis, more prominent in the lateral compartment. Small knee joint effusion. 3. Osteopenia/osteoporosis. Electronically Signed   By: Narda Rutherford M.D.   On: 09/05/2020 00:00   DG Knee Complete 4 Views Right  Result Date: 09/04/2020 CLINICAL DATA:  Pain after a fall EXAM: RIGHT KNEE - COMPLETE 4+ VIEW COMPARISON:  07/30/2017 FINDINGS: Diffuse bone demineralization. No evidence of acute fracture or dislocation. Bipartite patella. No focal bone lesion or bone destruction. Bone cortex appears intact. No significant effusions. Soft tissues are unremarkable. IMPRESSION: No acute fracture or dislocation. Electronically Signed   By: Burman Nieves M.D.   On: 09/04/2020 22:28   CT Renal Stone Study  Result Date: 09/04/2020 CLINICAL DATA:  Left flank and left leg pain. EXAM: CT ABDOMEN AND PELVIS WITHOUT CONTRAST TECHNIQUE: Multidetector CT imaging of the abdomen and pelvis was performed following the standard protocol without IV contrast. COMPARISON:  Noncontrast CT 02/05/2017 FINDINGS: Lower chest: Assessed on concurrent chest CT, reported separately. Hepatobiliary: Chronic left lobe atrophy, biliary dilatation and pneumobilia. No discrete focal hepatic lesion. Cholecystectomy. Pancreas: Grossly normal. No evidence of peripancreatic inflammation. Spleen: Small in size.  No evidence of focal splenic abnormality. Adrenals/Urinary Tract: Left adrenal thickening without dominant nodule. No right adrenal nodule. There is mild bilateral renal parenchymal thinning. No hydronephrosis or renal calculi. No perinephric edema. Urinary bladder is unremarkable, partially obscured by streak artifact from right hip arthroplasty. Stomach/Bowel: Bowel assessment is limited in the absence of enteric contrast and paucity of intra-abdominal fat. Decompressed stomach. There is no small bowel obstruction or inflammation. Appendectomy. Scattered left colonic diverticulosis without  diverticulitis. Transverse colon is redundant. Vascular/Lymphatic: Advanced aortic atherosclerosis. No aneurysm. No obvious abdominopelvic adenopathy, allowing for limitations related to lack of contrast. Reproductive: Pessary in place. Uterus tentatively visualized deviating into the right pelvis, obscured by streak artifact from right hip arthroplasty. No evidence of adnexal mass, with limited right adnexal assessment. Other: Umbilical hernia contains fat with mild stranding. No bowel involvement. No abdominopelvic ascites. No free air. There is mild bilateral flank soft tissue edema. Musculoskeletal: Right hip arthroplasty. Lumbar scoliosis and degenerative change. No acute osseous abnormalities are seen. IMPRESSION: 1. No renal stones or obstructive uropathy. 2. Umbilical hernia contains fat with mild stranding. No bowel involvement. Recommend correlation for any hernia pain. 3. Post cholecystectomy with chronic pneumobilia and atrophy of the left hepatic lobe. 4. Colonic diverticulosis without diverticulitis. Aortic Atherosclerosis (ICD10-I70.0). Electronically Signed   By: Narda Rutherford M.D.   On: 09/04/2020 23:49  DG Hip Unilat With Pelvis 2-3 Views Left  Result Date: 09/04/2020 CLINICAL DATA:  Left hip pain after a fall. EXAM: DG HIP (WITH OR WITHOUT PELVIS) 2-3V LEFT COMPARISON:  None. FINDINGS: Degenerative changes in the lower lumbar spine and left hip. Previous right hip hemiarthroplasty. Left hip is rotated, limiting evaluation of the femoral neck. No acute fracture or dislocation is identified. No focal bone lesions or bone destruction. Pelvis appears intact. SI joints and symphysis pubis are not displaced. Prominent vascular calcifications. IMPRESSION: No acute bony abnormalities. Degenerative changes in the lower lumbar spine and left hip. Previous right hip hemiarthroplasty. Electronically Signed   By: Burman Nieves M.D.   On: 09/04/2020 22:25    EKG: I independently viewed the EKG done  and my findings are as followed: EKG was not done in the ED  Assessment/Plan Present on Admission:  Acute respiratory failure with hypoxia (HCC)  CAP (community acquired pneumonia)  Leukocytosis  Fall  Left knee pain  Essential hypertension  GERD (gastroesophageal reflux disease)  Chronic diastolic CHF (congestive heart failure) (HCC)  GOUT  Osteoarthritis  Principal Problem:   CAP (community acquired pneumonia) Active Problems:   GOUT   Essential hypertension   Leukocytosis   Osteoarthritis   Left knee pain   GERD (gastroesophageal reflux disease)   Fall   Chronic diastolic CHF (congestive heart failure) (HCC)   Acute respiratory failure with hypoxia (HCC)   Lactic acidosis   Thrombocytopenia (HCC)  Acute respiratory failure with hypoxia secondary to possible CAP POA CT chest without contrast showed left upper lobe perihilar masslike opacity measuring 4.3 x 2.7 x 3.7 cm, suspected to be due to infection/malignancy. Patient was empirically started on IV ceftriaxone and azithromycin, we shall continue with same at this time with plan to de-escalate/discontinue based on blood culture, sputum culture, urine Legionella, strep pneumo and procalcitonin PORT/PSI of 127points  indicating 8.2-9.3% mortality Continue Mucinex, incentive spirometry, flutter valve  Continue supplemental oxygen via [place] to maintain O2 sats > 82% with plan to wean patient off oxygen as tolerated (patient does not use oxygen at baseline). A brief phone consult with PCCM (Dr. Jayme Cloud) regarding bilateral lower lobe bronchial thickening with areas of mucous plugging and mucoid impaction noted on CT chest resulted in reassurance that patient can stay here and be treated for pneumonia, with possible need for outpatient EBUS based on imaging findings after initial pneumonia treatment.  Leukocytosis possibly secondary to infectious process vs reactive WBC 13.9, continue treatment as described above  Lactic  acidosis Lactic acid 2.7, continue to monitor lactic acid  Acute kidney injury/dehydration BUN/creatinine 20/1.54 (creatinine was 1.07 on 03/16/2020) Renally adjust medications, avoid nephrotoxic agents/dehydration/hypotension  Thrombocytopenia Platelets 143, continue to monitor platelet levels with morning labs  Left knee pain secondary to accidental fall Osteoarthritis Continue Tylenol as needed Continue fall precaution and neurochecks Continue PT/OT eval and treat  Hypotension IV hydration was provided Patient admitted to ICU with plan to start pressors if patient does not respond to IV fluid  Essential hypertension Hold off BP meds at this time due to hypotension  History of diastolic CHF Hold Lasix, Lisinopril at this time due to hypotension  GERD Continue Protonix  Gout Allopurinol will be temporarily held due to acute kidney injury   DVT prophylaxis: Lovenox  Code Status: DNR  Family Communication: Niece at bedside (all questions answered to satisfaction)  Disposition Plan:  Patient is from:  home Anticipated DC to:                   SNF or family members home Anticipated DC date:               2-3 days Anticipated DC barriers:          Patient requires inpatient management due to hypoxic respiratory failure secondary to CAP requiring inpatient antibiotic management    Consults called: None  Admission status: Inpatient    Frankey Shown MD [facility]  09/05/2020, 2:05 AM

## 2020-09-05 NOTE — Plan of Care (Signed)
  Problem: Acute Rehab OT Goals (only OT should resolve) Goal: Pt. Will Perform Eating Flowsheets (Taken 09/05/2020 1336) Pt Will Perform Eating:  with modified independence  sitting Goal: Pt. Will Perform Grooming Flowsheets (Taken 09/05/2020 1336) Pt Will Perform Grooming:  with supervision  sitting  standing Goal: Pt. Will Perform Lower Body Dressing Flowsheets (Taken 09/05/2020 1336) Pt Will Perform Lower Body Dressing:  with min assist  sitting/lateral leans  sit to/from stand Goal: Pt. Will Transfer To Toilet Flowsheets (Taken 09/05/2020 1336) Pt Will Transfer to Toilet:  with min guard assist  stand pivot transfer  bedside commode  ambulating  regular height toilet Goal: Pt. Will Perform Toileting-Clothing Manipulation Flowsheets (Taken 09/05/2020 1336) Pt Will Perform Toileting - Clothing Manipulation and hygiene:  with supervision  sitting/lateral leans Goal: Pt/Caregiver Will Perform Home Exercise Program Flowsheets (Taken 09/05/2020 1336) Pt/caregiver will Perform Home Exercise Program:  Increased strength  Both right and left upper extremity  Independently  With written HEP provided

## 2020-09-06 LAB — CBC
HCT: 35.5 % — ABNORMAL LOW (ref 36.0–46.0)
Hemoglobin: 11.6 g/dL — ABNORMAL LOW (ref 12.0–15.0)
MCH: 28.9 pg (ref 26.0–34.0)
MCHC: 32.7 g/dL (ref 30.0–36.0)
MCV: 88.3 fL (ref 80.0–100.0)
Platelets: 132 10*3/uL — ABNORMAL LOW (ref 150–400)
RBC: 4.02 MIL/uL (ref 3.87–5.11)
RDW: 15.9 % — ABNORMAL HIGH (ref 11.5–15.5)
WBC: 13.8 10*3/uL — ABNORMAL HIGH (ref 4.0–10.5)
nRBC: 0 % (ref 0.0–0.2)

## 2020-09-06 LAB — COMPREHENSIVE METABOLIC PANEL
ALT: 19 U/L (ref 0–44)
AST: 34 U/L (ref 15–41)
Albumin: 3 g/dL — ABNORMAL LOW (ref 3.5–5.0)
Alkaline Phosphatase: 69 U/L (ref 38–126)
Anion gap: 8 (ref 5–15)
BUN: 21 mg/dL (ref 8–23)
CO2: 22 mmol/L (ref 22–32)
Calcium: 8.3 mg/dL — ABNORMAL LOW (ref 8.9–10.3)
Chloride: 108 mmol/L (ref 98–111)
Creatinine, Ser: 1.4 mg/dL — ABNORMAL HIGH (ref 0.44–1.00)
GFR, Estimated: 34 mL/min — ABNORMAL LOW (ref >60)
Glucose, Bld: 102 mg/dL — ABNORMAL HIGH (ref 70–99)
Potassium: 4.5 mmol/L (ref 3.5–5.1)
Sodium: 138 mmol/L (ref 135–145)
Total Bilirubin: 0.8 mg/dL (ref 0.3–1.2)
Total Protein: 6 g/dL — ABNORMAL LOW (ref 6.5–8.1)

## 2020-09-06 LAB — LACTIC ACID, PLASMA
Lactic Acid, Venous: 2.4 mmol/L (ref 0.5–1.9)
Lactic Acid, Venous: 2.2 mmol/L (ref 0.5–1.9)
Lactic Acid, Venous: 2.8 mmol/L (ref 0.5–1.9)

## 2020-09-06 LAB — URINE CULTURE: Culture: <10000 — AB

## 2020-09-06 LAB — GLUCOSE, CAPILLARY
Glucose-Capillary: 92 mg/dL (ref 70–99)
Glucose-Capillary: 86 mg/dL (ref 70–99)

## 2020-09-06 LAB — LEGIONELLA PNEUMOPHILA SEROGP 1 UR AG: L. pneumophila Serogp 1 Ur Ag: NEGATIVE

## 2020-09-06 LAB — MAGNESIUM: Magnesium: 2.1 mg/dL (ref 1.7–2.4)

## 2020-09-06 MED ORDER — OXYCODONE HCL 5 MG PO TABS
5.0000 mg | ORAL_TABLET | Freq: Four times a day (QID) | ORAL | Status: DC | PRN
  Administered 2020-09-06 – 2020-09-08 (×6): 5 mg via ORAL
  Filled 2020-09-06 (×6): qty 1

## 2020-09-06 NOTE — Progress Notes (Signed)
Occupational Therapy Treatment Patient Details Name: Lori Bishop MRN: [id number] DOB: 1920/04/19 Today's Date: 09/06/2020    History of present illness Lori Bishop is a 87 y.o. female with medical history significant for hypertension, gout, osteoarthritis, GERD, chronic diastolic CHF who presents to the emergency department via EMS due to left knee pain with difficulty to ambulate even with assistance.  Patient was seen yesterday in the morning in the ED after sustaining a fall in which she stated that she rolled out of her bed while sleeping, she denied hitting her head and she was not on any anticoagulant.  She was noted to have sustained left arm hematoma with left forearm skin tear which was Steri-Stripped and was discharged home.   OT comments  Pt agreeable to OT treatment but seemed reluctant to attempt stand pivot transfer. Pt noted to have oxygen off when this therapist entered the room. SpO2 levels checked with pt at 87% while in bed with HOB elevated. Pt asked to don the 2 LPM oxygen that has already been in place. Pt levels rose to 90s and stayed above 90 for duration of the session with one brief instance of dipping but likely due to position of sensor. Pt required Mod A for stand pivot transfer to chair using RW. Discharge plan remains appropriate.   Follow Up Recommendations  SNF    Equipment Recommendations  None recommended by OT    Recommendations for Other Services      Precautions / Restrictions Precautions Precautions: Fall Restrictions Weight Bearing Restrictions: No       Mobility Bed Mobility Overal bed mobility: Needs Assistance Bed Mobility: Supine to Sit;Sit to Supine     Supine to sit: Min assist     General bed mobility comments: slow labored movement    Transfers Overall transfer level: Needs assistance Equipment used: Rolling walker (2 wheeled) Transfers: Sit to/from [employer] Sit to Stand: Mod assist Stand pivot  transfers: Mod assist       General transfer comment: Pt reporting she could not put weight on L UE. Pt informed that she did not have any weight bearing precautions. Slow labored movement to complete the transfer from EOB to chair.                                               ADL either performed or assessed with clinical judgement   ADL Overall ADL's : Needs assistance/impaired                         Toilet Transfer: Moderate assistance;Stand-pivot;RW Toilet Transfer Details (indicate cue type and reason): simulated via transfer to chair; extended time needed; pt demonstrates labored movement with very kyphotic posture.                                   Cognition Arousal/Alertness: Awake/alert Behavior During Therapy: WFL for tasks assessed/performed Overall Cognitive Status: Within Functional Limits for tasks assessed  Pertinent Vitals/ Pain       Pain Assessment: Faces Faces Pain Scale: Hurts a little bit Pain Location: left knee and hip Pain Descriptors / Indicators: Guarding Pain Intervention(s): Limited activity within patient's tolerance;Monitored during session;Repositioned                                                          Frequency  Min 2X/week        Progress Toward Goals  OT Goals(current goals can now be found in the care plan section)     Acute Rehab OT Goals Patient Stated Goal: to return home OT Goal Formulation: With patient Time For Goal Achievement: 09/19/20 Potential to Achieve Goals: Good ADL Goals Pt Will Perform Eating: with modified independence;sitting Pt Will Perform Grooming: with supervision;sitting;standing Pt Will Perform Lower Body Dressing: with min assist;sitting/lateral leans;sit to/from stand Pt Will Transfer to Toilet: with min guard assist;stand pivot transfer;bedside  commode;ambulating;regular height toilet Pt Will Perform Toileting - Clothing Manipulation and hygiene: with supervision;sitting/lateral leans Pt/caregiver will Perform Home Exercise Program: Increased strength;Both right and left upper extremity;Independently;With written HEP provided  Plan Discharge plan remains appropriate                                    End of Session Equipment Utilized During Treatment: Gait belt;Rolling walker;Oxygen  OT Visit Diagnosis: Repeated falls (R29.6);Muscle weakness (generalized) (M62.81);Pain Pain - Right/Left: Left Pain - part of body: Hip;Knee   Activity Tolerance Patient tolerated treatment well   Patient Left in chair;with chair alarm set;with call bell/phone within reach;with nursing/sitter in room             Time: 4492-0100 OT Time Calculation (min): 28 min  Charges: OT General Charges $OT Visit: 1 Visit OT Treatments $Self Care/Home Management : 23-37 mins  Tiaria Biby OT, MOT    Danie Chandler 09/06/2020, 10:23 AM

## 2020-09-06 NOTE — Evaluation (Signed)
Physical Therapy Evaluation Patient Details Name: Lori Bishop MRN: [id number] DOB: Dec 28, 1920 Today's Date: 09/06/2020   History of Present Illness  Lori Bishop is a 87 y.o. female with medical history significant for hypertension, gout, osteoarthritis, GERD, chronic diastolic CHF who presents to the emergency department via EMS due to left knee pain with difficulty to ambulate even with assistance.  Patient was seen yesterday in the morning in the ED after sustaining a fall in which she stated that she rolled out of her bed while sleeping, she denied hitting her head and she was not on any anticoagulant.  She was noted to have sustained left arm hematoma with left forearm skin tear which was Steri-Stripped and was discharged home.   Clinical Impression  Patient demonstrates slow labored movement for sitting up at bedside requiring Min assist to complete supine to sitting and scooting to EOB, very unsteady on feet and limited to a few side steps before having to sit due to BLE weakness and poor standing balance.  Patient tolerated sitting up in chair after therapy.  Patient will benefit from continued physical therapy in hospital and recommended venue below to increase strength, balance, endurance for safe ADLs and gait.       Follow Up Recommendations SNF    Equipment Recommendations  None recommended by PT    Recommendations for Other Services       Precautions / Restrictions Precautions Precautions: Fall Restrictions Weight Bearing Restrictions: No      Mobility  Bed Mobility Overal bed mobility: Needs Assistance Bed Mobility: Supine to Sit     Supine to sit: Min assist     General bed mobility comments: increased time, labored movement    Transfers Overall transfer level: Needs assistance Equipment used: Rolling walker (2 wheeled) Transfers: Sit to/from [employer] Sit to Stand: Mod assist Stand pivot transfers: Mod assist       General  transfer comment: unstady on feet, slow labored movement  Ambulation/Gait Ambulation/Gait assistance: Mod assist;Max assist Gait Distance (Feet): 4 Feet Assistive device: Rolling walker (2 wheeled) Gait Pattern/deviations: Decreased step length - right;Decreased step length - left;Decreased stride length Gait velocity: decreased   General Gait Details: limited to 4-5 slow labored unsteady side steps before having to sit due to BLE weakness and poor standing balance  Stairs            Wheelchair Mobility    Modified Rankin (Stroke Patients Only)       Balance Overall balance assessment: Needs assistance Sitting-balance support: Feet supported;No upper extremity supported Sitting balance-Leahy Scale: Fair Sitting balance - Comments: fair/good seated at EOB   Standing balance support: During functional activity;Bilateral upper extremity supported Standing balance-Leahy Scale: Poor Standing balance comment: using RW                             Pertinent Vitals/Pain Pain Assessment: 0-10 Pain Score: 7  Pain Location: left knee Pain Descriptors / Indicators: Sore;Guarding Pain Intervention(s): Limited activity within patient's tolerance;Monitored during session;Repositioned    Home Living Family/patient expects to be discharged to:: Private residence Living Arrangements: Alone Available Help at Discharge: Personal care attendant;Available PRN/intermittently Type of Home: House       Home Layout: One level Home Equipment: Walker - 2 wheels      Prior Function Level of Independence: Needs assistance   Gait / Transfers Assistance Needed: household ambulator using RW  ADL's / Homemaking Assistance Needed:  home aides as needed during day x 7 days/week, "per patient"        Hand Dominance   Dominant Hand: Right    Extremity/Trunk Assessment   Upper Extremity Assessment Upper Extremity Assessment: Defer to OT evaluation    Lower Extremity  Assessment Lower Extremity Assessment: Generalized weakness    Cervical / Trunk Assessment Cervical / Trunk Assessment: Kyphotic  Communication   Communication: No difficulties  Cognition Arousal/Alertness: Awake/alert Behavior During Therapy: WFL for tasks assessed/performed Overall Cognitive Status: Within Functional Limits for tasks assessed                                        General Comments      Exercises     Assessment/Plan    PT Assessment Patient needs continued PT services  PT Problem List Decreased strength;Decreased activity tolerance;Decreased balance;Decreased mobility       PT Treatment Interventions DME instruction;Gait training;Stair training;Functional mobility training;Therapeutic activities;Therapeutic exercise;Patient/family education;Balance training    PT Goals (Current goals can be found in the Care Plan section)  Acute Rehab PT Goals Patient Stated Goal: to return home PT Goal Formulation: With patient Time For Goal Achievement: 09/20/20 Potential to Achieve Goals: Good    Frequency Min 3X/week   Barriers to discharge        Co-evaluation               AM-PAC PT "6 Clicks" Mobility  Outcome Measure Help needed turning from your back to your side while in a flat bed without using bedrails?: A Little Help needed moving from lying on your back to sitting on the side of a flat bed without using bedrails?: A Lot Help needed moving to and from a bed to a chair (including a wheelchair)?: A Lot Help needed standing up from a chair using your arms (e.g., wheelchair or bedside chair)?: A Lot Help needed to walk in hospital room?: A Lot Help needed climbing 3-5 steps with a railing? : A Lot 6 Click Score: 13    End of Session Equipment Utilized During Treatment: Oxygen Activity Tolerance: Patient tolerated treatment well;Patient limited by fatigue Patient left: in chair;with call bell/phone within reach Nurse  Communication: Mobility status PT Visit Diagnosis: Unsteadiness on feet (R26.81);Other abnormalities of gait and mobility (R26.89);Muscle weakness (generalized) (M62.81)    Time: 1062-6948 PT Time Calculation (min) (ACUTE ONLY): 23 min   Charges:   PT Evaluation $PT Eval Moderate Complexity: 1 Mod PT Treatments $Therapeutic Activity: 23-37 mins        3:20 PM, 09/06/20 Ocie Bob, MPT Physical Therapist with [facility] [phone] office [phone] mobile phone

## 2020-09-06 NOTE — NC FL2 (Signed)
[place] MEDICAID FL2 LEVEL OF CARE SCREENING TOOL     IDENTIFICATION  Patient Name: Lori Bishop Birthdate: 10/13/1920 Sex: female Admission Date (Current Location): 09/04/2020  [facility] and [place] Number:  [employer] and Address:  [facility],  [address], [place] [address]      Provider Number: [id number]  Attending Physician Name and Address:  Erick Blinks, DO  Relative Name and Phone Number:  Tonye Royalty (Niece)   [phone]    Current Level of Care: Hospital Recommended Level of Care: Skilled Nursing Facility Prior Approval Number:    Date Approved/Denied:   PASRR Number: [id number]  Discharge Plan: SNF    Current Diagnoses: Patient Active Problem List   Diagnosis Date Noted   Acute respiratory failure with hypoxia (HCC) 09/05/2020   Lactic acidosis 09/05/2020   Thrombocytopenia (HCC) 09/05/2020   Urinary incontinence 05/03/2020   Vaginal odor 03/15/2020   Incomplete uterovaginal prolapse 03/15/2020   Pessary maintenance, Milex ring with support #4, original fit: 07/2017 03/15/2020   Weakness    Hyperkalemia    GERD (gastroesophageal reflux disease) 07/30/2017   Fall 07/30/2017   Chronic diastolic CHF (congestive heart failure) (HCC) 07/30/2017   CAP (community acquired pneumonia) 06/13/2015   Left knee pain 04/26/2015   Right knee pain 04/26/2015   Right upper quadrant pain    Calculus of bile duct with acute cholangitis with obstruction    Choledocholithiasis with cholecystitis    Severe carpal tunnel syndrome 02/02/2014   Hand weakness 02/02/2014   Unspecified hereditary and idiopathic peripheral neuropathy 10/30/2013   Choledocholithiasis 04/17/2013   Abdominal pain 02/14/2013   Acute pancreatitis 02/14/2013   Elevated LFTs 02/14/2013   Hepatic steatosis 02/14/2013   Hyperbilirubinemia 02/14/2013   Leukocytosis 02/14/2013   Osteoarthritis 02/14/2013   RUQ pain 07/07/2012   VIRAL URI 02/17/2008   Chronic  pain of left knee 06/09/2007   PLEURISY 09/03/2006   LIVER FUNCTION TESTS, ABNORMAL 09/03/2006   Hypokalemia 06/14/2006   CONSTIPATION 05/16/2006   NUMBNESS, HAND 05/16/2006   Hypoxemia 04/17/2006   HYPERLIPIDEMIA 01/04/2006   GOUT 01/04/2006   CARPAL TUNNEL SYNDROME 01/04/2006   CATARACT NOS 01/04/2006   Essential hypertension 01/04/2006   BRONCHITIS NOS 01/04/2006   Arthropathy 01/04/2006   SPINAL STENOSIS 01/04/2006   OSTEOPOROSIS 01/04/2006    Orientation RESPIRATION BLADDER Height & Weight     Self, Time, Place  O2 (2L) External catheter, Incontinent Weight: 130 lb 15.3 oz (59.4 kg) Height:  5' (152.4 cm)  BEHAVIORAL SYMPTOMS/MOOD NEUROLOGICAL BOWEL NUTRITION STATUS      Continent Diet (Regular)  AMBULATORY STATUS COMMUNICATION OF NEEDS Skin   Extensive Assist Verbally Skin abrasions, Other (Comment) (Ecchymosis and skin tears on the arm)                       Personal Care Assistance Level of Assistance  Bathing, Feeding, Dressing, Total care Bathing Assistance: Maximum assistance Feeding assistance: Independent Dressing Assistance: Maximum assistance Total Care Assistance: Maximum assistance   Functional Limitations Info  Sight, Speech, Hearing Sight Info: Adequate Hearing Info: Adequate Speech Info: Adequate    SPECIAL CARE FACTORS FREQUENCY  PT (By licensed PT), OT (By licensed OT)     PT Frequency: 5 times weekly OT Frequency: 5 times weekly            Contractures Contractures Info: Not present    Additional Factors Info  Code Status, Allergies Code Status Info: DNR Allergies Info: NKA  Current Medications (09/06/2020):  This is the current hospital active medication list Current Facility-Administered Medications  Medication Dose Route Frequency Provider Last Rate Last Admin   acetaminophen (TYLENOL) tablet 650 mg  650 mg Oral Q6H PRN Adefeso, Oladapo, DO   650 mg at 09/06/20 0337   allopurinol (ZYLOPRIM) tablet 100 mg  100 mg  Oral Daily Sherryll Burger, Pratik D, DO   100 mg at 09/06/20 0935   azithromycin (ZITHROMAX) 500 mg in sodium chloride 0.9 % 250 mL IVPB  500 mg Intravenous Q24H Adefeso, Oladapo, DO   Stopped at 09/06/20 0210   cefTRIAXone (ROCEPHIN) 1 g in sodium chloride 0.9 % 100 mL IVPB  1 g Intravenous Q24H Adefeso, Oladapo, DO   Stopped at 09/06/20 0052   Chlorhexidine Gluconate Cloth 2 % PADS 6 each  6 each Topical Daily Adefeso, Oladapo, DO   6 each at 09/06/20 0947   dextromethorphan-guaiFENesin (MUCINEX DM) 30-600 MG per 12 hr tablet 1 tablet  1 tablet Oral BID Adefeso, Oladapo, DO   1 tablet at 09/06/20 0935   docusate sodium (COLACE) capsule 100 mg  100 mg Oral Daily Sherryll Burger, Pratik D, DO   100 mg at 09/06/20 0935   enoxaparin (LOVENOX) injection 30 mg  30 mg Subcutaneous Q24H Adefeso, Oladapo, DO   30 mg at 09/06/20 0939   gabapentin (NEURONTIN) capsule 100 mg  100 mg Oral TID Maurilio Lovely D, DO   100 mg at 09/06/20 1017   MEDLINE mouth rinse  15 mL Mouth Rinse BID Adefeso, Oladapo, DO   15 mL at 09/06/20 0942   ondansetron (ZOFRAN) injection 4 mg  4 mg Intravenous Q6H PRN Sherryll Burger, Pratik D, DO   4 mg at 09/05/20 1236   oxyCODONE (Oxy IR/ROXICODONE) immediate release tablet 5 mg  5 mg Oral Q6H PRN Sherryll Burger, Pratik D, DO   5 mg at 09/06/20 1059   pantoprazole (PROTONIX) EC tablet 40 mg  40 mg Oral QAC breakfast Sherryll Burger, Pratik D, DO   40 mg at 09/06/20 0936   traMADol (ULTRAM) tablet 50 mg  50 mg Oral Q12H PRN Maurilio Lovely D, DO   50 mg at 09/06/20 5102     Discharge Medications: Please see discharge summary for a list of discharge medications.  Relevant Imaging Results:  Relevant Lab Results:   Additional Information SSN: [id number] [place]

## 2020-09-06 NOTE — Plan of Care (Signed)
  Problem: Acute Rehab PT Goals(only PT should resolve) Goal: Pt Will Go Supine/Side To Sit Outcome: Progressing Flowsheets (Taken 09/06/2020 1522) Pt will go Supine/Side to Sit: with min guard assist Goal: Patient Will Transfer Sit To/From Stand Outcome: Progressing Flowsheets (Taken 09/06/2020 1522) Patient will transfer sit to/from stand: with minimal assist Goal: Pt Will Transfer Bed To Chair/Chair To Bed Outcome: Progressing Flowsheets (Taken 09/06/2020 1522) Pt will Transfer Bed to Chair/Chair to Bed: with min assist Goal: Pt Will Ambulate Outcome: Progressing Flowsheets (Taken 09/06/2020 1522) Pt will Ambulate:  25 feet  with minimal assist  with rolling walker  with moderate assist   3:23 PM, 09/06/20 Ocie Bob, MPT Physical Therapist with [facility] [phone] office [phone] mobile phone

## 2020-09-06 NOTE — TOC Initial Note (Signed)
Transition of Care [facility]) - Initial/Assessment Note    Patient Details  Name: Lori Bishop MRN: [id number] Date of Birth: [birth year]  [facility] CM/SW Contact:    Villa Herb, LCSWA Phone Number: 09/06/2020, 3:09 PM  Clinical Narrative:                 TOC updated PT is recommending SNF for pt. CSW spoke with pts niece/POA Ms. Oretha Milch who states that pt lives alone but has a neighbor that does her meals and medications. Pt does not drive and has transportation provided by family when needed. Pt has had HH in the past. Pt uses a walker in the home. Per Ms. Oretha Milch pt will need to go to SNF. She is agreeable to referral being sent out to [facility], [facility], [facility], and [facility]. CSW completed Fl2 and sent out to local facilities. TOC to follow for bed offers.   Expected Discharge Plan: Skilled Nursing Facility Barriers to Discharge: Continued Medical Work up, SNF Pending bed offer   Patient Goals and CMS Choice Patient states their goals for this hospitalization and ongoing recovery are:: Go to SNF CMS Medicare.gov Compare Post Acute Care list provided to:: Patient Represenative (must comment) (niece/POA) Choice offered to / list presented to : [facility] POA / Guardian  Expected Discharge Plan and Services Expected Discharge Plan: Skilled Nursing Facility In-house Referral: Clinical Social Work Discharge Planning Services: CM Consult Post Acute Care Choice: Skilled Nursing Facility Living arrangements for the past 2 months: Single Family Home                                      Prior Living Arrangements/Services Living arrangements for the past 2 months: Single Family Home Lives with:: Self Patient language and need for interpreter reviewed:: Yes Do you feel safe going back to the place where you live?: Yes      Need for Family Participation in Patient Care: Yes (Comment) Care giver support system in place?: Yes (comment) Current home services: DME Criminal Activity/Legal  Involvement Pertinent to Current Situation/Hospitalization: No - Comment as needed  Activities of Daily Living Home Assistive Devices/Equipment: Bedside commode/3-in-1, Dentures (specify type), Walker (specify type) ADL Screening (condition at time of admission) Patient's cognitive ability adequate to safely complete daily activities?: Yes Is the patient deaf or have difficulty hearing?: No Does the patient have difficulty seeing, even when wearing glasses/contacts?: No Does the patient have difficulty concentrating, remembering, or making decisions?: No Patient able to express need for assistance with ADLs?: Yes Does the patient have difficulty dressing or bathing?: Yes Independently performs ADLs?: No Communication: Independent Dressing (OT): Needs assistance Is this a change from baseline?: Pre-admission baseline Grooming: Needs assistance Is this a change from baseline?: Pre-admission baseline Feeding: Independent Bathing: Needs assistance Is this a change from baseline?: Pre-admission baseline Toileting: Independent with device (comment) Is this a change from baseline?: Pre-admission baseline In/Out Bed: Independent with device (comment) Walks in Home: Independent with device (comment) Does the patient have difficulty walking or climbing stairs?: Yes Weakness of Legs: Both Weakness of Arms/Hands: Both  Permission Sought/Granted                  Emotional Assessment Appearance:: Appears stated age     Orientation: : Oriented to Self, Oriented to Place, Oriented to  Time Alcohol / Substance Use: Not Applicable Psych Involvement: No (comment)  Admission diagnosis:  Pain [R52] Acute respiratory failure with hypoxia (HCC) [J96.01] Patient Active Problem List   Diagnosis Date Noted   Acute respiratory failure with hypoxia (HCC) 09/05/2020   Lactic acidosis 09/05/2020   Thrombocytopenia (HCC) 09/05/2020   Urinary incontinence 05/03/2020   Vaginal odor 03/15/2020    Incomplete uterovaginal prolapse 03/15/2020   Pessary maintenance, Milex ring with support #4, original fit: 07/2017 03/15/2020   Weakness    Hyperkalemia    GERD (gastroesophageal reflux disease) 07/30/2017   Fall 07/30/2017   Chronic diastolic CHF (congestive heart failure) (HCC) 07/30/2017   CAP (community acquired pneumonia) 06/13/2015   Left knee pain 04/26/2015   Right knee pain 04/26/2015   Right upper quadrant pain    Calculus of bile duct with acute cholangitis with obstruction    Choledocholithiasis with cholecystitis    Severe carpal tunnel syndrome 02/02/2014   Hand weakness 02/02/2014   Unspecified hereditary and idiopathic peripheral neuropathy 10/30/2013   Choledocholithiasis 04/17/2013   Abdominal pain 02/14/2013   Acute pancreatitis 02/14/2013   Elevated LFTs 02/14/2013   Hepatic steatosis 02/14/2013   Hyperbilirubinemia 02/14/2013   Leukocytosis 02/14/2013   Osteoarthritis 02/14/2013   RUQ pain 07/07/2012   VIRAL URI 02/17/2008   Chronic pain of left knee 06/09/2007   PLEURISY 09/03/2006   LIVER FUNCTION TESTS, ABNORMAL 09/03/2006   Hypokalemia 06/14/2006   CONSTIPATION 05/16/2006   NUMBNESS, HAND 05/16/2006   Hypoxemia 04/17/2006   HYPERLIPIDEMIA 01/04/2006   GOUT 01/04/2006   CARPAL TUNNEL SYNDROME 01/04/2006   CATARACT NOS 01/04/2006   Essential hypertension 01/04/2006   BRONCHITIS NOS 01/04/2006   Arthropathy 01/04/2006   SPINAL STENOSIS 01/04/2006   OSTEOPOROSIS 01/04/2006   PCP:  Avon Gully, MD Pharmacy:   [facility] [place] - [address] [place] [address] Phone: [phone] Fax: [phone]  [facility] [phone] - [place], [place] - [address] AT [place] [address] [place] [address] Phone: [phone] Fax: [phone]     Social Determinants of Health (SDOH) Interventions    Readmission Risk Interventions Readmission Risk Prevention Plan  09/06/2020  Medication Screening Complete  Transportation Screening Complete  Some recent data might be hidden

## 2020-09-06 NOTE — Progress Notes (Signed)
PROGRESS NOTE    Lori Bishop  ERD:[id number] DOB: [birth year] DOA: 09/04/2020 PCP: Avon Gully, MD   Brief Narrative:   Lori Bishop is a 87 y.o. female with medical history significant for hypertension, gout, osteoarthritis, GERD, chronic diastolic CHF who presents to the emergency department via EMS due to left knee pain with difficulty to ambulate even with assistance.  She was noted to have intermittent hypotension and hypoxemia in the ED with work-up demonstrating a left upper lobe perihilar masslike opacity that is concerning for infection or malignancy.  She is also noted to have AKI.  She has empirically been started on IV Rocephin and azithromycin for treatment of her community-acquired pneumonia.  OT recommending SNF on discharge with PT evaluation pending.  Assessment & Plan:   Principal Problem:   CAP (community acquired pneumonia) Active Problems:   GOUT   Essential hypertension   Leukocytosis   Osteoarthritis   Left knee pain   GERD (gastroesophageal reflux disease)   Fall   Chronic diastolic CHF (congestive heart failure) (HCC)   Acute respiratory failure with hypoxia (HCC)   Lactic acidosis   Thrombocytopenia (HCC)   Acute hypoxemic respiratory failure secondary to CAP with newly suspected malignancy -Reviewed imaging with pulmonology with recommendations for PET/CT outpatient if further evaluation and management is desired, however this is not recommended given advanced age -Continue azithromycin and Rocephin -Continue to monitor leukocytosis and lactic acidosis in a.m.  AKI-resolving -Follow labs in a.m.  Left knee osteoarthritis with ongoing pain secondary to accidental fall -Pain management increased with oxycodone added 7/5 -PT evaluation pending -Patient will require SNF on discharge  Essential hypertension -Blood pressures currently well controlled, hold BP medications  History of diastolic CHF -Holding Lasix and on lisinopril at this time  due to AKI -Hold further IV fluid  GERD -Continue PPI  Gout -Okay to continue allopurinol  DVT prophylaxis: Lovenox Code Status: DNR Family Communication: Discussed with niece on phone 7/4 Disposition Plan:  Status is: Inpatient  Remains inpatient appropriate because:IV treatments appropriate due to intensity of illness or inability to take PO and Inpatient level of care appropriate due to severity of illness  Dispo: The patient is from: Home              Anticipated d/c is to: SNF              Patient currently is not medically stable to d/c.   Difficult to place patient No   Consultants:  None  Procedures:  See below  Antimicrobials:  Anti-infectives (From admission, onward)    Start     Dose/Rate Route Frequency Ordered Stop   09/06/20 0100  azithromycin (ZITHROMAX) 500 mg in sodium chloride 0.9 % 250 mL IVPB        500 mg 250 mL/hr over 60 Minutes Intravenous Every 24 hours 09/05/20 0215 09/11/20 0059   09/05/20 2359  cefTRIAXone (ROCEPHIN) 1 g in sodium chloride 0.9 % 100 mL IVPB        1 g 200 mL/hr over 30 Minutes Intravenous Every 24 hours 09/05/20 0215 09/12/20 2359   09/04/20 2315  cefTRIAXone (ROCEPHIN) 1 g in sodium chloride 0.9 % 100 mL IVPB        1 g 200 mL/hr over 30 Minutes Intravenous  Once 09/04/20 2306 09/05/20 0033   09/04/20 2315  azithromycin (ZITHROMAX) 500 mg in sodium chloride 0.9 % 250 mL IVPB        500 mg 250 mL/hr over 60  Minutes Intravenous  Once 09/04/20 2306 09/05/20 0258      Subjective: Patient seen and evaluated today with complaints of ongoing pain to her knee that is poorly controlled on gabapentin and tramadol.  Objective: Vitals:   09/05/20 1518 09/05/20 2117 09/06/20 0351 09/06/20 1334  BP: 113/70 114/63 120/70 110/65  Pulse: 87 84 83 82  Resp:  20 17 16   Temp: 98.3 F (36.8 C) 98.1 F (36.7 C) (!) 97.3 F (36.3 C) 97.9 F (36.6 C)  TempSrc: Oral   Oral  SpO2: 97% 99% 93% 94%  Weight:      Height:         Intake/Output Summary (Last 24 hours) at 09/06/2020 1456 Last data filed at 09/06/2020 1336 Gross per 24 hour  Intake 3016.54 ml  Output 300 ml  Net 2716.54 ml   Filed Weights   09/04/20 2037 09/05/20 0212  Weight: 54.4 kg 59.4 kg    Examination:  General exam: Appears calm and comfortable, elderly female Respiratory system: Clear to auscultation. Respiratory effort normal.  Currently on nasal cannula oxygen. Cardiovascular system: S1 & S2 heard, RRR.  Gastrointestinal system: Abdomen is soft Central nervous system: Alert and awake Extremities: No edema Skin: No significant lesions noted Psychiatry: Flat affect.    Data Reviewed: I have personally reviewed following labs and imaging studies  CBC: Recent Labs  Lab 09/04/20 2207 09/05/20 0416 09/06/20 0428  WBC 13.9* 13.3* 13.8*  NEUTROABS 11.2*  --   --   HGB 12.9 12.6 11.6*  HCT 39.2 38.7 35.5*  MCV 88.9 88.8 88.3  PLT 143* 133* 132*   Basic Metabolic Panel: Recent Labs  Lab 09/04/20 2207 09/05/20 0416 09/06/20 0428  NA 137 139 138  K 4.8 4.7 4.5  CL 109 110 108  CO2 20* 21* 22  GLUCOSE 111* 101* 102*  BUN 20 19 21   CREATININE 1.54* 1.55* 1.40*  CALCIUM 8.2* 8.0* 8.3*  MG  --  2.1 2.1  PHOS  --  2.9  --    GFR: Estimated Creatinine Clearance: 18.5 mL/min (A) (by C-G formula based on SCr of 1.4 mg/dL (H)). Liver Function Tests: Recent Labs  Lab 09/05/20 0416 09/06/20 0428  AST 43* 34  ALT 23 19  ALKPHOS 78 69  BILITOT 1.1 0.8  PROT 6.2* 6.0*  ALBUMIN 3.1* 3.0*   No results for input(s): LIPASE, AMYLASE in the last 168 hours. No results for input(s): AMMONIA in the last 168 hours. Coagulation Profile: Recent Labs  Lab 09/05/20 0416  INR 1.1   Cardiac Enzymes: No results for input(s): CKTOTAL, CKMB, CKMBINDEX, TROPONINI in the last 168 hours. BNP (last 3 results) No results for input(s): PROBNP in the last 8760 hours. HbA1C: No results for input(s): HGBA1C in the last 72  hours. CBG: Recent Labs  Lab 09/06/20 0734  GLUCAP 92   Lipid Profile: No results for input(s): CHOL, HDL, LDLCALC, TRIG, CHOLHDL, LDLDIRECT in the last 72 hours. Thyroid Function Tests: No results for input(s): TSH, T4TOTAL, FREET4, T3FREE, THYROIDAB in the last 72 hours. Anemia Panel: No results for input(s): VITAMINB12, FOLATE, FERRITIN, TIBC, IRON, RETICCTPCT in the last 72 hours. Sepsis Labs: Recent Labs  Lab 09/05/20 0416 09/05/20 0711 09/06/20 0428 09/06/20 0719 09/06/20 1033  PROCALCITON 0.18  --   --   --   --   LATICACIDVEN 3.1* 2.2* 2.4* 2.2* 2.8*    Recent Results (from the past 240 hour(s))  Culture, blood (routine x 2)     Status:  None (Preliminary result)   Collection Time: 09/04/20 11:09 PM   Specimen: Blood  Result Value Ref Range Status   Specimen Description RIGHT ANTECUBITAL  Final   Special Requests   Final    BOTTLES DRAWN AEROBIC AND ANAEROBIC Blood Culture adequate volume Performed at [facility], [address], [place], [place] [address]    Culture PENDING  Incomplete   Report Status PENDING  Incomplete  Culture, blood (routine x 2)     Status: None (Preliminary result)   Collection Time: 09/04/20 11:09 PM   Specimen: Blood  Result Value Ref Range Status   Specimen Description BLOOD LEFT HAND  Final   Special Requests   Final    BOTTLES DRAWN AEROBIC ONLY Blood Culture adequate volume Performed at [facility], [address] [place], [place] [address]    Culture PENDING  Incomplete   Report Status PENDING  Incomplete  Resp Panel by RT-PCR (Flu A&B, Covid) Nasopharyngeal Swab     Status: None   Collection Time: 09/05/20 12:41 AM   Specimen: Nasopharyngeal Swab; Nasopharyngeal(NP) swabs in vial transport medium  Result Value Ref Range Status   SARS Coronavirus 2 by RT PCR NEGATIVE NEGATIVE Final    Comment: (NOTE) SARS-CoV-2 target nucleic acids are NOT DETECTED.  The SARS-CoV-2 RNA is generally detectable in upper  respiratory specimens during the acute phase of infection. The lowest concentration of SARS-CoV-2 viral copies this assay can detect is 138 copies/mL. A negative result does not preclude SARS-Cov-2 infection and should not be used as the sole basis for treatment or other patient management decisions. A negative result may occur with  improper specimen collection/handling, submission of specimen other than nasopharyngeal swab, presence of viral mutation(s) within the areas targeted by this assay, and inadequate number of viral copies(<138 copies/mL). A negative result must be combined with clinical observations, patient history, and epidemiological information. The expected result is Negative.  Fact Sheet for Patients:  BloggerCourse.com  Fact Sheet for Healthcare Providers:  SeriousBroker.it  This test is no t yet approved or cleared by the [place] FDA and  has been authorized for detection and/or diagnosis of SARS-CoV-2 by FDA under an Emergency Use Authorization (EUA). This EUA will remain  in effect (meaning this test can be used) for the duration of the COVID-19 declaration under Section 564(b)(1) of the Act, 21 U.S.C.section 360bbb-3(b)(1), unless the authorization is terminated  or revoked sooner.       Influenza A by PCR NEGATIVE NEGATIVE Final   Influenza B by PCR NEGATIVE NEGATIVE Final    Comment: (NOTE) The Xpert Xpress SARS-CoV-2/FLU/RSV plus assay is intended as an aid in the diagnosis of influenza from Nasopharyngeal swab specimens and should not be used as a sole basis for treatment. Nasal washings and aspirates are unacceptable for Xpert Xpress SARS-CoV-2/FLU/RSV testing.  Fact Sheet for Patients: BloggerCourse.com  Fact Sheet for Healthcare Providers: SeriousBroker.it  This test is not yet approved or cleared by the [place] FDA and has been  authorized for detection and/or diagnosis of SARS-CoV-2 by FDA under an Emergency Use Authorization (EUA). This EUA will remain in effect (meaning this test can be used) for the duration of the COVID-19 declaration under Section 564(b)(1) of the Act, 21 U.S.C. section 360bbb-3(b)(1), unless the authorization is terminated or revoked.  Performed at [facility], [address] [place], [place] [address]   MRSA Next Gen by PCR, Nasal     Status: None   Collection Time: 09/05/20  2:15 AM  Specimen: Nasal Mucosa; Nasal Swab  Result Value Ref Range Status   MRSA by PCR Next Gen NOT DETECTED NOT DETECTED Final    Comment: (NOTE) The GeneXpert MRSA Assay (FDA approved for NASAL specimens only), is one component of a comprehensive MRSA colonization surveillance program. It is not intended to diagnose MRSA infection nor to guide or monitor treatment for MRSA infections. Test performance is not FDA approved in patients less than 47 years old. Performed at [facility], [address], [place], [place] [address]   Urine Culture     Status: Abnormal   Collection Time: 09/05/20  6:55 AM   Specimen: Urine, Random  Result Value Ref Range Status   Specimen Description   Final    URINE, RANDOM Performed at [facility], [address], [place], [place] [address]    Special Requests   Final    NONE Performed at [facility] [place], [address], [place], [place] [address]    Culture (A)  Final    <10,000 COLONIES/mL INSIGNIFICANT GROWTH Performed at [facility], [address] [place], [place] [address]    Report Status 09/06/2020 FINAL  Final         Radiology Studies: DG Chest 1 View  Result Date: 09/04/2020 CLINICAL DATA:  Pain after fall. EXAM: CHEST  1 VIEW COMPARISON:  07/30/2017 FINDINGS: Shallow inspiration. Cardiac enlargement. Bilateral perihilar masslike consolidation, greatest on the left. This is new since prior study and probably represents multifocal pneumonia. Follow-up  to resolution recommended to exclude underlying mass lesion. Suggestion of a small right pleural effusion. Calcified and tortuous aorta. Thoracolumbar degenerative changes and scoliosis. IMPRESSION: New masslike perihilar infiltrates in the lungs, likely pneumonia. Follow-up to resolution recommended to exclude underlying mass lesions. Electronically Signed   By: Burman Nieves M.D.   On: 09/04/2020 22:26   CT Chest Wo Contrast  Result Date: 09/04/2020 CLINICAL DATA:  Pneumonia, effusion or abscess suspected, xray done 87 year old with left leg and flank pain. Fall. Abnormal chest x-ray. EXAM: CT CHEST WITHOUT CONTRAST TECHNIQUE: Multidetector CT imaging of the chest was performed following the standard protocol without IV contrast. COMPARISON:  Radiograph earlier this day. No prior chest CT available. FINDINGS: Cardiovascular: Moderate aortic atherosclerosis. There is diffuse aortic tortuosity. Curvilinear calcifications in the descending aorta are stable from 2018 abdominal CT and likely represent calcified mural thrombus. Coronary artery calcifications. Borderline cardiomegaly. Dilated central pulmonary artery at 3.4 cm. No pericardial effusion. Mediastinum/Nodes: Assessment for mediastinal and hilar adenopathy is limited in the absence of IV contrast. There is a lobulated left upper lobe perihilar mass that is difficult to delineate from the adjacent hilum. Enlarged 15 mm subcarinal node series 2, image 64. no gross esophageal wall thickening. No suspicious thyroid nodule. Lungs/Pleura: Left upper lobe perihilar masslike opacity. This is contiguous with the hilum centrally making measurements difficult, however representative measurement of 4.3 x 2.7 x 3.7 cm, series 4, image 40 and series 5, image 66. There a few central calcifications. There is surrounding spiculation and ground-glass opacity extending along the fissure towards the apex as well as anterior inferiorly. This causes luminal narrowing of the  left upper lobe bronchus. Linear and irregular opacities in the lingula are typical of atelectasis. Small left pleural effusion. Left basilar opacities likely combination of compressive and subsegmental atelectasis. There is left lower lobe bronchial thickening. Small right pleural effusion. Adjacent right lower lobe atelectasis and subsegmental opacity. Right-sided bronchial thickening, severe in the lower lobes with areas of mucous plugging  and mucoid impaction. There is mild ground-glass opacity in the dependent and perifissural right upper lobe. Upper Abdomen: Assessed on concurrent abdominal CT, reported separately. Musculoskeletal: T11 vertebral body and pedicles demonstrates heterogeneous trabecular coarsening, also seen on 2018 abdominal CT. Scoliosis and degenerative change throughout the thoracic spine. Advanced bilateral shoulder osteoarthritis. IMPRESSION: 1. Left upper lobe perihilar masslike opacity measuring 4.3 x 2.7 x 3.7 cm. This is contiguous with the adjacent hilum and causes luminal narrowing of the left upper lobe bronchus. Differential considerations include infection and malignancy. There are occasional central calcifications. Enlarged subcarinal node measuring 15 mm, nonspecific. If tissue sampling is not pursued, recommend close radiologic follow-up after course of treatment. 2. Small left and small right pleural effusions. Bilateral lower lobe bronchial thickening with areas of mucous plugging and mucoid impaction. 3. Aortic atherosclerosis. Coronary artery calcifications. Dilated central pulmonary artery consistent with pulmonary arterial hypertension. 4. Heterogeneous trabecular coarsening of T11 vertebral body and pedicles, also seen on 2018 abdominal CT. This may be secondary to hemangioma or Paget's disease, but is nonspecific. Stability suggests a nonaggressive etiology. Aortic Atherosclerosis (ICD10-I70.0). Electronically Signed   By: Narda RutherfordMelanie  Sanford M.D.   On: 09/04/2020 23:43    DG Knee Complete 4 Views Left  Result Date: 09/05/2020 CLINICAL DATA:  Knee pain.  Fall. EXAM: LEFT KNEE - COMPLETE 4+ VIEW COMPARISON:  None. FINDINGS: No fracture or dislocation. Advanced tibiofemoral osteoarthritis, more prominent in the lateral compartment. Downsloping of the posterolateral tibia. Mild patellofemoral osteoarthritis. There is a small knee joint effusion. The bones are diffusely under mineralized. Mild soft tissue edema. IMPRESSION: 1. No fracture or dislocation of the left knee. 2. Advanced tibiofemoral osteoarthritis, more prominent in the lateral compartment. Small knee joint effusion. 3. Osteopenia/osteoporosis. Electronically Signed   By: Narda RutherfordMelanie  Sanford M.D.   On: 09/05/2020 00:00   DG Knee Complete 4 Views Right  Result Date: 09/04/2020 CLINICAL DATA:  Pain after a fall EXAM: RIGHT KNEE - COMPLETE 4+ VIEW COMPARISON:  07/30/2017 FINDINGS: Diffuse bone demineralization. No evidence of acute fracture or dislocation. Bipartite patella. No focal bone lesion or bone destruction. Bone cortex appears intact. No significant effusions. Soft tissues are unremarkable. IMPRESSION: No acute fracture or dislocation. Electronically Signed   By: Burman NievesWilliam  Stevens M.D.   On: 09/04/2020 22:28   CT Renal Stone Study  Result Date: 09/04/2020 CLINICAL DATA:  Left flank and left leg pain. EXAM: CT ABDOMEN AND PELVIS WITHOUT CONTRAST TECHNIQUE: Multidetector CT imaging of the abdomen and pelvis was performed following the standard protocol without IV contrast. COMPARISON:  Noncontrast CT 02/05/2017 FINDINGS: Lower chest: Assessed on concurrent chest CT, reported separately. Hepatobiliary: Chronic left lobe atrophy, biliary dilatation and pneumobilia. No discrete focal hepatic lesion. Cholecystectomy. Pancreas: Grossly normal. No evidence of peripancreatic inflammation. Spleen: Small in size.  No evidence of focal splenic abnormality. Adrenals/Urinary Tract: Left adrenal thickening without dominant  nodule. No right adrenal nodule. There is mild bilateral renal parenchymal thinning. No hydronephrosis or renal calculi. No perinephric edema. Urinary bladder is unremarkable, partially obscured by streak artifact from right hip arthroplasty. Stomach/Bowel: Bowel assessment is limited in the absence of enteric contrast and paucity of intra-abdominal fat. Decompressed stomach. There is no small bowel obstruction or inflammation. Appendectomy. Scattered left colonic diverticulosis without diverticulitis. Transverse colon is redundant. Vascular/Lymphatic: Advanced aortic atherosclerosis. No aneurysm. No obvious abdominopelvic adenopathy, allowing for limitations related to lack of contrast. Reproductive: Pessary in place. Uterus tentatively visualized deviating into the right pelvis, obscured by streak artifact from right hip  arthroplasty. No evidence of adnexal mass, with limited right adnexal assessment. Other: Umbilical hernia contains fat with mild stranding. No bowel involvement. No abdominopelvic ascites. No free air. There is mild bilateral flank soft tissue edema. Musculoskeletal: Right hip arthroplasty. Lumbar scoliosis and degenerative change. No acute osseous abnormalities are seen. IMPRESSION: 1. No renal stones or obstructive uropathy. 2. Umbilical hernia contains fat with mild stranding. No bowel involvement. Recommend correlation for any hernia pain. 3. Post cholecystectomy with chronic pneumobilia and atrophy of the left hepatic lobe. 4. Colonic diverticulosis without diverticulitis. Aortic Atherosclerosis (ICD10-I70.0). Electronically Signed   By: Narda Rutherford M.D.   On: 09/04/2020 23:49   DG Hip Unilat With Pelvis 2-3 Views Left  Result Date: 09/04/2020 CLINICAL DATA:  Left hip pain after a fall. EXAM: DG HIP (WITH OR WITHOUT PELVIS) 2-3V LEFT COMPARISON:  None. FINDINGS: Degenerative changes in the lower lumbar spine and left hip. Previous right hip hemiarthroplasty. Left hip is rotated,  limiting evaluation of the femoral neck. No acute fracture or dislocation is identified. No focal bone lesions or bone destruction. Pelvis appears intact. SI joints and symphysis pubis are not displaced. Prominent vascular calcifications. IMPRESSION: No acute bony abnormalities. Degenerative changes in the lower lumbar spine and left hip. Previous right hip hemiarthroplasty. Electronically Signed   By: Burman Nieves M.D.   On: 09/04/2020 22:25        Scheduled Meds:  allopurinol  100 mg Oral Daily   Chlorhexidine Gluconate Cloth  6 each Topical Daily   dextromethorphan-guaiFENesin  1 tablet Oral BID   docusate sodium  100 mg Oral Daily   enoxaparin (LOVENOX) injection  30 mg Subcutaneous Q24H   gabapentin  100 mg Oral TID   mouth rinse  15 mL Mouth Rinse BID   pantoprazole  40 mg Oral QAC breakfast   Continuous Infusions:  azithromycin Stopped (09/06/20 0210)   cefTRIAXone (ROCEPHIN)  IV Stopped (09/06/20 0052)   lactated ringers 75 mL/hr at 09/06/20 0212     LOS: 1 day    Time spent: 35 minutes    Lori Shockley D Sherryll Burger, DO [facility]  If 7PM-7AM, please contact night-coverage [email or web address] 09/06/2020, 2:56 PM

## 2020-09-06 NOTE — Progress Notes (Signed)
Date and time results received: 09/06/20 0550   Test: Lactic Acid  Critical Value: 2.4  Name of Provider Notified: Thomes Dinning MD  Orders Received? Or Actions Taken?: No new orders at this time.

## 2020-09-06 NOTE — [place] FL2 (Deleted)
[place] MEDICAID [id number] LEVEL OF CARE SCREENING TOOL     IDENTIFICATION  Patient Name: Lori Bishop Birthdate: 1920-04-03 Sex: female Admission Date (Current Location): 09/04/2020  [facility] and [place] Number:  [employer] and Address:  [facility],  [address] [place] [address]      Provider Number: [id number]  Attending Physician Name and Address:  Erick Blinks, DO  Relative Name and Phone Number:  Tonye Royalty (Niece)   [phone]    Current Level of Care: Hospital Recommended Level of Care: Skilled Nursing Facility Prior Approval Number:    Date Approved/Denied:   PASRR Number:    Discharge Plan: SNF    Current Diagnoses: Patient Active Problem List   Diagnosis Date Noted   Acute respiratory failure with hypoxia (HCC) 09/05/2020   Lactic acidosis 09/05/2020   Thrombocytopenia (HCC) 09/05/2020   Urinary incontinence 05/03/2020   Vaginal odor 03/15/2020   Incomplete uterovaginal prolapse 03/15/2020   Pessary maintenance, Milex ring with support #4, original fit: 07/2017 03/15/2020   Weakness    Hyperkalemia    GERD (gastroesophageal reflux disease) 07/30/2017   Fall 07/30/2017   Chronic diastolic CHF (congestive heart failure) (HCC) 07/30/2017   CAP (community acquired pneumonia) 06/13/2015   Left knee pain 04/26/2015   Right knee pain 04/26/2015   Right upper quadrant pain    Calculus of bile duct with acute cholangitis with obstruction    Choledocholithiasis with cholecystitis    Severe carpal tunnel syndrome 02/02/2014   Hand weakness 02/02/2014   Unspecified hereditary and idiopathic peripheral neuropathy 10/30/2013   Choledocholithiasis 04/17/2013   Abdominal pain 02/14/2013   Acute pancreatitis 02/14/2013   Elevated LFTs 02/14/2013   Hepatic steatosis 02/14/2013   Hyperbilirubinemia 02/14/2013   Leukocytosis 02/14/2013   Osteoarthritis 02/14/2013   RUQ pain 07/07/2012   VIRAL URI 02/17/2008   Chronic pain of left  knee 06/09/2007   PLEURISY 09/03/2006   LIVER FUNCTION TESTS, ABNORMAL 09/03/2006   Hypokalemia 06/14/2006   CONSTIPATION 05/16/2006   NUMBNESS, HAND 05/16/2006   Hypoxemia 04/17/2006   HYPERLIPIDEMIA 01/04/2006   GOUT 01/04/2006   CARPAL TUNNEL SYNDROME 01/04/2006   CATARACT NOS 01/04/2006   Essential hypertension 01/04/2006   BRONCHITIS NOS 01/04/2006   Arthropathy 01/04/2006   SPINAL STENOSIS 01/04/2006   OSTEOPOROSIS 01/04/2006    Orientation RESPIRATION BLADDER Height & Weight     Self, Time, Place  O2 (2L) External catheter, Incontinent Weight: 130 lb 15.3 oz (59.4 kg) Height:  5' (152.4 cm)  BEHAVIORAL SYMPTOMS/MOOD NEUROLOGICAL BOWEL NUTRITION STATUS      Continent Diet (Regular)  AMBULATORY STATUS COMMUNICATION OF NEEDS Skin   Extensive Assist Verbally Skin abrasions, Other (Comment) (Ecchymosis and skin tears on the arm)                       Personal Care Assistance Level of Assistance  Bathing, Feeding, Dressing, Total care Bathing Assistance: Maximum assistance Feeding assistance: Independent Dressing Assistance: Maximum assistance Total Care Assistance: Maximum assistance   Functional Limitations Info  Sight, Speech, Hearing Sight Info: Adequate Hearing Info: Adequate Speech Info: Adequate    SPECIAL CARE FACTORS FREQUENCY  PT (By licensed PT), OT (By licensed OT)     PT Frequency: 5 times weekly OT Frequency: 5 times weekly            Contractures Contractures Info: Not present    Additional Factors Info  Code Status, Allergies Code Status Info: DNR Allergies Info: NKA  Current Medications (09/06/2020):  This is the current hospital active medication list Current Facility-Administered Medications  Medication Dose Route Frequency Provider Last Rate Last Admin   acetaminophen (TYLENOL) tablet 650 mg  650 mg Oral Q6H PRN Adefeso, Oladapo, DO   650 mg at 09/06/20 0337   allopurinol (ZYLOPRIM) tablet 100 mg  100 mg Oral Daily  Sherryll Burger, Pratik D, DO   100 mg at 09/06/20 0935   azithromycin (ZITHROMAX) 500 mg in sodium chloride 0.9 % 250 mL IVPB  500 mg Intravenous Q24H Adefeso, Oladapo, DO   Stopped at 09/06/20 0210   cefTRIAXone (ROCEPHIN) 1 g in sodium chloride 0.9 % 100 mL IVPB  1 g Intravenous Q24H Adefeso, Oladapo, DO   Stopped at 09/06/20 0052   Chlorhexidine Gluconate Cloth 2 % PADS 6 each  6 each Topical Daily Adefeso, Oladapo, DO   6 each at 09/06/20 0947   dextromethorphan-guaiFENesin (MUCINEX DM) 30-600 MG per 12 hr tablet 1 tablet  1 tablet Oral BID Adefeso, Oladapo, DO   1 tablet at 09/06/20 0935   docusate sodium (COLACE) capsule 100 mg  100 mg Oral Daily Sherryll Burger, Pratik D, DO   100 mg at 09/06/20 0935   enoxaparin (LOVENOX) injection 30 mg  30 mg Subcutaneous Q24H Adefeso, Oladapo, DO   30 mg at 09/06/20 0939   gabapentin (NEURONTIN) capsule 100 mg  100 mg Oral TID Maurilio Lovely D, DO   100 mg at 09/06/20 9678   MEDLINE mouth rinse  15 mL Mouth Rinse BID Adefeso, Oladapo, DO   15 mL at 09/06/20 0942   ondansetron (ZOFRAN) injection 4 mg  4 mg Intravenous Q6H PRN Sherryll Burger, Pratik D, DO   4 mg at 09/05/20 1236   oxyCODONE (Oxy IR/ROXICODONE) immediate release tablet 5 mg  5 mg Oral Q6H PRN Sherryll Burger, Pratik D, DO   5 mg at 09/06/20 1059   pantoprazole (PROTONIX) EC tablet 40 mg  40 mg Oral QAC breakfast Sherryll Burger, Pratik D, DO   40 mg at 09/06/20 0936   traMADol (ULTRAM) tablet 50 mg  50 mg Oral Q12H PRN Maurilio Lovely D, DO   50 mg at 09/06/20 9381     Discharge Medications: Please see discharge summary for a list of discharge medications.  Relevant Imaging Results:  Relevant Lab Results:   Additional Information SSN: [id number], [place]

## 2020-09-07 LAB — BASIC METABOLIC PANEL
Anion gap: 9 (ref 5–15)
Anion gap: 10 (ref 5–15)
BUN: 22 mg/dL (ref 8–23)
BUN: 22 mg/dL (ref 8–23)
CO2: 21 mmol/L — ABNORMAL LOW (ref 22–32)
CO2: 20 mmol/L — ABNORMAL LOW (ref 22–32)
Calcium: 8.8 mg/dL — ABNORMAL LOW (ref 8.9–10.3)
Calcium: 8.6 mg/dL — ABNORMAL LOW (ref 8.9–10.3)
Chloride: 108 mmol/L (ref 98–111)
Chloride: 107 mmol/L (ref 98–111)
Creatinine, Ser: 1.32 mg/dL — ABNORMAL HIGH (ref 0.44–1.00)
Creatinine, Ser: 1.36 mg/dL — ABNORMAL HIGH (ref 0.44–1.00)
GFR, Estimated: 37 mL/min — ABNORMAL LOW (ref >60)
GFR, Estimated: 35 mL/min — ABNORMAL LOW (ref >60)
Glucose, Bld: 89 mg/dL (ref 70–99)
Glucose, Bld: 86 mg/dL (ref 70–99)
Potassium: 5.8 mmol/L — ABNORMAL HIGH (ref 3.5–5.1)
Potassium: 4.9 mmol/L (ref 3.5–5.1)
Sodium: 138 mmol/L (ref 135–145)
Sodium: 137 mmol/L (ref 135–145)

## 2020-09-07 LAB — CBC
HCT: 35.8 % — ABNORMAL LOW (ref 36.0–46.0)
Hemoglobin: 11.6 g/dL — ABNORMAL LOW (ref 12.0–15.0)
MCH: 28.7 pg (ref 26.0–34.0)
MCHC: 32.4 g/dL (ref 30.0–36.0)
MCV: 88.6 fL (ref 80.0–100.0)
Platelets: 144 10*3/uL — ABNORMAL LOW (ref 150–400)
RBC: 4.04 MIL/uL (ref 3.87–5.11)
RDW: 15.6 % — ABNORMAL HIGH (ref 11.5–15.5)
WBC: 13.2 10*3/uL — ABNORMAL HIGH (ref 4.0–10.5)
nRBC: 0 % (ref 0.0–0.2)

## 2020-09-07 LAB — RESP PANEL BY RT-PCR (FLU A&B, COVID) ARPGX2
Influenza A by PCR: NEGATIVE
Influenza B by PCR: NEGATIVE
SARS Coronavirus 2 by RT PCR: NEGATIVE

## 2020-09-07 LAB — LACTIC ACID, PLASMA: Lactic Acid, Venous: 2.8 mmol/L (ref 0.5–1.9)

## 2020-09-07 LAB — MAGNESIUM: Magnesium: 2.4 mg/dL (ref 1.7–2.4)

## 2020-09-07 MED ORDER — CEPHALEXIN 500 MG PO CAPS: 500.0000 mg | ORAL_CAPSULE | Freq: Four times a day (QID) | ORAL | 0 refills | Status: AC

## 2020-09-07 MED ORDER — DM-GUAIFENESIN ER 30-600 MG PO TB12: 1.0000 | ORAL_TABLET | Freq: Two times a day (BID) | ORAL | 0 refills | Status: AC

## 2020-09-07 MED ORDER — TRAMADOL HCL 50 MG PO TABS: 50.0000 mg | ORAL_TABLET | Freq: Three times a day (TID) | ORAL | 0 refills | Status: AC | PRN

## 2020-09-07 MED ORDER — SODIUM POLYSTYRENE SULFONATE 15 GM/60ML PO SUSP
30.0000 g | Freq: Once | ORAL | Status: AC
  Administered 2020-09-07: 30 g via ORAL
  Filled 2020-09-07: qty 120

## 2020-09-07 NOTE — TOC Progression Note (Signed)
Transition of Care [facility]) - Progression Note    Patient Details  Name: Lori Bishop MRN: [id number] Date of Birth: 04-17-1920  Transition of Care [facility]) CM/SW Contact  Elliot Gault, LCSW Phone Number: 09/07/2020, 1:50 PM  Clinical Narrative:     TOC following. Pt agreeable to SNF rehab. Plan is for dc to [facility] tomorrow. MD ordering new covid test.  Will follow up in AM.  Expected Discharge Plan: Skilled Nursing Facility Barriers to Discharge: Continued Medical Work up, SNF Pending bed offer  Expected Discharge Plan and Services Expected Discharge Plan: Skilled Nursing Facility In-house Referral: Clinical Social Work Discharge Planning Services: CM Consult Post Acute Care Choice: Skilled Nursing Facility Living arrangements for the past 2 months: Single Family Home Expected Discharge Date: 09/07/20                                     Social Determinants of Health (SDOH) Interventions    Readmission Risk Interventions Readmission Risk Prevention Plan 09/06/2020  Medication Screening Complete  Transportation Screening Complete  Some recent data might be hidden

## 2020-09-07 NOTE — Discharge Summary (Addendum)
Physician Discharge Summary  [name]:[id number] DOB: [birth year] DOA: 09/04/2020  PCP: Avon Gully, MD  Admit date: 09/04/2020  Discharge date: 09/08/2020  Admitted From:Home  Disposition:  SNF  Recommendations for Outpatient Follow-up:  Follow up with PCP in 1-2 weeks Continue on Keflex as prescribed to complete course of treatment for pneumonia Continue pain medications as prior  Home Health: None  Equipment/Devices: None  Discharge Condition:Stable  CODE STATUS: DNR  Diet recommendation: Heart Healthy  Brief/Interim Summary:  Lori Bishop is a 87 y.o. female with medical history significant for hypertension, gout, osteoarthritis, GERD, chronic diastolic CHF who presents to the emergency department via EMS due to left knee pain with difficulty ambulating even with assistance.  She was noted to have intermittent hypotension and hypoxemia in the ED with work-up demonstrating a left upper lobe perihilar masslike opacity that is concerning for infection or malignancy.  She was also noted to have AKI.  She had empirically been started on IV Rocephin and azithromycin for treatment of her community-acquired pneumonia and can now be transition to oral antibiotics with Keflex.  Discussion was had with pulmonology regarding findings of a mass on her CT chest which does not appear to be appropriate for further work-up given her advanced age and inability to be able to deal with any potential treatment options.  Niece also agrees that further work-up would not be appropriate given her condition.  She is agreeable to SNF on discharge after PT evaluation recommended rehabilitation acutely.  No other acute concerns or events noted during the course of this admission.  She is overall stable for discharge and is only requiring minimal nasal cannula oxygen.  Discharge Diagnoses:  Principal Problem:   CAP (community acquired pneumonia) Active Problems:   GOUT   Essential hypertension    Leukocytosis   Osteoarthritis   Left knee pain   GERD (gastroesophageal reflux disease)   Fall   Chronic diastolic CHF (congestive heart failure) (HCC)   Acute respiratory failure with hypoxia (HCC)   Lactic acidosis   Thrombocytopenia (HCC)  Principal discharge diagnosis: Acute hypoxemic respiratory failure secondary to CAP with newly suspected pulmonary malignancy.  Discharge Instructions  Discharge Instructions     Diet - low sodium heart healthy   Complete by: As directed    Discharge wound care:   Complete by: As directed    Daily dressing changes.   Increase activity slowly   Complete by: As directed       Allergies as of 09/08/2020   No Known Allergies      Medication List     TAKE these medications    allopurinol 100 MG tablet Commonly known as: ZYLOPRIM Take 100 mg by mouth daily.   cephALEXin 500 MG capsule Commonly known as: KEFLEX Take 1 capsule (500 mg total) by mouth 4 (four) times daily for 5 days.   cholecalciferol 1000 units tablet Commonly known as: VITAMIN D Take 1,000 Units by mouth once a week.   dextromethorphan-guaiFENesin 30-600 MG 12hr tablet Commonly known as: MUCINEX DM Take 1 tablet by mouth 2 (two) times daily for 7 days.   felodipine 10 MG 24 hr tablet Commonly known as: PLENDIL Take 10 mg by mouth daily.   furosemide 40 MG tablet Commonly known as: LASIX Take 1 tablet (40 mg total) by mouth daily as needed for fluid or edema.   gabapentin 100 MG capsule Commonly known as: NEURONTIN Take 100 mg by mouth 3 (three) times daily.   ibuprofen  200 MG tablet Commonly known as: ADVIL Take 200 mg by mouth every 6 (six) hours as needed for mild pain, fever or headache.   lisinopril 10 MG tablet Commonly known as: ZESTRIL Take 10 mg by mouth daily.   metroNIDAZOLE 0.75 % vaginal gel Commonly known as: METROGEL VAGINAL Use 1 applicator  in vagina at bedtime for 5 nights then has needed What changed:  how much to take how to  take this when to take this reasons to take this additional instructions   pantoprazole 40 MG tablet Commonly known as: PROTONIX Take 1 tablet (40 mg total) by mouth daily before breakfast.   polyethylene glycol 17 g packet Commonly known as: MIRALAX / GLYCOLAX Take 17 g by mouth daily as needed for moderate constipation.   potassium chloride SA 20 MEQ tablet Commonly known as: KLOR-CON Take 20 mEq by mouth daily.   Ring Pessary/Support Misc Place in vagina and keep in place Milex ring with support #4   traMADol 50 MG tablet Commonly known as: ULTRAM Take 1 tablet (50 mg total) by mouth every 8 (eight) hours as needed for moderate pain or severe pain. What changed: reasons to take this               Discharge Care Instructions  (From admission, onward)           Start     Ordered   09/07/20 0000  Discharge wound care:       Comments: Daily dressing changes.   09/07/20 1113            Contact information for follow-up providers     Avon Gully, MD. Schedule an appointment as soon as possible for a visit in 1 week(s).   Specialty: Internal Medicine Contact information: [address] [place] [address] [phone]              Contact information for after-discharge care     Destination     [facility] Preferred SNF .   Service: Skilled Nursing Contact information: [address] [place] [address] [phone]                    No Known Allergies  Consultations: None   Procedures/Studies: DG Chest 1 View  Result Date: 09/04/2020 CLINICAL DATA:  Pain after fall. EXAM: CHEST  1 VIEW COMPARISON:  07/30/2017 FINDINGS: Shallow inspiration. Cardiac enlargement. Bilateral perihilar masslike consolidation, greatest on the left. This is new since prior study and probably represents multifocal pneumonia. Follow-up to resolution recommended to exclude  underlying mass lesion. Suggestion of a small right pleural effusion. Calcified and tortuous aorta. Thoracolumbar degenerative changes and scoliosis. IMPRESSION: New masslike perihilar infiltrates in the lungs, likely pneumonia. Follow-up to resolution recommended to exclude underlying mass lesions. Electronically Signed   By: Burman Nieves M.D.   On: 09/04/2020 22:26   DG Forearm Left  Result Date: 09/04/2020 CLINICAL DATA:  Status post fall EXAM: LEFT FOREARM - 2 VIEW COMPARISON:  None. FINDINGS: There is no evidence of acute displaced fracture or other focal bone lesions. Degenerative changes of the left wrist. No definite acute injury of the visualized left wrist and left elbow. No definite elbow effusion. Subcutaneus soft tissue edema of the lateral mid forearm. IMPRESSION: No acute displaced fracture or dislocation of the left forearm. If concern for left wrist or left elbow fracture, please obtain dedicated views. Electronically Signed   By: Blanchie Serve  Tessie Fass M.D.   On: 09/04/2020 04:40   CT Chest Wo Contrast  Result Date: 09/04/2020 CLINICAL DATA:  Pneumonia, effusion or abscess suspected, xray done 87 year old with left leg and flank pain. Fall. Abnormal chest x-ray. EXAM: CT CHEST WITHOUT CONTRAST TECHNIQUE: Multidetector CT imaging of the chest was performed following the standard protocol without IV contrast. COMPARISON:  Radiograph earlier this day. No prior chest CT available. FINDINGS: Cardiovascular: Moderate aortic atherosclerosis. There is diffuse aortic tortuosity. Curvilinear calcifications in the descending aorta are stable from 2018 abdominal CT and likely represent calcified mural thrombus. Coronary artery calcifications. Borderline cardiomegaly. Dilated central pulmonary artery at 3.4 cm. No pericardial effusion. Mediastinum/Nodes: Assessment for mediastinal and hilar adenopathy is limited in the absence of IV contrast. There is a lobulated left upper lobe perihilar mass that is  difficult to delineate from the adjacent hilum. Enlarged 15 mm subcarinal node series 2, image 64. no gross esophageal wall thickening. No suspicious thyroid nodule. Lungs/Pleura: Left upper lobe perihilar masslike opacity. This is contiguous with the hilum centrally making measurements difficult, however representative measurement of 4.3 x 2.7 x 3.7 cm, series 4, image 40 and series 5, image 66. There a few central calcifications. There is surrounding spiculation and ground-glass opacity extending along the fissure towards the apex as well as anterior inferiorly. This causes luminal narrowing of the left upper lobe bronchus. Linear and irregular opacities in the lingula are typical of atelectasis. Small left pleural effusion. Left basilar opacities likely combination of compressive and subsegmental atelectasis. There is left lower lobe bronchial thickening. Small right pleural effusion. Adjacent right lower lobe atelectasis and subsegmental opacity. Right-sided bronchial thickening, severe in the lower lobes with areas of mucous plugging and mucoid impaction. There is mild ground-glass opacity in the dependent and perifissural right upper lobe. Upper Abdomen: Assessed on concurrent abdominal CT, reported separately. Musculoskeletal: T11 vertebral body and pedicles demonstrates heterogeneous trabecular coarsening, also seen on 2018 abdominal CT. Scoliosis and degenerative change throughout the thoracic spine. Advanced bilateral shoulder osteoarthritis. IMPRESSION: 1. Left upper lobe perihilar masslike opacity measuring 4.3 x 2.7 x 3.7 cm. This is contiguous with the adjacent hilum and causes luminal narrowing of the left upper lobe bronchus. Differential considerations include infection and malignancy. There are occasional central calcifications. Enlarged subcarinal node measuring 15 mm, nonspecific. If tissue sampling is not pursued, recommend close radiologic follow-up after course of treatment. 2. Small left and  small right pleural effusions. Bilateral lower lobe bronchial thickening with areas of mucous plugging and mucoid impaction. 3. Aortic atherosclerosis. Coronary artery calcifications. Dilated central pulmonary artery consistent with pulmonary arterial hypertension. 4. Heterogeneous trabecular coarsening of T11 vertebral body and pedicles, also seen on 2018 abdominal CT. This may be secondary to hemangioma or Paget's disease, but is nonspecific. Stability suggests a nonaggressive etiology. Aortic Atherosclerosis (ICD10-I70.0). Electronically Signed   By: Narda Rutherford M.D.   On: 09/04/2020 23:43   DG Knee Complete 4 Views Left  Result Date: 09/05/2020 CLINICAL DATA:  Knee pain.  Fall. EXAM: LEFT KNEE - COMPLETE 4+ VIEW COMPARISON:  None. FINDINGS: No fracture or dislocation. Advanced tibiofemoral osteoarthritis, more prominent in the lateral compartment. Downsloping of the posterolateral tibia. Mild patellofemoral osteoarthritis. There is a small knee joint effusion. The bones are diffusely under mineralized. Mild soft tissue edema. IMPRESSION: 1. No fracture or dislocation of the left knee. 2. Advanced tibiofemoral osteoarthritis, more prominent in the lateral compartment. Small knee joint effusion. 3. Osteopenia/osteoporosis. Electronically Signed   By: Ivette Loyal.D.  On: 09/05/2020 00:00   DG Knee Complete 4 Views Right  Result Date: 09/04/2020 CLINICAL DATA:  Pain after a fall EXAM: RIGHT KNEE - COMPLETE 4+ VIEW COMPARISON:  07/30/2017 FINDINGS: Diffuse bone demineralization. No evidence of acute fracture or dislocation. Bipartite patella. No focal bone lesion or bone destruction. Bone cortex appears intact. No significant effusions. Soft tissues are unremarkable. IMPRESSION: No acute fracture or dislocation. Electronically Signed   By: Burman Nieves M.D.   On: 09/04/2020 22:28   CT Renal Stone Study  Result Date: 09/04/2020 CLINICAL DATA:  Left flank and left leg pain. EXAM: CT ABDOMEN  AND PELVIS WITHOUT CONTRAST TECHNIQUE: Multidetector CT imaging of the abdomen and pelvis was performed following the standard protocol without IV contrast. COMPARISON:  Noncontrast CT 02/05/2017 FINDINGS: Lower chest: Assessed on concurrent chest CT, reported separately. Hepatobiliary: Chronic left lobe atrophy, biliary dilatation and pneumobilia. No discrete focal hepatic lesion. Cholecystectomy. Pancreas: Grossly normal. No evidence of peripancreatic inflammation. Spleen: Small in size.  No evidence of focal splenic abnormality. Adrenals/Urinary Tract: Left adrenal thickening without dominant nodule. No right adrenal nodule. There is mild bilateral renal parenchymal thinning. No hydronephrosis or renal calculi. No perinephric edema. Urinary bladder is unremarkable, partially obscured by streak artifact from right hip arthroplasty. Stomach/Bowel: Bowel assessment is limited in the absence of enteric contrast and paucity of intra-abdominal fat. Decompressed stomach. There is no small bowel obstruction or inflammation. Appendectomy. Scattered left colonic diverticulosis without diverticulitis. Transverse colon is redundant. Vascular/Lymphatic: Advanced aortic atherosclerosis. No aneurysm. No obvious abdominopelvic adenopathy, allowing for limitations related to lack of contrast. Reproductive: Pessary in place. Uterus tentatively visualized deviating into the right pelvis, obscured by streak artifact from right hip arthroplasty. No evidence of adnexal mass, with limited right adnexal assessment. Other: Umbilical hernia contains fat with mild stranding. No bowel involvement. No abdominopelvic ascites. No free air. There is mild bilateral flank soft tissue edema. Musculoskeletal: Right hip arthroplasty. Lumbar scoliosis and degenerative change. No acute osseous abnormalities are seen. IMPRESSION: 1. No renal stones or obstructive uropathy. 2. Umbilical hernia contains fat with mild stranding. No bowel involvement.  Recommend correlation for any hernia pain. 3. Post cholecystectomy with chronic pneumobilia and atrophy of the left hepatic lobe. 4. Colonic diverticulosis without diverticulitis. Aortic Atherosclerosis (ICD10-I70.0). Electronically Signed   By: Narda Rutherford M.D.   On: 09/04/2020 23:49   DG Hip Unilat With Pelvis 2-3 Views Left  Result Date: 09/04/2020 CLINICAL DATA:  Left hip pain after a fall. EXAM: DG HIP (WITH OR WITHOUT PELVIS) 2-3V LEFT COMPARISON:  None. FINDINGS: Degenerative changes in the lower lumbar spine and left hip. Previous right hip hemiarthroplasty. Left hip is rotated, limiting evaluation of the femoral neck. No acute fracture or dislocation is identified. No focal bone lesions or bone destruction. Pelvis appears intact. SI joints and symphysis pubis are not displaced. Prominent vascular calcifications. IMPRESSION: No acute bony abnormalities. Degenerative changes in the lower lumbar spine and left hip. Previous right hip hemiarthroplasty. Electronically Signed   By: Burman Nieves M.D.   On: 09/04/2020 22:25     Discharge Exam: Vitals:   09/07/20 2102 09/08/20 0451  BP: 119/77 114/63  Pulse: 95 89  Resp: 16 16  Temp: 98.5 F (36.9 C) 97.8 F (36.6 C)  SpO2: 96% 92%   Vitals:   09/07/20 0441 09/07/20 1402 09/07/20 2102 09/08/20 0451  BP: 121/64 133/67 119/77 114/63  Pulse: 87 86 95 89  Resp: Temp: 98.7 F (37.1 C)  98.6 F (37 C) 98.5 F (36.9 C) 97.8 F (36.6 C)  TempSrc:  Oral Oral Oral  SpO2: 91% 91% 96% 92%  Weight:      Height:        General: Pt is alert, awake, not in acute distress Cardiovascular: RRR, S1/S2 +, no rubs, no gallops Respiratory: CTA bilaterally, no wheezing, no rhonchi, currently on nasal cannula oxygen Abdominal: Soft, NT, ND, bowel sounds + Extremities: no edema, no cyanosis    The results of significant diagnostics from this hospitalization (including imaging, microbiology, ancillary and laboratory) are listed  below for reference.     Microbiology: Recent Results (from the past 240 hour(s))  Culture, blood (routine x 2)     Status: None (Preliminary result)   Collection Time: 09/04/20 11:09 PM   Specimen: Right Antecubital; Blood  Result Value Ref Range Status   Specimen Description RIGHT ANTECUBITAL  Final   Special Requests   Final    BOTTLES DRAWN AEROBIC AND ANAEROBIC Blood Culture adequate volume   Culture   Final    NO GROWTH 3 DAYS Performed at [facility], [address] [place], [place] [address]    Report Status PENDING  Incomplete  Culture, blood (routine x 2)     Status: None (Preliminary result)   Collection Time: 09/04/20 11:09 PM   Specimen: BLOOD LEFT HAND  Result Value Ref Range Status   Specimen Description BLOOD LEFT HAND  Final   Special Requests   Final    BOTTLES DRAWN AEROBIC ONLY Blood Culture adequate volume   Culture   Final    NO GROWTH 3 DAYS Performed at [facility], [address] [place], [place] [address]    Report Status PENDING  Incomplete  Resp Panel by RT-PCR (Flu A&B, Covid) Nasopharyngeal Swab     Status: None   Collection Time: 09/05/20 12:41 AM   Specimen: Nasopharyngeal Swab; Nasopharyngeal(NP) swabs in vial transport medium  Result Value Ref Range Status   SARS Coronavirus 2 by RT PCR NEGATIVE NEGATIVE Final    Comment: (NOTE) SARS-CoV-2 target nucleic acids are NOT DETECTED.  The SARS-CoV-2 RNA is generally detectable in upper respiratory specimens during the acute phase of infection. The lowest concentration of SARS-CoV-2 viral copies this assay can detect is 138 copies/mL. A negative result does not preclude SARS-Cov-2 infection and should not be used as the sole basis for treatment or other patient management decisions. A negative result may occur with  improper specimen collection/handling, submission of specimen other than nasopharyngeal swab, presence of viral mutation(s) within the areas targeted by this assay, and inadequate  number of viral copies(<138 copies/mL). A negative result must be combined with clinical observations, patient history, and epidemiological information. The expected result is Negative.  Fact Sheet for Patients:  BloggerCourse.com  Fact Sheet for Healthcare Providers:  SeriousBroker.it  This test is no t yet approved or cleared by the [place] FDA and  has been authorized for detection and/or diagnosis of SARS-CoV-2 by FDA under an Emergency Use Authorization (EUA). This EUA will remain  in effect (meaning this test can be used) for the duration of the COVID-19 declaration under Section 564(b)(1) of the Act, 21 U.S.C.section 360bbb-3(b)(1), unless the authorization is terminated  or revoked sooner.       Influenza A by PCR NEGATIVE NEGATIVE Final   Influenza B by PCR NEGATIVE NEGATIVE Final    Comment: (NOTE) The Xpert Xpress SARS-CoV-2/FLU/RSV plus assay is intended as an aid in the diagnosis  of influenza from Nasopharyngeal swab specimens and should not be used as a sole basis for treatment. Nasal washings and aspirates are unacceptable for Xpert Xpress SARS-CoV-2/FLU/RSV testing.  Fact Sheet for Patients: BloggerCourse.comhttps://www.fda.gov/media/152166/download  Fact Sheet for Healthcare Providers: SeriousBroker.ithttps://www.fda.gov/media/152162/download  This test is not yet approved or cleared by the [place] FDA and has been authorized for detection and/or diagnosis of SARS-CoV-2 by FDA under an Emergency Use Authorization (EUA). This EUA will remain in effect (meaning this test can be used) for the duration of the COVID-19 declaration under Section 564(b)(1) of the Act, 21 U.S.C. section 360bbb-3(b)(1), unless the authorization is terminated or revoked.  Performed at [facility], [address] [place], [place] [address]   MRSA Next Gen by PCR, Nasal     Status: None   Collection Time: 09/05/20  2:15 AM   Specimen: Nasal Mucosa;  Nasal Swab  Result Value Ref Range Status   MRSA by PCR Next Gen NOT DETECTED NOT DETECTED Final    Comment: (NOTE) The GeneXpert MRSA Assay (FDA approved for NASAL specimens only), is one component of a comprehensive MRSA colonization surveillance program. It is not intended to diagnose MRSA infection nor to guide or monitor treatment for MRSA infections. Test performance is not FDA approved in patients less than 85 years old. Performed at [facility], [address] [place], [place] [address]   Urine Culture     Status: Abnormal   Collection Time: 09/05/20  6:55 AM   Specimen: Urine, Random  Result Value Ref Range Status   Specimen Description   Final    URINE, RANDOM Performed at [facility], [address] [place], [place] [address]    Special Requests   Final    NONE Performed at [facility], [address] [place], [place] [address]    Culture (A)  Final    <10,000 COLONIES/mL INSIGNIFICANT GROWTH Performed at [facility], [address] [place], [place] [address]    Report Status 09/06/2020 FINAL  Final  Resp Panel by RT-PCR (Flu A&B, Covid) Nasopharyngeal Swab     Status: None   Collection Time: 09/07/20  2:05 PM   Specimen: Nasopharyngeal Swab; Nasopharyngeal(NP) swabs in vial transport medium  Result Value Ref Range Status   SARS Coronavirus 2 by RT PCR NEGATIVE NEGATIVE Final    Comment: (NOTE) SARS-CoV-2 target nucleic acids are NOT DETECTED.  The SARS-CoV-2 RNA is generally detectable in upper respiratory specimens during the acute phase of infection. The lowest concentration of SARS-CoV-2 viral copies this assay can detect is 138 copies/mL. A negative result does not preclude SARS-Cov-2 infection and should not be used as the sole basis for treatment or other patient management decisions. A negative result may occur with  improper specimen collection/handling, submission of specimen other than nasopharyngeal swab, presence of viral mutation(s)  within the areas targeted by this assay, and inadequate number of viral copies(<138 copies/mL). A negative result must be combined with clinical observations, patient history, and epidemiological information. The expected result is Negative.  Fact Sheet for Patients:  BloggerCourse.comhttps://www.fda.gov/media/152166/download  Fact Sheet for Healthcare Providers:  SeriousBroker.ithttps://www.fda.gov/media/152162/download  This test is no t yet approved or cleared by the [place] FDA and  has been authorized for detection and/or diagnosis of SARS-CoV-2 by FDA under an Emergency Use Authorization (EUA). This EUA will remain  in effect (meaning this test can be used) for the duration of the COVID-19 declaration under Section 564(b)(1) of the Act, 21 U.S.C.section 360bbb-3(b)(1), unless the  authorization is terminated  or revoked sooner.       Influenza A by PCR NEGATIVE NEGATIVE Final   Influenza B by PCR NEGATIVE NEGATIVE Final    Comment: (NOTE) The Xpert Xpress SARS-CoV-2/FLU/RSV plus assay is intended as an aid in the diagnosis of influenza from Nasopharyngeal swab specimens and should not be used as a sole basis for treatment. Nasal washings and aspirates are unacceptable for Xpert Xpress SARS-CoV-2/FLU/RSV testing.  Fact Sheet for Patients: BloggerCourse.com  Fact Sheet for Healthcare Providers: SeriousBroker.it  This test is not yet approved or cleared by the [place] FDA and has been authorized for detection and/or diagnosis of SARS-CoV-2 by FDA under an Emergency Use Authorization (EUA). This EUA will remain in effect (meaning this test can be used) for the duration of the COVID-19 declaration under Section 564(b)(1) of the Act, 21 U.S.C. section 360bbb-3(b)(1), unless the authorization is terminated or revoked.  Performed at [facility], [address], [place], [place] [address]      Labs: BNP (last 3 results) No results for  input(s): BNP in the last 8760 hours. Basic Metabolic Panel: Recent Labs  Lab 09/04/20 2207 09/05/20 0416 09/06/20 0428 09/07/20 0452 09/07/20 0820  NA 137 139 138 138 137  K 4.8 4.7 4.5 5.8* 4.9  CL 109 110 108 108 107  CO2 20* 21* 22 21* 20*  GLUCOSE 111* 101* 102* 89 86  BUN 20 19 21 22 22   CREATININE 1.54* 1.55* 1.40* 1.32* 1.36*  CALCIUM 8.2* 8.0* 8.3* 8.8* 8.6*  MG  --  2.1 2.1 2.4  --   PHOS  --  2.9  --   --   --    Liver Function Tests: Recent Labs  Lab 09/05/20 0416 09/06/20 0428  AST 43* 34  ALT 23 19  ALKPHOS 78 69  BILITOT 1.1 0.8  PROT 6.2* 6.0*  ALBUMIN 3.1* 3.0*   No results for input(s): LIPASE, AMYLASE in the last 168 hours. No results for input(s): AMMONIA in the last 168 hours. CBC: Recent Labs  Lab 09/04/20 2207 09/05/20 0416 09/06/20 0428 09/07/20 0452  WBC 13.9* 13.3* 13.8* 13.2*  NEUTROABS 11.2*  --   --   --   HGB 12.9 12.6 11.6* 11.6*  HCT 39.2 38.7 35.5* 35.8*  MCV 88.9 88.8 88.3 88.6  PLT 143* 133* 132* 144*   Cardiac Enzymes: No results for input(s): CKTOTAL, CKMB, CKMBINDEX, TROPONINI in the last 168 hours. BNP: Invalid input(s): POCBNP CBG: Recent Labs  Lab 09/06/20 0734 09/06/20 2133  GLUCAP 92 86   D-Dimer No results for input(s): DDIMER in the last 72 hours. Hgb A1c No results for input(s): HGBA1C in the last 72 hours. Lipid Profile No results for input(s): CHOL, HDL, LDLCALC, TRIG, CHOLHDL, LDLDIRECT in the last 72 hours. Thyroid function studies No results for input(s): TSH, T4TOTAL, T3FREE, THYROIDAB in the last 72 hours.  Invalid input(s): FREET3 Anemia work up No results for input(s): VITAMINB12, FOLATE, FERRITIN, TIBC, IRON, RETICCTPCT in the last 72 hours. Urinalysis    Component Value Date/Time   COLORURINE YELLOW 09/05/2020 0655   APPEARANCEUR CLEAR 09/05/2020 0655   LABSPEC 1.034 (H) 09/05/2020 0655   PHURINE 9.0 (H) 09/05/2020 0655   GLUCOSEU NEGATIVE 09/05/2020 0655   HGBUR NEGATIVE 09/05/2020  0655   HGBUR negative 08/04/2007 1029   BILIRUBINUR NEGATIVE 09/05/2020 0655   KETONESUR NEGATIVE 09/05/2020 0655   PROTEINUR NEGATIVE 09/05/2020 0655   UROBILINOGEN 2.0 (H) 11/01/2014 1830   NITRITE NEGATIVE 09/05/2020  4098   LEUKOCYTESUR NEGATIVE 09/05/2020 0655   Sepsis Labs Invalid input(s): PROCALCITONIN,  WBC,  LACTICIDVEN Microbiology Recent Results (from the past 240 hour(s))  Culture, blood (routine x 2)     Status: None (Preliminary result)   Collection Time: 09/04/20 11:09 PM   Specimen: Right Antecubital; Blood  Result Value Ref Range Status   Specimen Description RIGHT ANTECUBITAL  Final   Special Requests   Final    BOTTLES DRAWN AEROBIC AND ANAEROBIC Blood Culture adequate volume   Culture   Final    NO GROWTH 3 DAYS Performed at [facility], [address], [place], [place] [address]    Report Status PENDING  Incomplete  Culture, blood (routine x 2)     Status: None (Preliminary result)   Collection Time: 09/04/20 11:09 PM   Specimen: BLOOD LEFT HAND  Result Value Ref Range Status   Specimen Description BLOOD LEFT HAND  Final   Special Requests   Final    BOTTLES DRAWN AEROBIC ONLY Blood Culture adequate volume   Culture   Final    NO GROWTH 3 DAYS Performed at [facility], [address] [place], [place] [address]    Report Status PENDING  Incomplete  Resp Panel by RT-PCR (Flu A&B, Covid) Nasopharyngeal Swab     Status: None   Collection Time: 09/05/20 12:41 AM   Specimen: Nasopharyngeal Swab; Nasopharyngeal(NP) swabs in vial transport medium  Result Value Ref Range Status   SARS Coronavirus 2 by RT PCR NEGATIVE NEGATIVE Final    Comment: (NOTE) SARS-CoV-2 target nucleic acids are NOT DETECTED.  The SARS-CoV-2 RNA is generally detectable in upper respiratory specimens during the acute phase of infection. The lowest concentration of SARS-CoV-2 viral copies this assay can detect is 138 copies/mL. A negative result does not preclude SARS-Cov-2 infection  and should not be used as the sole basis for treatment or other patient management decisions. A negative result may occur with  improper specimen collection/handling, submission of specimen other than nasopharyngeal swab, presence of viral mutation(s) within the areas targeted by this assay, and inadequate number of viral copies(<138 copies/mL). A negative result must be combined with clinical observations, patient history, and epidemiological information. The expected result is Negative.  Fact Sheet for Patients:  BloggerCourse.com  Fact Sheet for Healthcare Providers:  SeriousBroker.it  This test is no t yet approved or cleared by the [place] FDA and  has been authorized for detection and/or diagnosis of SARS-CoV-2 by FDA under an Emergency Use Authorization (EUA). This EUA will remain  in effect (meaning this test can be used) for the duration of the COVID-19 declaration under Section 564(b)(1) of the Act, 21 U.S.C.section 360bbb-3(b)(1), unless the authorization is terminated  or revoked sooner.       Influenza A by PCR NEGATIVE NEGATIVE Final   Influenza B by PCR NEGATIVE NEGATIVE Final    Comment: (NOTE) The Xpert Xpress SARS-CoV-2/FLU/RSV plus assay is intended as an aid in the diagnosis of influenza from Nasopharyngeal swab specimens and should not be used as a sole basis for treatment. Nasal washings and aspirates are unacceptable for Xpert Xpress SARS-CoV-2/FLU/RSV testing.  Fact Sheet for Patients: BloggerCourse.com  Fact Sheet for Healthcare Providers: SeriousBroker.it  This test is not yet approved or cleared by the [place] FDA and has been authorized for detection and/or diagnosis of SARS-CoV-2 by FDA under an Emergency Use Authorization (EUA). This EUA will remain in effect (meaning this test can be used) for the duration of  the COVID-19 declaration  under Section 564(b)(1) of the Act, 21 U.S.C. section 360bbb-3(b)(1), unless the authorization is terminated or revoked.  Performed at [facility], [address], [place], [place] [address]   MRSA Next Gen by PCR, Nasal     Status: None   Collection Time: 09/05/20  2:15 AM   Specimen: Nasal Mucosa; Nasal Swab  Result Value Ref Range Status   MRSA by PCR Next Gen NOT DETECTED NOT DETECTED Final    Comment: (NOTE) The GeneXpert MRSA Assay (FDA approved for NASAL specimens only), is one component of a comprehensive MRSA colonization surveillance program. It is not intended to diagnose MRSA infection nor to guide or monitor treatment for MRSA infections. Test performance is not FDA approved in patients less than 34 years old. Performed at [facility], [address] [place], [place] [address]   Urine Culture     Status: Abnormal   Collection Time: 09/05/20  6:55 AM   Specimen: Urine, Random  Result Value Ref Range Status   Specimen Description   Final    URINE, RANDOM Performed at [facility], [address], [place], [place] [address]    Special Requests   Final    NONE Performed at [facility], [address], [place], [place] [address]    Culture (A)  Final    <10,000 COLONIES/mL INSIGNIFICANT GROWTH Performed at [facility], [address] [place], [place] [address]    Report Status 09/06/2020 FINAL  Final  Resp Panel by RT-PCR (Flu A&B, Covid) Nasopharyngeal Swab     Status: None   Collection Time: 09/07/20  2:05 PM   Specimen: Nasopharyngeal Swab; Nasopharyngeal(NP) swabs in vial transport medium  Result Value Ref Range Status   SARS Coronavirus 2 by RT PCR NEGATIVE NEGATIVE Final    Comment: (NOTE) SARS-CoV-2 target nucleic acids are NOT DETECTED.  The SARS-CoV-2 RNA is generally detectable in upper respiratory specimens during the acute phase of infection. The lowest concentration of SARS-CoV-2 viral copies this assay can detect is 138 copies/mL. A  negative result does not preclude SARS-Cov-2 infection and should not be used as the sole basis for treatment or other patient management decisions. A negative result may occur with  improper specimen collection/handling, submission of specimen other than nasopharyngeal swab, presence of viral mutation(s) within the areas targeted by this assay, and inadequate number of viral copies(<138 copies/mL). A negative result must be combined with clinical observations, patient history, and epidemiological information. The expected result is Negative.  Fact Sheet for Patients:  BloggerCourse.com  Fact Sheet for Healthcare Providers:  SeriousBroker.it  This test is no t yet approved or cleared by the [place] FDA and  has been authorized for detection and/or diagnosis of SARS-CoV-2 by FDA under an Emergency Use Authorization (EUA). This EUA will remain  in effect (meaning this test can be used) for the duration of the COVID-19 declaration under Section 564(b)(1) of the Act, 21 U.S.C.section 360bbb-3(b)(1), unless the authorization is terminated  or revoked sooner.       Influenza A by PCR NEGATIVE NEGATIVE Final   Influenza B by PCR NEGATIVE NEGATIVE Final    Comment: (NOTE) The Xpert Xpress SARS-CoV-2/FLU/RSV plus assay is intended as an aid in the diagnosis of influenza from Nasopharyngeal swab specimens and should not be used as a sole basis for treatment. Nasal washings and aspirates are unacceptable for Xpert Xpress SARS-CoV-2/FLU/RSV testing.  Fact Sheet for Patients: BloggerCourse.com  Fact Sheet for Healthcare Providers: SeriousBroker.it  This test is not yet approved or cleared by the [place] and has been authorized for detection and/or diagnosis of SARS-CoV-2 by FDA under an Emergency Use Authorization (EUA). This EUA will remain in effect (meaning this test can  be used) for the duration of the COVID-19 declaration under Section 564(b)(1) of the Act, 21 U.S.C. section 360bbb-3(b)(1), unless the authorization is terminated or revoked.  Performed at [facility], [address], [place], [place] [address]      Time coordinating discharge: 35 minutes  SIGNED:   Erick Blinks, DO [facility] 09/08/2020, 9:29 AM  If 7PM-7AM, please contact night-coverage [email or web address]

## 2020-09-08 DIAGNOSIS — Z23 Encounter for immunization: Secondary | ICD-10-CM | POA: Diagnosis not present

## 2020-09-08 DIAGNOSIS — R531 Weakness: Secondary | ICD-10-CM | POA: Diagnosis not present

## 2020-09-08 DIAGNOSIS — M6281 Muscle weakness (generalized): Secondary | ICD-10-CM | POA: Diagnosis not present

## 2020-09-08 DIAGNOSIS — R2689 Other abnormalities of gait and mobility: Secondary | ICD-10-CM | POA: Diagnosis not present

## 2020-09-08 DIAGNOSIS — J9601 Acute respiratory failure with hypoxia: Secondary | ICD-10-CM | POA: Diagnosis not present

## 2020-09-08 DIAGNOSIS — R404 Transient alteration of awareness: Secondary | ICD-10-CM | POA: Diagnosis not present

## 2020-09-08 DIAGNOSIS — R1310 Dysphagia, unspecified: Secondary | ICD-10-CM | POA: Diagnosis not present

## 2020-09-08 DIAGNOSIS — R059 Cough, unspecified: Secondary | ICD-10-CM | POA: Diagnosis not present

## 2020-09-08 DIAGNOSIS — M172 Bilateral post-traumatic osteoarthritis of knee: Secondary | ICD-10-CM | POA: Diagnosis not present

## 2020-09-08 DIAGNOSIS — J189 Pneumonia, unspecified organism: Secondary | ICD-10-CM | POA: Diagnosis not present

## 2020-09-08 DIAGNOSIS — Z7401 Bed confinement status: Secondary | ICD-10-CM | POA: Diagnosis not present

## 2020-09-08 DIAGNOSIS — Z743 Need for continuous supervision: Secondary | ICD-10-CM | POA: Diagnosis not present

## 2020-09-08 DIAGNOSIS — I10 Essential (primary) hypertension: Secondary | ICD-10-CM | POA: Diagnosis not present

## 2020-09-08 DIAGNOSIS — I5032 Chronic diastolic (congestive) heart failure: Secondary | ICD-10-CM | POA: Diagnosis not present

## 2020-09-08 DIAGNOSIS — R279 Unspecified lack of coordination: Secondary | ICD-10-CM | POA: Diagnosis not present

## 2020-09-08 NOTE — TOC Transition Note (Signed)
Transition of Care [facility]) - CM/SW Discharge Note   Patient Details  Name: Lori Bishop MRN: [id number] Date of Birth: 28-Apr-1920  [facility] CM/SW Contact:  Karn Cassis, LCSW Phone Number: 09/08/2020, 2:23 PM   Clinical Narrative:   Pt d/c today to [facility]. Pt's niece, Bosie Helper aware and agreeable. Requests transport by [facility]. D/C summary sent to SNF. COVID negative 09/07/20. Authorization received. RN given number to call report.     Final next level of care: Skilled Nursing Facility Barriers to Discharge: Barriers Resolved   Patient Goals and CMS Choice Patient states their goals for this hospitalization and ongoing recovery are:: Go to SNF CMS Medicare.gov Compare Post Acute Care list provided to:: Patient Represenative (must comment) (niece/POA) Choice offered to / list presented to : [facility] POA / Guardian  Discharge Placement              Patient chooses bed at: Other - please specify in the comment section below: ([facility]) Patient to be transferred to facility by: [facility] Name of family member notified: Bosie Helper Patient and family notified of of transfer: 09/08/20  Discharge Plan and Services In-house Referral: Clinical Social Work Discharge Planning Services: CM Consult Post Acute Care Choice: Skilled Nursing Facility                               Social Determinants of Health (SDOH) Interventions     Readmission Risk Interventions Readmission Risk Prevention Plan 09/06/2020  Medication Screening Complete  Transportation Screening Complete  Some recent data might be hidden

## 2020-09-08 NOTE — Care Management Important Message (Signed)
Important Message  Patient Details  Name: Lori Bishop MRN: [id number] Date of Birth: 09/09/1920   Medicare Important Message Given:  Yes     Corey Harold 09/08/2020, 1:29 PM

## 2020-09-08 NOTE — Progress Notes (Signed)
Patient seen and evaluated this a.m. with no new complaints or concerns noted overnight.  She is in stable condition for discharge today to [facility] rehab facility.  COVID testing has returned negative.  Please refer to discharge summary dictated 7/6 for full details.  Total care time: 20 minutes.

## 2020-09-09 LAB — CULTURE, BLOOD (ROUTINE X 2)
Culture: NO GROWTH
Culture: NO GROWTH
Special Requests: ADEQUATE
Special Requests: ADEQUATE

## 2020-09-11 DIAGNOSIS — I10 Essential (primary) hypertension: Secondary | ICD-10-CM | POA: Diagnosis not present

## 2020-09-11 DIAGNOSIS — M172 Bilateral post-traumatic osteoarthritis of knee: Secondary | ICD-10-CM | POA: Diagnosis not present

## 2020-09-11 DIAGNOSIS — R531 Weakness: Secondary | ICD-10-CM | POA: Diagnosis not present

## 2020-09-18 DIAGNOSIS — R059 Cough, unspecified: Secondary | ICD-10-CM | POA: Diagnosis not present

## 2020-09-21 DIAGNOSIS — Z23 Encounter for immunization: Secondary | ICD-10-CM | POA: Diagnosis not present

## 2020-10-04 DIAGNOSIS — M6281 Muscle weakness (generalized): Secondary | ICD-10-CM | POA: Diagnosis not present

## 2020-10-04 DIAGNOSIS — J9601 Acute respiratory failure with hypoxia: Secondary | ICD-10-CM | POA: Diagnosis not present

## 2020-10-04 DIAGNOSIS — R5381 Other malaise: Secondary | ICD-10-CM | POA: Diagnosis not present

## 2020-10-04 DIAGNOSIS — R279 Unspecified lack of coordination: Secondary | ICD-10-CM | POA: Diagnosis not present

## 2020-10-04 DIAGNOSIS — R1312 Dysphagia, oropharyngeal phase: Secondary | ICD-10-CM | POA: Diagnosis not present

## 2020-10-04 DIAGNOSIS — I5032 Chronic diastolic (congestive) heart failure: Secondary | ICD-10-CM | POA: Diagnosis not present

## 2020-10-04 DIAGNOSIS — S72002A Fracture of unspecified part of neck of left femur, initial encounter for closed fracture: Secondary | ICD-10-CM | POA: Diagnosis not present

## 2020-10-06 DIAGNOSIS — M6281 Muscle weakness (generalized): Secondary | ICD-10-CM | POA: Diagnosis not present

## 2020-10-06 DIAGNOSIS — J9601 Acute respiratory failure with hypoxia: Secondary | ICD-10-CM | POA: Diagnosis not present

## 2020-10-06 DIAGNOSIS — R1312 Dysphagia, oropharyngeal phase: Secondary | ICD-10-CM | POA: Diagnosis not present

## 2020-10-06 DIAGNOSIS — I5032 Chronic diastolic (congestive) heart failure: Secondary | ICD-10-CM | POA: Diagnosis not present

## 2020-10-06 DIAGNOSIS — R279 Unspecified lack of coordination: Secondary | ICD-10-CM | POA: Diagnosis not present

## 2020-10-07 DIAGNOSIS — R1312 Dysphagia, oropharyngeal phase: Secondary | ICD-10-CM | POA: Diagnosis not present

## 2020-10-07 DIAGNOSIS — J9601 Acute respiratory failure with hypoxia: Secondary | ICD-10-CM | POA: Diagnosis not present

## 2020-10-07 DIAGNOSIS — I5032 Chronic diastolic (congestive) heart failure: Secondary | ICD-10-CM | POA: Diagnosis not present

## 2020-10-07 DIAGNOSIS — R279 Unspecified lack of coordination: Secondary | ICD-10-CM | POA: Diagnosis not present

## 2020-10-07 DIAGNOSIS — M6281 Muscle weakness (generalized): Secondary | ICD-10-CM | POA: Diagnosis not present

## 2020-10-09 DIAGNOSIS — I5032 Chronic diastolic (congestive) heart failure: Secondary | ICD-10-CM | POA: Diagnosis not present

## 2020-10-09 DIAGNOSIS — R1312 Dysphagia, oropharyngeal phase: Secondary | ICD-10-CM | POA: Diagnosis not present

## 2020-10-09 DIAGNOSIS — M6281 Muscle weakness (generalized): Secondary | ICD-10-CM | POA: Diagnosis not present

## 2020-10-09 DIAGNOSIS — J9601 Acute respiratory failure with hypoxia: Secondary | ICD-10-CM | POA: Diagnosis not present

## 2020-10-09 DIAGNOSIS — R279 Unspecified lack of coordination: Secondary | ICD-10-CM | POA: Diagnosis not present

## 2020-10-10 DIAGNOSIS — J9601 Acute respiratory failure with hypoxia: Secondary | ICD-10-CM | POA: Diagnosis not present

## 2020-10-10 DIAGNOSIS — M6281 Muscle weakness (generalized): Secondary | ICD-10-CM | POA: Diagnosis not present

## 2020-10-10 DIAGNOSIS — R1312 Dysphagia, oropharyngeal phase: Secondary | ICD-10-CM | POA: Diagnosis not present

## 2020-10-10 DIAGNOSIS — R279 Unspecified lack of coordination: Secondary | ICD-10-CM | POA: Diagnosis not present

## 2020-10-10 DIAGNOSIS — I5032 Chronic diastolic (congestive) heart failure: Secondary | ICD-10-CM | POA: Diagnosis not present

## 2020-10-11 DIAGNOSIS — M158 Other polyosteoarthritis: Secondary | ICD-10-CM | POA: Diagnosis not present

## 2020-10-11 DIAGNOSIS — S72002A Fracture of unspecified part of neck of left femur, initial encounter for closed fracture: Secondary | ICD-10-CM | POA: Diagnosis not present

## 2020-10-11 DIAGNOSIS — I5032 Chronic diastolic (congestive) heart failure: Secondary | ICD-10-CM | POA: Diagnosis not present

## 2020-10-11 DIAGNOSIS — J9601 Acute respiratory failure with hypoxia: Secondary | ICD-10-CM | POA: Diagnosis not present

## 2020-10-11 DIAGNOSIS — R1312 Dysphagia, oropharyngeal phase: Secondary | ICD-10-CM | POA: Diagnosis not present

## 2020-10-11 DIAGNOSIS — R279 Unspecified lack of coordination: Secondary | ICD-10-CM | POA: Diagnosis not present

## 2020-10-11 DIAGNOSIS — M6281 Muscle weakness (generalized): Secondary | ICD-10-CM | POA: Diagnosis not present

## 2020-10-11 DIAGNOSIS — I10 Essential (primary) hypertension: Secondary | ICD-10-CM | POA: Diagnosis not present

## 2020-10-11 DIAGNOSIS — K219 Gastro-esophageal reflux disease without esophagitis: Secondary | ICD-10-CM | POA: Diagnosis not present

## 2020-10-12 DIAGNOSIS — M6281 Muscle weakness (generalized): Secondary | ICD-10-CM | POA: Diagnosis not present

## 2020-10-12 DIAGNOSIS — R1312 Dysphagia, oropharyngeal phase: Secondary | ICD-10-CM | POA: Diagnosis not present

## 2020-10-12 DIAGNOSIS — I5032 Chronic diastolic (congestive) heart failure: Secondary | ICD-10-CM | POA: Diagnosis not present

## 2020-10-12 DIAGNOSIS — J9601 Acute respiratory failure with hypoxia: Secondary | ICD-10-CM | POA: Diagnosis not present

## 2020-10-12 DIAGNOSIS — R279 Unspecified lack of coordination: Secondary | ICD-10-CM | POA: Diagnosis not present

## 2020-10-13 DIAGNOSIS — M6281 Muscle weakness (generalized): Secondary | ICD-10-CM | POA: Diagnosis not present

## 2020-10-13 DIAGNOSIS — R279 Unspecified lack of coordination: Secondary | ICD-10-CM | POA: Diagnosis not present

## 2020-10-13 DIAGNOSIS — R1312 Dysphagia, oropharyngeal phase: Secondary | ICD-10-CM | POA: Diagnosis not present

## 2020-10-13 DIAGNOSIS — I5032 Chronic diastolic (congestive) heart failure: Secondary | ICD-10-CM | POA: Diagnosis not present

## 2020-10-13 DIAGNOSIS — J9601 Acute respiratory failure with hypoxia: Secondary | ICD-10-CM | POA: Diagnosis not present

## 2020-10-14 DIAGNOSIS — R279 Unspecified lack of coordination: Secondary | ICD-10-CM | POA: Diagnosis not present

## 2020-10-14 DIAGNOSIS — I5032 Chronic diastolic (congestive) heart failure: Secondary | ICD-10-CM | POA: Diagnosis not present

## 2020-10-14 DIAGNOSIS — R1312 Dysphagia, oropharyngeal phase: Secondary | ICD-10-CM | POA: Diagnosis not present

## 2020-10-14 DIAGNOSIS — M6281 Muscle weakness (generalized): Secondary | ICD-10-CM | POA: Diagnosis not present

## 2020-10-14 DIAGNOSIS — J9601 Acute respiratory failure with hypoxia: Secondary | ICD-10-CM | POA: Diagnosis not present

## 2020-10-15 DIAGNOSIS — R1312 Dysphagia, oropharyngeal phase: Secondary | ICD-10-CM | POA: Diagnosis not present

## 2020-10-15 DIAGNOSIS — R279 Unspecified lack of coordination: Secondary | ICD-10-CM | POA: Diagnosis not present

## 2020-10-15 DIAGNOSIS — I5032 Chronic diastolic (congestive) heart failure: Secondary | ICD-10-CM | POA: Diagnosis not present

## 2020-10-15 DIAGNOSIS — J9601 Acute respiratory failure with hypoxia: Secondary | ICD-10-CM | POA: Diagnosis not present

## 2020-10-15 DIAGNOSIS — M6281 Muscle weakness (generalized): Secondary | ICD-10-CM | POA: Diagnosis not present

## 2020-10-18 DIAGNOSIS — R279 Unspecified lack of coordination: Secondary | ICD-10-CM | POA: Diagnosis not present

## 2020-10-18 DIAGNOSIS — J9601 Acute respiratory failure with hypoxia: Secondary | ICD-10-CM | POA: Diagnosis not present

## 2020-10-18 DIAGNOSIS — M6281 Muscle weakness (generalized): Secondary | ICD-10-CM | POA: Diagnosis not present

## 2020-10-18 DIAGNOSIS — I5032 Chronic diastolic (congestive) heart failure: Secondary | ICD-10-CM | POA: Diagnosis not present

## 2020-10-18 DIAGNOSIS — R1312 Dysphagia, oropharyngeal phase: Secondary | ICD-10-CM | POA: Diagnosis not present

## 2020-10-19 DIAGNOSIS — M6281 Muscle weakness (generalized): Secondary | ICD-10-CM | POA: Diagnosis not present

## 2020-10-19 DIAGNOSIS — J9601 Acute respiratory failure with hypoxia: Secondary | ICD-10-CM | POA: Diagnosis not present

## 2020-10-19 DIAGNOSIS — R1312 Dysphagia, oropharyngeal phase: Secondary | ICD-10-CM | POA: Diagnosis not present

## 2020-10-19 DIAGNOSIS — R279 Unspecified lack of coordination: Secondary | ICD-10-CM | POA: Diagnosis not present

## 2020-10-19 DIAGNOSIS — I5032 Chronic diastolic (congestive) heart failure: Secondary | ICD-10-CM | POA: Diagnosis not present

## 2020-10-20 DIAGNOSIS — I5032 Chronic diastolic (congestive) heart failure: Secondary | ICD-10-CM | POA: Diagnosis not present

## 2020-10-20 DIAGNOSIS — R279 Unspecified lack of coordination: Secondary | ICD-10-CM | POA: Diagnosis not present

## 2020-10-20 DIAGNOSIS — R1312 Dysphagia, oropharyngeal phase: Secondary | ICD-10-CM | POA: Diagnosis not present

## 2020-10-20 DIAGNOSIS — R918 Other nonspecific abnormal finding of lung field: Secondary | ICD-10-CM | POA: Diagnosis not present

## 2020-10-20 DIAGNOSIS — M6281 Muscle weakness (generalized): Secondary | ICD-10-CM | POA: Diagnosis not present

## 2020-10-20 DIAGNOSIS — J9601 Acute respiratory failure with hypoxia: Secondary | ICD-10-CM | POA: Diagnosis not present

## 2020-10-21 DIAGNOSIS — M6281 Muscle weakness (generalized): Secondary | ICD-10-CM | POA: Diagnosis not present

## 2020-10-21 DIAGNOSIS — J9601 Acute respiratory failure with hypoxia: Secondary | ICD-10-CM | POA: Diagnosis not present

## 2020-10-21 DIAGNOSIS — I5032 Chronic diastolic (congestive) heart failure: Secondary | ICD-10-CM | POA: Diagnosis not present

## 2020-10-21 DIAGNOSIS — R279 Unspecified lack of coordination: Secondary | ICD-10-CM | POA: Diagnosis not present

## 2020-10-21 DIAGNOSIS — R1312 Dysphagia, oropharyngeal phase: Secondary | ICD-10-CM | POA: Diagnosis not present

## 2020-10-23 DIAGNOSIS — R279 Unspecified lack of coordination: Secondary | ICD-10-CM | POA: Diagnosis not present

## 2020-10-23 DIAGNOSIS — M6281 Muscle weakness (generalized): Secondary | ICD-10-CM | POA: Diagnosis not present

## 2020-10-23 DIAGNOSIS — I5032 Chronic diastolic (congestive) heart failure: Secondary | ICD-10-CM | POA: Diagnosis not present

## 2020-10-23 DIAGNOSIS — R1312 Dysphagia, oropharyngeal phase: Secondary | ICD-10-CM | POA: Diagnosis not present

## 2020-10-23 DIAGNOSIS — J9601 Acute respiratory failure with hypoxia: Secondary | ICD-10-CM | POA: Diagnosis not present

## 2020-10-24 DIAGNOSIS — M6281 Muscle weakness (generalized): Secondary | ICD-10-CM | POA: Diagnosis not present

## 2020-10-24 DIAGNOSIS — U071 COVID-19: Secondary | ICD-10-CM | POA: Diagnosis not present

## 2020-10-24 DIAGNOSIS — I5032 Chronic diastolic (congestive) heart failure: Secondary | ICD-10-CM | POA: Diagnosis not present

## 2020-10-24 DIAGNOSIS — K219 Gastro-esophageal reflux disease without esophagitis: Secondary | ICD-10-CM | POA: Diagnosis not present

## 2020-10-25 DIAGNOSIS — I5032 Chronic diastolic (congestive) heart failure: Secondary | ICD-10-CM | POA: Diagnosis not present

## 2020-10-25 DIAGNOSIS — M6281 Muscle weakness (generalized): Secondary | ICD-10-CM | POA: Diagnosis not present

## 2020-10-25 DIAGNOSIS — J9601 Acute respiratory failure with hypoxia: Secondary | ICD-10-CM | POA: Diagnosis not present

## 2020-10-25 DIAGNOSIS — R1312 Dysphagia, oropharyngeal phase: Secondary | ICD-10-CM | POA: Diagnosis not present

## 2020-10-25 DIAGNOSIS — R279 Unspecified lack of coordination: Secondary | ICD-10-CM | POA: Diagnosis not present

## 2020-10-26 DIAGNOSIS — M6281 Muscle weakness (generalized): Secondary | ICD-10-CM | POA: Diagnosis not present

## 2020-10-26 DIAGNOSIS — I5032 Chronic diastolic (congestive) heart failure: Secondary | ICD-10-CM | POA: Diagnosis not present

## 2020-10-26 DIAGNOSIS — R1312 Dysphagia, oropharyngeal phase: Secondary | ICD-10-CM | POA: Diagnosis not present

## 2020-10-26 DIAGNOSIS — R279 Unspecified lack of coordination: Secondary | ICD-10-CM | POA: Diagnosis not present

## 2020-10-26 DIAGNOSIS — J9601 Acute respiratory failure with hypoxia: Secondary | ICD-10-CM | POA: Diagnosis not present

## 2020-10-27 DIAGNOSIS — R279 Unspecified lack of coordination: Secondary | ICD-10-CM | POA: Diagnosis not present

## 2020-10-27 DIAGNOSIS — M6281 Muscle weakness (generalized): Secondary | ICD-10-CM | POA: Diagnosis not present

## 2020-10-27 DIAGNOSIS — I5032 Chronic diastolic (congestive) heart failure: Secondary | ICD-10-CM | POA: Diagnosis not present

## 2020-10-27 DIAGNOSIS — R1312 Dysphagia, oropharyngeal phase: Secondary | ICD-10-CM | POA: Diagnosis not present

## 2020-10-27 DIAGNOSIS — J9601 Acute respiratory failure with hypoxia: Secondary | ICD-10-CM | POA: Diagnosis not present

## 2020-10-28 DIAGNOSIS — I5032 Chronic diastolic (congestive) heart failure: Secondary | ICD-10-CM | POA: Diagnosis not present

## 2020-10-28 DIAGNOSIS — R279 Unspecified lack of coordination: Secondary | ICD-10-CM | POA: Diagnosis not present

## 2020-10-28 DIAGNOSIS — R1312 Dysphagia, oropharyngeal phase: Secondary | ICD-10-CM | POA: Diagnosis not present

## 2020-10-28 DIAGNOSIS — J9601 Acute respiratory failure with hypoxia: Secondary | ICD-10-CM | POA: Diagnosis not present

## 2020-10-28 DIAGNOSIS — M6281 Muscle weakness (generalized): Secondary | ICD-10-CM | POA: Diagnosis not present

## 2020-10-31 DIAGNOSIS — R1312 Dysphagia, oropharyngeal phase: Secondary | ICD-10-CM | POA: Diagnosis not present

## 2020-10-31 DIAGNOSIS — J9601 Acute respiratory failure with hypoxia: Secondary | ICD-10-CM | POA: Diagnosis not present

## 2020-10-31 DIAGNOSIS — S72002A Fracture of unspecified part of neck of left femur, initial encounter for closed fracture: Secondary | ICD-10-CM | POA: Diagnosis not present

## 2020-10-31 DIAGNOSIS — I5032 Chronic diastolic (congestive) heart failure: Secondary | ICD-10-CM | POA: Diagnosis not present

## 2020-10-31 DIAGNOSIS — R5381 Other malaise: Secondary | ICD-10-CM | POA: Diagnosis not present

## 2020-10-31 DIAGNOSIS — M6281 Muscle weakness (generalized): Secondary | ICD-10-CM | POA: Diagnosis not present

## 2020-10-31 DIAGNOSIS — R279 Unspecified lack of coordination: Secondary | ICD-10-CM | POA: Diagnosis not present

## 2020-10-31 DIAGNOSIS — K219 Gastro-esophageal reflux disease without esophagitis: Secondary | ICD-10-CM | POA: Diagnosis not present

## 2020-10-31 DIAGNOSIS — R911 Solitary pulmonary nodule: Secondary | ICD-10-CM | POA: Diagnosis not present

## 2020-10-31 DIAGNOSIS — M158 Other polyosteoarthritis: Secondary | ICD-10-CM | POA: Diagnosis not present

## 2020-12-03 DEATH — deceased

## 2020-12-06 ENCOUNTER — Ambulatory Visit: Payer: Medicare Other | Admitting: Obstetrics & Gynecology
